# Patient Record
Sex: Female | Born: 1950 | Race: White | Hispanic: No | Marital: Married | Smoking: Former smoker
Health system: Southern US, Community
[De-identification: ages and names within clinical notes are randomized; demographics above are authoritative.]

## PROBLEM LIST (undated history)

## (undated) DIAGNOSIS — M519 Unspecified thoracic, thoracolumbar and lumbosacral intervertebral disc disorder: Secondary | ICD-10-CM

## (undated) DIAGNOSIS — E785 Hyperlipidemia, unspecified: Secondary | ICD-10-CM

## (undated) DIAGNOSIS — Z641 Problems related to multiparity: Secondary | ICD-10-CM

## (undated) DIAGNOSIS — Z87828 Personal history of other (healed) physical injury and trauma: Secondary | ICD-10-CM

## (undated) DIAGNOSIS — N289 Disorder of kidney and ureter, unspecified: Secondary | ICD-10-CM

## (undated) DIAGNOSIS — M629 Disorder of muscle, unspecified: Secondary | ICD-10-CM

## (undated) DIAGNOSIS — F3289 Other specified depressive episodes: Secondary | ICD-10-CM

## (undated) DIAGNOSIS — H9190 Unspecified hearing loss, unspecified ear: Secondary | ICD-10-CM

## (undated) DIAGNOSIS — C801 Malignant (primary) neoplasm, unspecified: Secondary | ICD-10-CM

## (undated) DIAGNOSIS — I10 Essential (primary) hypertension: Secondary | ICD-10-CM

## (undated) DIAGNOSIS — N939 Abnormal uterine and vaginal bleeding, unspecified: Secondary | ICD-10-CM

## (undated) DIAGNOSIS — T148XXA Other injury of unspecified body region, initial encounter: Secondary | ICD-10-CM

## (undated) DIAGNOSIS — K319 Disease of stomach and duodenum, unspecified: Secondary | ICD-10-CM

## (undated) DIAGNOSIS — M8430XA Stress fracture, unspecified site, initial encounter for fracture: Secondary | ICD-10-CM

## (undated) DIAGNOSIS — F411 Generalized anxiety disorder: Secondary | ICD-10-CM

## (undated) DIAGNOSIS — Z5189 Encounter for other specified aftercare: Secondary | ICD-10-CM

## (undated) DIAGNOSIS — M159 Polyosteoarthritis, unspecified: Secondary | ICD-10-CM

## (undated) DIAGNOSIS — M810 Age-related osteoporosis without current pathological fracture: Secondary | ICD-10-CM

## (undated) DIAGNOSIS — M2550 Pain in unspecified joint: Secondary | ICD-10-CM

## (undated) DIAGNOSIS — K759 Inflammatory liver disease, unspecified: Secondary | ICD-10-CM

## (undated) DIAGNOSIS — F419 Anxiety disorder, unspecified: Secondary | ICD-10-CM

## (undated) DIAGNOSIS — I519 Heart disease, unspecified: Secondary | ICD-10-CM

## (undated) DIAGNOSIS — M47816 Spondylosis without myelopathy or radiculopathy, lumbar region: Secondary | ICD-10-CM

## (undated) DIAGNOSIS — D0361 Melanoma in situ of right upper limb, including shoulder: Secondary | ICD-10-CM

## (undated) DIAGNOSIS — F329 Major depressive disorder, single episode, unspecified: Secondary | ICD-10-CM

## (undated) DIAGNOSIS — H269 Unspecified cataract: Secondary | ICD-10-CM

## (undated) DIAGNOSIS — C439 Malignant melanoma of skin, unspecified: Secondary | ICD-10-CM

## (undated) DIAGNOSIS — T7840XA Allergy, unspecified, initial encounter: Secondary | ICD-10-CM

## (undated) DIAGNOSIS — N6001 Solitary cyst of right breast: Secondary | ICD-10-CM

## (undated) DIAGNOSIS — K635 Polyp of colon: Secondary | ICD-10-CM

## (undated) DIAGNOSIS — F32A Depression, unspecified: Secondary | ICD-10-CM

## (undated) DIAGNOSIS — J432 Centrilobular emphysema: Secondary | ICD-10-CM

## (undated) DIAGNOSIS — G8929 Other chronic pain: Secondary | ICD-10-CM

## (undated) DIAGNOSIS — K760 Fatty (change of) liver, not elsewhere classified: Secondary | ICD-10-CM

## (undated) DIAGNOSIS — K834 Spasm of sphincter of Oddi: Secondary | ICD-10-CM

## (undated) DIAGNOSIS — C649 Malignant neoplasm of unspecified kidney, except renal pelvis: Secondary | ICD-10-CM

## (undated) DIAGNOSIS — M545 Low back pain, unspecified: Secondary | ICD-10-CM

## (undated) DIAGNOSIS — E119 Type 2 diabetes mellitus without complications: Secondary | ICD-10-CM

## (undated) DIAGNOSIS — C4490 Unspecified malignant neoplasm of skin, unspecified: Secondary | ICD-10-CM

## (undated) DIAGNOSIS — R928 Other abnormal and inconclusive findings on diagnostic imaging of breast: Secondary | ICD-10-CM

## (undated) DIAGNOSIS — J439 Emphysema, unspecified: Secondary | ICD-10-CM

## (undated) DIAGNOSIS — N183 Chronic kidney disease, stage 3 (moderate): Secondary | ICD-10-CM

## (undated) HISTORY — DX: Anxiety disorder, unspecified: F41.9

## (undated) HISTORY — PX: ANKLE SURGERY: SHX546

## (undated) HISTORY — DX: Chronic kidney disease, stage 3 (moderate): N18.3

## (undated) HISTORY — DX: Polyp of colon: K63.5

## (undated) HISTORY — DX: Malignant melanoma of skin, unspecified: C43.9

## (undated) HISTORY — DX: Low back pain, unspecified: M54.50

## (undated) HISTORY — DX: Age-related osteoporosis without current pathological fracture: M81.0

## (undated) HISTORY — DX: Spasm of sphincter of Oddi: K83.4

## (undated) HISTORY — DX: Type 2 diabetes mellitus without complications: E11.9

## (undated) HISTORY — DX: Malignant neoplasm of unspecified kidney, except renal pelvis: C64.9

## (undated) HISTORY — DX: Encounter for other specified aftercare: Z51.89

## (undated) HISTORY — DX: Other chronic pain: G89.29

## (undated) HISTORY — DX: Major depressive disorder, single episode, unspecified: F32.9

## (undated) HISTORY — DX: Spondylosis without myelopathy or radiculopathy, lumbar region: M47.816

## (undated) HISTORY — PX: UPPER GASTROINTESTINAL ENDOSCOPY: SHX188

## (undated) HISTORY — DX: Allergy, unspecified, initial encounter: T78.40XA

## (undated) HISTORY — PX: PARTIAL NEPHRECTOMY: SHX414

## (undated) HISTORY — DX: Unspecified malignant neoplasm of skin, unspecified: C44.90

## (undated) HISTORY — PX: ABDOMINAL HYSTERECTOMY: SHX81

## (undated) HISTORY — PX: COLONOSCOPY: SHX174

## (undated) HISTORY — DX: Other abnormal and inconclusive findings on diagnostic imaging of breast: R92.8

## (undated) HISTORY — DX: Fatty (change of) liver, not elsewhere classified: K76.0

## (undated) HISTORY — DX: Essential (primary) hypertension: I10

## (undated) HISTORY — PX: POLYPECTOMY: SHX149

## (undated) HISTORY — DX: Depression, unspecified: F32.A

## (undated) HISTORY — DX: Emphysema, unspecified: J43.9

## (undated) HISTORY — DX: Unspecified cataract: H26.9

## (undated) HISTORY — DX: Melanoma in situ of right upper limb, including shoulder: D03.61

## (undated) HISTORY — PX: FRACTURE SURGERY: SHX138

## (undated) HISTORY — PX: CARDIAC CATHETERIZATION: SHX172

## (undated) HISTORY — DX: Low back pain: M54.5

## (undated) HISTORY — DX: Centrilobular emphysema: J43.2

## (undated) HISTORY — PX: BILE DUCT STENT PLACEMENT: SHX1227

## (undated) HISTORY — PX: SPINE SURGERY: SHX786

## (undated) HISTORY — PX: CHOLECYSTECTOMY: SHX55

## (undated) HISTORY — PX: JOINT REPLACEMENT: SHX530

## (undated) HISTORY — PX: MOHS SURGERY: SUR867

## (undated) HISTORY — PX: LUMBAR FUSION: SHX111

## (undated) HISTORY — DX: Generalized anxiety disorder: F41.1

## (undated) HISTORY — PX: PR CHOLECYSTECTOMY: 47600

## (undated) HISTORY — DX: Disorder of muscle, unspecified: M62.9

## (undated) HISTORY — DX: Abnormal uterine and vaginal bleeding, unspecified: N93.9

## (undated) HISTORY — PX: PR UNLISTED PROCEDURE SPINE: 22899

## (undated) HISTORY — DX: Unspecified hearing loss, unspecified ear: H91.90

## (undated) HISTORY — DX: Hyperlipidemia, unspecified: E78.5

## (undated) HISTORY — PX: PR UNLISTED PROCEDURE LEG/ANKLE: 27899

## (undated) HISTORY — DX: Problems related to multiparity: Z64.1

## (undated) HISTORY — DX: Pain in unspecified joint: M25.50

## (undated) HISTORY — DX: Other specified depressive episodes: F32.89

## (undated) HISTORY — DX: Unspecified thoracic, thoracolumbar and lumbosacral intervertebral disc disorder: M51.9

## (undated) HISTORY — DX: Other injury of unspecified body region, initial encounter: T14.8XXA

## (undated) HISTORY — DX: Stress fracture, unspecified site, initial encounter for fracture: M84.30XA

## (undated) HISTORY — DX: Disorder of kidney and ureter, unspecified: N28.9

## (undated) HISTORY — DX: Inflammatory liver disease, unspecified: K75.9

## (undated) HISTORY — DX: Disease of stomach and duodenum, unspecified: K31.9

## (undated) HISTORY — DX: Personal history of other (healed) physical injury and trauma: Z87.828

## (undated) HISTORY — PX: MOHS ANY STAGE > 5 SPEC EACH: 17310

## (undated) HISTORY — DX: Polyosteoarthritis, unspecified: M15.9

## (undated) HISTORY — DX: Malignant (primary) neoplasm, unspecified: C80.1

## (undated) MED ORDER — TRAZODONE HCL 50 MG OR TABS
ORAL_TABLET | ORAL | Status: AC
Start: 2017-11-16 — End: ?

## (undated) MED ORDER — ALLOPURINOL 100 MG OR TABS
ORAL_TABLET | ORAL | Status: AC
Start: 2014-07-02 — End: ?

## (undated) MED ORDER — ENALAPRIL MALEATE 10 MG OR TABS
ORAL_TABLET | ORAL | Status: AC
Start: 2015-01-23 — End: ?

## (undated) MED ORDER — VENLAFAXINE HCL ER 75 MG OR CP24
EXTENDED_RELEASE_CAPSULE | ORAL | Status: AC
Start: 2015-01-23 — End: ?

## (undated) MED ORDER — BUPROPION HCL ER (SR) 150 MG OR TB12
EXTENDED_RELEASE_TABLET | ORAL | Status: AC
Start: 2014-07-02 — End: ?

## (undated) MED ORDER — BUPROPION HCL ER (SR) 150 MG OR TB12
EXTENDED_RELEASE_TABLET | ORAL | Status: AC
Start: 2015-01-23 — End: ?

## (undated) MED ORDER — SIMVASTATIN 40 MG OR TABS
ORAL_TABLET | ORAL | Status: AC
Start: 2015-01-23 — End: ?

## (undated) MED ORDER — ALLOPURINOL 100 MG OR TABS
ORAL_TABLET | ORAL | Status: AC
Start: 2015-01-23 — End: ?

## (undated) MED ORDER — TRAMADOL HCL 50 MG OR TABS
50.0000 mg | ORAL_TABLET | Freq: Four times a day (QID) | ORAL | Status: AC | PRN
Start: 2014-12-03 — End: ?

---
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
## 1994-12-21 DIAGNOSIS — D259 Leiomyoma of uterus, unspecified: Secondary | ICD-10-CM

## 1994-12-21 HISTORY — DX: Leiomyoma of uterus, unspecified: D25.9

## 1996-12-21 HISTORY — PX: PR TOTAL ABDOMINAL HYSTERECT W/WO RMVL TUBE OVARY: 58150

## 2002-12-21 HISTORY — PX: BACK SURGERY: SHX5067

## 2006-12-21 DIAGNOSIS — G4730 Sleep apnea, unspecified: Secondary | ICD-10-CM

## 2006-12-21 HISTORY — DX: Sleep apnea, unspecified: G47.30

## 2010-08-10 ENCOUNTER — Other Ambulatory Visit: Payer: Self-pay

## 2010-08-17 ENCOUNTER — Other Ambulatory Visit: Payer: Self-pay

## 2011-05-19 ENCOUNTER — Other Ambulatory Visit (INDEPENDENT_AMBULATORY_CARE_PROVIDER_SITE_OTHER): Payer: Self-pay | Admitting: Family Medicine

## 2011-05-20 LAB — PATHOLOGY, SURGICAL

## 2012-03-29 ENCOUNTER — Ambulatory Visit (INDEPENDENT_AMBULATORY_CARE_PROVIDER_SITE_OTHER): Payer: PRIVATE HEALTH INSURANCE | Admitting: Cardiovascular Disease

## 2012-03-29 ENCOUNTER — Encounter (INDEPENDENT_AMBULATORY_CARE_PROVIDER_SITE_OTHER): Payer: Self-pay | Admitting: Cardiovascular Disease

## 2012-03-29 ENCOUNTER — Other Ambulatory Visit: Payer: Self-pay

## 2012-03-29 VITALS — BP 126/74 | HR 76 | Ht 70.0 in | Wt 262.0 lb

## 2012-03-29 DIAGNOSIS — R002 Palpitations: Secondary | ICD-10-CM | POA: Insufficient documentation

## 2012-03-29 DIAGNOSIS — Z87891 Personal history of nicotine dependence: Secondary | ICD-10-CM | POA: Insufficient documentation

## 2012-03-29 DIAGNOSIS — I10 Essential (primary) hypertension: Secondary | ICD-10-CM | POA: Insufficient documentation

## 2012-03-29 DIAGNOSIS — E785 Hyperlipidemia, unspecified: Secondary | ICD-10-CM | POA: Insufficient documentation

## 2012-03-29 DIAGNOSIS — M109 Gout, unspecified: Secondary | ICD-10-CM | POA: Insufficient documentation

## 2012-03-29 DIAGNOSIS — G4733 Obstructive sleep apnea (adult) (pediatric): Secondary | ICD-10-CM | POA: Insufficient documentation

## 2012-03-29 NOTE — Progress Notes (Signed)
Cardiovascular Clinical Evaluation Note    Primary Care Provider: Rothmier, Lisbeth Ply    DOB:  August 15, 1951    CHIEF COMPLAINT: palpitations    HISTORY OF PRESENT ILLNESS:    This is a 61 year old female with multiple complaints today.    Her most recent concern has been daily palpitations. She has not noted sustained spells but they have become more frequent in the last few weeks. There is no syncope or near syncope.  She has no documentation of prior arrhythmia by any particular means.  She has not had any strokes or TIA's.  She reports her stress level is quite high with 2 grown sons in addiction treatment in [place] and raising her 69 year old daughter.  She does not drink alcohol.    Her other complaints are exertional dyspnea. She has finally stopped smoking after 40 years, but has also gained 60 pounds. She is quite winded going up stairs, although denies any chest pain.  She has no documented COPD, but has never had pulmonary testing.  She has severe obstructive sleep apnea and is on chronic CPAP for this.    Her outside labs are reviewed and demonstrate some glucose intolerance, fairly well controlled lipids and normal TSH.    The patient had been seen pre-operatively by Dr. Anner Crete for palpitations in 2009, had a normal echo and was not seen back for review of symptoms.    Cardiac Risk Factors:  treated hypertension; prediabetes; former smoker; treated hyperlipidemia; overweight; no premature family history of coronary artery disease;      Patient Active Problem List    Diagnosis Date Noted   . Palpitations 03/29/2012   . Hypertension 03/29/2012   . Hyperlipidemia 03/29/2012       MEDICATIONS:    No current outpatient prescriptions on file.       ALLERGIES:  Review of patient's allergies indicates no known allergies.      PSFH: originally from [place], relocating to [place] for many years, moving back here. Now retired. Non drinker. Remarried her high school sweetheart. Her father was the late [employer]  form [place] football.  She lost both parents int he last 2 years. Her father died of CHF, and dementia, mother died of pneumonia and renal failure.  She is quite stressed out at having to deal with their estates, etc. As well.    REVIEW OF SYSTEMS   CONSTITUTIONAL: No weight loss, fever.  CARDIOVASCULAR:  See HPI.  GI:  No melena, hematochezia, hematemesis.  GU:  No dysuria, hematuria.  NEURO:  No TIA or CVA symptoms.      PHYSICAL EXAM   CONSTITUTIONAL: This is a well-nourished female  in no distress.    BP 126/74  Pulse 76  Ht 5\' 10"  (1.778 m)  Wt 262 lb (118.842 kg)  BMI 37.59 kg/m2     NEURO/PSYCH:  Oriented x 3 with normal affect.Thyroid is not enlarged.  RESPIRATORY:  Respiratory effort is normal.  Lungs are clear to percussion/auscultation. CARDIOVASCULAR: PMI at 5th ICS/MCL.  Regular rate and rhythm.  S1, S2 normal.  No S3, S4, no murmur. Jugular venous pressure is normal.  Carotid upstrokes are brisk without bruits. Radial and brachial pulses are symmetric and normal.  Pedal pulses normal bilaterally.  Extremities no edema.  GASTROINTESTINAL: Abdomen without masses or tenderness.  No hepatosplenomegaly.  EXTREMITIES: No clubbing or cyanosis.   EKG:  NSR no ectopy  IMPRESSION:     1. Dyspnea with exertion. This  could be due to many things, but given her risk factors for CAD, this should be further assessed. She will need to start an exercise program to drop this extra weight, and must be safe to do so.  Other possibilities include excessive weight or COPD.  She will probably require PFT's at some point.  Given her sleep apnea, pulmonary hypertension is also a possibility.  2. Palpitations.  PVC's or PAC's versus atrial fibrillation for which she is also at risk.  3. Overweight syndrome.  4. Former heavy smoker, now abstinent but with significant weight gain.  5. Controlled lipids (for now)  6. Pre-diabetes.    PLAN:   1. Holter  2. Echo to assess for valve disease and pulmonary hypertension.  3. Lexiscan as  I do not think she will be able to exercise for a diagnostic result  4. See me afterwards.    [name] P. Leia Alf, MD  CC:  Rothmier, Lisbeth Ply

## 2012-03-30 NOTE — Addendum Note (Signed)
Addended by: Maia Breslow on: 03/30/2012 09:48 AM     Modules accepted: Orders

## 2012-03-30 NOTE — Progress Notes (Signed)
[facility]

## 2012-04-11 ENCOUNTER — Ambulatory Visit (HOSPITAL_BASED_OUTPATIENT_CLINIC_OR_DEPARTMENT_OTHER): Payer: PRIVATE HEALTH INSURANCE

## 2012-04-11 ENCOUNTER — Ambulatory Visit (HOSPITAL_BASED_OUTPATIENT_CLINIC_OR_DEPARTMENT_OTHER): Payer: PRIVATE HEALTH INSURANCE | Admitting: Cardiology

## 2012-04-11 VITALS — BP 132/72 | HR 69

## 2012-04-11 NOTE — Progress Notes (Signed)
Please see attached letter for myocardial perfusion imaging report

## 2012-04-11 NOTE — Progress Notes (Signed)
Echo completed.  Brenda Krauth, RDCS

## 2012-04-19 NOTE — Progress Notes (Signed)
Cardiovascular Clinical Evaluation Note    Primary Care Provider: Rothmeier, Lisbeth Ply    DOB:  May 15, 1951    CHIEF COMPLAINT: routine    HISTORY OF PRESENT ILLNESS:    The patient's echo showed normal LF function, and mild LVH. Her perfusion scan was negative for ischemia.  The results were shared with her.  Her symptoms have actually gotten somewhat better.   She and I discussed a program of exercise and incorporating Weight Watchers.  We also discussed that if dyspnea continues, she should have PFT's performed.    PROBLEM LIST:  Patient Active Problem List    Diagnosis Date Noted   . Palpitations 03/29/2012   . Hypertension 03/29/2012   . Hyperlipidemia 03/29/2012   . Gout 03/29/2012   . Former smoker 03/29/2012     A. 40 pack years     . Sleep apnea 03/29/2012     A. On CPAP chronically ( 20 mmHg)         MEDICATIONS:    Current Outpatient Prescriptions   Medication Sig   . Allopurinol 100 MG Oral Tab Take 1 tablet Daily   . ALPRAZolam 0.5 MG Oral Tab Take 1-2 tablets daily   . Aspirin 81 MG Oral Tab 1 TABLET DAILY   . Cholecalciferol (VITAMIN D3) 2000 UNITS Oral Tab Take 1 tablet Daily   . Citalopram Hydrobromide 20 MG Oral Tab Take 1 tablet Daily   . Diclofenac Sodium CR 100 MG Oral TABLET SR 24 HR Take 1 tablet Daily PRN   . Enalapril Maleate 10 MG Oral Tab Take 1/2 tablet Daily   . ESTROGEL 0.75 MG/1.25 GM (0.06%) Transdermal Gel Apply Daily   . Fiber Oral Cap Take 1 cap Daily   . Fluticasone Propionate 50 MCG/ACT Nasal Suspension Spray in each nostril daily   . Multiple Vitamin Oral Tab Take 1 tablet Daily   . Nutritional Supplements (ESTROVEN OR) daily.   . Omega-3 Fatty Acids (FISH OIL) 1000 MG Oral Cap Take 1 cap Daily   . Simvastatin 40 MG Oral Tab Take 1/2 tablet Daily   . TraZODone HCl 50 MG Oral Tab Take 1 tablet Daily PRN   . Triamterene-HCTZ 37.5-25 MG Oral Tab Take 1 tablet Daily   . Vitamin E 400 UNITS Oral Tab Take 1 tablet Daily       ALLERGIES:  Review of patient's allergies indicates no  known allergies.      PSFH: no changes    REVIEW OF SYSTEMS   CONSTITUTIONAL: No weight loss, fever.  CARDIOVASCULAR:  See HPI.  GI:  No melena, hematochezia, hematemesis.  GU:  No dysuria, hematuria.  NEURO:  No TIA or CVA symptoms.      PHYSICAL EXAM   CONSTITUTIONAL: This is a well-nourished female  in no distress.    BP 120/70  Pulse 72  Ht 5\' 10"  (1.778 m)  Wt 266 lb (120.657 kg)  BMI 38.17 kg/m2     Physical examination was deferred due to time spent counseling.     IMPRESSION:   1. Palpitations which have largely resolved. Given her structurally normal heart, these are likely benign.  2. Former smoking history and dyspnea. If ongoing symptoms, PFT's.  3. Overweight syndrome. See above.    PLAN:   RTc prn    Kandra Nicolas. Leia Alf, MD  CC:   Rothmeier, Lisbeth Ply

## 2012-04-20 ENCOUNTER — Encounter (INDEPENDENT_AMBULATORY_CARE_PROVIDER_SITE_OTHER): Payer: Self-pay | Admitting: Cardiovascular Disease

## 2012-04-20 ENCOUNTER — Ambulatory Visit (INDEPENDENT_AMBULATORY_CARE_PROVIDER_SITE_OTHER): Payer: PRIVATE HEALTH INSURANCE | Admitting: Cardiovascular Disease

## 2012-04-20 VITALS — BP 120/70 | HR 72 | Ht 70.0 in | Wt 266.0 lb

## 2012-04-20 DIAGNOSIS — R0609 Other forms of dyspnea: Secondary | ICD-10-CM | POA: Insufficient documentation

## 2013-07-03 ENCOUNTER — Other Ambulatory Visit (INDEPENDENT_AMBULATORY_CARE_PROVIDER_SITE_OTHER): Payer: Self-pay | Admitting: Family Medicine

## 2013-07-03 MED ORDER — SIMVASTATIN 40 MG OR TABS
ORAL_TABLET | ORAL | Status: DC
Start: 2013-07-03 — End: 2014-04-16

## 2013-07-03 NOTE — Telephone Encounter (Signed)
Refill per pharmacy request.  Simvastatin 40mg  #45 (HLD)  Last seen 12/12/12 for depression  Last labs for cholesterol 10/2012 (with CPE).  Last fill 06/2012?

## 2013-08-28 ENCOUNTER — Other Ambulatory Visit (INDEPENDENT_AMBULATORY_CARE_PROVIDER_SITE_OTHER): Payer: Self-pay | Admitting: Family Medicine

## 2013-08-28 NOTE — Telephone Encounter (Signed)
Refill per pharmacy request.  Allopurinol 100 mg  Alprazolam 0.5 mg    Last seen 12/12/12 for depression; 10/24/12 for CPE  Last uric acid labs 12/12/12 (recheck 6-12 months)

## 2013-08-29 MED ORDER — ALLOPURINOL 100 MG OR TABS
100.0000 mg | ORAL_TABLET | Freq: Every day | ORAL | Status: DC
Start: 2013-08-28 — End: 2013-11-05

## 2013-08-29 MED ORDER — ALPRAZOLAM 0.5 MG OR TABS
0.2500 mg | ORAL_TABLET | Freq: Two times a day (BID) | ORAL | Status: DC | PRN
Start: 2013-08-28 — End: 2014-03-24

## 2013-08-29 NOTE — Telephone Encounter (Signed)
Please call in alprazolam.  Patient needs follow up appointment to discuss anxiety and gout.

## 2013-08-29 NOTE — Telephone Encounter (Signed)
Rx alprazolam 0.5 mg #10+1 called in to [facility], as ordered.      Called patient and discussed that she is due to follow up for her anxiety and gout, or she is due for her CPE beg of November and she can schedule this as well.  Patient agreed and will call back to schedule an appt soon.

## 2013-10-04 ENCOUNTER — Other Ambulatory Visit (INDEPENDENT_AMBULATORY_CARE_PROVIDER_SITE_OTHER): Payer: Self-pay | Admitting: Family Medicine

## 2013-10-04 ENCOUNTER — Other Ambulatory Visit: Payer: Self-pay | Admitting: Family Medicine

## 2013-10-04 ENCOUNTER — Ambulatory Visit (INDEPENDENT_AMBULATORY_CARE_PROVIDER_SITE_OTHER): Payer: PRIVATE HEALTH INSURANCE | Admitting: Family Medicine

## 2013-10-04 ENCOUNTER — Encounter (INDEPENDENT_AMBULATORY_CARE_PROVIDER_SITE_OTHER): Payer: Self-pay | Admitting: Family Medicine

## 2013-10-04 VITALS — BP 120/84 | HR 100 | Temp 98.2°F | Ht 70.0 in | Wt 270.0 lb

## 2013-10-04 DIAGNOSIS — F329 Major depressive disorder, single episode, unspecified: Secondary | ICD-10-CM | POA: Insufficient documentation

## 2013-10-04 DIAGNOSIS — M5136 Other intervertebral disc degeneration, lumbar region: Secondary | ICD-10-CM | POA: Insufficient documentation

## 2013-10-04 DIAGNOSIS — C4491 Basal cell carcinoma of skin, unspecified: Secondary | ICD-10-CM | POA: Insufficient documentation

## 2013-10-04 DIAGNOSIS — IMO0002 Reserved for concepts with insufficient information to code with codable children: Secondary | ICD-10-CM | POA: Insufficient documentation

## 2013-10-04 LAB — THYROID STIMULATING HORMONE: Thyroid Stimulating Hormone: 2.378 (ref 0.400–5.000)

## 2013-10-04 LAB — LIPID PANEL
Cholesterol (LDL): 109 (ref <130)
Cholesterol/HDL Ratio: 3.5
HDL Cholesterol: 58 (ref >40)
Non-HDL Cholesterol: 147 (ref 0–159)
Total Cholesterol: 205 — ABNORMAL HIGH (ref <200)
Triglyceride: 190 — ABNORMAL HIGH (ref <150)

## 2013-10-04 LAB — URINALYSIS COMPLETE, URN
Bacteria, URN: NONE SEEN
Bilirubin (Qual), URN: NEGATIVE
Epith Cells_Renal/Trans,URN: NEGATIVE
Epith Cells_Squamous, URN: NEGATIVE
Glucose Qual, URN: NEGATIVE
Ketones, URN: NEGATIVE
Leukocyte Esterase, URN: NEGATIVE
Nitrite, URN: NEGATIVE
Occult Blood, URN: NEGATIVE
Protein (Alb Semiquant), URN: NEGATIVE
RBC, URN: NEGATIVE
Specific Gravity, URN: 1.019 (ref 1.002–1.027)
WBC, URN: NEGATIVE
pH, URN: 6 (ref 5.0–8.0)

## 2013-10-04 LAB — COMPREHENSIVE METABOLIC PANEL
ALT (GPT): 21 (ref 6–40)
AST (GOT): 18 (ref 15–40)
Albumin: 3.8 (ref 3.5–5.2)
Alkaline Phosphatase (Total): 76 (ref 31–132)
Anion Gap: 9 (ref 3–11)
Bilirubin (Total): 0.6 (ref 0.2–1.3)
Calcium: 9.7 (ref 8.9–10.2)
Carbon Dioxide, Total: 27 (ref 22–32)
Chloride: 103 (ref 98–108)
Creatinine: 1.24 — ABNORMAL HIGH (ref 0.38–1.02)
GFR, Calc, African American: 53 — ABNORMAL LOW (ref >59)
GFR, Calc, European American: 44 — ABNORMAL LOW (ref >59)
Glucose: 100 (ref 62–125)
Potassium: 4.4 (ref 3.7–5.2)
Protein (Total): 6.7 (ref 6.0–8.2)
Sodium: 139 (ref 136–145)
Urea Nitrogen: 22 — ABNORMAL HIGH (ref 8–21)

## 2013-10-04 LAB — CBC, DIFF
% Basophils: 0
% Eosinophils: 5
% Immature Granulocytes: 1
% Lymphocytes: 23
% Monocytes: 7
% Neutrophils: 64
Absolute Eosinophil Count: 0.31 (ref 0.00–0.50)
Absolute Lymphocyte Count: 1.44 (ref 1.00–4.80)
Basophils: 0.02 (ref 0.00–0.20)
Hematocrit: 39 (ref 36–45)
Hemoglobin: 12.6 (ref 11.5–15.5)
Immature Granulocytes: 0.03 (ref 0.00–0.05)
MCH: 30.4 (ref 27.3–33.6)
MCHC: 32.5 (ref 32.2–36.5)
MCV: 94 (ref 81–98)
Monocytes: 0.42 (ref 0.00–0.80)
Neutrophils: 4.02 (ref 1.80–7.00)
Platelet Count: 320 (ref 150–400)
RBC: 4.14 (ref 3.80–5.00)
RDW-CV: 13.5 (ref 11.6–14.4)
WBC: 6.24 (ref 4.3–10.0)

## 2013-10-04 LAB — T4, FREE: Thyroxine (Free): 0.6 (ref 0.6–1.2)

## 2013-10-04 LAB — URIC ACID, SERUM: Uric Acid: 6.7 — ABNORMAL HIGH (ref 2.9–6.3)

## 2013-10-04 LAB — BILIRUBIN (DIRECT): Bilirubin (Direct): <0.1 (ref 0.0–0.3)

## 2013-10-04 MED ORDER — TRAMADOL HCL 50 MG OR TABS
50.0000 mg | ORAL_TABLET | Freq: Four times a day (QID) | ORAL | Status: DC | PRN
Start: 2013-10-04 — End: 2014-07-26

## 2013-10-04 NOTE — Progress Notes (Addendum)
Lindsey [name] is a 62 year old female here today for a preventive health visit. Other problems or concerns today:     1.  Depression.  She was seeing a Veterinary surgeon at [facility] a few times.  She was then referred to an Danise Mina ARNP.  She is now on Buproprion and Effexor and feels that she is doing well.  She denies SI, HI.  She occasionally uses Alprazolam for anxiety.    PATIENT HEALTH QUESTIONNAIRE (PHQ-9)  Over the last 2 weeks, how often have you been bothered by any of the following problems?    1. Little interest or pleasure in doing things: not at all (0)  2. Feeling down, depressed, or hopeless: not at all (0)  3. Trouble falling or staying asleep,or sleeping too much: not at all (0)  4. Feeling tired or having little energy: several days  (1)  5. Poor appetite or overeating: several days  (1)  6. Feeling bad about yourself-or that you are a failure or have let yourself or your family down: not at all (0)  7. Trouble concentrating on things, such as reading the newspaper or watching television: not at all (0)  8. Moving or speaking so slowly that other people could have noticed. Or the opposite-being so fidgety or restless that you have been moving around a lot  more than usual: not at all (0)  9. Thoughts that you would be better off dead, or of hurting yourself in some way: not at all (0)    Total Score Depression Severity  1-4 Minimal depression  5-9 Mild depression  10-14 Moderate depression  15-19 Moderately severe depression  20-27 Severe depression    10. If you checked off any problems, how difficult have these problems made it for  you to do your work, take care of things at home, or get along with other people? Not difficult at all   ---------------------------------------------------------------------------------------------------------  PHQ-9 is adapted from [employer], developed by Drs Molly Maduro L. Spitzer, Nance Pear, Tora Perches, and colleagues, with  an educational grant from [employer]. For research information, contact Dr Everlene Other at [email or web address]. Use of the PHQ-9 may only be made in accordance with the Terms of Use available at This[employer]com.ee. Copyright [employer]. All rights reserved. [employer] is a Child psychotherapist [employer].      2.  Back pain, knee pain and weight gain.  She is unable to exercise due to pain in her back and knees.  She likes to walk, but it has become an issue as of late.  Walking hurts her back, and cycling bothers her knee.  She has been taking diclofenac and on occasion she has been using tylenol and ibuprofen.  It does not always help.  She states the pain does keep her from doing activities.    No current GYN:    CANCER SCREENING  Family history of colon cancer: NO  Family history of uterine or ovarian cancer: NO  Family history of breast cancer: NO  Prior mammogram: YES 2013  History of abnormal mammogram: NO    LIFESTYLE  Current dietary habits: meals per day on average: 3 plus snacks  Calcium: calcium supplements:  YES  Current exercise habits: gardening and housecleaning  Regular seat belt use: YES  Substance use:  reports that she quit smoking about 2 years ago. Her smoking use included Cigarettes. She has a 40 pack-year smoking history. She has never used smokeless  tobacco. She reports that  drinks alcohol. She reports that she does not use illicit drugs.   Exposure to hazardous materials: No  Guns in the house: Yes: Stored in cases    History sections of chart reviewed and updated today: YES    Review Of Systems:  Constitutional: Denies, fatigue, unexpected weight loss, unexpected weight gain, fever, chills, night sweats  Regular dental exams: yes  Eye: Denies, blurry vision, diplopia, eye pain, .  Does have, blurry vision in right eye  ENT: Denies, hearing difficulties, nasal congestion and any ear nose or throat problems  Respiratory: Denies, dyspnea on exertion, dyspnea at rest, cough,  wheezing  Cardiovascular: Denies, chest pain or pressure at rest or during exercise, palpitations, .  Does have THROAT CLEARING  GI: Denies, change in appetite, dysphagia, nausea, vomiting, dyspepsia, abdominal pain, diarrhea, constipation, hematochezia, melena  GU: Denies, dysuria, hematuria, urinary incontinence, urinary urgency, vaginal discharge, abnormal vaginal bleeding, pelvic pain  Derm: Denies, skin rash, easy bruising, hair loss, changes in moles or new moles  Rheumatologic/Musculoskeletal: Denies, any joint or arthritic problems, arthralgias (back and knee), joint stiffness (knee and back)  Endocrine: Denies, polyuria, polydipsia, heat intolerance, cold intolerance, prior blood sugar problems, prior thyroid problems  Neurologic: Denies, headaches and tremors  Psych: Denies, depressed mood, and anxiety does have sleep disturbance       EXAM:  General: healthy, alert, no distress, cooperative  Skin: Skin color, texture, turgor normal. No rashes or concerning lesions  Head: Normocephalic. No masses, lesions, tenderness or abnormalities  Eyes: Lids/periorbital skin normal, Conjunctivae/corneas clear, PERRL, EOM's intact  Neck: supple. No adenopathy. Thyroid symmetric, normal size, without nodules  Lungs: clear to auscultation  Heart: normal rate, regular rhythm and no murmurs, clicks, or gallops  Breasts: not done: discussed recommendations for age, patient does BSE, patient declined  Abd: soft, non-tender. BS normal. No masses or organomegaly  GU: deferred  Rectal: deferred  Extremities: Normal, without deformities, edema, or skin discoloration, radial and DP pulses 2+ bilaterally  Neuro:  Grossly normal to observation, gait normal      ASSESSMENT:    62 yo female with:  Patient Active Problem List   Diagnosis   . Palpitations   . Hypertension   . Hyperlipidemia   . Gout   . Former smoker   . Sleep apnea   . Shortness of breath   . Basal cell carcinoma   . Squamous cell carcinoma   . Depression, major          PLAN:    Check labs  Zoster is recommended (at [facility] or [facility])  Get release from psychiatric ARNP so I can take over medication.  Increase exercise (consider jazzercise)  Colonoscopy in 1 year.  Add tramadol PRN low back pain  Establish with GYN, Dr. Sherlon Handing??  F/U 1 year.    Health Maintenance   Topic Date Due   . Pertussis Vaccine >11 Yrs  06/06/1962   . Tetanus Booster  06/07/1963   . Pap Smear 21-64 Years  06/06/1972   . Cholesterol-female  06/06/1996   . Colon Cancer Screening,fobt  06/06/2001   . Mammogram 50+  06/06/2001   . Zoster Vaccine  06/07/2011   . Influenza >9 Yrs  09/20/2013     Immunizations due: Zoster   Screening tests discussed: Diabetes (FBS if BP > 135/80) and Lipids  History sections of chart reviewed and updated today: YES    No diagnosis found.    The following were reviewed  with patient:  Skin cancer prevention and self-exam and Vitamin D  Patient information given for the following: none    Follow up: 1 year  Lab tests and results of any diagnostic studies will be shared with the patient by  Fernanda Drum was seen today for a total of 15 minutes in addition to her preventative health exam.  Greater than 50% of this time was spent in face to face time, discussing her diagnosis, counseling and coordination of care.

## 2013-10-04 NOTE — Telephone Encounter (Signed)
I am aware.  Please limit dose to 50 mg Q 6 hours.  Please contact pharmacy.

## 2013-10-04 NOTE — Telephone Encounter (Signed)
Rec'd fax from [facility] [phone]/ fax: [phone] that patient is on venlafaxine and there is risk of serotonin syndrome with tramadol.  They want verification that Dr. Julian Reil is aware and if it is still ok to fill.

## 2013-10-05 ENCOUNTER — Other Ambulatory Visit (INDEPENDENT_AMBULATORY_CARE_PROVIDER_SITE_OTHER): Payer: Self-pay | Admitting: Family Medicine

## 2013-10-05 LAB — HEMOGLOBIN A1C, HPLC: Hemoglobin A1C: 5.8 (ref 4.0–6.0)

## 2013-10-05 MED ORDER — TRIAMTERENE-HCTZ 37.5-25 MG OR TABS
1.0000 | ORAL_TABLET | Freq: Every day | ORAL | Status: DC
Start: 2013-10-05 — End: 2014-04-16

## 2013-10-05 NOTE — Telephone Encounter (Signed)
Fax reply sent to [facility] with Dr. [place] Service instructions.  Also notified patient to limit tramadol to 50 mg every 6 hours.  She reported understanding.

## 2013-10-05 NOTE — Telephone Encounter (Signed)
Refill per pharmacy request.  Triamterene/HCTZ 37.5/25 mg--90 day supply    Last seen 10/04/13 for CPE

## 2013-10-06 ENCOUNTER — Encounter (INDEPENDENT_AMBULATORY_CARE_PROVIDER_SITE_OTHER): Payer: Self-pay | Admitting: Gynecology

## 2013-10-09 ENCOUNTER — Encounter (INDEPENDENT_AMBULATORY_CARE_PROVIDER_SITE_OTHER): Payer: Self-pay | Admitting: Family Medicine

## 2013-10-10 MED ORDER — VENLAFAXINE HCL ER 75 MG OR CP24
75.0000 mg | EXTENDED_RELEASE_CAPSULE | Freq: Every day | ORAL | Status: DC
Start: 2013-10-10 — End: 2014-02-06

## 2013-10-10 MED ORDER — BUPROPION HCL ER (SR) 150 MG OR TB12
150.0000 mg | EXTENDED_RELEASE_TABLET | Freq: Two times a day (BID) | ORAL | Status: DC
Start: 2013-10-10 — End: 2014-02-06

## 2013-10-31 ENCOUNTER — Encounter (INDEPENDENT_AMBULATORY_CARE_PROVIDER_SITE_OTHER): Payer: Self-pay | Admitting: Gynecology

## 2013-10-31 ENCOUNTER — Ambulatory Visit (INDEPENDENT_AMBULATORY_CARE_PROVIDER_SITE_OTHER): Payer: PRIVATE HEALTH INSURANCE | Admitting: Gynecology

## 2013-10-31 VITALS — BP 120/60 | HR 76 | Ht 70.0 in | Wt 270.0 lb

## 2013-10-31 MED ORDER — ESTRADIOL 0.0375 MG/24HR TD PTTW
1.0000 | MEDICATED_PATCH | TRANSDERMAL | Status: DC
Start: 2013-11-02 — End: 2014-11-23

## 2013-10-31 NOTE — Progress Notes (Signed)
Lindsey [name] is a 62 year old female here today for a preventive health visit. Other problems or concerns today: she wonders about type of ERT use/costs.  Lindsey [name] had a TAHBSO for fibroids with acute bleeding requiring transfusion ( in [place]). She began on ERT a few months later for vasomotor symptoms. When she saw [name] for  Gyn exam in 2011 she switched from oral synthetic cenestin to transdermal estradiol ( estrogel). She gets good symptom relief from this and actually does not use a full pump or use it daily ( forgets a few times per week). It is more costly than oral therapy now for her and she asks about perhaps resuming the other med.  She acknowledges significant weight gain since quitting smoking in 2012.    GYN HISTORY  Obstetric History    G2   P2   T0   P0   A0   TAB0   SAB0   E0   M0   L0      No LMP recorded. Patient has had a hysterectomy.   Menses: no longer menstruating  Menopausal symptoms: none  Incontinence: No  Last pap: hyst  Pap history: no history of abnormal paps  Contraception: NOT INDICATED  History of HRT: YES  Other gyn history: as above    SEXUAL HISTORY  Sexual activity: yes, single partner, contraception not required  HIV risk: Denies, multiple sexual partners, history of a sexually transmitted disease, transfusion between 1978 and 1985 and use of illicit drugs by injection  Sexual concerns: No  History of sexual or physical abuse: No  Have you been hit, kicked, punched, or otherwise hurt by someone within the past year: No    CANCER SCREENING  Family history of colon cancer: NO  Family history of uterine or ovarian cancer: NO  Family history of breast cancer: NO  Prior mammogram: YES 11/13  History of abnormal mammogram: NO    LIFESTYLE  Current dietary habits: "not great"  Calcium: dietary sources only  Current exercise habits: rare golf, gardening, housework. Gets back pain with walking.  Regular seat belt use: YES  Substance use:  reports that she quit smoking  about 2 years ago. Her smoking use included Cigarettes. She has a 40 pack-year smoking history. She has never used smokeless tobacco. She reports that  drinks alcohol. She reports that she does not use illicit drugs.   Review Of Systems:  Constitutional: Denies, fatigue, unexpected weight loss, unexpected weight gain  Regular dental exams: yes  Eye: Denies, blurry vision, diplopia, eye pain  ENT: Denies, hearing difficulties, nasal congestion and any ear nose or throat problems  Respiratory: Denies, dyspnea on exertion, dyspnea at rest, cough, wheezing  Cardiovascular: Denies, chest pain or pressure at rest or during exercise  GI: Denies, change in appetite, dysphagia, nausea, vomiting, dyspepsia, abdominal pain, diarrhea, constipation, hematochezia, melena  GU: Denies, menstrual problems including irregular, heavy or painful menses, prior abnormal pap smear, dysuria, hematuria, urinary incontinence, urinary urgency, vaginal discharge, abnormal vaginal bleeding, pelvic pain  Derm: Denies, skin rash, easy bruising, hair loss, changes in moles or new moles  Rheumatologic/Musculoskeletal: .  Does have, back and joint pain that limits activity  Endocrine: Denies, polyuria, polydipsia, heat intolerance, cold intolerance, prior blood sugar problems, prior thyroid problems  Neurologic: Denies, headaches and tremors  Psych: Denies, depressed mood, sleep disturbance and anxiety      EXAM:  General: healthy, alert, no distress  Skin: Skin color, texture, turgor normal. No  rashes or concerning lesions  Head: Normocephalic. No masses, lesions, tenderness or abnormalities  Eyes: Lids/periorbital skin normal, Conjunctivae/corneas clear,EOM's intact  Neck: supple. No adenopathy. Thyroid symmetric, normal size, without nodules  Lungs: clear to auscultation  Heart: normal rate, regular rhythm and no murmurs, clicks, or gallops  Breasts: No obvious deformity or mass to inspection, nipples everted bilaterally, no skin lesion or nipple  discharge, no mass palpated, no axillary lymphadenopathy  Abd: soft, non-tender. BS normal. No masses or organomegaly, multiple surgical scars  GU: normal bartholin/skene/urethral meatus/anus- left labia minora much larger than right ( she is aware of this but it does not bother her)., Vagina is rugated and without lesions; the vault is well supported.  Rectal: deferred  Extremities: Normal, without deformities, edema, or skin discoloration  Neuro:  Grossly normal to observation, gait normal      ASSESSMENT AND PLAN:    Health Maintenance   Topic Date Due   . Zoster Vaccine  06/07/2011   . Influenza >9 Yrs  08/21/2014   . Colonoscopy Q 3 Years  11/01/2014   . Mammogram 50+  11/04/2014   . Cholesterol-female  10/04/2018   . Tetanus Booster  04/25/2021   . Pertussis Vaccine >11 Yrs  Completed     Immunizations due: per PCP- up to date  Screening tests discussed: Colon cancer: COLONOSCOPY ( states she is due in 2015) and Breast cancer  History sections of chart reviewed and updated today: YES    (627.2) Menopause  (primary encounter diagnosis)  Plan: Estradiol 0.0375 MG/24HR Transdermal PATCH         BIWEEKLY        We discussed the use or oral vs transdermal estrogen. I strongly favor transdermal estradiol based approach. Patches are almost always less costly- thus ordered at lower dose than full pump of estrogel daily. We did discuss weaning this to off over the next few years.    (V72.31) Visit for routine gyn exam  Plan: normal clinical exam.  She has insight into her weight issues. Low impact cardio needed- she is aware and has also discussed this with her PCP.    Follow up: 2 years  Lab tests and results of any diagnostic studies will be shared with the patient by  [facility]

## 2013-10-31 NOTE — Patient Instructions (Signed)
Recommendations for Pap Smear screening age 62-65    . If you have had three normal Pap test results in a row and no new partners, pap smear every 3 years OR pap smear and HPV screening every 5 years  o Exceptions:   - if you have ever been treated for moderate or severe dysplasia of the cervix and have completed post-treatment surveillance, you can return to routine screening for your age group, and continue Pap screening for at least twenty years.  - If you are HIV positive you should have a pap smear every six months for a year after diagnosis and then every year.  - If you have a suppressed immune system you should have a pap yearly  . Discontinue screening at age 65 if there have been three consecutive normal Pap smears and no abnormal in the past ten years with the most recent testing in the last 5 years  . If you are sexually active, you should have a yearly screening for Chlamydia, HIV, and other sexually transmitted infections as appropriate  . If you are using contraception, you should see your provider yearly for symptom review, blood pressure check (if using a hormonal method), and exam if needed.

## 2013-11-05 ENCOUNTER — Other Ambulatory Visit (INDEPENDENT_AMBULATORY_CARE_PROVIDER_SITE_OTHER): Payer: Self-pay | Admitting: Family Medicine

## 2013-11-06 MED ORDER — ALLOPURINOL 100 MG OR TABS
100.0000 mg | ORAL_TABLET | Freq: Every day | ORAL | Status: DC
Start: 2013-11-06 — End: 2014-07-01

## 2013-11-06 NOTE — Telephone Encounter (Signed)
Refill per pharmacy request.  Allopurinol 100 mg    Last seen 10/04/13 for CPE and labs (uric acid 6.7).  No med changes made.

## 2013-11-08 ENCOUNTER — Encounter (INDEPENDENT_AMBULATORY_CARE_PROVIDER_SITE_OTHER): Payer: Self-pay | Admitting: Family Medicine

## 2013-11-09 LAB — HM MAMMOGRAPHY

## 2013-12-27 ENCOUNTER — Other Ambulatory Visit (INDEPENDENT_AMBULATORY_CARE_PROVIDER_SITE_OTHER): Payer: Self-pay | Admitting: Family Medicine

## 2013-12-27 DIAGNOSIS — F329 Major depressive disorder, single episode, unspecified: Secondary | ICD-10-CM

## 2013-12-27 MED ORDER — TRAZODONE HCL 50 MG OR TABS
50.0000 mg | ORAL_TABLET | Freq: Every evening | ORAL | Status: DC | PRN
Start: 2013-12-27 — End: 2015-02-28

## 2013-12-27 NOTE — Telephone Encounter (Signed)
Refill per pharmacy request-90 day supply  Trazodone 50 mg    Last seen: 10/04/13 for CPE  Last fill: 03/04/12  Dx: obstructive sleep apnea (327.23)

## 2014-01-12 ENCOUNTER — Encounter (INDEPENDENT_AMBULATORY_CARE_PROVIDER_SITE_OTHER): Payer: Self-pay | Admitting: Gynecology

## 2014-02-06 ENCOUNTER — Other Ambulatory Visit (INDEPENDENT_AMBULATORY_CARE_PROVIDER_SITE_OTHER): Payer: Self-pay | Admitting: Family Medicine

## 2014-02-06 DIAGNOSIS — F329 Major depressive disorder, single episode, unspecified: Secondary | ICD-10-CM

## 2014-02-06 MED ORDER — VENLAFAXINE HCL ER 75 MG OR CP24
75.0000 mg | EXTENDED_RELEASE_CAPSULE | Freq: Every day | ORAL | Status: DC
Start: 2014-02-06 — End: 2014-06-03

## 2014-02-06 MED ORDER — BUPROPION HCL ER (SR) 150 MG OR TB12
150.0000 mg | EXTENDED_RELEASE_TABLET | Freq: Two times a day (BID) | ORAL | Status: DC
Start: 2014-02-06 — End: 2014-06-03

## 2014-02-06 NOTE — Telephone Encounter (Signed)
Refill per pharmacy request.  Bupropion SR 150 mg  Venlafaxine ER 75 mg    Last seen 10/04/13 for CPE and follow up depression  Not sure if we ever rec'd records from psychiatric ARNP?

## 2014-02-06 NOTE — Telephone Encounter (Signed)
eCare message sent to patient to follow up in 1-2 months.

## 2014-02-06 NOTE — Telephone Encounter (Signed)
Records received.  Patient should be seen in the next 1-2 months.

## 2014-03-02 ENCOUNTER — Encounter (INDEPENDENT_AMBULATORY_CARE_PROVIDER_SITE_OTHER): Payer: Self-pay | Admitting: Family Medicine

## 2014-03-02 DIAGNOSIS — M5136 Other intervertebral disc degeneration, lumbar region: Secondary | ICD-10-CM

## 2014-03-02 MED ORDER — DICLOFENAC SODIUM 75 MG OR TBEC
75.0000 mg | DELAYED_RELEASE_TABLET | Freq: Two times a day (BID) | ORAL | Status: DC | PRN
Start: 2014-03-02 — End: 2016-05-15

## 2014-03-24 ENCOUNTER — Telehealth (INDEPENDENT_AMBULATORY_CARE_PROVIDER_SITE_OTHER): Payer: Self-pay | Admitting: Family Medicine

## 2014-03-24 ENCOUNTER — Ambulatory Visit (INDEPENDENT_AMBULATORY_CARE_PROVIDER_SITE_OTHER): Payer: PRIVATE HEALTH INSURANCE | Admitting: Family Practice

## 2014-03-24 VITALS — BP 130/80 | HR 84 | Temp 97.6°F | Resp 18 | Wt 275.0 lb

## 2014-03-24 DIAGNOSIS — S52501A Unspecified fracture of the lower end of right radius, initial encounter for closed fracture: Secondary | ICD-10-CM

## 2014-03-24 DIAGNOSIS — S6991XA Unspecified injury of right wrist, hand and finger(s), initial encounter: Secondary | ICD-10-CM

## 2014-03-24 DIAGNOSIS — S52599A Other fractures of lower end of unspecified radius, initial encounter for closed fracture: Secondary | ICD-10-CM

## 2014-03-24 DIAGNOSIS — S6990XA Unspecified injury of unspecified wrist, hand and finger(s), initial encounter: Secondary | ICD-10-CM

## 2014-03-24 DIAGNOSIS — S59919A Unspecified injury of unspecified forearm, initial encounter: Secondary | ICD-10-CM

## 2014-03-24 DIAGNOSIS — S59909A Unspecified injury of unspecified elbow, initial encounter: Secondary | ICD-10-CM

## 2014-03-24 MED ORDER — KETOROLAC TROMETHAMINE 30 MG/ML IJ SOLN
60.0000 mg | Freq: Once | INTRAMUSCULAR | Status: AC
Start: 2014-03-24 — End: 2014-03-24
  Administered 2014-03-24: 60 mg via INTRAMUSCULAR

## 2014-03-24 MED ORDER — ACETAMINOPHEN-CODEINE #3 300-30 MG OR TABS
1.0000 | ORAL_TABLET | ORAL | Status: DC | PRN
Start: 2014-03-24 — End: 2014-11-23

## 2014-03-24 NOTE — Telephone Encounter (Signed)
Pt called, she took a fall today and bruised her knee but was more concerned that she may have fx'd her wrist. She was hoping to be seen today with Dr. Marylin Crosby because she did not want to go to the ER and receive a large bill. She is also moving and needed to make sure that her wrist was taken care of with diagnosis and bracing since she can not post pone her moving day. Advised pt that Dr. Marylin Crosby was out of office today and that we were only open until 11am and were finishing our morning for today. Per Nurse Stanton Kidney, directed pt to Urgent Care or ER.

## 2014-03-24 NOTE — Patient Instructions (Signed)
Fracture: Wrist (General)  You have a fracture (break) of a bone in your wrist. This may be a small crack or chip in the bone; or a major break with the broken parts pushed out of position. Wrist fractures are treated with a splint or cast. They take about 4-6 weeks to heal. Severe injuries may require surgery.    Home Care:  1. Keep your arm elevated to reduce pain and swelling. When sitting or lying down elevate your arm above the level of your heart. You can do this by placing your arm on a pillow that rests on your chest or on a pillow at your side. This is most important during the first 48 hours after injury.  2. Apply an ice pack (ice cubes in a plastic bag, wrapped in a towel) over the injured area for 20 minutes every 1-2 hours the first day. You can place the ice pack inside the sling and directly over the splint/cast. Continue with ice packs 3-4 times a day for the next two days, then as needed for the relief of pain and swelling.  3. Keep the cast/splint completely dry at all times. Bathe with your cast/splint out of the water, protected with a large plastic bag, rubber-banded at the top end. If a fiberglass splint/cast gets wet, you can dry it with a hair-dryer.  4. You may use acetaminophen (Tylenol) or ibuprofen (Motrin, Advil) to control pain, unless another pain medicine was prescribed. [NOTE: If you have chronic liver or kidney disease or ever had a stomach ulcer or GI bleeding, talk with your doctor before using these medicines.]  Follow Up  with your doctor in one week, or as advised by our staff, to be sure the bone is healing properly. If a splint was applied, it will be changed to a cast during your follow-up visit.  [NOTE: Any X-rays taken will be reviewed by a radiologist. You will be notified if there are any new findings that may affect your care.]  Get Prompt Medical Attention  if any of the following occur:   The plaster cast or splint becomes wet or soft   The fiberglass cast or  splint remains wet for more than 24 hours   Increased tightness or pain under the cast or splint   Fingers become swollen, cold, blue, numb or tingly   [address], [place] [address]. All rights reserved. This information is not intended as a substitute for professional medical care. Always follow your healthcare professional's instructions.

## 2014-03-24 NOTE — Progress Notes (Signed)
Per orders of Dr. Amalia Hailey an injection of 60 mg/ 52mL ketorolac given in right ventrogluteal by Burnard Leigh. Patient instructed to remain in clinic for 20 minutes afterwards, and to report any adverse reaction to me immediately.

## 2014-03-24 NOTE — Progress Notes (Signed)
[place] [facility] [place]    Urgent Care Family Medicine Note    Chief Complaint    Ms. Lindsey [name] is a 63 year old year old female who presents to [facility] for    Chief Complaint   Patient presents with   . Wrist Injury     right wrist injury d/t fall today      HPI:   CC/Location: R wrist pain s/p fall this AM, tripped on floor pad   Onset: few hrs ago   Duration: unchanged   Modifying factors: has been icing it w minimal relief   Associated S&S: no head trauma or LOC    I personally reviewed and confirmed the ROS, past medical history and past surgical history in the record with the patient today.     Review of Systems  Review of Systems   Constitutional: Negative.    Respiratory: Negative.    Cardiovascular: Negative.    Musculoskeletal: Positive for joint swelling. Negative for gait problem.   Skin: Negative for wound.   Neurological: Negative.      Physical Exam    Filed Vitals:    03/24/14 1132   BP: 130/80   Pulse: 84   Temp: 97.6 F (36.4 C)   TempSrc: Temporal   Resp: 18   Weight: 275 lb (124.739 kg)   SpO2: 97%     Physical Exam   Constitutional: She is oriented to person, place, and time and well-developed, well-nourished, and in no distress.   Neck: Neck supple.   Pulmonary/Chest: Effort normal.   Musculoskeletal:        Right wrist: She exhibits decreased range of motion, tenderness, bony tenderness (mid-radial dorsum) and swelling. She exhibits no laceration.   Neurological: She is alert and oriented to person, place, and time. Gait normal.     Impression and Plan    (959.3) Injury of wrist, right, initial encounter  (primary encounter diagnosis)  Plan: X-RAY FOREARM RIGHT, X-RAY WRIST 3+ VW RIGHT    (813.42) Fracture of radius, distal, right, closed, initial encounter  Plan: neurovascularly intact  - immobilized, [employer]  - REFERRAL TO ORTHOPEDICS for fx management  - ibuprofen or aleve prn pain, Acetaminophen-Codeine #3 300-30 MG Oral Tab  prn breakthrough pain  - ketorolac 30 mg/mL injection administered in clinic    After visit summary given to patient.    Follow up as needed, or if symptoms worsen.    Mikael Spray  Urgent Care  [place]

## 2014-03-26 ENCOUNTER — Ambulatory Visit (INDEPENDENT_AMBULATORY_CARE_PROVIDER_SITE_OTHER): Payer: PRIVATE HEALTH INSURANCE | Admitting: Sports Medicine

## 2014-03-26 VITALS — BP 142/76 | HR 86 | Ht 70.0 in | Wt 275.0 lb

## 2014-03-26 DIAGNOSIS — S52599A Other fractures of lower end of unspecified radius, initial encounter for closed fracture: Secondary | ICD-10-CM

## 2014-03-26 DIAGNOSIS — S52509A Unspecified fracture of the lower end of unspecified radius, initial encounter for closed fracture: Secondary | ICD-10-CM

## 2014-03-26 NOTE — Progress Notes (Signed)
DATE: 03/26/2014     CHIEF COMPLAINT:   Chief Complaint   Patient presents with   . Wrist Injury     right wrist, patient tripped over wooden board and slammed into wall 03/24/14, patient seen at urgent care and had imaging done. Patient was told to see orthopedics to decided whether it would need casting.        HPI:  Lindsey [name] is a 63 year old female who presents for evaluation of R wrist pain. The patient fell on outstretched R hand on 4/4.  Imaging at [facility].       PROBLEM LIST:  Patient Active Problem List   Diagnosis   . Palpitations   . Hypertension   . Hyperlipidemia   . Gout   . Former smoker   . Sleep apnea   . Shortness of breath   . Basal cell carcinoma   . Squamous cell carcinoma   . Depression, major   . Lumbar degenerative disc disease   . Fracture, radius, distal       PAST MEDICAL HISTORY:  Past Medical History   Diagnosis Date   . Anxiety state    . Generalized osteoarthrosis, unspecified site    . Disorder of menstrual bleeding    . Stomach disorder    . Depressive disorder, not elsewhere classified    . Unspecified essential hypertension    . Lipidemia    . Disorder of muscle, ligament, and fascia    . Multiparity      adopted sister's child    . Leiomyoma of body of uterus 1996     acute severe bleeding- hyst   . Osteoporosis        SURGICAL HISTORY:  Past Surgical History   Procedure Laterality Date   . Total abdominal hysterect w/wo rmvl tube ovary  1998     BSO; fibroids   . Cholecystectomy  1990's     schincterotomy   . Back surgery  2004     thoracic fusion   . Mohs any stage > 5 spec each  2012, 2013     SCC R shin, BCC R leg/face       MEDICATIONS:  Outpatient Prescriptions Prior to Visit   Medication Sig Dispense Refill   . Acetaminophen-Codeine #3 300-30 MG Oral Tab Take 1 tablet by mouth every 4 hours as needed for pain.  12 tablet  0   . Allopurinol 100 MG Oral Tab Take 1 tablet (100 mg) by mouth daily.  30 tablet  10   . Aspirin 81 MG Oral Tab 1 TABLET DAILY       .  BuPROPion HCl, SR, 150 MG Oral TABLET SR 12 HR Take 1 tablet (150 mg) by mouth 2 times a day.  60 tablet  3   . Cholecalciferol (VITAMIN D3) 2000 UNITS Oral Tab Take 1 tablet Daily       . Diclofenac Sodium 75 MG Oral Tab EC Take 1 tablet (75 mg) by mouth 2 times a day as needed for pain.  60 tablet  1   . Enalapril Maleate 10 MG Oral Tab Take 1/2 tablet Daily       . Estradiol 0.0375 MG/24HR Transdermal PATCH BIWEEKLY Place 1 patch on the skin 2 times a week. Apply to skin on abdoment or buttocks, remove old patch when replacing with new patch.  8 patch  11   . Fiber Oral Cap Take 1 cap Daily       .  Fluticasone Propionate 50 MCG/ACT Nasal Suspension Spray in each nostril daily       . Multiple Vitamin Oral Tab Take 1 tablet Daily       . Omega-3 Fatty Acids (FISH OIL) 1000 MG Oral Cap Take 1 cap Daily       . Simvastatin 40 MG Oral Tab TAKE ONE-HALF TABLET BY MOUTH EVERY DAY  45 Tab  3   . TraMADol HCl 50 MG Oral Tab Take 1-2 tablets (50-100 mg) by mouth every 6 hours as needed for pain.  30 tablet  1   . TraZODone HCl 50 MG Oral Tab Take 1-2 tablets (50-100 mg) by mouth at bedtime as needed for sleep.  180 tablet  1   . Triamterene-HCTZ 37.5-25 MG Oral Tab Take 1 tablet by mouth daily.  90 tablet  3   . Venlafaxine HCl ER 75 MG Oral CAPSULE SR 24 HR Take 1 capsule (75 mg) by mouth daily.  30 capsule  3   . Vitamin E 400 UNITS Oral Tab Take 1 tablet Daily         No facility-administered medications prior to visit.       ALLERGIES:  Review of patient's allergies indicates:  Allergies   Allergen Reactions   . Pneumovax [Pneumococcal Polysaccharides] Fever, Nausea/Vomiting and Swelling       FAMILY HISTORY:  Family History   Problem Relation Age of Onset   . Hypertension Mother    . Hypertension Father    . Cancer Sister    . Thyroid Disease Sister    . Cancer Brother    . Hypertension Maternal Grandmother    . Hypertension Paternal Grandmother    . Cancer Paternal Grandfather    . Alcohol/Drug Sister       alcoholism/ drug addiction   . Alcohol/Drug Son      alcoholism/ drug addiction   . Depression Mother    . Depression Sister    . Depression Son        SOCIAL HISTORY:  History     Social History   . Marital Status: Married     Spouse Name: N/A     Number of Children: N/A   . Years of Education: N/A     Occupational History   . Not on file.     Social History Main Topics   . Smoking status: Former Smoker -- 1.00 packs/day for 40 years     Types: Cigarettes     Quit date: 05/05/2011   . Smokeless tobacco: Never Used   . Alcohol Use: 0.0 oz/week     0-3 Glasses of wine per week   . Drug Use: No   . Sexual Activity:     Partners: Male     Birth Control/ Protection: Surgical     Other Topics Concern   . Not on file     Social History Narrative    10/04/13: Married, 3 children.  Not working currently.       REVIEW OF SYSTEMS:  A 14-category review of systems reviewed with the patient, is otherwise unchanged and is noncontributory.     PHYSICAL EXAM:  BP 142/76   Pulse 86   Ht 5\' 10"  (1.778 m)   Wt 275 lb (124.739 kg)   BMI 39.46 kg/m2       Physical Examination: Vital signs as noted above.    General Appearance and Mental Status: Well developed, Well nourished, female Oriented x3, behavior is appropriate  HEENT: Head is atraumatic, normocephalic. EOMI.    EXTREMITIES: Full ROM in ankles, knees, hips and shoulders.   MUSCULOSKELETAL: normal gait and station, no clubbing, no cyanosis;      Right Wrist: Tender over distal radius. Soft tissue swelling over distal radius as well. no visible or palpable deformities are appreciated. Full range of motion with palmar flexion, dorsiflexion, radial and ulnar deviation. Normal pronation and supination. Dorsalis pedis and posterior tibial pulses are 2+ and symmetrical. Negative Allen test. Negative Phalen's, negative prayer test. Negative Tinel test. No atrophy is appreciated. Full range of motion at the 1st [facility] joint and 1st MCP joint. Good capillary refill distally. Two-point  discrimination is normal. No atrophy noted. No pain to palpation over the hook of the hamate or the pisiform. No pain over the carpal navicular or the lunate. No pain over the triquetrum or triangular fibrocartilage. No pain to palpation over the ulnar styloid. No ascending lymphangitis. No epitrochlear or axillary adenopathy. Skin is negative for rashes, inflammation, induration, petechiae or purpura.    R Elbow: No joint line pain. No pain to palpation over the medial or lateral epicondyle. No pain to palpation over the olecranon or olecranon fossa. No pain or instability with valgus or varus stress.  No pain to palpation over the antecubital fossa.   Muscle testing is normal with biceps and triceps testing as well as resisted hand grip, extension, supination, pronation and flexion. No epitrochlear or axillary adenopathy. No ascending lymphangitis. No visible or palpable deformities are noted. No atrophy is noted.    SKIN: Negative for rashes, inflammation, induration, petechiae or purpura.  NEURO: Alert and oriented to person, place and time. Distal NVI.     LAB/IMAGING RESULTS:  Imaging and lab results were reviewed and interpreted by me, Elta Guadeloupe D. Earleen Newport, MD.     [facility] which revealed:  Minimally displaced fx of distal radius does not appear to be intra-articular.       ASSESSMENT:    ICD-9-CM   1. Fracture, radius, distal, unspecified laterality, closed, initial encounter 813.42        PLAN:    1. Short arm thumb spike and cast  2. F/u in 3 weeks    Kimbley Sprague is a 63 year old female who presents for evaluation of R wrist pain after a fall to outstretched hand. Seen at [facility] and had imaging completed, advised to see orthopedics for casting. Physical examination as stated above. Review [facility] which revealed minimally displaced fx of distal radius. Will provide short arm thumb spike and cast. The patient will follow up 3 weeks. Patient's questions and concerns were  addressed, she verbalized agreement with treatment plan and further instructions.     Prior EMR records reviewed in EPIC as available and clinically relevant.    Portions of this chart were written by Joyice Faster, Medical Scribe with oversight by Celine Ahr. Earleen Newport, MD on 03/26/2014  1:49 PM

## 2014-03-26 NOTE — Progress Notes (Signed)
IMellody Memos, MD , interviewed and examined the patient while overseeing the documentation performed by my Medical Scribe, Joyice Faster. I have reviewed and revised as necessary the scribe's note and agree with the documented findings and plan of care. Please refer to the scribe's note below for further detailed information regarding the patient encounter and exam.  03/26/2014. 2:43 PM.

## 2014-03-29 NOTE — Addendum Note (Signed)
Addended by: Delsa Grana on: 03/29/2014 04:02 PM     Modules accepted: Orders

## 2014-04-05 ENCOUNTER — Other Ambulatory Visit (INDEPENDENT_AMBULATORY_CARE_PROVIDER_SITE_OTHER): Payer: Self-pay | Admitting: Family Medicine

## 2014-04-05 DIAGNOSIS — I10 Essential (primary) hypertension: Secondary | ICD-10-CM

## 2014-04-05 MED ORDER — ENALAPRIL MALEATE 10 MG OR TABS
5.0000 mg | ORAL_TABLET | Freq: Every day | ORAL | Status: DC
Start: 2014-04-05 — End: 2014-09-06

## 2014-04-05 NOTE — Telephone Encounter (Signed)
ORDERS PENDED: Enalapril 5 MG  Dx: Hypertension [401.9]   Last Seen: 03/26/2014  Last Filled: 03/22/2014            (QTY) #180 TAB  Requesting: Pharmacy

## 2014-04-16 ENCOUNTER — Ambulatory Visit (INDEPENDENT_AMBULATORY_CARE_PROVIDER_SITE_OTHER): Payer: PRIVATE HEALTH INSURANCE | Admitting: Sports Medicine

## 2014-04-16 DIAGNOSIS — S52509A Unspecified fracture of the lower end of unspecified radius, initial encounter for closed fracture: Secondary | ICD-10-CM

## 2014-04-16 DIAGNOSIS — S52599A Other fractures of lower end of unspecified radius, initial encounter for closed fracture: Secondary | ICD-10-CM

## 2014-04-16 NOTE — Progress Notes (Signed)
IMellody Memos, MD , interviewed and examined the patient while overseeing the documentation performed by my Medical Scribe, Joyice Faster. I have reviewed and revised as necessary the scribe's note and agree with the documented findings and plan of care. Please refer to the scribe's note below for further detailed information regarding the patient encounter and exam.  04/16/2014. 2:16 PM.

## 2014-04-16 NOTE — Progress Notes (Signed)
DATE: 04/16/2014     CHIEF COMPLAINT:   Chief Complaint   Patient presents with   . Follow Up Visit     Recheck Right Wrist- Cast off- on for past 3 weeks.         HPI:  Lindsey [name] is a 63 year old female who presents for f/u of R wrist. Last seen 4/06. The pt still complains of mild soreness over the fracture site.     PROBLEM LIST:  Patient Active Problem List   Diagnosis   . Palpitations   . Hypertension   . Hyperlipidemia   . Gout   . Former smoker   . Sleep apnea   . Shortness of breath   . Basal cell carcinoma   . Squamous cell carcinoma   . Depression, major   . Lumbar degenerative disc disease   . Fracture, radius, distal       PAST MEDICAL HISTORY:  Past Medical History   Diagnosis Date   . Anxiety state    . Generalized osteoarthrosis, unspecified site    . Disorder of menstrual bleeding    . Stomach disorder    . Depressive disorder, not elsewhere classified    . Unspecified essential hypertension    . Lipidemia    . Disorder of muscle, ligament, and fascia    . Multiparity      adopted sister's child    . Leiomyoma of body of uterus 1996     acute severe bleeding- hyst   . Osteoporosis        SURGICAL HISTORY:  Past Surgical History   Procedure Laterality Date   . Total abdominal hysterect w/wo rmvl tube ovary  1998     BSO; fibroids   . Cholecystectomy  1990's     schincterotomy   . Back surgery  2004     thoracic fusion   . Mohs any stage > 5 spec each  2012, 2013     SCC R shin, BCC R leg/face       MEDICATIONS:  Outpatient Prescriptions Prior to Visit   Medication Sig Dispense Refill   . Acetaminophen-Codeine #3 300-30 MG Oral Tab Take 1 tablet by mouth every 4 hours as needed for pain.  12 tablet  0   . Allopurinol 100 MG Oral Tab Take 1 tablet (100 mg) by mouth daily.  30 tablet  10   . Aspirin 81 MG Oral Tab 1 TABLET DAILY       . BuPROPion HCl, SR, 150 MG Oral TABLET SR 12 HR Take 1 tablet (150 mg) by mouth 2 times a day.  60 tablet  3   . Cholecalciferol (VITAMIN D3) 2000 UNITS Oral Tab  Take 1 tablet Daily       . Diclofenac Sodium 75 MG Oral Tab EC Take 1 tablet (75 mg) by mouth 2 times a day as needed for pain.  60 tablet  1   . Enalapril Maleate 10 MG Oral Tab Take 0.5 tablets (5 mg) by mouth daily.  180 tablet  0   . Estradiol 0.0375 MG/24HR Transdermal PATCH BIWEEKLY Place 1 patch on the skin 2 times a week. Apply to skin on abdoment or buttocks, remove old patch when replacing with new patch.  8 patch  11   . Fiber Oral Cap Take 1 cap Daily       . Fluticasone Propionate 50 MCG/ACT Nasal Suspension Spray in each nostril daily       .  Multiple Vitamin Oral Tab Take 1 tablet Daily       . Omega-3 Fatty Acids (FISH OIL) 1000 MG Oral Cap Take 1 cap Daily       . Simvastatin 40 MG Oral Tab TAKE ONE-HALF TABLET BY MOUTH EVERY DAY  45 Tab  3   . TraMADol HCl 50 MG Oral Tab Take 1-2 tablets (50-100 mg) by mouth every 6 hours as needed for pain.  30 tablet  1   . TraZODone HCl 50 MG Oral Tab Take 1-2 tablets (50-100 mg) by mouth at bedtime as needed for sleep.  180 tablet  1   . Triamterene-HCTZ 37.5-25 MG Oral Tab Take 1 tablet by mouth daily.  90 tablet  3   . Venlafaxine HCl ER 75 MG Oral CAPSULE SR 24 HR Take 1 capsule (75 mg) by mouth daily.  30 capsule  3   . Vitamin E 400 UNITS Oral Tab Take 1 tablet Daily         No facility-administered medications prior to visit.       ALLERGIES:  Review of patient's allergies indicates:  Allergies   Allergen Reactions   . Pneumovax [Pneumococcal Polysaccharides] Fever, Nausea/Vomiting and Swelling       FAMILY HISTORY:  Family History   Problem Relation Age of Onset   . Hypertension Mother    . Hypertension Father    . Cancer Sister    . Thyroid Disease Sister    . Cancer Brother    . Hypertension Maternal Grandmother    . Hypertension Paternal Grandmother    . Cancer Paternal Grandfather    . Alcohol/Drug Sister      alcoholism/ drug addiction   . Alcohol/Drug Son      alcoholism/ drug addiction   . Depression Mother    . Depression Sister    . Depression  Son        SOCIAL HISTORY:  History     Social History   . Marital Status: Married     Spouse Name: N/A     Number of Children: N/A   . Years of Education: N/A     Occupational History   . Not on file.     Social History Main Topics   . Smoking status: Former Smoker -- 1.00 packs/day for 40 years     Types: Cigarettes     Quit date: 05/05/2011   . Smokeless tobacco: Never Used   . Alcohol Use: 0.0 oz/week     0-3 Glasses of wine per week   . Drug Use: No   . Sexual Activity:     Partners: Male     Birth Control/ Protection: Surgical     Other Topics Concern   . Not on file     Social History Narrative    10/04/13: Married, 3 children.  Not working currently.       REVIEW OF SYSTEMS:  A 14-category review of systems reviewed with the patient, is otherwise unchanged and is noncontributory.     PHYSICAL EXAM:  There were no vitals taken for this visit.    Physical Examination: Vital signs as noted above.    General Appearance and Mental Status: Well developed, Well nourished, female Oriented x3, behavior is appropriate  HEENT: Head is atraumatic, normocephalic. EOMI.   EXTREMITIES: Full ROM in ankles, knees, hips, and shoulders.   MUSCULOSKELETAL:  normal gait and station, no clubbing, no cyanosis  Right Wrist: still has pain to palpation over  fracture site. Full range of motion with palmar flexion, dorsiflexion, radial and ulnar deviation. Normal pronation and supination. Dorsalis pedis and posterior tibial pulses are 2+ and symmetrical. Negative Allen test. Negative Phalen's, negative prayer test. Negative Tinel test. No atrophy is appreciated. Full range of motion at the 1st [facility] joint and 1st MCP joint. Good capillary refill distally. Two-point discrimination is normal. No atrophy noted. No pain to palpation over the hook of the hamate or the pisiform. No pain over the carpal navicular or the lunate. No pain over the triquetrum or triangular fibrocartilage. No pain to palpation over the ulnar styloid. No ascending  lymphangitis. No epitrochlear or axillary adenopathy. Skin is negative for rashes, inflammation, induration, petechiae or purpura.  SKIN: Negative for rashes, inflammation, induration, petechiae or purpura.  NEURO: Alert and oriented to person, place and time. Distal NVI.     LAB/IMAGING RESULTS:  Imaging and lab results were reviewed and interpreted by me, Elta Guadeloupe D. Earleen Newport, MD.     XR Right Wrist 3+ view  Indication: Pain    X-Ray Findings:   Healing comminuted fx of distal radius. appears to be an intra-articular component of fx that has widened and displaced laterally.     Assessments of X-Ray:   Abnormal wrist X-Ray      ASSESSMENT:    ICD-9-CM    1. Fracture, radius, distal, unspecified laterality, closed, initial encounter 813.42 X-RAY WRIST 3+ VW RIGHT     REFERRAL TO ORTHOPEDICS        PLAN:    1. Will refer the pt to hand surgeon for consultation. Dr. Philippa Chester or Dr. Arlester Marker Kailyn Vanderslice is a 63 year old female who presents for recheck of right fx of distal radius. Physical examination as stated above, still has pain over the fx site. Cast was removed in clinic and new films were obtained. There appears to be an intra-articular component of fx that has widened and displaced laterally. We discussed consulting with a hand surgeon. The patient will follow up prn. Patient's questions and concerns were addressed, she verbalized agreement with treatment plan and further instructions.     Prior EMR records reviewed in EPIC as available and clinically relevant.    Portions of this chart were written by Joyice Faster, Medical Scribe with oversight by Celine Ahr. Earleen Newport, MD on 04/16/2014  1:03 PM

## 2014-05-01 ENCOUNTER — Other Ambulatory Visit: Payer: Self-pay

## 2014-05-08 ENCOUNTER — Other Ambulatory Visit: Payer: Self-pay | Admitting: Family Medicine

## 2014-05-08 ENCOUNTER — Ambulatory Visit (INDEPENDENT_AMBULATORY_CARE_PROVIDER_SITE_OTHER): Payer: PRIVATE HEALTH INSURANCE | Admitting: Family Medicine

## 2014-05-08 ENCOUNTER — Encounter (INDEPENDENT_AMBULATORY_CARE_PROVIDER_SITE_OTHER): Payer: Self-pay | Admitting: Family Medicine

## 2014-05-08 VITALS — BP 120/82 | HR 87 | Ht 70.0 in | Wt 275.0 lb

## 2014-05-08 DIAGNOSIS — E785 Hyperlipidemia, unspecified: Secondary | ICD-10-CM

## 2014-05-08 DIAGNOSIS — M109 Gout, unspecified: Secondary | ICD-10-CM

## 2014-05-08 DIAGNOSIS — F329 Major depressive disorder, single episode, unspecified: Secondary | ICD-10-CM

## 2014-05-08 DIAGNOSIS — S52599A Other fractures of lower end of unspecified radius, initial encounter for closed fracture: Secondary | ICD-10-CM

## 2014-05-08 DIAGNOSIS — S52509A Unspecified fracture of the lower end of unspecified radius, initial encounter for closed fracture: Secondary | ICD-10-CM

## 2014-05-08 LAB — COMPREHENSIVE METABOLIC PANEL
ALT (GPT): 27 U/L (ref 7–33)
AST (GOT): 20 U/L (ref 9–38)
Albumin: 4.1 g/dL (ref 3.5–5.2)
Alkaline Phosphatase (Total): 95 U/L (ref 31–132)
Anion Gap: 8 (ref 4–12)
Bilirubin (Total): 0.5 mg/dL (ref 0.2–1.3)
Calcium: 9.4 mg/dL (ref 8.9–10.2)
Carbon Dioxide, Total: 27 mEq/L (ref 22–32)
Chloride: 102 mEq/L (ref 98–108)
Creatinine: 1.06 mg/dL — ABNORMAL HIGH (ref 0.38–1.02)
GFR, Calc, African American: >60 mL/min (ref >59)
GFR, Calc, European American: 53 mL/min — ABNORMAL LOW (ref >59)
Glucose: 98 mg/dL (ref 62–125)
Potassium: 4.3 mEq/L (ref 3.6–5.2)
Protein (Total): 7.2 g/dL (ref 6.0–8.2)
Sodium: 137 mEq/L (ref 135–145)
Urea Nitrogen: 23 mg/dL — ABNORMAL HIGH (ref 8–21)

## 2014-05-08 LAB — LIPID PANEL
Cholesterol (LDL): 106 mg/dL (ref <130)
Cholesterol/HDL Ratio: 3.4
HDL Cholesterol: 61 mg/dL (ref >40)
Non-HDL Cholesterol: 145 mg/dL (ref 0–159)
Total Cholesterol: 206 mg/dL — ABNORMAL HIGH (ref <200)
Triglyceride: 197 mg/dL — ABNORMAL HIGH (ref <150)

## 2014-05-08 LAB — URIC ACID, SERUM: Uric Acid: 6.4 mg/dL (ref 2.6–6.8)

## 2014-05-08 NOTE — Progress Notes (Signed)
Chief Complaint   Patient presents with   . Depression     Follow up; continues to do well on bupropion and venlafaxine   . Wrist Injury     Right wrist fracture 03/24/14.  Seen originally at [facility] urgent care and then by Dr. Earleen Newport.  Recently saw Dr. Elwin Sleight.  discuss plan of care   . Medication Management     doing well on HTN and gout meds.  Last labs 10/04/13.  Discuss diclofenac for joint/back pain       SUBJECTIVE:  Lindsey [name] is a 63 year old female who presents today for multiple concerns.    Depression:  Her first concern is to follow-up her depression.  She continues to feel better on the combination of bupropion and venlafaxine.  Unfortunately, she is concerned about her weight gain.  She also been nail biting as of recent.  She did recently move.  She states she's been a little bit more stress than normal, but she feels that she is doing well.  She denies any homicidal or suicidal deviation.  She states her sleep is excellent.  She has some mild fatigue.  She is still having some decreased interest or pleasure in doing things    Right Wrist Fracture: She sustained a right wrist distal radius fracture 03/24/14.  [facility] and then she was seen in follow-up by Dr. Mellody Memos.  It wasn't until the second follow-up that she was noted to have a complete distal radius fracture.  She was then sent to Dr. Alejandro Mulling at [facility] hand.  He reevaluated her and concluded she did not need surgery.  She's been in a wrist brace ever since.  She's having minimal discomfort.  She is scheduled to see Dr.Miyano at the end of the month.  She is interested in following up with me as opposed to going all way down to [facility].    Medications:  She is currently on her hypertension and gout meds.  Her last labs were in October.  She denies any concerns.  She is somewhat concerned about her ongoing joint pain.  She's been taking diclofenac.  Luiz Blare enough there something all she can  take.      ROS:  Other than those listed in HPI, all other review of systems are negative    Review of patient's allergies indicates:  Allergies   Allergen Reactions   . Pneumovax [Pneumococcal Polysaccharides] Fever, Nausea/Vomiting and Swelling       Current Outpatient Prescriptions   Medication Sig Dispense Refill   . Acetaminophen-Codeine #3 300-30 MG Oral Tab Take 1 tablet by mouth every 4 hours as needed for pain. 12 tablet 0   . Allopurinol 100 MG Oral Tab Take 1 tablet (100 mg) by mouth daily. 30 tablet 10   . Aspirin 81 MG Oral Tab 1 TABLET DAILY     . BuPROPion HCl, SR, 150 MG Oral TABLET SR 12 HR Take 1 tablet (150 mg) by mouth 2 times a day. 60 tablet 3   . Celecoxib (CELEBREX) 200 MG Oral Cap Take 200 mg by mouth as needed     . Cholecalciferol (VITAMIN D3) 2000 UNITS Oral Tab Take 1 tablet Daily     . Diclofenac Sodium 75 MG Oral Tab EC Take 1 tablet (75 mg) by mouth 2 times a day as needed for pain. 60 tablet 1   . Enalapril Maleate 10 MG Oral Tab Take 0.5 tablets (  5 mg) by mouth daily. 180 tablet 0   . Enalapril Maleate 20 MG Oral Tab Take 20 mg by mouth every day.     . Enalapril Maleate 5 MG Oral Tab   0   . Estradiol 0.0375 MG/24HR Transdermal PATCH BIWEEKLY Place 1 patch on the skin 2 times a week. Apply to skin on abdoment or buttocks, remove old patch when replacing with new patch. 8 patch 11   . Estrogens Conj Synthetic A (CENESTIN) 0.3 MG Oral Tab Take .3 mg by mouth every day.     . Fiber Oral Cap Take 1 cap Daily     . FLUoxetine HCl 20 MG Oral Cap Take 20 mg by mouth every day.     . Fluticasone Propionate 50 MCG/ACT Nasal Suspension Spray in each nostril daily     . Multiple Vitamin Oral Tab Take 1 tablet Daily     . Omega-3 Fatty Acids (FISH OIL) 1000 MG Oral Cap Take 1 cap Daily     . Simvastatin 20 MG Oral Tab Take 20 mg by mouth every day.     . TraMADol HCl 50 MG Oral Tab Take 1-2 tablets (50-100 mg) by mouth every 6 hours as needed for pain. 30 tablet 1   . TraZODone HCl 50 MG Oral  Tab Take 1-2 tablets (50-100 mg) by mouth at bedtime as needed for sleep. 180 tablet 1   . Triamterene-HCTZ 50-25 MG Oral Cap Take 1 Cap by mouth every day.     . Venlafaxine HCl ER 75 MG Oral CAPSULE SR 24 HR Take 1 capsule (75 mg) by mouth daily. 30 capsule 3   . Vitamin E 400 UNITS Oral Tab Take 1 tablet Daily     . ZOSTAVAX 41660 UNT/0.65ML Subcutaneous Recon Soln   0     No current facility-administered medications for this visit.       Family History   Problem Relation Age of Onset   . Hypertension Mother    . Hypertension Father    . Cancer Sister    . Thyroid Disease Sister    . Cancer Brother    . Hypertension Maternal Grandmother    . Hypertension Paternal Grandmother    . Cancer Paternal Grandfather    . Alcohol/Drug Sister      alcoholism/ drug addiction   . Alcohol/Drug Son      alcoholism/ drug addiction   . Depression Mother    . Depression Sister    . Depression Son        History     Social History   . Marital Status: Married     Spouse Name: N/A     Number of Children: N/A   . Years of Education: N/A     Occupational History   . Not on file.     Social History Main Topics   . Smoking status: Former Smoker -- 1.00 packs/day for 40 years     Types: Cigarettes     Quit date: 05/05/2011   . Smokeless tobacco: Never Used   . Alcohol Use: 0.0 oz/week     0-3 Glasses of wine per week   . Drug Use: No   . Sexual Activity:     Partners: Male     Birth Control/ Protection: Surgical     Other Topics Concern   . Not on file     Social History Narrative    10/04/13: Married, 3 children.  Not working currently.  OBJECTIVE: Ht 5\' 10"  (1.778 m) Wt 275 lb (124.739 kg) Body mass index is 39.46 kg/(m^2). BP 120/82  Pulse 87  Ht 5\' 10"  (1.778 m)  Wt 275 lb (124.739 kg)  BMI 39.46 kg/m2  SpO2 96%   GEN:  Well developed, well nourished female in no acute distress, sitting comfortably in the exam room.  HEENT:  AT/NC.  Pupils equal, round and reactive to light.  Extra occular muscles intact.  NECK:  Supple.   No LAD  LUNGS: CTA bilaterally.  No R/W/R.  CARDIOVASCULAR: RRR, no murmurs, gallops or rubs.  MUSCULOSKELETAL: Right wrist: She has no gross deformity, erythema, ecchymosis.  She does have tenderness over the distal radius and DRUJ.  She has mild tenderness over the distal aspect of the ulna.  Her wrist range of motion is full.  I did not attempt an ulnar grind maneuver today.  Sensory exam is grossly intact.   NEURO:  Alert and Oriented times three.  DTR's equal and symmetric.  Affect is normal.    RADIOLOGY:  X-ray Wrist 3+ Vw Right 03/24/14: These images show a transverse, nondisplaced distal radius fracture.      ASSESSMENT:  63 year old healthy female with:  (296.20) Depression, major  (primary encounter diagnosis)  (272.4) Hyperlipidemia  (274.9) Gout  (813.42) Fracture, radius, distal, unspecified laterality, closed, initial encounter    PLAN:  Depression: We discussed her medications.  She is doing well on this medication, however she is having side effects of weight gain and nail biting.  We discussed weaning her off of the venlafaxine.  She is interested in doing this.  She just refilled her medications so we will decrease by taking the tablet every other day for the next 10 days, then she'll decrease to every 2 days for 10 days before stopping.  If she has a worsening in her symptoms, we could resume the venlafaxine.  Otherwise we could also try Prozac in common issue with the bupropion.  I will see her back as needed.    Right wrist distal radius fracture: I reviewed the x-rays today.  Given that she's her reestablish with Dr. Elwin Sleight, I think she should keep her appointment with him at the end of the month.  In the meantime she will continue with the wrist splint.    Hyperlipidemia/gout: Burnis Medin go ahead and repeat blood work today.  We will adjust her medications as need be.    Arthralgias: I like her to wean off the diclofenac.  Instead like to start taking 2 Aleve twice daily.  If this does not work, we  can try a different NSAID.    F/U:  PRN      This document was generated in part using voice recognition technology.  Although every effort was made to insure accuracy, transcription errors may occur.

## 2014-05-08 NOTE — Patient Instructions (Signed)
Start to wean off the venlafaxine.  Take every other day for 10 days, then every two days for ten days then stop.      See Dr. Elwin Sleight

## 2014-06-03 ENCOUNTER — Other Ambulatory Visit (INDEPENDENT_AMBULATORY_CARE_PROVIDER_SITE_OTHER): Payer: Self-pay | Admitting: Family Medicine

## 2014-06-03 DIAGNOSIS — F329 Major depressive disorder, single episode, unspecified: Secondary | ICD-10-CM

## 2014-06-04 MED ORDER — BUPROPION HCL ER (SR) 150 MG OR TB12
150.0000 mg | EXTENDED_RELEASE_TABLET | Freq: Two times a day (BID) | ORAL | Status: DC
Start: 2014-06-04 — End: 2014-07-01

## 2014-06-04 MED ORDER — VENLAFAXINE HCL ER 75 MG OR CP24
75.0000 mg | EXTENDED_RELEASE_CAPSULE | Freq: Every day | ORAL | Status: DC
Start: 2014-06-04 — End: 2014-09-06

## 2014-06-04 NOTE — Telephone Encounter (Addendum)
Refill per pharmacy request.  Bupropion ER 150 mg  Venlafaxine ER 75 mg    Last seen 05/08/14 for:  (296.20) Depression, major (primary encounter diagnosis)  (272.4) Hyperlipidemia  (274.9) Gout  (813.42) Fracture, radius, distal, unspecified laterality, closed, initial encounter    At visit 05/08/14 had discussed weaning off venlafaxine.  Called and spoke with patient.  She hasn't tried to wean off of it yet as she was traveling.  She will consider trying to wean off of it soon now and let us know how it goes.  Reviewed instructions on weaning off with her.

## 2014-07-01 ENCOUNTER — Other Ambulatory Visit (INDEPENDENT_AMBULATORY_CARE_PROVIDER_SITE_OTHER): Payer: Self-pay | Admitting: Family Medicine

## 2014-07-01 DIAGNOSIS — E785 Hyperlipidemia, unspecified: Secondary | ICD-10-CM

## 2014-07-01 DIAGNOSIS — F331 Major depressive disorder, recurrent, moderate: Secondary | ICD-10-CM

## 2014-07-01 DIAGNOSIS — I10 Essential (primary) hypertension: Secondary | ICD-10-CM

## 2014-07-01 DIAGNOSIS — M1A00X0 Idiopathic chronic gout, unspecified site, without tophus (tophi): Secondary | ICD-10-CM

## 2014-07-02 MED ORDER — ALLOPURINOL 100 MG OR TABS
100.0000 mg | ORAL_TABLET | Freq: Every day | ORAL | Status: DC
Start: 2014-07-02 — End: 2014-09-06

## 2014-07-02 MED ORDER — BUPROPION HCL ER (SR) 150 MG OR TB12
150.0000 mg | EXTENDED_RELEASE_TABLET | Freq: Two times a day (BID) | ORAL | Status: DC
Start: 2014-07-02 — End: 2014-09-06

## 2014-07-02 MED ORDER — SIMVASTATIN 40 MG OR TABS
20.0000 mg | ORAL_TABLET | Freq: Every day | ORAL | Status: DC
Start: 2014-07-02 — End: 2014-09-06

## 2014-07-02 NOTE — Telephone Encounter (Signed)
Refill per pharmacy request.  Allopurinol 100 mg  Bupropion SR 150 mg  Simvastatin 40 mg    Last seen 05/08/14 for follow up:  (296.20) Depression, major (primary encounter diagnosis)  (272.4) Hyperlipidemia  (274.9) Gout  (813.42) Fracture, radius, distal, unspecified laterality, closed, initial encounter    Due for physical and labs 09/2014

## 2014-07-24 ENCOUNTER — Other Ambulatory Visit (INDEPENDENT_AMBULATORY_CARE_PROVIDER_SITE_OTHER): Payer: Self-pay | Admitting: Family Medicine

## 2014-07-24 DIAGNOSIS — F419 Anxiety disorder, unspecified: Secondary | ICD-10-CM

## 2014-07-24 MED ORDER — ALPRAZOLAM 0.5 MG OR TABS
0.2500 mg | ORAL_TABLET | Freq: Two times a day (BID) | ORAL | Status: DC | PRN
Start: 2014-07-24 — End: 2014-12-03

## 2014-07-24 NOTE — Telephone Encounter (Signed)
Rx alprazolam 0.5 mg faxed to [facility].

## 2014-07-24 NOTE — Telephone Encounter (Signed)
Medication refilled.  Please send to pharmacy.

## 2014-07-24 NOTE — Telephone Encounter (Signed)
Refill per pharmacy request.  Alprazolam 0.5 mg    Last seen 05/08/14 for follow up:  (296.20) Depression, major (primary encounter diagnosis)  (272.4) Hyperlipidemia  (274.9) Gout  (813.42) Fracture, radius, distal, unspecified laterality, closed, initial encounter    Due for physical and labs 09/2014    Last fill: 09/07/13

## 2014-07-26 ENCOUNTER — Other Ambulatory Visit (INDEPENDENT_AMBULATORY_CARE_PROVIDER_SITE_OTHER): Payer: Self-pay | Admitting: Family Medicine

## 2014-07-26 DIAGNOSIS — M5136 Other intervertebral disc degeneration, lumbar region: Secondary | ICD-10-CM

## 2014-07-26 MED ORDER — TRAMADOL HCL 50 MG OR TABS
50.0000 mg | ORAL_TABLET | Freq: Four times a day (QID) | ORAL | Status: DC | PRN
Start: 2014-07-26 — End: 2014-12-03

## 2014-07-26 NOTE — Telephone Encounter (Signed)
Refill per pharmacy request.  Tramadol 50 mg    Last seen 05/08/14 for follow up:  (296.20) Depression, major (primary encounter diagnosis)  (272.4) Hyperlipidemia  (274.9) Gout  (813.42) Fracture, radius, distal, unspecified laterality, closed, initial encounter    Due for physical and labs 09/2014    Last seen for low back: 10/04/13 with CPE  Last fill: 10/04/13

## 2014-07-26 NOTE — Telephone Encounter (Signed)
Rx tramadol 50 mg faxed to [facility]

## 2014-07-26 NOTE — Telephone Encounter (Signed)
Medication refilled.  Please send to pharmacy.

## 2014-09-06 ENCOUNTER — Encounter (INDEPENDENT_AMBULATORY_CARE_PROVIDER_SITE_OTHER): Payer: Self-pay | Admitting: Family Medicine

## 2014-09-06 ENCOUNTER — Ambulatory Visit (INDEPENDENT_AMBULATORY_CARE_PROVIDER_SITE_OTHER): Payer: PRIVATE HEALTH INSURANCE | Admitting: Family Medicine

## 2014-09-06 VITALS — BP 142/94 | HR 97 | Ht 70.0 in | Wt 272.0 lb

## 2014-09-06 DIAGNOSIS — M2550 Pain in unspecified joint: Secondary | ICD-10-CM

## 2014-09-06 DIAGNOSIS — M778 Other enthesopathies, not elsewhere classified: Secondary | ICD-10-CM

## 2014-09-06 DIAGNOSIS — S5290XD Unspecified fracture of unspecified forearm, subsequent encounter for closed fracture with routine healing: Secondary | ICD-10-CM

## 2014-09-06 DIAGNOSIS — M65839 Other synovitis and tenosynovitis, unspecified forearm: Secondary | ICD-10-CM

## 2014-09-06 DIAGNOSIS — D1739 Benign lipomatous neoplasm of skin and subcutaneous tissue of other sites: Secondary | ICD-10-CM

## 2014-09-06 DIAGNOSIS — M1A00X0 Idiopathic chronic gout, unspecified site, without tophus (tophi): Secondary | ICD-10-CM

## 2014-09-06 DIAGNOSIS — D1720 Benign lipomatous neoplasm of skin and subcutaneous tissue of unspecified limb: Secondary | ICD-10-CM

## 2014-09-06 DIAGNOSIS — M65849 Other synovitis and tenosynovitis, unspecified hand: Secondary | ICD-10-CM

## 2014-09-06 DIAGNOSIS — I10 Essential (primary) hypertension: Secondary | ICD-10-CM

## 2014-09-06 DIAGNOSIS — S52501D Unspecified fracture of the lower end of right radius, subsequent encounter for closed fracture with routine healing: Secondary | ICD-10-CM

## 2014-09-06 DIAGNOSIS — F331 Major depressive disorder, recurrent, moderate: Secondary | ICD-10-CM

## 2014-09-06 DIAGNOSIS — E785 Hyperlipidemia, unspecified: Secondary | ICD-10-CM

## 2014-09-06 NOTE — Progress Notes (Signed)
Chief Complaint   Patient presents with   . Follow Up Visit     right wrist fracture 03/24/14.  Saw Dr. Elwin Sleight in June. Was told to follow up only if not feeling better (did not have any more xrays).  Still having pain with golf, twisting motions, gripping/pulling.      . Mass     lump above left knee; x1 month.  No pain, hasn't grown larger.     . Arthritis     Follow up arthralgias; was taken off diclofenac at last visit (05/08/14) and taking ibuprofen (3 in am, 3 in pm) and tramadol.  Meds don't help on really bad days of joint pain.   . Medication Management     we will be recieving fax for new mail order pharmacy for meds[facility]       SUBJECTIVE:  Lindsey [name] is a 63 year old female who presents today for multiple concerns.    Right wrist: Her first concern is her ulnar sided right wrist pain.  She sustained a distal radius fracture 03/24/14 and was followed initially by Dr. Mellody Memos addendum by Dr. Alejandro Mulling at [facility].  The fracture healed well.  Unfortunately, ever since she's been having lateral/ulnar wrist pain.  It's worse with certain things like golfing, twisting motions, gripping or pulling.  She does not feel any clicking or popping.  She's done nothing for this.  She is not iced or taken any particular medication.  There is no numbness or tingling or nighttime pain.  She feels that her range of motion is full.      Right knee mass: Approximately a month ago she felt a lump in her anterior left thigh.  She states there was no trauma.  She was just rubbing her thigh when she noticed this.  It is firm yet mobile.  There is no pain.  She denies any fevers or chills.    Arthralgias:  Lindsey [name] continues to have bilateral knee and wrist pain that bothers her daily.  She is also suffering from ongoing low back pain.  Previously she was controlled on diclofenac 75 mg twice daily.  Due to some elevations in her creatinine, we stopped this.  She did try naproxen but there was not enough improvement.  She  recently he's been using ibuprofen but with the weather changes her symptoms are worse.  She does feel stiff in the morning.      Medication management: Lindsey [name] is switching her prescriptions to a mail order pharmacy and needs them all renewed.    ROS:  Other than those listed in HPI, all other review of systems are negative    Review of patient's allergies indicates:  Allergies   Allergen Reactions   . Pneumovax [Pneumococcal Polysaccharide Vaccine] Fever, Nausea/Vomiting and Swelling       Current Outpatient Prescriptions   Medication Sig Dispense Refill   . Acetaminophen-Codeine #3 300-30 MG Oral Tab Take 1 tablet by mouth every 4 hours as needed for pain. 12 tablet 0   . Allopurinol 100 MG Oral Tab Take 1 tablet (100 mg) by mouth daily. 90 tablet 1   . ALPRAZolam 0.5 MG Oral Tab Take 0.5-1 tablets (0.25-0.5 mg) by mouth 2 times a day as needed for anxiety. for anxiety 10 tablet 0   . Aspirin 81 MG Oral Tab 1 TABLET DAILY     . BuPROPion HCl, SR, 150 MG Oral TABLET SR 12 HR Take 1 tablet (150 mg) by mouth  2 times a day. 180 tablet 1   . Celecoxib (CELEBREX) 200 MG Oral Cap Take 200 mg by mouth as needed     . Cholecalciferol (VITAMIN D3) 2000 UNITS Oral Tab Take 1 tablet Daily     . Diclofenac Sodium 75 MG Oral Tab EC Take 1 tablet (75 mg) by mouth 2 times a day as needed for pain. 60 tablet 1   . Enalapril Maleate 10 MG Oral Tab Take 0.5 tablets (5 mg) by mouth daily. 45 tablet 1   . Enalapril Maleate 20 MG Oral Tab Take 20 mg by mouth every day.     . Enalapril Maleate 5 MG Oral Tab   0   . Estradiol 0.0375 MG/24HR Transdermal PATCH BIWEEKLY Place 1 patch on the skin 2 times a week. Apply to skin on abdoment or buttocks, remove old patch when replacing with new patch. 8 patch 11   . Estrogens Conj Synthetic A (CENESTIN) 0.3 MG Oral Tab Take .3 mg by mouth every day.     . Fiber Oral Cap Take 1 cap Daily     . FLUoxetine HCl 20 MG Oral Cap Take 20 mg by mouth every day.     . Fluticasone Propionate 50 MCG/ACT  Nasal Suspension Spray in each nostril daily     . Multiple Vitamin Oral Tab Take 1 tablet Daily     . Omega-3 Fatty Acids (FISH OIL) 1000 MG Oral Cap Take 1 cap Daily     . Simvastatin 20 MG Oral Tab Take 20 mg by mouth every day.     . Simvastatin 40 MG Oral Tab Take 0.5 tablets (20 mg) by mouth daily. 45 tablet 1   . TraMADol HCl 50 MG Oral Tab Take 1-2 tablets (50-100 mg) by mouth every 6 hours as needed for pain. 30 tablet 1   . TraZODone HCl 50 MG Oral Tab Take 1-2 tablets (50-100 mg) by mouth at bedtime as needed for sleep. 180 tablet 1   . Triamterene-HCTZ 50-25 MG Oral Cap Take 1 capsule by mouth daily. Take 1 Cap by mouth every day. 90 capsule 1   . Venlafaxine HCl ER 75 MG Oral CAPSULE SR 24 HR Take 1 capsule (75 mg) by mouth daily. 90 capsule 1   . Vitamin E 400 UNITS Oral Tab Take 1 tablet Daily     . ZOSTAVAX 16109 UNT/0.65ML Subcutaneous Recon Soln   0     No current facility-administered medications for this visit.       Family History   Problem Relation Age of Onset   . Hypertension Mother    . Hypertension Father    . Cancer Sister    . Thyroid Disease Sister    . Cancer Brother    . Hypertension Maternal Grandmother    . Hypertension Paternal Grandmother    . Cancer Paternal Grandfather    . Alcohol/Drug Sister      alcoholism/ drug addiction   . Alcohol/Drug Son      alcoholism/ drug addiction   . Depression Mother    . Depression Sister    . Depression Son        History     Social History   . Marital Status: Married     Spouse Name: N/A     Number of Children: N/A   . Years of Education: N/A     Occupational History   . Not on file.     Social History Main Topics   .  Smoking status: Former Smoker -- 1.00 packs/day for 40 years     Types: Cigarettes     Quit date: 05/05/2011   . Smokeless tobacco: Never Used   . Alcohol Use: 0.0 oz/week     0-3 Glasses of wine per week   . Drug Use: No   . Sexual Activity:     Partners: Male     Birth Control/ Protection: Surgical     Other Topics Concern   .  Not on file     Social History Narrative    10/04/13: Married, 3 children.  Not working currently.         OBJECTIVE: Ht 5\' 10"  (1.778 m) Wt 272 lb (123.378 kg) Body mass index is 39.03 kg/(m^2). BP 142/94 mmHg  [name] 97  Ht 5\' 10"  (1.778 m)  Wt 272 lb (123.378 kg)  BMI 39.03 kg/m2  SpO2 98%   GEN:  Well developed, well nourished female in no acute distress, sitting comfortably in the exam room.  HEENT:  AT/NC.  Pupils equal, round and reactive to light.  Extra occular muscles intact.  NECK:  Supple.  No LAD  MUSCULOSKELETAL:   Right wrist:  -- Inspection: no deformity, no asymmetry, no atrophy, no bruising noted  -- ROM: She has full wrist flexion, wrist extension, ulnar deviation and radial deviation.  -- Strength: 5/5 with flexion, 5/5 with extension, 5/5 grip strength.  She has 5/5 strength in resisted ulnar deviation, however this is painful  -- Special tests: neg Tinel's test (carpal tunnel), neg Phalen's test, neg Tinel's test (over canal of Guyon), neg Finkelstein's test, neg Watson's test, ulnar grind test is negative.  -- Palpation: no antaomic snuffbox tenderness. no metacarpal tenderness. no TFCC tenderness.  There is no tenderness over the distal ulna, or distal radius.  She does have tenderness over the FCU and ECU.  -- Neurovascular exam: neg Allen's test. Capillary refill neg. Sensory exam is intact, with normal 2 point discrimination.     Left Knee/thigh:  There is no erythema, ecchymosis, or effusion.  Approximately 2 inches superior to the patella there is a firm 1 cm rubbery mass that is mobile consistent with a lipoma.  It is not tender.  No other abnormalities appreciated.    NEURO:  Alert and Oriented times three.  DTR's equal and symmetric.  Affect is normal.      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  Her symptoms seem more consistent with a tendinitis.  We discussed the role of physical therapy/occupational therapy.  She like to hold off.  She will go ahead and start icing.  If her symptoms fail to improve, the next step is physical therapy/occupational therapy.  I believe her distal radius fracture has resolved.    Arthralgias: We discussed her medication management for her arthralgias.  Ideally she should use the NSAIDs as little as possible.  I'm encouraging her to use ibuprofen or naproxen for mild pain, but if her symptoms are worse then she may use a limited amount of diclofenac.  We will continue to monitor her  kidney function.    Left thigh lipoma: She was reassured.  At this point we'll monitor this.  If he gets larger then she'll need additional imaging, probably an ultrasound.    Medication management: Her allopurinol, bupropion, enalapril, simvastatin, triamterene/HCTZ, and venlafaxine were all refilled today.    F/U:  PRN      Nelda Marseille. Laurent Cargile, MD  [facility]  [address], [place] [address]  T: [phone]  F: [phone]      This document was generated in part using voice recognition technology.  Although every effort was made to insure accuracy, transcription errors may occur.

## 2014-09-07 MED ORDER — ENALAPRIL MALEATE 10 MG OR TABS
5.0000 mg | ORAL_TABLET | Freq: Every day | ORAL | Status: DC
Start: 2014-09-07 — End: 2015-01-23

## 2014-09-07 MED ORDER — SIMVASTATIN 40 MG OR TABS
20.0000 mg | ORAL_TABLET | Freq: Every day | ORAL | Status: DC
Start: 2014-09-07 — End: 2015-01-23

## 2014-09-07 MED ORDER — ALLOPURINOL 100 MG OR TABS
100.0000 mg | ORAL_TABLET | Freq: Every day | ORAL | Status: DC
Start: 2014-09-07 — End: 2015-01-23

## 2014-09-07 MED ORDER — TRIAMTERENE-HCTZ 50-25 MG OR CAPS
1.0000 | ORAL_CAPSULE | Freq: Every day | ORAL | Status: DC
Start: 2014-09-07 — End: 2014-09-18

## 2014-09-07 MED ORDER — VENLAFAXINE HCL ER 75 MG OR CP24
75.0000 mg | EXTENDED_RELEASE_CAPSULE | Freq: Every day | ORAL | Status: DC
Start: 2014-09-07 — End: 2015-01-23

## 2014-09-07 MED ORDER — BUPROPION HCL ER (SR) 150 MG OR TB12
150.0000 mg | EXTENDED_RELEASE_TABLET | Freq: Two times a day (BID) | ORAL | Status: DC
Start: 2014-09-07 — End: 2015-01-23

## 2014-09-17 ENCOUNTER — Encounter (INDEPENDENT_AMBULATORY_CARE_PROVIDER_SITE_OTHER): Payer: Self-pay | Admitting: Family Medicine

## 2014-09-17 DIAGNOSIS — I10 Essential (primary) hypertension: Secondary | ICD-10-CM

## 2014-09-18 MED ORDER — TRIAMTERENE-HCTZ 37.5-25 MG OR TABS
1.0000 | ORAL_TABLET | Freq: Every day | ORAL | Status: DC
Start: 2014-09-18 — End: 2017-03-30

## 2014-11-23 ENCOUNTER — Encounter (INDEPENDENT_AMBULATORY_CARE_PROVIDER_SITE_OTHER): Payer: Self-pay | Admitting: Family Practice

## 2014-11-23 ENCOUNTER — Ambulatory Visit (INDEPENDENT_AMBULATORY_CARE_PROVIDER_SITE_OTHER): Payer: PRIVATE HEALTH INSURANCE | Admitting: Family Practice

## 2014-11-23 VITALS — BP 112/78 | HR 91 | Temp 99.4°F | Resp 20

## 2014-11-23 DIAGNOSIS — J189 Pneumonia, unspecified organism: Secondary | ICD-10-CM

## 2014-11-23 MED ORDER — AMOXICILLIN-POT CLAVULANATE 875-125 MG OR TABS
1.0000 | ORAL_TABLET | Freq: Two times a day (BID) | ORAL | Status: AC
Start: 2014-11-23 — End: 2014-11-30

## 2014-11-23 MED ORDER — GUAIFENESIN-CODEINE 100-10 MG/5ML OR SYRP
10.0000 mL | ORAL_SOLUTION | Freq: Every evening | ORAL | Status: DC | PRN
Start: 2014-11-23 — End: 2015-02-19

## 2014-11-23 NOTE — Progress Notes (Signed)
[facility]    Urgent Care Family Medicine Note    Chief Complaint    Ms. Jissell Trafton is a 63 year old year old female who presents to [facility] for   Chief Complaint   Patient presents with   . URI     Had a head cold about 30 days ago, had about a week break then it started in her chest 1week ago. Mucous is very thick and difficult to cough up. Coughing is keeping awake at night. Pt wears a CPAP. Weakness and fatigue, achy joints.     HPI:   CC/Location: persistent productive cough   Onset: 2wks ago, previously ill w common cold sxs 1 month ago   Duration: persistent and worsening   Modifying factors: no relief w otc remedies   Associated S&S: low-grade temps, no GI sxs, tolerating regular po diet    I personally reviewed and confirmed the ROS and past medical history in the record with the patient today.     Review of Systems  Review of Systems   Constitutional: Negative.    HENT: Positive for congestion.    Respiratory: Positive for cough.    Cardiovascular: Negative.    Gastrointestinal: Negative.    Genitourinary: Negative.    Neurological: Negative.      Physical Exam    Filed Vitals:    11/23/14 1343   BP: 112/78   Pulse: 91   Temp: 99.4 F (37.4 C)   TempSrc: Temporal   Resp: 20   SpO2: 96%     Physical Exam   Constitutional: She appears well-developed and well-nourished. No distress.   HENT:   Mouth/Throat: Oropharynx is clear and moist.   Eyes: Conjunctivae are normal.   Neck: Neck supple.   Cardiovascular: Normal rate and regular rhythm.    Pulmonary/Chest: Effort normal. She has rhonchi in the right middle field.   Lymphadenopathy:     She has no cervical adenopathy.   Neurological: She is alert.     Impression and Plan    (J18.9) CAP (community acquired pneumonia)  (primary encounter diagnosis)  Plan: afebrile, worsening clinical course w focal pulm exam, will treat for secondary bacterial infection  - Amoxicillin-Pot Clavulanate 875-125 MG Oral  Tab  - Guaifenesin-Codeine 100-10 MG/5ML Oral Syrup  Return to clinic for worsening or persistent sxs    After visit summary given to patient.    Follow up as needed, or if symptoms worsen.    Mikael Spray, M.D.  Urgent Care  [facility]

## 2014-11-23 NOTE — Patient Instructions (Signed)
Bronchitis, Antibiotic Treatment(Adult)    Bronchitis is an infection of the air passages (bronchial tubes) in your lungs. It often occurs when you have a cold.  Symptoms of bronchitis include cough with mucus (phlegm) and low-grade fever. Bronchitis usually lasts 7 to 14 days. Mild cases can be treated with simple home remedies. More severe infection is treated with an antibiotic.  Home care  Follow these guidelines when caring for yourself at home:   If your symptoms are severe, rest at home for the first 2 to 3 days. When you go back to your usual activities, don't let yourself get too tired.   Don't smoke. Avoid being around the smoke of others.   You may use acetaminophen or ibuprofen to control fever or pain, unless another medicine was prescribed for this. If you have chronic liver or kidney disease, talk with your health care provider before using these medicines. Also talk with your provider if you've had a stomach ulcer or GI bleeding.   Your appetite may be poor, so follow a light diet. Drink 6 to 8 glasses of fluid every day. This will help you avoid dehydration. Fluids can include water, soft, drinks, juices, tea, and soup. Extra fluids will help loosen secretions in your lungs.   Over-the-counter cough medicines that containdextromethorphan may help relieve cough and congestion. Decongestants may also help with this. Don't use decongestants if you have high blood pressure.   Finish all antibiotic medicine. Do this even if you are feeling better after only a few days.  Follow-up care  Follow up with your health care provider, or as advised, if you don't start to feel better after 3 days.  You should get a pneumococcal conjugate vaccine if you are 65 or older. This is also called a PCV or PCV13 vaccine. You should also have this vaccine if you have chronic asthma or COPD.  Talk with your health care provider about getting a pneumococcal polysaccharide vaccine (PPV or PPSV23) if you got a PCV13  vaccine today. You should also get a flu shot (influenza vaccine) every autumn.  When to seek medical advice  Call your health care provider right away if any of these occur:   Fever over 100.4F (38.0C) for more than 3 days   Trouble breathing, or you have wheezing or pain with breathing   Coughing up blood or increased amounts of colored sputum   Weakness, drowsiness, headache, facial pain, ear pain, or a stiff neck   2000-2015 [employer] [address], [place] [address]. All rights reserved. This information is not intended as a substitute for professional medical care. Always follow your healthcare professional's instructions.

## 2014-11-29 ENCOUNTER — Encounter: Payer: Self-pay | Admitting: Family Medicine

## 2014-12-03 ENCOUNTER — Other Ambulatory Visit: Payer: Self-pay | Admitting: Family Medicine

## 2014-12-03 ENCOUNTER — Telehealth (INDEPENDENT_AMBULATORY_CARE_PROVIDER_SITE_OTHER): Payer: Self-pay | Admitting: Family Medicine

## 2014-12-03 ENCOUNTER — Encounter (INDEPENDENT_AMBULATORY_CARE_PROVIDER_SITE_OTHER): Payer: Self-pay | Admitting: Family Medicine

## 2014-12-03 DIAGNOSIS — F419 Anxiety disorder, unspecified: Secondary | ICD-10-CM

## 2014-12-03 DIAGNOSIS — M5136 Other intervertebral disc degeneration, lumbar region: Secondary | ICD-10-CM

## 2014-12-03 MED ORDER — TRAMADOL HCL 50 MG OR TABS
50.0000 mg | ORAL_TABLET | Freq: Four times a day (QID) | ORAL | Status: DC | PRN
Start: 2014-12-03 — End: 2015-02-15

## 2014-12-03 MED ORDER — ALPRAZOLAM 0.5 MG OR TABS
0.2500 mg | ORAL_TABLET | Freq: Two times a day (BID) | ORAL | Status: DC | PRN
Start: 2014-12-03 — End: 2015-04-15

## 2014-12-03 NOTE — Telephone Encounter (Signed)
Medication approved.  Please send to pharmacy.

## 2014-12-03 NOTE — Telephone Encounter (Signed)
New encounter opening for tramadol.  Refill encounter originally came from pharmacy, but due to system, unable to complete refill in that encounter so needing to open a new one.      Refill per pharmacy request.  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    Last seen for low back: 10/04/13 with CPE

## 2014-12-03 NOTE — Telephone Encounter (Signed)
Approved.  Please fax to pharmacy.

## 2014-12-03 NOTE — Telephone Encounter (Signed)
Refill per pharmacy request.  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    Last seen for low back: 10/04/13 with CPE

## 2014-12-03 NOTE — Telephone Encounter (Signed)
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

## 2014-12-03 NOTE — Telephone Encounter (Signed)
Rx alprazolam faxed to [facility].

## 2014-12-03 NOTE — Telephone Encounter (Signed)
Rx tramadol 50 mg faxed to [facility]

## 2014-12-15 ENCOUNTER — Encounter (INDEPENDENT_AMBULATORY_CARE_PROVIDER_SITE_OTHER): Payer: Self-pay | Admitting: Family Medicine

## 2014-12-15 ENCOUNTER — Other Ambulatory Visit: Payer: Self-pay

## 2015-01-23 ENCOUNTER — Other Ambulatory Visit: Payer: Self-pay | Admitting: Family Medicine

## 2015-01-23 ENCOUNTER — Other Ambulatory Visit (INDEPENDENT_AMBULATORY_CARE_PROVIDER_SITE_OTHER): Payer: Self-pay | Admitting: Family Medicine

## 2015-01-23 DIAGNOSIS — I10 Essential (primary) hypertension: Secondary | ICD-10-CM

## 2015-01-23 DIAGNOSIS — M1A00X0 Idiopathic chronic gout, unspecified site, without tophus (tophi): Secondary | ICD-10-CM

## 2015-01-23 DIAGNOSIS — E785 Hyperlipidemia, unspecified: Secondary | ICD-10-CM

## 2015-01-23 DIAGNOSIS — E782 Mixed hyperlipidemia: Secondary | ICD-10-CM

## 2015-01-23 DIAGNOSIS — F331 Major depressive disorder, recurrent, moderate: Secondary | ICD-10-CM

## 2015-01-23 NOTE — Telephone Encounter (Signed)
Refill Request allopurinol, enalapril, bupropion, venlafaxine, simvastatin    Last visit with PCP: 09/06/14  Next visit: none  Labs: Lipid, CMP, uric acid : 05/08/14  Bilirubin: 10/04/13    Last refill: 09/07/14    Diagnosis:  Allopurinol: M1A.00X0  Bupropion: F33.1  Enalapril: I10  Venlafaxine: F33.1  Simvastatin: E78.5

## 2015-01-23 NOTE — Telephone Encounter (Signed)
Refill per pharmacy request.  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

## 2015-01-23 NOTE — Telephone Encounter (Signed)
Unable to fill medications electronically in this encounter due to system error.  Rx's pended in new refill encounter.  Closing this encounter.

## 2015-01-24 MED ORDER — SIMVASTATIN 40 MG OR TABS
20.0000 mg | ORAL_TABLET | Freq: Every day | ORAL | Status: DC
Start: 2015-01-24 — End: 2015-08-16

## 2015-01-24 MED ORDER — ALLOPURINOL 100 MG OR TABS
100.0000 mg | ORAL_TABLET | Freq: Every day | ORAL | Status: DC
Start: 2015-01-24 — End: 2015-08-16

## 2015-01-24 MED ORDER — ENALAPRIL MALEATE 10 MG OR TABS
5.0000 mg | ORAL_TABLET | Freq: Every day | ORAL | Status: DC
Start: 2015-01-24 — End: 2015-08-16

## 2015-01-24 MED ORDER — VENLAFAXINE HCL ER 75 MG OR CP24
75.0000 mg | EXTENDED_RELEASE_CAPSULE | Freq: Every day | ORAL | Status: DC
Start: 2015-01-24 — End: 2015-08-16

## 2015-01-24 MED ORDER — BUPROPION HCL ER (SR) 150 MG OR TB12
150.0000 mg | EXTENDED_RELEASE_TABLET | Freq: Two times a day (BID) | ORAL | Status: DC
Start: 2015-01-24 — End: 2015-07-09

## 2015-02-05 ENCOUNTER — Encounter (INDEPENDENT_AMBULATORY_CARE_PROVIDER_SITE_OTHER): Payer: Self-pay | Admitting: Family Medicine

## 2015-02-05 DIAGNOSIS — L309 Dermatitis, unspecified: Secondary | ICD-10-CM

## 2015-02-05 MED ORDER — MUPIROCIN 2 % EX OINT
1.0000 | TOPICAL_OINTMENT | Freq: Three times a day (TID) | CUTANEOUS | Status: DC
Start: 2015-02-05 — End: 2015-08-16

## 2015-02-05 NOTE — Telephone Encounter (Signed)
See eCare message: patient requesting Rx for mupirocin for her chin.  States she has used it in the past, but may have received it from a diff doctor.  No med in our record/list.    Patient has CPE scheduled for 02/19/15.

## 2015-02-10 ENCOUNTER — Other Ambulatory Visit (INDEPENDENT_AMBULATORY_CARE_PROVIDER_SITE_OTHER): Payer: Self-pay | Admitting: Gynecology

## 2015-02-10 DIAGNOSIS — Z780 Asymptomatic menopausal state: Secondary | ICD-10-CM

## 2015-02-11 MED ORDER — VIVELLE-DOT 0.0375 MG/24HR TD PTTW
MEDICATED_PATCH | TRANSDERMAL | Status: DC
Start: 2015-02-11 — End: 2015-03-22

## 2015-02-11 NOTE — Telephone Encounter (Signed)
Last seen 10/31/13  Last refill 08/05/14

## 2015-02-13 ENCOUNTER — Ambulatory Visit (INDEPENDENT_AMBULATORY_CARE_PROVIDER_SITE_OTHER): Payer: BLUE CROSS/BLUE SHIELD | Admitting: Podiatrist

## 2015-02-13 ENCOUNTER — Encounter (INDEPENDENT_AMBULATORY_CARE_PROVIDER_SITE_OTHER): Payer: Self-pay | Admitting: Podiatrist

## 2015-02-13 DIAGNOSIS — M79671 Pain in right foot: Secondary | ICD-10-CM

## 2015-02-13 DIAGNOSIS — M84374A Stress fracture, right foot, initial encounter for fracture: Secondary | ICD-10-CM

## 2015-02-13 NOTE — Progress Notes (Signed)
Lindsey [name]  PCP:  Rothmier, Liane Comber, MD    CC:  64 year old female with   Chief Complaint   Patient presents with   . Foot Pain     NP - R Foot dorsal pain across metatarsals, swelling ongoing for the last week or so, did a small walk, painful afterwards.          HPI:  About a week and a half ago patient started having severe pain in her right foot when she woke up in the morning.  The night before she didn't a short walk nothing excessive.  In the months leading up to that she had switched shoes to a pair of new balance shoes that were fairly flexible through the midfoot.  She also relates gaining a lot of weight due to life stressors in recent months.  She localizes the pain on the dorsal mid foot right side near the central metatarsals.  She seen some swelling.  It's hard for her to push off her foot.  She's quite frustrated and has a number of other life stressors going on with some sick family in the [place] area I believe.    Review of patient's allergies indicates:  Allergies   Allergen Reactions   . Pneumovax [Pneumococcal Polysaccharide Vaccine] Fever, Nausea/Vomiting and Swelling       has a current medication list which includes the following prescription(s): allopurinol, alprazolam, aspirin, bupropion hcl (sr), vitamin d3, diclofenac sodium, enalapril maleate, fluticasone propionate, guaifenesin-codeine, multiple vitamin, mupirocin, fish oil, simvastatin, tramadol hcl, trazodone hcl, triamterene-hctz, venlafaxine hcl er, vitamin e, vivelle-dot, and zostavax.     has a past medical history of Anxiety state; Generalized osteoarthrosis, unspecified site; Disorder of menstrual bleeding; Stomach disorder; Depressive disorder, not elsewhere classified; Unspecified essential hypertension; Lipidemia; Disorder of muscle, ligament, and fascia; Multiparity; Leiomyoma of body of uterus (1996); and Osteoporosis.     has past surgical history that includes TOTAL ABDOMINAL HYSTERECT W/WO RMVL TUBE  OVARY (1998); CHOLECYSTECTOMY (1990's); Back surgery (2004); and MOHS ANY STAGE > 5 SPEC EACH (2012, 2013).    family history includes Alcohol/Drug in her sister and son; Cancer in her brother, paternal grandfather, and sister; Depression in her mother, sister, and son; Hypertension in her father, maternal grandmother, mother, and paternal grandmother; Thyroid Disease in her sister.    History     Social History   . Marital Status: Married     Spouse Name: N/A   . Number of Children: N/A   . Years of Education: N/A     Occupational History   . Not on file.     Social History Main Topics   . Smoking status: Former Smoker -- 1.00 packs/day for 40 years     Types: Cigarettes     Quit date: 05/05/2011   . Smokeless tobacco: Never Used   . Alcohol Use: 0.0 oz/week     0-3 Glasses of wine per week   . Drug Use: No   . Sexual Activity:     Partners: Male     Birth Control/ Protection: Surgical     Other Topics Concern   . Not on file     Social History Narrative    10/04/13: Married, 3 children.  Not working currently.         ROS:  ROS is positive for musculoskeletal as mentioned in the HPI.  All other symptoms reviewed are negative and noncontributory.  Documented in EHR on 02/13/2015  PHYSICAL EXAM    There were no vitals taken for this visit.    GENERAL: Alert and oriented to person, place, and time.  No acute distress.    VASCULAR:  Palpable dorsalis pedis and posterior tibial pulses, capillary refill immediate to toes.  No signs of ischemia.    NEUROLOGICAL:  Epicritic sensation intact, no neuritic symptoms provoked with exam.    DERMATOLOGICAL:  No skin lesions, no erythema, no increased warmth.    MUSCULOSKELETAL:   She is intensely painful to palpation over the third metatarsal and to a lesser extent the fourth metatarsal on the right foot.  The second metatarsals nontender nor is the first or the fifth.  There is some swelling over the dorsal midfoot.  She has a very antalgic gait can really put weight through  her forefoot and push off.    IMAGING:  X-ray Foot 3+ Vw Right    02/13/2015   3 views of the right foot show some periosteal callus formation on the medial aspect of the third metatarsal and a very subtle hint of a transverse [place] just proximal to the main portion of the callus.  The fourth metatarsal appears normal.    ASSESSMENT:  (S93.734K) Stress fracture of metatarsal bone, right, initial encounter  (primary encounter diagnosis)  (M79.671) Pain in right foot    PLAN:  Patient education on the nature of the problem and options with respect to treatment.    Recommended a walking boot to prevent the propulsive phase of gait.  This will help this to have stress relief and start to heal.  We'll plan on seeing her back in about 3 weeks.    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  She'll try to get that improved prior to seeing her back.  She may need some orthotics in the future as well.  When I see her back in 3 weeks we'll make some longer-term plans to help this have better support in the future.  A lot of this will probably come down to shoes.  It will probably help her to lose some weight as well as her body mass index is around 40.    [name], DPM  [facility]    Portions of this document were created with voice-recognition software and may contain occasional errant or misspelled words.

## 2015-02-15 ENCOUNTER — Encounter (INDEPENDENT_AMBULATORY_CARE_PROVIDER_SITE_OTHER): Payer: Self-pay | Admitting: Family Medicine

## 2015-02-15 ENCOUNTER — Ambulatory Visit (INDEPENDENT_AMBULATORY_CARE_PROVIDER_SITE_OTHER): Payer: BLUE CROSS/BLUE SHIELD | Admitting: Family Medicine

## 2015-02-15 VITALS — BP 136/80 | HR 81 | Ht 70.08 in | Wt 272.0 lb

## 2015-02-15 DIAGNOSIS — M5136 Other intervertebral disc degeneration, lumbar region: Secondary | ICD-10-CM

## 2015-02-15 DIAGNOSIS — M5432 Sciatica, left side: Secondary | ICD-10-CM

## 2015-02-15 MED ORDER — PREDNISONE 20 MG OR TABS
ORAL_TABLET | ORAL | Status: DC
Start: 2015-02-15 — End: 2015-08-16

## 2015-02-15 MED ORDER — HYDROCODONE-ACETAMINOPHEN 5-325 MG OR TABS
1.0000 | ORAL_TABLET | Freq: Four times a day (QID) | ORAL | Status: DC | PRN
Start: 2015-02-15 — End: 2015-05-27

## 2015-02-15 NOTE — Patient Instructions (Signed)
Do not take with Advil, Aleve, ibuprofen or other nonsteroidal anti-inflammatory medication.  Take early in day and take with food.  If side effects are intolerable please stop taking medication.    Some side effects you may encounter:  Aggression  agitation  anxiety  swelling of the fingers, hands, feet, or lower legs  Abdominal or stomach cramping or burning   increased hunger      Consider PT/scooter/walker

## 2015-02-15 NOTE — Progress Notes (Signed)
Chief Complaint   Patient presents with   . Sciatica     Left side, O?G x 1.5 week, pt reports lower back issues that could be aggrivating it. Was told by [facility] to not wait and to come it. icing and heating with temp relief. when waking up in the am pain is 10/10 when standing up, feet will tingling and then go numb.        HPI: Lindsey [name] is a 64 year old female presenting with a one half week h/o low back pain as noted above. Onset was gradual with no injury.  History of thoracic vertebral fractures from a fall.  Has multiple levels rodded.  Has had intermittent low back pain since injury.  Has never had sciatica.  Describes sciatic pain as a burning in her left buttock which shoots down into her foot causing tingling.  Much worse in the morning with a 10 out of 10 pain.  Improves during the day.  Ibuprofen helps some.  Has seen Dr. Everette Rank recently for foot fracture.  Is currently in a walking boot.  He referred her to be evaluated for her sciatica.  Did start having sciatica prior to using the boot.  Denies bowel or bladder incontinence.  Did have lumbar x-rays 2013.  Never had an MRI.Marland Kitchen  Patient has complete physical with Dr. Marylin Crosby next week.    ROS: As per HPI, no other joint pain, stiffness, fevers, chills, malaise, rash, N/V/D/C, CP, SOB or other complaint on 14 point CPT based review of systems.     Past Medical History   Diagnosis Date   . Anxiety state    . Generalized osteoarthrosis, unspecified site    . Disorder of menstrual bleeding    . Stomach disorder    . Depressive disorder, not elsewhere classified    . Unspecified essential hypertension    . Lipidemia    . Disorder of muscle, ligament, and fascia    . Multiparity      adopted sister's child    . Leiomyoma of body of uterus 1996     acute severe bleeding- hyst   . Osteoporosis        Current Outpatient Prescriptions   Medication Sig Dispense Refill   . Allopurinol 100 MG Oral Tab Take 1 tablet (100 mg) by mouth daily. 90 tablet 0    . ALPRAZolam 0.5 MG Oral Tab Take 0.5-1 tablets (0.25-0.5 mg) by mouth 2 times a day as needed for anxiety. for anxiety 10 tablet 0   . Aspirin 81 MG Oral Tab 1 TABLET DAILY     . BuPROPion HCl, SR, 150 MG Oral TABLET SR 12 HR Take 1 tablet (150 mg) by mouth 2 times a day. 180 tablet 0   . Cholecalciferol (VITAMIN D3) 2000 UNITS Oral Tab Take 1 tablet Daily     . Diclofenac Sodium 75 MG Oral Tab EC Take 1 tablet (75 mg) by mouth 2 times a day as needed for pain. 60 tablet 1   . Enalapril Maleate 10 MG Oral Tab Take 0.5 tablets (5 mg) by mouth daily. 45 tablet 0   . Fluticasone Propionate 50 MCG/ACT Nasal Suspension Spray in each nostril daily     . Guaifenesin-Codeine 100-10 MG/5ML Oral Syrup Take 10 mL by mouth at bedtime as needed. 118 mL 0   . Hydrocodone-Acetaminophen 5-325 MG Oral Tab Take 1 tablet by mouth every 6 hours as needed for pain. For Pain. 20 tablet 0   .  Multiple Vitamin Oral Tab Take 1 tablet Daily     . Mupirocin 2 % External Ointment Apply 1 application topically 3 times a day. Apply to chin 22 g 0   . Omega-3 Fatty Acids (FISH OIL) 1000 MG Oral Cap Take 1 cap Daily     . PredniSONE 20 MG Oral Tab 2 tabs PO qd for 4 days, 1 tab PO qd for 6 days. 14 tablet 0   . Simvastatin 40 MG Oral Tab Take 0.5 tablets (20 mg) by mouth daily. 45 tablet 0   . TraZODone HCl 50 MG Oral Tab Take 1-2 tablets (50-100 mg) by mouth at bedtime as needed for sleep. 180 tablet 1   . Triamterene-HCTZ 37.5-25 MG Oral Tab Take 1 tablet by mouth daily. 90 tablet 1   . Venlafaxine HCl ER 75 MG Oral CAPSULE SR 24 HR Take 1 capsule (75 mg) by mouth daily. 90 capsule 0   . Vitamin E 400 UNITS Oral Tab Take 1 tablet Daily     . VIVELLE-DOT 0.0375 MG/24HR Transdermal PATCH BIWEEKLY PLACE 1 PATCH ON THE SKIN ON ABDOMEN OR BUTTOCKS 2 TIMES A WEEK. REMOVE OLD PATCH WHEN REPLACING WITH NEW PATCH 8 patch 0   . ZOSTAVAX 51761 UNT/0.65ML Subcutaneous Recon Soln   0     No current facility-administered medications for this visit.        Review of patient's allergies indicates:  Allergies   Allergen Reactions   . Pneumovax [Pneumococcal Polysaccharide Vaccine] Fever, Nausea/Vomiting and Swelling       Family History   Problem Relation Age of Onset   . Hypertension Mother    . Hypertension Father    . Cancer Sister    . Thyroid Disease Sister    . Cancer Brother    . Hypertension Maternal Grandmother    . Hypertension Paternal Grandmother    . Cancer Paternal Grandfather    . Alcohol/Drug Sister      alcoholism/ drug addiction   . Alcohol/Drug Son      alcoholism/ drug addiction   . Depression Mother    . Depression Sister    . Depression Son        History     Social History Narrative    10/04/13: Married, 3 children.  Not working currently.       PE: BP 136/80 mmHg  Pulse 81  Ht 5' 10.08" (1.78 m)  Wt 272 lb (123.378 kg)  BMI 38.94 kg/m2  GEN: NAD, AO x 4, cooperative with exam, comfortable  Back exam:  -- Inspection: No deformity,  - Palpation: No tenderness along the spinous processes of the L/S spine.  Minimal paraspinous muscle spasm noted.  Tender left SI joint  -- ROM: Dorsolumbar flexion to 110 degrees. Extension to 10 degrees. Lateral tilt to 15 degrees.  -- Strength: WNL  -- Special tests: Ipsilateral supine straight leg raise positive.  -- Neurovascular exam: Sensation intact to light touch. Distal pulses intact  DTRs 2/4+ patellar and achilles    L/S spine IMPRESSION:   1. Moderate compression L1 vertebral body of uncertain age.   2. Post fusion T11-L3.   3. Severe degenerative disc narrowing L5-S1.    Dictated and Verified by: Samule Ohm MD, [facility] 12/12/2012     A/P 1. Sciatica, left  Symptoms for one and half weeks.  May be aggravated by walking boot and altered gait but sciatica symptoms started prior to using the boot.  Due to acuity  of pain trial of prednisone taper and hydrocodone.  She will follow-up with her PCP next week.  Consider scooter or walker if continues.  Consider PT.  May need updated x-rays  as well.  - PredniSONE 20 MG Oral Tab; 2 tabs PO qd for 4 days, 1 tab PO qd for 6 days.  Dispense: 14 tablet; Refill: 0  - Hydrocodone-Acetaminophen 5-325 MG Oral Tab; Take 1 tablet by mouth every 6 hours as needed for pain. For Pain.  Dispense: 20 tablet; Refill: 0    2. DDD (degenerative disc disease), lumbar    - PredniSONE 20 MG Oral Tab; 2 tabs PO qd for 4 days, 1 tab PO qd for 6 days.  Dispense: 14 tablet; Refill: 0  - Hydrocodone-Acetaminophen 5-325 MG Oral Tab; Take 1 tablet by mouth every 6 hours as needed for pain. For Pain.  Dispense: 20 tablet; Refill: 0        Patient Instructions   Do not take with Advil, Aleve, ibuprofen or other nonsteroidal anti-inflammatory medication.  Take early in day and take with food.  If side effects are intolerable please stop taking medication.    Some side effects you may encounter:  Aggression  agitation  anxiety  swelling of the fingers, hands, feet, or lower legs  Abdominal or stomach cramping or burning   increased hunger      Consider PT/scooter/walker          Dictated using speech recognition technology and electronically signed by Zachary George, MD   This document may contain errors inherent to this technology. If you find any errors that might affect patient care, please contact our office as soon as possible.   Ph. [phone] Fx. [phone]

## 2015-02-19 ENCOUNTER — Encounter (INDEPENDENT_AMBULATORY_CARE_PROVIDER_SITE_OTHER): Payer: Self-pay | Admitting: Family Medicine

## 2015-02-19 ENCOUNTER — Other Ambulatory Visit: Payer: Self-pay | Admitting: Family Medicine

## 2015-02-19 ENCOUNTER — Ambulatory Visit (INDEPENDENT_AMBULATORY_CARE_PROVIDER_SITE_OTHER): Payer: BLUE CROSS/BLUE SHIELD | Admitting: Family Medicine

## 2015-02-19 VITALS — BP 135/82 | HR 91 | Temp 97.8°F | Ht 70.0 in | Wt 284.0 lb

## 2015-02-19 DIAGNOSIS — M1A00X0 Idiopathic chronic gout, unspecified site, without tophus (tophi): Secondary | ICD-10-CM

## 2015-02-19 DIAGNOSIS — E782 Mixed hyperlipidemia: Secondary | ICD-10-CM

## 2015-02-19 DIAGNOSIS — Z130 Encounter for screening for diseases of the blood and blood-forming organs and certain disorders involving the immune mechanism: Secondary | ICD-10-CM

## 2015-02-19 DIAGNOSIS — Z1329 Encounter for screening for other suspected endocrine disorder: Secondary | ICD-10-CM

## 2015-02-19 DIAGNOSIS — F331 Major depressive disorder, recurrent, moderate: Secondary | ICD-10-CM

## 2015-02-19 DIAGNOSIS — I10 Essential (primary) hypertension: Secondary | ICD-10-CM

## 2015-02-19 DIAGNOSIS — Z0000 Encounter for general adult medical examination without abnormal findings: Secondary | ICD-10-CM

## 2015-02-19 DIAGNOSIS — L03211 Cellulitis of face: Secondary | ICD-10-CM

## 2015-02-19 DIAGNOSIS — Z87891 Personal history of nicotine dependence: Secondary | ICD-10-CM

## 2015-02-19 DIAGNOSIS — Z131 Encounter for screening for diabetes mellitus: Secondary | ICD-10-CM

## 2015-02-19 LAB — THYROID STIMULATING HORMONE: Thyroid Stimulating Hormone: 3.196 u[IU]/mL (ref 0.400–5.000)

## 2015-02-19 LAB — COMPREHENSIVE METABOLIC PANEL
ALT (GPT): 27 U/L (ref 7–33)
AST (GOT): 18 U/L (ref 9–38)
Albumin: 4.2 g/dL (ref 3.5–5.2)
Alkaline Phosphatase (Total): 80 U/L (ref 31–132)
Anion Gap: 8 (ref 4–12)
Bilirubin (Total): 0.3 mg/dL (ref 0.2–1.3)
Calcium: 9.5 mg/dL (ref 8.9–10.2)
Carbon Dioxide, Total: 28 mEq/L (ref 22–32)
Chloride: 103 mEq/L (ref 98–108)
Creatinine: 1.11 mg/dL — ABNORMAL HIGH (ref 0.38–1.02)
GFR, Calc, African American: >60 mL/min (ref >59)
GFR, Calc, European American: 50 mL/min — ABNORMAL LOW (ref >59)
Glucose: 88 mg/dL (ref 62–125)
Potassium: 4.2 mEq/L (ref 3.6–5.2)
Protein (Total): 6.9 g/dL (ref 6.0–8.2)
Sodium: 139 mEq/L (ref 135–145)
Urea Nitrogen: 19 mg/dL (ref 8–21)

## 2015-02-19 LAB — LIPID PANEL
Cholesterol (LDL): 121 mg/dL (ref <130)
Cholesterol/HDL Ratio: 3.3
HDL Cholesterol: 69 mg/dL (ref >39)
Non-HDL Cholesterol: 158 mg/dL (ref 0–159)
Total Cholesterol: 227 mg/dL — ABNORMAL HIGH (ref <200)
Triglyceride: 184 mg/dL — ABNORMAL HIGH (ref <150)

## 2015-02-19 LAB — CBC, DIFF
% Basophils: 1 %
% Eosinophils: 2 %
% Immature Granulocytes: 0 %
% Lymphocytes: 36 %
% Monocytes: 8 %
% Neutrophils: 53 %
Absolute Eosinophil Count: 0.18 10*3/uL (ref 0.00–0.50)
Absolute Lymphocyte Count: 3.59 10*3/uL (ref 1.00–4.80)
Basophils: 0.07 10*3/uL (ref 0.00–0.20)
Hematocrit: 40 % (ref 36–45)
Hemoglobin: 12.7 g/dL (ref 11.5–15.5)
Immature Granulocytes: 0.03 10*3/uL (ref 0.00–0.05)
MCH: 29.7 pg (ref 27.3–33.6)
MCHC: 31.7 g/dL — ABNORMAL LOW (ref 32.2–36.5)
MCV: 94 fL (ref 81–98)
Monocytes: 0.74 10*3/uL (ref 0.00–0.80)
Neutrophils: 5.32 10*3/uL (ref 1.80–7.00)
Platelet Count: 353 10*3/uL (ref 150–400)
RBC: 4.28 mil/uL (ref 3.80–5.00)
RDW-CV: 14.5 % — ABNORMAL HIGH (ref 11.6–14.4)
WBC: 9.93 10*3/uL (ref 4.3–10.0)

## 2015-02-19 LAB — T4, FREE: Thyroxine (Free): 0.7 ng/dL (ref 0.6–1.2)

## 2015-02-19 LAB — BILIRUBIN (DIRECT): Bilirubin (Direct): 0.1 mg/dL (ref 0.0–0.3)

## 2015-02-19 LAB — URIC ACID, SERUM: Uric Acid: 6.3 mg/dL (ref 2.6–6.8)

## 2015-02-19 NOTE — Progress Notes (Signed)
Lindsey [name] is a 64 year old female here today for a preventive health visit. Other problems or concerns today:     1.  Chin infection:  Started about a week ago.  She has these outbreaks rarely.  She gone 1 or 2 in her 18's, then again in her mid 35's, then recently.  She has been tried on antivirals, but mupirocin helps.  She has been on the mupirocin with help.     2.  Depression.  She feels she is doing well on her medications.  She is still taking bupropion and venlafaxine.   Occasionally she is needing alprazolam.    Over the last 2 weeks how often have you been bothered by any of the following problems?    1. Little interest or pleasure in doing things: Not at all  2. Feeling down, depressed or hopeless: Not at all  3. Trouble falling or staying asleep, or sleeping too much: Not at all  4. Feeling tired or having little energy : Not at all  5. Poor appetite or overeating: Several days  6. Feeling bad about yourself - or that you are a failure or have let yourself or your family down: Not at all  7. Trouble concentrating on things, such as reading the newspaper or watching television: Not at all  8. Moving or speaking much more slowly than usual.  Or the opposite - fidgety or restless: Not at all  9. Thoughts that you would be better off dead or of hurting yourself in some way: Not at all    Total  PHQ9 Total Score: 1    10. If you checked off any problems, how difficult have these problems made it for you to do your work, take care of things at home, or get along with other people?: Not difficult at all    3.  Obesity:  She is frustrated that she continues to stay obese.  She was walking more but unfortunately recently developed a right foot stress fracture for which she seen Dr. Nedra Hai.  She is never worked with the nutritionist.  She definitely feels that she's had a notable increase in her weight since quitting smoking.  She then she's gained about 60 pounds.    GYN: Dr. Debbrah Alar    CANCER  SCREENING  Family history of colon cancer: NO  Family history of uterine or ovarian cancer: NO  Family history of breast cancer: NO  Prior mammogram: YES 2013  History of abnormal mammogram: NO    LIFESTYLE  Current dietary habits: meals per day on average: 3 plus snacks  Calcium: calcium supplements:  YES  Current exercise habits: gardening and housecleaning  Regular seat belt use: YES  Substance use:  reports that she quit smoking about 3 years ago. Her smoking use included Cigarettes. She has a 40 pack-year smoking history. She has never used smokeless tobacco. She reports that she drinks alcohol. She reports that she does not use illicit drugs.   Exposure to hazardous materials: No  Guns in the house: Yes: Stored in cases    History sections of chart reviewed and updated today: YES    Review Of Systems:  Constitutional: Denies, fatigue, unexpected weight gain, fever, chills, night sweats  Regular dental exams: yes  Eye: Denies, blurry vision, diplopia, eye pain  ENT: Denies, hearing difficulties, nasal congestion and any ear nose or throat problems  Respiratory: Denies, dyspnea on exertion, dyspnea at rest, cough, wheezing  Cardiovascular: Denies, chest pain  or pressure at rest or during exercise, palpitations, .  Does have THROAT CLEARING  GI: Denies, change in appetite, dysphagia, nausea, vomiting, dyspepsia, abdominal pain, diarrhea, constipation, hematochezia, melena  GU: Denies, dysuria, hematuria, urinary incontinence, urinary urgency, vaginal discharge, abnormal vaginal bleeding, pelvic pain  Derm: Denies, skin rash, easy bruising, hair loss, changes in moles or new moles  Rheumatologic/Musculoskeletal: Denies, any joint or arthritic problems, arthralgias (back and knee), joint stiffness (knee and back)  Endocrine: Denies, polyuria, polydipsia, heat intolerance, cold intolerance, prior blood sugar problems, prior thyroid problems  Neurologic: Denies, headaches and tremors  Psych: Denies, depressed mood,  and anxiety       EXAM:  General: healthy, alert, no distress, cooperative  Skin: Skin color, texture, turgor normal. No rashes or concerning lesions  Head: Normocephalic. No masses, lesions, tenderness or abnormalities  Eyes: Lids/periorbital skin normal, Conjunctivae/corneas clear, PERRL, EOM's intact  Neck: supple. No adenopathy. Thyroid symmetric, normal size, without nodules  Lungs: clear to auscultation  Heart: normal rate, regular rhythm and no murmurs, clicks, or gallops  Breasts: not done: discussed recommendations for age, patient does BSE, patient declined  Abd: soft, non-tender. BS normal. No masses or organomegaly  GU: deferred  Rectal: deferred  Extremities: Normal, without deformities, edema, or skin discoloration, radial and DP pulses 2+ bilaterally  Neuro:  Grossly normal to observation, gait normal      ASSESSMENT:    64 yo female with:  Patient Active Problem List   Diagnosis   . Palpitations   . Hypertension   . Hyperlipidemia   . Gout   . Former smoker   . Sleep apnea   . Shortness of breath   . Basal cell carcinoma   . Squamous cell carcinoma   . Depression, major   . Lumbar degenerative disc disease   . Fracture, radius, distal   . Anxiety   . Cellulitis of face         PLAN:    HCM:  Check labs today including CBC, CMP, TSH, free T4, uric acid, lipids, hemoglobin A1c.  She is up-to-date on her colonoscopy, mammogram, and immunizations.  She is yet to undergo screening for lung cancer given her smoking history.  She was referred to [facility] for a CT of the lungs for lung cancer screening.    Hypertension: Stable on triamterene/HCTZ and enalapril.  Continue on these medications.    Hyperlipidemia: Recheck lipids today    Gout: Check uric acid.  Continue allopurinol.    Depression: She will continue on the venlafaxine, bupropion and occasional alprazolam.    Obesity: She will start using my fitness pal.  If she does not see any improvement the next step would be a nutrition  consult.    Dermatitis, chin: She seems to be responding to the mupirocin.  After she has complete resolution her chin, I've asked her to 2 weeks of nasal application twice a day to rid herself of any carrier state of staph.    F/U 1 year.    Health Maintenance   Topic Date Due   . Lung Cancer Screen  06/06/2006   . Mammogram  11/29/2016   . Cholesterol Test  05/09/2019   . Colonoscopy  12/05/2019   . Tetanus Vaccine  05/05/2021   . Zoster Vaccine  Completed   . Influenza Vaccine  Completed   . Hepatitis C Screen  Addressed   . HIV Screen  Addressed     Immunizations due: None  Screening tests  discussed: Diabetes (FBS if BP > 135/80) and Lipids  History sections of chart reviewed and updated today: YES    No diagnosis found.    The following were reviewed with patient:  Skin cancer prevention and self-exam and Vitamin D  Patient information given for the following: none    Follow up: 1 year  Lab tests and results of any diagnostic studies will be shared with the patient by  Gilford Silvius was seen today for a total of 15 minutes in addition to her preventative health exam.  Greater than 50% of this time was spent in face to face time, discussing her diagnosis, counseling and coordination of care.

## 2015-02-19 NOTE — Patient Instructions (Signed)
[employer]

## 2015-02-20 LAB — HEMOGLOBIN A1C, HPLC: Hemoglobin A1C: 5.9 % (ref 4.0–6.0)

## 2015-02-25 NOTE — Addendum Note (Signed)
Addended by: Tamala Fothergill on: 02/25/2015 04:56 PM     Modules accepted: Level of Service

## 2015-02-28 ENCOUNTER — Telehealth (INDEPENDENT_AMBULATORY_CARE_PROVIDER_SITE_OTHER): Payer: Self-pay | Admitting: Podiatrist

## 2015-02-28 ENCOUNTER — Other Ambulatory Visit (INDEPENDENT_AMBULATORY_CARE_PROVIDER_SITE_OTHER): Payer: Self-pay | Admitting: Family Medicine

## 2015-02-28 ENCOUNTER — Encounter (INDEPENDENT_AMBULATORY_CARE_PROVIDER_SITE_OTHER): Payer: Self-pay | Admitting: Family Medicine

## 2015-02-28 DIAGNOSIS — F329 Major depressive disorder, single episode, unspecified: Secondary | ICD-10-CM

## 2015-02-28 MED ORDER — TRAZODONE HCL 50 MG OR TABS
50.0000 mg | ORAL_TABLET | Freq: Every evening | ORAL | Status: DC | PRN
Start: 2015-02-28 — End: 2016-08-18

## 2015-02-28 NOTE — Telephone Encounter (Addendum)
Refill request per pharmacy  Trazodone 50 mg    Last seen 02/19/15 for CPE

## 2015-02-28 NOTE — Telephone Encounter (Signed)
Called and spoke with patient; she slipped last night while walking in with her dogs.  Her left knee twisted and is now having pain.  She is having a hard time wearing a boot on her right foot (seeing [facility] for her stress fractures) as it hurts her knee more.    She is seeing Dr. Terance Hart on 03/08/15 for her knee but is wondering if she should be seen sooner.  She is taking some advil.  Per Dr. Marylin Crosby, it would be ideal for patient to be seen sooner.  Spoke with Dr. Isabell Jarvis nurse and they will see her tomorrow.  Patient accepted appt.

## 2015-02-28 NOTE — Telephone Encounter (Signed)
Pt called and has set up an appointment for his next available appointment which was 3/18. She would like to know what she should do in the meantime to treat her knee that is injured. She is also walking on a fx'd foot currently since she can not wear her boot and does not have crutches.

## 2015-02-28 NOTE — Telephone Encounter (Signed)
Patient was seen by Dr. Nedra Hai 02/13/15 for [phone]) Stress fracture of metatarsal bone, right.  She has been in a walking boot and has a follow up appt 03/06/15.    Yesterday patient slipped and fell and injured her left knee.  She is seeing Dr. Rigoberto Noel tomorrow for her knee, but she is having a hard time wearing the right foot boot due to her left knee feeling unstable.    Is there anything else she should be doing?  Please advise.  She wanted to let Dr. Nedra Hai know what was going on.  She is not having any pain in her foot.

## 2015-03-01 ENCOUNTER — Ambulatory Visit (INDEPENDENT_AMBULATORY_CARE_PROVIDER_SITE_OTHER): Payer: BLUE CROSS/BLUE SHIELD | Admitting: Orthopaedic Surgery

## 2015-03-01 VITALS — BP 121/77 | HR 56 | Ht 70.0 in | Wt 284.0 lb

## 2015-03-01 DIAGNOSIS — M170 Bilateral primary osteoarthritis of knee: Secondary | ICD-10-CM | POA: Insufficient documentation

## 2015-03-01 NOTE — Progress Notes (Signed)
Chief Complaint  Left knee injury, medial pain, popping.    History  The patient is 64 year old female here for a new evaluation of left knee issues.    She has had previous episode of left knee pain after stepping into a hole in 20,008.  At the time she had an MRI showing medial meniscus tear.  She elected for conservative treatment and ultimately got her pain under control and relieved sufficiently that she was without any significant restrictions overall, but with some occasional pain medially.    On 02/27/15, she slipped and stopped her left lower extremity on hardwood floor, experiencing immediate sharp pain on the medial aspect of her left knee.  She has had difficulty walking normally since that time, with sharp pain medially.  She also feels some popping in clicking within the knee, in the medial and anteromedial aspect, associated with pain.  She did a lot of icing and it took Aleve, and since yesterday the pain and general symptoms are decreased and her mobility is increased area however she still is having to be very careful with walking.  She finds that she has to walk with the left lower shoulder with a more externally rotated to avoid sharp medial pain.    Past Medical History  Past Medical History   Diagnosis Date   . Anxiety state    . Generalized osteoarthrosis, unspecified site    . Disorder of menstrual bleeding    . Stomach disorder    . Depressive disorder, not elsewhere classified    . Unspecified essential hypertension    . Lipidemia    . Disorder of muscle, ligament, and fascia    . Multiparity      adopted sister's child    . Leiomyoma of body of uterus 1996     acute severe bleeding- hyst   . Osteoporosis        Past Surgical History  Past Surgical History   Procedure Laterality Date   . Total abdominal hysterect w/wo rmvl tube ovary  1998     BSO; fibroids   . Cholecystectomy  1990's     schincterotomy   . Back surgery  2004     thoracic fusion   . Mohs any stage > 5 spec each  2012, 2013     SCC R shin, BCC R leg/face       Medications  Outpatient Prescriptions Prior to Visit   Medication Sig Dispense Refill   . Allopurinol 100 MG Oral Tab Take 1 tablet (100 mg) by mouth daily. 90 tablet 0   . ALPRAZolam 0.5 MG Oral Tab Take 0.5-1 tablets (0.25-0.5 mg) by mouth 2 times a day as needed for anxiety. for anxiety 10 tablet 0   . Aspirin 81 MG Oral Tab 1 TABLET DAILY     . BuPROPion HCl, SR, 150 MG Oral TABLET SR 12 HR Take 1 tablet (150 mg) by mouth 2 times a day. 180 tablet 0   . Cholecalciferol (VITAMIN D3) 2000 UNITS Oral Tab Take 1 tablet Daily     . Diclofenac Sodium 75 MG Oral Tab EC Take 1 tablet (75 mg) by mouth 2 times a day as needed for pain. 60 tablet 1   . Enalapril Maleate 10 MG Oral Tab Take 0.5 tablets (5 mg) by mouth daily. 45 tablet 0   . Fluticasone Propionate 50 MCG/ACT Nasal Suspension Spray in each nostril daily     . Hydrocodone-Acetaminophen 5-325 MG Oral Tab Take  1 tablet by mouth every 6 hours as needed for pain. For Pain. 20 tablet 0   . Multiple Vitamin Oral Tab Take 1 tablet Daily     . Mupirocin 2 % External Ointment Apply 1 application topically 3 times a day. Apply to chin 22 g 0   . Omega-3 Fatty Acids (FISH OIL) 1000 MG Oral Cap Take 1 cap Daily     . PredniSONE 20 MG Oral Tab 2 tabs PO qd for 4 days, 1 tab PO qd for 6 days. 14 tablet 0   . Simvastatin 40 MG Oral Tab Take 0.5 tablets (20 mg) by mouth daily. 45 tablet 0   . TraZODone HCl 50 MG Oral Tab Take 1-2 tablets (50-100 mg) by mouth at bedtime as needed for sleep. For sleep 180 tablet 2   . Triamterene-HCTZ 37.5-25 MG Oral Tab Take 1 tablet by mouth daily. 90 tablet 1   . Venlafaxine HCl ER 75 MG Oral CAPSULE SR 24 HR Take 1 capsule (75 mg) by mouth daily. 90 capsule 0   . Vitamin E 400 UNITS Oral Tab Take 1 tablet Daily     . VIVELLE-DOT 0.0375 MG/24HR Transdermal PATCH BIWEEKLY PLACE 1 PATCH ON THE SKIN ON ABDOMEN OR BUTTOCKS 2 TIMES A WEEK. REMOVE OLD PATCH WHEN REPLACING WITH NEW PATCH 8 patch 0   . ZOSTAVAX  10272 UNT/0.65ML Subcutaneous Recon Soln   0     No facility-administered medications prior to visit.       Allergies  Review of patient's allergies indicates:  Allergies   Allergen Reactions   . Pneumovax [Pneumococcal Polysaccharide Vaccine] Fever, Nausea/Vomiting and Swelling         Social History  History     Social History   . Marital Status: Married     Spouse Name: N/A   . Number of Children: N/A   . Years of Education: N/A     Occupational History   . Not on file.     Social History Main Topics   . Smoking status: Former Smoker -- 1.00 packs/day for 40 years     Types: Cigarettes     Quit date: 05/05/2011   . Smokeless tobacco: Never Used   . Alcohol Use: 0.0 oz/week     0-3 Glasses of wine per week   . Drug Use: No   . Sexual Activity:     Partners: Male     Birth Control/ Protection: Surgical     Other Topics Concern   . Not on file     Social History Narrative    10/04/13: Married, 3 children.  Not working currently.       Family History  Family History   Problem Relation Age of Onset   . Hypertension Mother    . Depression Mother    . Hypertension Father    . Cancer Sister    . Thyroid Disease Sister    . Cancer Brother    . Hypertension Maternal Grandmother    . Hypertension Paternal Grandmother    . Cancer Paternal Grandfather    . Alcohol/Drug Sister      alcoholism/ drug addiction   . Alcohol/Drug Son      alcoholism/ drug addiction   . Cancer Son 74     colon-early stages   . Depression Sister    . Depression Son        Review of systems  Constitutional: Negative   Eyes: Negative   Ears, Nose,  Mouth, Throat: Negative   Cardiovascular: Negative   Respiratory: Negative   Gastrointestinal: Negative   Genitourinary: Negative   Musculoskeletal: As noted in HPI above   Skin: Negative   Neurological: Negative   Psychiatric: Negative   Endocrine: Negative   Hematologic/Lymphatic: Negative   Allergic/Immunologic: Negative     Physical Examination  The patient is 64 year old female in no acute distress and  is alert and oriented.     BP 121/77 mmHg  Pulse 56  Ht 5\' 10"  (1.778 m)  Wt 284 lb (128.822 kg)  BMI 40.75 kg/m2    The patient ambulates with antalgic gait favoring the left lower extremity.     The left knee exam shows range of motion from 10 degrees to 110 degrees of flexion. There is moderate effusion. Lachman exam is normal with a firm endpoint. Varus and valgus stressing induces no pain and demonstrates no laxity in both knees. There is small spot of medial joint line tenderness in the knee. Patellofemoral crepitance is moderate. Patellar stability within normal limits medially and laterally.    The right knee exam is done with all the same elements as above for the injured knee, and is generally normal throughout on inspection, palpation, range of motion, stability, and all provocative tests, except for presence also of mild to moderate patellofemoral crepitance and extension to about 5.     Skin exam shows no major abnormalities throughout both lower extremities. Distal neurovascular examination in both lower extremities is intact.     Imaging Studies  X-ray Knee 4+ View Left    03/01/2015   X-rays were obtained at [facility] today for evaluation of knee complaint, including AP, notch, and patellofemoral views of both knees and lateral view of the left knee, demonstrating severe degenerative joint disease, with near complete loss of joint space, in the lateral compartment and the patellofemoral compartment of the left knee, as well as in the medial compartment of the right knee.  Otherwise, no evidence of fracture, dislocation, or any other significant or acute bony or soft tissue abnormalities.       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  Recent contusion with a stumbling injury, resulting in pain, popping.  Date of injury 02/27/15, with report of improvement since yesterday with icing and Aleve.     Plan  We  discussed the potential for reinjury to her left knee, with her previous reported diagnosis of meniscus tear.  Her x-rays show findings consistent with arthritis, but more on the lateral compartment and not so much on the medial compartment where her pain is resided.    Overall symptoms are improving therefore I feel it is reasonable for her to continue to pursue conservative treatment at this point, as she is doing, with icing and Aleve and gradual return to activities.    If her symptoms are not sufficiently improved to reassure her in a week, or if her symptoms worsen any time before then, she is instructed to call us back for repeat evaluation.  At that visit we may consider further imaging study such as MRI to reevaluate her knee soft tissue condition, or to consider cortisone injection.    These with the above plan.  All her questions are answered.

## 2015-03-01 NOTE — Patient Instructions (Signed)
I will see the patient back in 1 week-2 weeks if no significant improvement, or sooner if problems arise/worsen.

## 2015-03-04 NOTE — Telephone Encounter (Signed)
Spoke to patient, her knee is doing much better and she states her foot is as well.  She hasn't been wearing the boot since her knee injury; she did buy more supportive shoes and states that has helped.  She requested we cancel her appointment this week and she will call to reschedule - she may have to travel out of town in the next few days for work.

## 2015-03-06 ENCOUNTER — Encounter (INDEPENDENT_AMBULATORY_CARE_PROVIDER_SITE_OTHER): Payer: BLUE CROSS/BLUE SHIELD | Admitting: Podiatrist

## 2015-03-08 ENCOUNTER — Encounter (INDEPENDENT_AMBULATORY_CARE_PROVIDER_SITE_OTHER): Payer: BLUE CROSS/BLUE SHIELD | Admitting: Orthopaedic Surgery

## 2015-03-22 ENCOUNTER — Other Ambulatory Visit (INDEPENDENT_AMBULATORY_CARE_PROVIDER_SITE_OTHER): Payer: Self-pay | Admitting: Gynecology

## 2015-03-22 DIAGNOSIS — Z780 Asymptomatic menopausal state: Secondary | ICD-10-CM

## 2015-03-22 NOTE — Telephone Encounter (Signed)
Last refill:02/11/15  Last appt:10/31/13    No future appts made.

## 2015-03-25 MED ORDER — ESTRADIOL 0.0375 MG/24HR TD PTTW
MEDICATED_PATCH | TRANSDERMAL | Status: DC
Start: 2015-03-25 — End: 2015-06-18

## 2015-03-25 NOTE — Telephone Encounter (Signed)
Left pt VM to call back office.

## 2015-04-14 ENCOUNTER — Other Ambulatory Visit (INDEPENDENT_AMBULATORY_CARE_PROVIDER_SITE_OTHER): Payer: Self-pay | Admitting: Family Medicine

## 2015-04-14 DIAGNOSIS — I10 Essential (primary) hypertension: Secondary | ICD-10-CM

## 2015-04-15 ENCOUNTER — Other Ambulatory Visit (INDEPENDENT_AMBULATORY_CARE_PROVIDER_SITE_OTHER): Payer: Self-pay | Admitting: Family Medicine

## 2015-04-15 DIAGNOSIS — F419 Anxiety disorder, unspecified: Secondary | ICD-10-CM

## 2015-04-15 MED ORDER — ALPRAZOLAM 0.5 MG OR TABS
0.2500 mg | ORAL_TABLET | Freq: Two times a day (BID) | ORAL | Status: DC | PRN
Start: 2015-04-15 — End: 2015-11-12

## 2015-04-15 MED ORDER — TRIAMTERENE-HCTZ 37.5-25 MG OR TABS
1.0000 | ORAL_TABLET | Freq: Every day | ORAL | Status: DC
Start: 2015-04-15 — End: 2015-11-12

## 2015-04-15 NOTE — Telephone Encounter (Signed)
Refill per pharmacy request.  Triamterene/HCTZ 37.5-25 mg    Last seen: 02/19/15 for CPE and labs (f/u 1 year)

## 2015-04-15 NOTE — Telephone Encounter (Signed)
Refill per pharmacy request.  Alprazolam 0.5 mg    Last seen: 02/19/15 for CPE and labs (f/u 1 year)

## 2015-04-15 NOTE — Telephone Encounter (Signed)
Approved.  Please send to the pharmacy.

## 2015-04-16 NOTE — Telephone Encounter (Signed)
Rx alprazolam 0.5 mg faxed to [facility] [place].

## 2015-05-02 ENCOUNTER — Other Ambulatory Visit (INDEPENDENT_AMBULATORY_CARE_PROVIDER_SITE_OTHER): Payer: Self-pay | Admitting: Family Medicine

## 2015-05-02 DIAGNOSIS — I10 Essential (primary) hypertension: Secondary | ICD-10-CM

## 2015-05-02 MED ORDER — ENALAPRIL MALEATE 5 MG OR TABS
ORAL_TABLET | ORAL | Status: DC
Start: 2015-05-02 — End: 2016-01-22

## 2015-05-02 NOTE — Telephone Encounter (Signed)
ORDERS PENDED: Enalapril 5 mg   Dx: Essential Hypertension    ICD-10: I10  Last Seen: 02/19/15 regarding CPE  Last Filled: 01/24/15            Requesting: Pharmacy

## 2015-05-19 ENCOUNTER — Other Ambulatory Visit (INDEPENDENT_AMBULATORY_CARE_PROVIDER_SITE_OTHER): Payer: Self-pay | Admitting: Family Medicine

## 2015-05-19 DIAGNOSIS — M5136 Other intervertebral disc degeneration, lumbar region: Secondary | ICD-10-CM

## 2015-05-21 ENCOUNTER — Encounter (INDEPENDENT_AMBULATORY_CARE_PROVIDER_SITE_OTHER): Payer: Self-pay | Admitting: Family Medicine

## 2015-05-21 MED ORDER — TRAMADOL HCL 50 MG OR TABS
50.0000 mg | ORAL_TABLET | Freq: Four times a day (QID) | ORAL | Status: DC | PRN
Start: 2015-05-21 — End: 2015-05-27

## 2015-05-21 NOTE — Telephone Encounter (Signed)
Rx tramadol 50 mg faxed to [facility]

## 2015-05-21 NOTE — Telephone Encounter (Signed)
ORDERS PENDED Tramadol    Last refill 12/03/14  Last seen 10/04/13    Dx   Lumbar degenerative disc disease - Primary     M51.36

## 2015-05-22 NOTE — Telephone Encounter (Signed)
Called and spoke with patient regarding her eCare message, since we will be out of office starting this afternoon until 05/25/15.  Patient states she is having worsening pain all over.  She has been taking her tramadol fairly regularly (100 mg about 3x/day).  She feels achy.  She does have an appt with Dr. Marylin Crosby on 05/29/15.    Per Dr. Marylin Crosby, she should be seen before we prescribe any stronger pain meds and patient understood.  Per dr. Marylin Crosby, she can take 100 mg of tramadol every 6 hours.  Also, offered appt for 05/27/15 at 3:30pm instead of 05/29/15 and she took this appt.  She was ok with waiting to talk to him and will try the tramadol q6 hours.

## 2015-05-27 ENCOUNTER — Ambulatory Visit (INDEPENDENT_AMBULATORY_CARE_PROVIDER_SITE_OTHER): Payer: BLUE CROSS/BLUE SHIELD | Admitting: Family Medicine

## 2015-05-27 ENCOUNTER — Encounter (INDEPENDENT_AMBULATORY_CARE_PROVIDER_SITE_OTHER): Payer: Self-pay | Admitting: Family Medicine

## 2015-05-27 VITALS — BP 148/82 | HR 87 | Ht 70.0 in | Wt 292.0 lb

## 2015-05-27 DIAGNOSIS — M5136 Other intervertebral disc degeneration, lumbar region: Secondary | ICD-10-CM

## 2015-05-27 DIAGNOSIS — M170 Bilateral primary osteoarthritis of knee: Secondary | ICD-10-CM

## 2015-05-27 MED ORDER — HYDROCODONE-ACETAMINOPHEN 5-325 MG OR TABS
1.0000 | ORAL_TABLET | Freq: Four times a day (QID) | ORAL | Status: DC | PRN
Start: 2015-05-27 — End: 2015-08-16

## 2015-05-27 MED ORDER — TRAMADOL HCL 50 MG OR TABS
50.0000 mg | ORAL_TABLET | Freq: Four times a day (QID) | ORAL | Status: DC | PRN
Start: 2015-05-27 — End: 2015-07-08

## 2015-05-27 NOTE — Patient Instructions (Addendum)
Tier 1:  Tylenol 1000 mg three times per day    Tier 2:  Tramadol 100 mg every 6 hours.    Tier 3:  Norco 5/325 1-2 as needed every 8 hours.    Anti-inflammatory diet

## 2015-05-27 NOTE — Progress Notes (Signed)
Chief Complaint   Patient presents with   . Joint Pain     Over the last few months, has had worsening pain in wrists, feet, back and knees (worse).  Causing more difficulty with day to day activities.  Taking tramadol and tylenol.  Concerned about nsaids due to kidney function.          SUBJECTIVE:  Lindsey [name] is a 64 year old female who presents today for follow-up of her bilateral knee degenerative joint disease, lumbar spondylosis, and chronic joint pain.  Ever since her kidney function started to elevate, she has been avoiding NSAIDs.  She has noted that overall she has worse pain in her back, knees, wrists and even her feet.  For the most part she's been able to control this with Tylenol 1000 mg twice a day, and occasionally she uses tramadol.  Unfortunately, last week and her pain intensified.  She was having pain in her shoulders, wrists, hands, back, knees, feet.  After a few days her symptoms improved.  She was taking Tylenol and a few hours later tramadol.  I did recommend that she start taking the tramadol scheduled, but by that time her symptoms had already started to improve.  She states that there is been no trauma.    Unfortunately, she continues to have difficulty exercising.  If she rides a stationary bike her knees hurt.  If she walks for long distances her back hurts.  As a result she's been unable to lose any weight.    ROS:  Other than those listed in HPI, all other review of systems are negative    Review of patient's allergies indicates:  Allergies   Allergen Reactions   . Pneumovax [Pneumococcal Polysaccharide Vaccine] Fever, Nausea/Vomiting and Swelling       Current Outpatient Prescriptions   Medication Sig Dispense Refill   . Allopurinol 100 MG Oral Tab Take 1 tablet (100 mg) by mouth daily. 90 tablet 0   . ALPRAZolam 0.5 MG Oral Tab Take 0.5-1 tablets (0.25-0.5 mg) by mouth 2 times a day as needed for anxiety. for anxiety 10 tablet 1   . Aspirin 81 MG Oral Tab 1 TABLET DAILY     .  BuPROPion HCl, SR, 150 MG Oral TABLET SR 12 HR Take 1 tablet (150 mg) by mouth 2 times a day. 180 tablet 0   . Cholecalciferol (VITAMIN D3) 2000 UNITS Oral Tab Take 1 tablet Daily     . Diclofenac Sodium 75 MG Oral Tab EC Take 1 tablet (75 mg) by mouth 2 times a day as needed for pain. 60 tablet 1   . Enalapril Maleate 10 MG Oral Tab Take 0.5 tablets (5 mg) by mouth daily. 45 tablet 0   . Enalapril Maleate 5 MG Oral Tab TAKE ONE TABLET BY MOUTH DAILY 90 tablet 3   . Estradiol 0.0375 MG/24HR Transdermal PATCH BIWEEKLY APPLY 1 PATCH TO SKIN ON ABDOMEN OR BUTTOCKS- REMOVE& REPLACE TWICE A WEEK. 8 patch 1   . Fluticasone Propionate 50 MCG/ACT Nasal Suspension Spray in each nostril daily     . Hydrocodone-Acetaminophen 5-325 MG Oral Tab Take 1 tablet by mouth every 6 hours as needed for pain. For SEVERE joint Pain. 30 tablet 0   . Multiple Vitamin Oral Tab Take 1 tablet Daily     . Mupirocin 2 % External Ointment Apply 1 application topically 3 times a day. Apply to chin 22 g 0   . Omega-3 Fatty Acids (FISH OIL) 1000 MG  Oral Cap Take 1 cap Daily     . PredniSONE 20 MG Oral Tab 2 tabs PO qd for 4 days, 1 tab PO qd for 6 days. 14 tablet 0   . Simvastatin 40 MG Oral Tab Take 0.5 tablets (20 mg) by mouth daily. 45 tablet 0   . TraMADol HCl 50 MG Oral Tab Take 1-2 tablets (50-100 mg) by mouth every 6 hours as needed for pain. For MODERATE-SEVERE pain. 40 tablet 5   . TraZODone HCl 50 MG Oral Tab Take 1-2 tablets (50-100 mg) by mouth at bedtime as needed for sleep. For sleep 180 tablet 2   . Triamterene-HCTZ 37.5-25 MG Oral Tab Take 1 tablet by mouth daily. 90 tablet 2   . Triamterene-HCTZ 37.5-25 MG Oral Tab Take 1 tablet by mouth daily. 90 tablet 1   . Venlafaxine HCl ER 75 MG Oral CAPSULE SR 24 HR Take 1 capsule (75 mg) by mouth daily. 90 capsule 0   . Vitamin E 400 UNITS Oral Tab Take 1 tablet Daily     . ZOSTAVAX 03474 UNT/0.65ML Subcutaneous Recon Soln   0     No current facility-administered medications for this visit.        Family History   Problem Relation Age of Onset   . Hypertension Mother    . Depression Mother    . Hypertension Father    . Cancer Sister    . Thyroid Disease Sister    . Cancer Brother    . Hypertension Maternal Grandmother    . Hypertension Paternal Grandmother    . Cancer Paternal Grandfather    . Alcohol/Drug Sister      alcoholism/ drug addiction   . Alcohol/Drug Son      alcoholism/ drug addiction   . Cancer Son 60     colon-early stages   . Depression Sister    . Depression Son        History     Social History   . Marital Status: Married     Spouse Name: N/A   . Number of Children: N/A   . Years of Education: N/A     Occupational History   . Not on file.     Social History Main Topics   . Smoking status: Former Smoker -- 1.00 packs/day for 40 years     Types: Cigarettes     Quit date: 05/05/2011   . Smokeless tobacco: Never Used   . Alcohol Use: 0.0 oz/week     0-3 Glasses of wine per week   . Drug Use: No   . Sexual Activity:     Partners: Male     Birth Control/ Protection: Surgical     Other Topics Concern   . Not on file     Social History Narrative    10/04/13: Married, 3 children.  Not working currently.         OBJECTIVE: Ht 5\' 10"  (1.778 m) Wt 292 lb (132.45 kg) Body mass index is 41.9 kg/(m^2). BP 148/82 mmHg  [name] 87  Ht 5\' 10"  (1.778 m)  Wt 292 lb (132.45 kg)  BMI 41.90 kg/m2  SpO2 97%   GEN:  Well developed, well nourished female in no acute distress, sitting comfortably in the exam room.  NEURO:  Alert and Oriented times three. Affect is normal.      ASSESSMENT:  64 year old healthy female with:  (M17.0) Osteoarthritis of both knees, unspecified osteoarthritis type  (primary encounter diagnosis)  (  M51.36) Degeneration of lumbar intervertebral disc  (M51.36) DDD (degenerative disc disease), lumbar    PLAN:  I had a long discussion today with Lindsey [name] regarding her chronic pain management.  I think it is reasonable for her to maintain a steady use of Tylenol.  She may take up to 3000 mg  daily.  Unfortunately, I would like her to avoid NSAIDs at all costs.  If the Tylenol is not sufficient for her pain, she may add tramadol 50-100 mg every 6 hours.  She may use this at the same time as her Tylenol.  If this is insufficient, she may want to try [place].  She is hesitant in using this as there is a family history of addiction.    We also discussed her bilateral knee degenerative joint disease.  She has fairly severe degenerative joint disease.  She would like to try nonoperative strategies as she would like to lose weight if at all possible.  Given that her knee is less acutely inflamed now, she may benefit from Visco supplementation.  We will attempt to get authorization for this.  Otherwise, a corticosteroid injection may help her in the short-term.      We also discussed several strategies for exercise.  She will attempt to lose weight.  I also discussed with her the potential benefit of an anti-inflammatory diet.  She will look into this.    She was given the opportunity to ask questions.  All questions were answered.    F/U:  PRN    Shawntina was seen today for a total of 25 minutes.  Greater than 50% of this time was spent in face to face time, discussing her diagnosis, counseling and coordination of care.        Nelda Marseille. Kaycee Haycraft, MD  [facility]  [address], [place] [address]  T: [phone]  F: [phone]      This document was generated in part using voice recognition technology.  Although every effort was made to insure accuracy, transcription errors may occur.

## 2015-05-28 ENCOUNTER — Telehealth (INDEPENDENT_AMBULATORY_CARE_PROVIDER_SITE_OTHER): Payer: Self-pay | Admitting: Family Medicine

## 2015-05-28 DIAGNOSIS — M170 Bilateral primary osteoarthritis of knee: Secondary | ICD-10-CM

## 2015-05-28 NOTE — Telephone Encounter (Signed)
Visco Order Request:    Provider would like to order visco for their patient.    Please research covered viscosupplementation for patient--Orthovisc #3 preferred    Bilateral knees     [employer]:  Private

## 2015-05-29 ENCOUNTER — Ambulatory Visit (INDEPENDENT_AMBULATORY_CARE_PROVIDER_SITE_OTHER): Payer: 59 | Admitting: Family Medicine

## 2015-05-29 NOTE — Telephone Encounter (Signed)
Per Dr. Marylin Crosby, Euflexxa is okay to use

## 2015-05-29 NOTE — Telephone Encounter (Signed)
Per [employer] [place] no prior authorization is required for bilateral Euflexxa please pend the order patient is scheduled to begin her series on 06/11/2015.

## 2015-05-29 NOTE — Telephone Encounter (Signed)
[employer] [place] preferred products for viscosupplementation are Euflexxa, Synvisc, or Synvisc One.  Orthovisc would only be covered if the patient has previously used one of the preferred products and the drug has been detrimental to the patients health or has been ineffective in treating the patients condition.

## 2015-05-29 NOTE — Telephone Encounter (Signed)
Order pended

## 2015-05-30 NOTE — Telephone Encounter (Signed)
Returned patients call regarding walking to [place] appointment  after visco injection per Dr. Marylin Crosby she will have no restrictions the next day after her 1st Euflexxa but if she was worried about it that we should reschedule, per patient she will leave the schedule as is.

## 2015-06-11 ENCOUNTER — Encounter (INDEPENDENT_AMBULATORY_CARE_PROVIDER_SITE_OTHER): Payer: Self-pay | Admitting: Family Medicine

## 2015-06-11 ENCOUNTER — Ambulatory Visit (INDEPENDENT_AMBULATORY_CARE_PROVIDER_SITE_OTHER): Payer: BLUE CROSS/BLUE SHIELD | Admitting: Family Medicine

## 2015-06-11 VITALS — BP 132/86 | Ht 70.0 in | Wt 292.0 lb

## 2015-06-11 DIAGNOSIS — M170 Bilateral primary osteoarthritis of knee: Secondary | ICD-10-CM

## 2015-06-11 DIAGNOSIS — H538 Other visual disturbances: Secondary | ICD-10-CM

## 2015-06-11 NOTE — Progress Notes (Signed)
Chief Complaint   Patient presents with   . Joint Injection     Euflexxa #1, bilateral knees.  Lot # [id number], exp 05/09/16   . Medication Management     Experienced blurry vision with taking tramadol more regularly.  Has mostly resolved since cutting back again.  Recent eye exam done in April        S:  Lindsey [name] is a 64 year old female with bilateral knee DJD, here for 1st Euflexxa injection her bilateral knees.     She is also here today regarding recent blurry vision.  She thought it was possibly related to taking tramadol.  Last week she started taking tramadol more regularly for her pain, then Sunday she started noticing some blurred vision.  It was almost bad enough to affect her driving.  She has since stopped taking tramadol and there is been some improvement, but not complete resolution.  She also states that she's been having some floaters in her eyes as recently as last few weeks.  Her last eye exam was in April.  She currently denies any eye pain.    O: BP 132/86 mmHg  Ht 5\' 10"  (1.778 m)  Wt 292 lb (132.45 kg)  BMI 41.90 kg/m2  GEN:  Well developed female in NAD.  Examination of both knees shows positive patellofemoral crepitus.    A/P: 64 year old female with:  1: Bilateral knee DJD:  She is here for her 1st Euflexxa knee injections.  See procedure note below.  She will follow up next week.    2.  Blurred vision with recent floaters: Given her floaters, I want to rule out an actual eye process.  I recommended that she contact her eye care provider and be seen.    PROCEDURE NOTE:  After discussion regarding risks and benefits, the patient elected to proceed today with viscosupplementation for the right knee.  Specific risks discussed were bleeding, infection, trauma to surrounding structures, and failure to successfully improve the patients symptoms.  The patient was then placed supine.  The knee was slightly bent with a towel under the knee.  The superior/lateral joint line was identified and marked  with a pen.  The area was then cleaned with betadine times three, and alcohol times three.  After ethyl chloride sprays was used to anesthetize the superficial skin, a sterile 22 G 1.5" needle was inserted into the knee joint while injecting the superficial soft tissue and deeper knee structures while injecting a total of 3 mL of 1% lidocaine without epinephrine.  After reaching the joint space, a small amount of joint fluid was aspirated to confirm location.  Then, using a sterile hemostat, the needle was kept in place and a new syring containing 2 cc of Euflexxa was attached to the needle.  The joint fluid was again aspirated, then the entire 2 cc of Euflexxa was injected without difficulty.  The needle was then removed and the area was cleaned and bandaged in normal fashion.  There were no complications.    After discussion regarding risks and benefits, the patient elected to proceed today with viscosupplementation for the left knee.  Specific risks discussed were bleeding, infection, trauma to surrounding structures, and failure to successfully improve the patients symptoms.  The patient was then placed supine.  The knee was slightly bent with a towel under the knee.  The superior/lateral joint line was identified and marked with a pen.  The area was then cleaned with betadine times three, and  alcohol times three.  After ethyl chloride sprays was used to anesthetize the superficial skin, a sterile 22 G 1.5" needle was inserted into the knee joint while injecting the superficial soft tissue and deeper knee structures while injecting a total of 3 mL of 1% lidocaine without epinephrine.  After reaching the joint space, a small amount of joint fluid was aspirated to confirm location.  Then, using a sterile hemostat, the needle was kept in place and a new syring containing 2 cc of Euflexxa was attached to the needle.  The joint fluid was again aspirated, then the entire 2 cc of Euflexxa was injected without  difficulty.  The needle was then removed and the area was cleaned and bandaged in normal fashion.  There were no complications.

## 2015-06-15 ENCOUNTER — Other Ambulatory Visit (INDEPENDENT_AMBULATORY_CARE_PROVIDER_SITE_OTHER): Payer: Self-pay | Admitting: Family Medicine

## 2015-06-15 DIAGNOSIS — F329 Major depressive disorder, single episode, unspecified: Secondary | ICD-10-CM

## 2015-06-17 MED ORDER — VENLAFAXINE HCL ER 75 MG OR CP24
75.0000 mg | EXTENDED_RELEASE_CAPSULE | Freq: Every day | ORAL | Status: DC
Start: 2015-06-17 — End: 2016-01-22

## 2015-06-17 NOTE — Telephone Encounter (Signed)
Refill per pharmacy request.  Venlafaxine ER 75 mg    Last seen 06/11/15 for bilateral knee euflexxa;  05/27/15 for joint pain  CPE 02/19/15

## 2015-06-18 ENCOUNTER — Other Ambulatory Visit (INDEPENDENT_AMBULATORY_CARE_PROVIDER_SITE_OTHER): Payer: Self-pay | Admitting: Gynecology

## 2015-06-18 ENCOUNTER — Encounter (INDEPENDENT_AMBULATORY_CARE_PROVIDER_SITE_OTHER): Payer: Self-pay | Admitting: Family Medicine

## 2015-06-18 ENCOUNTER — Ambulatory Visit (INDEPENDENT_AMBULATORY_CARE_PROVIDER_SITE_OTHER): Payer: BLUE CROSS/BLUE SHIELD | Admitting: Family Medicine

## 2015-06-18 VITALS — BP 120/86 | Ht 70.0 in | Wt 292.0 lb

## 2015-06-18 DIAGNOSIS — M170 Bilateral primary osteoarthritis of knee: Secondary | ICD-10-CM

## 2015-06-18 DIAGNOSIS — Z780 Asymptomatic menopausal state: Secondary | ICD-10-CM

## 2015-06-18 MED ORDER — ESTRADIOL 0.0375 MG/24HR TD PTTW
1.0000 | MEDICATED_PATCH | TRANSDERMAL | Status: DC
Start: 2015-06-20 — End: 2015-09-07

## 2015-06-18 NOTE — Telephone Encounter (Signed)
Last visit 2014

## 2015-06-18 NOTE — Telephone Encounter (Signed)
Needs to be seen for further refills

## 2015-06-18 NOTE — Progress Notes (Signed)
Chief Complaint   Patient presents with   . Joint Injection     Bilateral knee euflexxa #2,        S:  Lindsey [name] is a 64 year old female with bilateral knee DJD, here for 2nd Euflexxa injection her bilateral knees.    O: There were no vitals taken for this visit.  GEN:  Well developed female in NAD.  Examination of both knees shows positive patellofemoral crepitus.    A/P: 64 year old female with:  1: Bilateral knee DJD:  She is here for her 2nd Euflexxa knee injections.  See procedure note below.  She will follow up next week.    PROCEDURE NOTE:  After discussion regarding risks and benefits, the patient elected to proceed today with viscosupplementation for the right knee.  Specific risks discussed were bleeding, infection, trauma to surrounding structures, and failure to successfully improve the patients symptoms.  The patient was then placed supine.  The knee was slightly bent with a towel under the knee.  The superior/lateral joint line was identified and marked with a pen.  The area was then cleaned with betadine times three, and alcohol times three.  After ethyl chloride sprays was used to anesthetize the superficial skin, a sterile 22 G 1.5" needle was inserted into the knee joint while injecting the superficial soft tissue and deeper knee structures while injecting a total of 3 mL of 1% lidocaine without epinephrine.  After reaching the joint space, a small amount of joint fluid was aspirated to confirm location.  Then, using a sterile hemostat, the needle was kept in place and a new syring containing 2 cc of Euflexxa was attached to the needle.  The joint fluid was again aspirated, then the entire 2 cc of Euflexxa was injected without difficulty.  The needle was then removed and the area was cleaned and bandaged in normal fashion.  There were no complications.    After discussion regarding risks and benefits, the patient elected to proceed today with viscosupplementation for the left knee.  Specific risks  discussed were bleeding, infection, trauma to surrounding structures, and failure to successfully improve the patients symptoms.  The patient was then placed supine.  The knee was slightly bent with a towel under the knee.  The superior/lateral joint line was identified and marked with a pen.  The area was then cleaned with betadine times three, and alcohol times three.  After ethyl chloride sprays was used to anesthetize the superficial skin, a sterile 22 G 1.5" needle was inserted into the knee joint while injecting the superficial soft tissue and deeper knee structures while injecting a total of 3 mL of 1% lidocaine without epinephrine.  After reaching the joint space, a small amount of joint fluid was aspirated to confirm location.  Then, using a sterile hemostat, the needle was kept in place and a new syring containing 2 cc of Euflexxa was attached to the needle.  The joint fluid was again aspirated, then the entire 2 cc of Euflexxa was injected without difficulty.  The needle was then removed and the area was cleaned and bandaged in normal fashion.  There were no complications.

## 2015-06-25 ENCOUNTER — Encounter (INDEPENDENT_AMBULATORY_CARE_PROVIDER_SITE_OTHER): Payer: Self-pay | Admitting: Family Medicine

## 2015-06-25 ENCOUNTER — Ambulatory Visit (INDEPENDENT_AMBULATORY_CARE_PROVIDER_SITE_OTHER): Payer: BLUE CROSS/BLUE SHIELD | Admitting: Family Medicine

## 2015-06-25 VITALS — BP 128/88 | Ht 70.0 in | Wt 292.0 lb

## 2015-06-25 DIAGNOSIS — M170 Bilateral primary osteoarthritis of knee: Secondary | ICD-10-CM

## 2015-06-25 NOTE — Progress Notes (Signed)
Chief Complaint   Patient presents with   . Joint Injection     bilateral knee euflexxa #3;  Lot # [id number], exp 05/09/16       S:  Lindsey [name] is a 64 year old female with bilateral knee DJD, here for 3rd Euflexxa injection her bilateral knees.  She has noticed improvement with walking.  At the end of the day her knees feel better.    O: BP 128/88 mmHg  Ht 5\' 10"  (1.778 m)  Wt 292 lb (132.45 kg)  BMI 41.90 kg/m2  GEN:  Well developed female in NAD.  Examination of both knees shows positive patellofemoral crepitus.    A/P: 64 year old female with:  1: Bilateral knee DJD:  She is here for her 3rd Euflexxa knee injections.  See procedure note below.  She will follow up next week.    PROCEDURE NOTE:  After discussion regarding risks and benefits, the patient elected to proceed today with viscosupplementation for the right knee.  Specific risks discussed were bleeding, infection, trauma to surrounding structures, and failure to successfully improve the patients symptoms.  The patient was then placed supine.  The knee was slightly bent with a towel under the knee.  The superior/lateral joint line was identified and marked with a pen.  The area was then cleaned with betadine times three, and alcohol times three.  After ethyl chloride sprays was used to anesthetize the superficial skin, a sterile 22 G 1.5" needle was inserted into the knee joint while injecting the superficial soft tissue and deeper knee structures while injecting a total of 3 mL of 1% lidocaine without epinephrine.  After reaching the joint space, a small amount of joint fluid was aspirated to confirm location.  Then, using a sterile hemostat, the needle was kept in place and a new syring containing 2 cc of Euflexxa was attached to the needle.  The joint fluid was again aspirated, then the entire 2 cc of Euflexxa was injected without difficulty.  The needle was then removed and the area was cleaned and bandaged in normal fashion.  There were no  complications.    After discussion regarding risks and benefits, the patient elected to proceed today with viscosupplementation for the left knee.  Specific risks discussed were bleeding, infection, trauma to surrounding structures, and failure to successfully improve the patients symptoms.  The patient was then placed supine.  The knee was slightly bent with a towel under the knee.  The superior/lateral joint line was identified and marked with a pen.  The area was then cleaned with betadine times three, and alcohol times three.  After ethyl chloride sprays was used to anesthetize the superficial skin, a sterile 22 G 1.5" needle was inserted into the knee joint while injecting the superficial soft tissue and deeper knee structures while injecting a total of 3 mL of 1% lidocaine without epinephrine.  After reaching the joint space, a small amount of joint fluid was aspirated to confirm location.  Then, using a sterile hemostat, the needle was kept in place and a new syring containing 2 cc of Euflexxa was attached to the needle.  The joint fluid was again aspirated, then the entire 2 cc of Euflexxa was injected without difficulty.  The needle was then removed and the area was cleaned and bandaged in normal fashion.  There were no complications.

## 2015-07-08 ENCOUNTER — Other Ambulatory Visit (INDEPENDENT_AMBULATORY_CARE_PROVIDER_SITE_OTHER): Payer: Self-pay | Admitting: Family Medicine

## 2015-07-08 DIAGNOSIS — M1A00X0 Idiopathic chronic gout, unspecified site, without tophus (tophi): Secondary | ICD-10-CM

## 2015-07-08 DIAGNOSIS — E785 Hyperlipidemia, unspecified: Secondary | ICD-10-CM

## 2015-07-08 DIAGNOSIS — M5136 Other intervertebral disc degeneration, lumbar region: Secondary | ICD-10-CM

## 2015-07-09 ENCOUNTER — Other Ambulatory Visit (INDEPENDENT_AMBULATORY_CARE_PROVIDER_SITE_OTHER): Payer: Self-pay | Admitting: Family Medicine

## 2015-07-09 DIAGNOSIS — F331 Major depressive disorder, recurrent, moderate: Secondary | ICD-10-CM

## 2015-07-09 MED ORDER — ALLOPURINOL 100 MG OR TABS
100.0000 mg | ORAL_TABLET | Freq: Every day | ORAL | Status: DC
Start: 2015-07-09 — End: 2016-01-22

## 2015-07-09 MED ORDER — SIMVASTATIN 40 MG OR TABS
20.0000 mg | ORAL_TABLET | Freq: Every day | ORAL | Status: DC
Start: 2015-07-09 — End: 2016-02-07

## 2015-07-09 MED ORDER — TRAMADOL HCL 50 MG OR TABS
50.0000 mg | ORAL_TABLET | Freq: Four times a day (QID) | ORAL | Status: DC | PRN
Start: 2015-07-09 — End: 2016-05-15

## 2015-07-09 MED ORDER — BUPROPION HCL ER (SR) 150 MG OR TB12
150.0000 mg | EXTENDED_RELEASE_TABLET | Freq: Two times a day (BID) | ORAL | Status: DC
Start: 2015-07-09 — End: 2016-01-22

## 2015-07-09 NOTE — Telephone Encounter (Signed)
Rx tramadol 50 mg faxed to [facility]

## 2015-07-09 NOTE — Telephone Encounter (Signed)
Refill per pharmacy request.  Tramadol 50 mg  Simvastatin 40 mg  Allopurinol 100 mg    Last seen 06/25/15 for bilateral euflexxa injections  CPE 02/19/15 (f/u 1 year)

## 2015-07-09 NOTE — Telephone Encounter (Signed)
Refill per pharmacy request.  Bupropion SR 150 mg    Last seen 06/25/15 for bilateral euflexxa injections  CPE 02/19/15 (f/u 1 year)

## 2015-08-16 ENCOUNTER — Encounter (INDEPENDENT_AMBULATORY_CARE_PROVIDER_SITE_OTHER): Payer: Self-pay | Admitting: Family Medicine

## 2015-08-16 ENCOUNTER — Ambulatory Visit (INDEPENDENT_AMBULATORY_CARE_PROVIDER_SITE_OTHER): Payer: BLUE CROSS/BLUE SHIELD | Admitting: Family Medicine

## 2015-08-16 VITALS — BP 142/87 | HR 88 | Ht 70.0 in | Wt 292.0 lb

## 2015-08-16 DIAGNOSIS — M545 Low back pain, unspecified: Secondary | ICD-10-CM

## 2015-08-16 DIAGNOSIS — M47816 Spondylosis without myelopathy or radiculopathy, lumbar region: Secondary | ICD-10-CM

## 2015-08-16 MED ORDER — HYDROCODONE-ACETAMINOPHEN 5-325 MG OR TABS
1.0000 | ORAL_TABLET | Freq: Four times a day (QID) | ORAL | Status: DC | PRN
Start: 2015-08-16 — End: 2015-09-10

## 2015-08-16 NOTE — Progress Notes (Signed)
Chief Complaint   Patient presents with   . Low Back Pain     Increased pain low back over time.  Not improving.  Worse with walking.  Okay at nighttime usually.  No numbness/tingling/radiating pain.  Taking tramadol and hydrocodone for pain.        SUBJECTIVE:  Lindsey [name] is a 64 year old female who presents today for severe, debilitating low back pain.  He has a history of low back pain.  She sustained a spinal fracture in 2004 that required a fusion between T11 and L3.  Since then until now she's had recurrent low back pain, mostly muscular.  Over the past few months however she's been having an intense low back pain that minimally radiates to her glutes.  It is worse with standing or with walking.  She notes that she has to lean forward in order to eliminate some of the pain.  The pain is so intense now she can only walk about 5200 yards before she has to stop, rest, or sit down.  She has not noticed any bowel or bladder changes, saddle anesthesia, numbness, or tingling.  She's noted no weakness in her legs.  This is severely limiting her lifestyle, and it's bothering her at night.  She wakes up from a dead sleep with back pain.  She denies any recent falls.  She's taking Tylenol, tramadol, and occasionally hydrocodone for the pain.  She recently was on vacation and had difficulty walking through the airport.  She has found that walking in a pool has helped her pain.    ROS:  Other than those listed in HPI, all other review of systems are negative    Review of patient's allergies indicates:  Allergies   Allergen Reactions   . Pneumovax [Pneumococcal Polysaccharide Vaccine] Fever, Nausea/Vomiting and Swelling       Current Outpatient Prescriptions   Medication Sig Dispense Refill   . Allopurinol 100 MG Oral Tab Take 1 tablet (100 mg) by mouth daily. 90 tablet 3   . ALPRAZolam 0.5 MG Oral Tab Take 0.5-1 tablets (0.25-0.5 mg) by mouth 2 times a day as needed for anxiety. for anxiety 10 tablet 1   . Aspirin 81 MG  Oral Tab 1 TABLET DAILY     . BuPROPion HCl, SR, 150 MG Oral TABLET SR 12 HR Take 1 tablet (150 mg) by mouth 2 times a day. 180 tablet 6   . Cholecalciferol (VITAMIN D3) 2000 UNITS Oral Tab Take 1 tablet Daily     . Diclofenac Sodium 75 MG Oral Tab EC Take 1 tablet (75 mg) by mouth 2 times a day as needed for pain. 60 tablet 1   . Enalapril Maleate 5 MG Oral Tab TAKE ONE TABLET BY MOUTH DAILY 90 tablet 3   . Estradiol 0.0375 MG/24HR Transdermal PATCH BIWEEKLY Place 1 patch on the skin 2 times a week. 8 patch 1   . Fluticasone Propionate 50 MCG/ACT Nasal Suspension Spray in each nostril daily     . Hydrocodone-Acetaminophen 5-325 MG Oral Tab Take 1 tablet by mouth every 6 hours as needed for pain. For SEVERE joint Pain. 30 tablet 0   . Multiple Vitamin Oral Tab Take 1 tablet Daily     . Omega-3 Fatty Acids (FISH OIL) 1000 MG Oral Cap Take 1 cap Daily     . Simvastatin 40 MG Oral Tab Take 0.5 tablets (20 mg) by mouth daily. 45 tablet 3   . TraMADol HCl 50 MG Oral  Tab Take 1-2 tablets (50-100 mg) by mouth every 6 hours as needed for pain (moderate-severe pain). 40 tablet 2   . TraZODone HCl 50 MG Oral Tab Take 1-2 tablets (50-100 mg) by mouth at bedtime as needed for sleep. For sleep 180 tablet 2   . Triamterene-HCTZ 37.5-25 MG Oral Tab Take 1 tablet by mouth daily. 90 tablet 1   . Venlafaxine HCl ER 75 MG Oral CAPSULE SR 24 HR Take 1 capsule (75 mg) by mouth daily. 30 capsule 11   . Vitamin E 400 UNITS Oral Tab Take 1 tablet Daily       No current facility-administered medications for this visit.       Family History   Problem Relation Age of Onset   . Hypertension Mother    . Depression Mother    . Hypertension Father    . Cancer Sister    . Thyroid Disease Sister    . Cancer Brother    . Hypertension Maternal Grandmother    . Hypertension Paternal Grandmother    . Cancer Paternal Grandfather    . Alcohol/Drug Sister      alcoholism/ drug addiction   . Alcohol/Drug Son      alcoholism/ drug addiction   . Cancer Son  10     colon-early stages   . Depression Sister    . Depression Son        Social History     Social History   . Marital Status: Married     Spouse Name: N/A   . Number of Children: N/A   . Years of Education: N/A     Occupational History   . Not on file.     Social History Main Topics   . Smoking status: Former Smoker -- 1.00 packs/day for 40 years     Types: Cigarettes     Quit date: 05/05/2011   . Smokeless tobacco: Never Used   . Alcohol Use: 0.0 oz/week     0-3 Glasses of wine per week   . Drug Use: No   . Sexual Activity:     Partners: Male     Birth Control/ Protection: Surgical     Other Topics Concern   . Not on file     Social History Narrative    10/04/13: Married, 3 children.  Not working currently.         OBJECTIVE: Ht 5\' 10"  (1.778 m) Wt 292 lb (132.45 kg) Body mass index is 41.9 kg/(m^2). BP 142/87 mmHg  Pulse 88  Ht 5\' 10"  (1.778 m)  Wt 292 lb (132.45 kg)  BMI 41.90 kg/m2   GEN:  Well developed, well nourished female in no acute distress, sitting comfortably in the exam room.  MUSCULOSKELETAL:   Standing inspection reveals symmetry of the anterior and posterior pelvis and shoulders.  Lumbar Spine Exam:  -- Inspection: no deformity, no asymmetry, no bruising, no atrophy, mild bilateral paraspinous muscle spasm along the lumbar spine.  A midline surgical scar seen.  -- ROM: Dorsolumbar flexion to 80 degrees. Extension to 0-51 degrees. Lateral tilt to 0 degrees.  -- Strength: Hip flexion  5/5, Seated Knee extension 5/5, Seated Knee flexion  5/5, Foot dorsiflexion 5/5, Heel walking and toe walking intact.  -- Special tests: Ipsilateral supine straight leg raise neg. Contralateral supine straight leg raise neg. Neural slump test neg.  -- Palpation: mild tenderness along the spinous processes of the lower L/S spine. Mild bilateral paraspinous muscle spasm noted.  --  Neurovascular exam: Sensation intact to light touch and pinprick. Distal pulses intact  --Gait: She leans forward, and slightly  limps.    NEURO:  Alert and Oriented times three.  DTR's equal and symmetric.  Affect is normal.    Date of Service: 12/12/2012  EXAMINATION:   Five view lumbar spine     CLINICAL INFORMATION:   Fall with injury.     COMPARISON STUDY:   None     FINDINGS:   5 lumbar type vertebrae are present. There is spinal hardware   extending from T11-L3 there is moderate compression of the L1   vertebral body of uncertain age. There is fusion bone present from   T11-L3 bilaterally.     There is severe degenerative disc narrowing at L5-S1.     IMPRESSION:   1. Moderate compression L1 vertebral body of uncertain age.   2. Post fusion T11-L3.   3. Severe degenerative disc narrowing L5-S1.    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  Without trauma, I suspect there might be some slight progression of her lower lumbar DJD, and she already had severe degenerative disc disease at L5-S1.  Clinically, her symptoms are consistent with spinal stenosis.  Given the intense pain, inability walk more than 5200 yards, and nighttime pain I would like to proceed with an MRI of her lumbar spine.  For pain control and the interim, I did prescribe her Norco 5/325 one to 2 as needed every 4-6 hours.  She also sometimes takes tramadol which she continues for less intense pain.  I will see her back after the MRI and we will proceed from there.  I did provide her disabled parking permit today.    F/U:  After MRI      Nelda Marseille. Shebra Muldrow, MD  [facility]  [address], [place] [address]  T: [phone]  F: [phone]      This document was generated in part using voice recognition technology.  Although every effort was made to insure  accuracy, transcription errors may occur.

## 2015-08-23 ENCOUNTER — Ambulatory Visit (INDEPENDENT_AMBULATORY_CARE_PROVIDER_SITE_OTHER): Payer: 59

## 2015-08-29 ENCOUNTER — Ambulatory Visit (INDEPENDENT_AMBULATORY_CARE_PROVIDER_SITE_OTHER): Payer: BLUE CROSS/BLUE SHIELD | Admitting: Family Medicine

## 2015-08-29 ENCOUNTER — Encounter (INDEPENDENT_AMBULATORY_CARE_PROVIDER_SITE_OTHER): Payer: Self-pay | Admitting: Family Medicine

## 2015-08-29 VITALS — BP 142/94 | Ht 70.0 in | Wt 292.0 lb

## 2015-08-29 DIAGNOSIS — M47816 Spondylosis without myelopathy or radiculopathy, lumbar region: Secondary | ICD-10-CM

## 2015-08-29 DIAGNOSIS — M4696 Unspecified inflammatory spondylopathy, lumbar region: Secondary | ICD-10-CM

## 2015-08-29 NOTE — Progress Notes (Signed)
Chief Complaint   Patient presents with   . Follow Up Visit     Low back, s/p MRI done 08/23/15       SUBJECTIVE:  Lindsey [name] is a 64 year old female who follows up today for follow-up of her low back pain.  Since I saw her last she has continued to have pain with standing or walking.  She has difficulty walking more than 100 feet because of her pain.  She did undergo an MRI which she is here to review.  She denies any new changes such as bowel or bladder changes, weakness in her legs.  The pain is predominately her low back and does not radiate.    ROS: Other than what is listed in the HPI, all other systems are negative.    Review of patient's allergies indicates:  Allergies   Allergen Reactions   . Pneumovax [Pneumococcal Polysaccharide Vaccine] Fever, Nausea/Vomiting and Swelling   . Tramadol Other     Heart fluttering       Current Outpatient Prescriptions   Medication Sig Dispense Refill   . Allopurinol 100 MG Oral Tab Take 1 tablet (100 mg) by mouth daily. 90 tablet 3   . ALPRAZolam 0.5 MG Oral Tab Take 0.5-1 tablets (0.25-0.5 mg) by mouth 2 times a day as needed for anxiety. for anxiety 10 tablet 1   . Aspirin 81 MG Oral Tab 1 TABLET DAILY     . BuPROPion HCl, SR, 150 MG Oral TABLET SR 12 HR Take 1 tablet (150 mg) by mouth 2 times a day. 180 tablet 6   . Cholecalciferol (VITAMIN D3) 2000 UNITS Oral Tab Take 1 tablet Daily     . Diclofenac Sodium 75 MG Oral Tab EC Take 1 tablet (75 mg) by mouth 2 times a day as needed for pain. 60 tablet 1   . Enalapril Maleate 5 MG Oral Tab TAKE ONE TABLET BY MOUTH DAILY 90 tablet 3   . Estradiol 0.0375 MG/24HR Transdermal PATCH BIWEEKLY Place 1 patch on the skin 2 times a week. 8 patch 1   . Fluticasone Propionate 50 MCG/ACT Nasal Suspension Spray in each nostril daily     . Hydrocodone-Acetaminophen 5-325 MG Oral Tab Take 1 tablet by mouth every 6 hours as needed for pain. For SEVERE joint Pain. 30 tablet 0   . Multiple Vitamin Oral Tab Take 1 tablet Daily     . Omega-3  Fatty Acids (FISH OIL) 1000 MG Oral Cap Take 1 cap Daily     . Simvastatin 40 MG Oral Tab Take 0.5 tablets (20 mg) by mouth daily. 45 tablet 3   . TraMADol HCl 50 MG Oral Tab Take 1-2 tablets (50-100 mg) by mouth every 6 hours as needed for pain (moderate-severe pain). 40 tablet 2   . TraZODone HCl 50 MG Oral Tab Take 1-2 tablets (50-100 mg) by mouth at bedtime as needed for sleep. For sleep 180 tablet 2   . Triamterene-HCTZ 37.5-25 MG Oral Tab Take 1 tablet by mouth daily. 90 tablet 1   . Venlafaxine HCl ER 75 MG Oral CAPSULE SR 24 HR Take 1 capsule (75 mg) by mouth daily. 30 capsule 11   . Vitamin E 400 UNITS Oral Tab Take 1 tablet Daily       No current facility-administered medications for this visit.       Social History     Social History   . Marital Status: Married     Spouse Name: N/A   .  Number of Children: N/A   . Years of Education: N/A     Occupational History   . Not on file.     Social History Main Topics   . Smoking status: Former Smoker -- 1.00 packs/day for 40 years     Types: Cigarettes     Quit date: 05/05/2011   . Smokeless tobacco: Never Used   . Alcohol Use: 0.0 oz/week     0-3 Glasses of wine per week   . Drug Use: No   . Sexual Activity:     Partners: Male     Birth Control/ Protection: Surgical     Other Topics Concern   . Not on file     Social History Narrative    10/04/13: Married, 3 children.  Not working currently.       OBJECTIVE: BP 142/94 mmHg  Ht 5\' 10"  (1.778 m)  Wt 292 lb (132.45 kg)  BMI 41.90 kg/m2  General:  Well developed, well nourished in NAD.        RADIOLOGY:  Date of Service: 08/23/2015  EXAMINATION:   MRI of the lumbar spine without contrast     CLINICAL INFORMATION:   Worsening low back pain, with night pain. History of fracture with   hardware from T11 through L3. Now unable to stand upright. Pain   improved with flexion. Evaluate for worsening spinal stenosis. Lumbar   fusion performed in 2005.     COMPARISON STUDY:   Plain films of the lumbar spine from 12/12/2012      TECHNIQUE:   MRI of the lumbar spine was performed using sagittal T1, T2, STIR,   axial T1, T2, coronal T1.     FINDINGS:   Please note that portions of the exam are degraded by metallic   artifact, whereas much of the exam is nonetheless diagnostic.     Chronic L1 compression fracture is again seen, with mild retropulsion   not significantly changed. The alignment is within normal limits. No   fracture is otherwise seen. The bone marrow signal is within normal   limits where not degraded by metallic artifact. The conus terminates   at the L1-L2 level.     T12-L1: No significant canal or neuroforaminal narrowing. The facets   are grossly unremarkable given metallic artifact.     L1-L2: No significant canal or neuroforaminal narrowing. The facets   are obscured by metallic artifact.     L2-L3: No significant canal or neuroforaminal narrowing. The facets   are obscured by metallic artifact.     L3-L4: The canal at the L3 vertebral body level is obscured. However,   at the L3-L4 level there is no significant canal or neural foraminal   narrowing. The facets are obscured by metallic artifact.     L4-L5: No significant canal or neuroforaminal narrowing. Mild   bilateral facet hypertrophy is seen.     L5-S1: Broad-based disc bulge is seen with superimposed central disc   protrusion without significant canal or neural foraminal narrowing.   Mild bilateral facet hypertrophy is seen.     IMPRESSION:   1. Portions of the exam are degraded by metallic artifact. However,   given this caveat, no acute abnormality of the lumbar spine is   identified.   2. L5-S1 degenerative disc disease without evidence of nerve   impingement.   3. Mild lower lumbar facet arthropathy, with obscuration of the upper   to mid lumbar facets by metallic artifact.    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  Unfortunately there is significant metallic artifact that degrades this exam.  She does appear to have normal-appearing discs on the sagittal sequences with exception L5-S1.  It does appear that she has significant facet arthropathy which explains perhaps her pain when she stands upright.  I discussed her case with Dr. Harlin Heys today.  At this point we will proceed with diagnostic injections into her L4-L5 and L5-S1 joints bilaterally to see if we can isolate her pain.  She was referred to see Dr. Guido Sander for this and will follow-up with me accordingly.  Currently she feels that she is okay on her pain medications but she'll let me know if she needs more.  I will see her back after the injections, or she may follow up with Dr. Guido Sander after the injections based on the outcome.    F/U:  PRN    Ama was seen today for a total of 15 minutes.  Greater than 50% of this time was spent in face to face time, discussing her diagnosis, counseling and coordination of care.      Nelda Marseille. [place]  [place], [place] [address]  T: [phone]  F: [phone]

## 2015-08-30 ENCOUNTER — Other Ambulatory Visit (INDEPENDENT_AMBULATORY_CARE_PROVIDER_SITE_OTHER): Payer: Self-pay | Admitting: Orthopaedic Surgery

## 2015-08-30 DIAGNOSIS — M545 Low back pain, unspecified: Secondary | ICD-10-CM

## 2015-08-30 DIAGNOSIS — G8929 Other chronic pain: Secondary | ICD-10-CM | POA: Insufficient documentation

## 2015-08-30 DIAGNOSIS — M47816 Spondylosis without myelopathy or radiculopathy, lumbar region: Secondary | ICD-10-CM | POA: Insufficient documentation

## 2015-09-07 ENCOUNTER — Other Ambulatory Visit (INDEPENDENT_AMBULATORY_CARE_PROVIDER_SITE_OTHER): Payer: Self-pay | Admitting: Gynecology

## 2015-09-07 DIAGNOSIS — Z780 Asymptomatic menopausal state: Secondary | ICD-10-CM

## 2015-09-09 MED ORDER — ESTRADIOL 0.0375 MG/24HR TD PTTW
1.0000 | MEDICATED_PATCH | TRANSDERMAL | Status: DC
Start: 2015-09-09 — End: 2015-10-22

## 2015-09-09 NOTE — Telephone Encounter (Signed)
Per PR note pt to be seen every 2 years. Patient wellness/medication renewal made for Tuesday 10/22/15 @ 10:30 am.

## 2015-09-10 ENCOUNTER — Encounter (INDEPENDENT_AMBULATORY_CARE_PROVIDER_SITE_OTHER): Payer: Self-pay | Admitting: Family Medicine

## 2015-09-10 DIAGNOSIS — M47816 Spondylosis without myelopathy or radiculopathy, lumbar region: Secondary | ICD-10-CM

## 2015-09-10 DIAGNOSIS — M545 Low back pain, unspecified: Secondary | ICD-10-CM

## 2015-09-10 MED ORDER — HYDROCODONE-ACETAMINOPHEN 5-325 MG OR TABS
1.0000 | ORAL_TABLET | Freq: Four times a day (QID) | ORAL | Status: DC | PRN
Start: 2015-09-10 — End: 2015-10-22

## 2015-09-10 NOTE — Telephone Encounter (Signed)
Spoke with patient and she is going to pick up her rx at will call.

## 2015-09-10 NOTE — Telephone Encounter (Signed)
Hydrocodone-Acetaminophen 5-325 MG Oral Tab    Per patient request    Last RX: 08/16/15  Disp: #30  Refills: 0     ICD 10: Acute bilateral low back pain without sciatica - Primary M54.5  Spondylosis of lumbar region without myelopathy or radiculopathy M47.816     Future appointment with Dr.Oberg on 09/17/15 - Injection

## 2015-09-10 NOTE — Telephone Encounter (Signed)
Medication approved.  Please let Lindsey [name] know it may take a long time for the mail to get it to her.  She should pick up.

## 2015-09-17 ENCOUNTER — Ambulatory Visit
Admission: RE | Admit: 2015-09-17 | Discharge: 2015-09-17 | Disposition: A | Discharge status: Home / Self Care | Payer: BLUE CROSS/BLUE SHIELD | Attending: Orthopaedic Surgery | Admitting: Orthopaedic Surgery

## 2015-09-17 DIAGNOSIS — Z981 Arthrodesis status: Secondary | ICD-10-CM | POA: Insufficient documentation

## 2015-09-17 DIAGNOSIS — M47816 Spondylosis without myelopathy or radiculopathy, lumbar region: Secondary | ICD-10-CM | POA: Insufficient documentation

## 2015-09-17 DIAGNOSIS — M545 Low back pain: Secondary | ICD-10-CM

## 2015-09-17 DIAGNOSIS — M47817 Spondylosis without myelopathy or radiculopathy, lumbosacral region: Secondary | ICD-10-CM

## 2015-09-17 HISTORY — PX: PR NJX DX/THER AGT PVRT FACET JT LMBR/SAC 2ND LEVEL: 64494

## 2015-09-17 HISTORY — PX: PR NJX DX/THER AGT PVRT FACET JT LMBR/SAC 1 LEVEL: 64493

## 2015-09-20 ENCOUNTER — Encounter (INDEPENDENT_AMBULATORY_CARE_PROVIDER_SITE_OTHER): Payer: Self-pay

## 2015-09-23 ENCOUNTER — Ambulatory Visit (INDEPENDENT_AMBULATORY_CARE_PROVIDER_SITE_OTHER): Payer: BLUE CROSS/BLUE SHIELD | Admitting: Orthopaedic Surgery

## 2015-09-23 ENCOUNTER — Encounter (INDEPENDENT_AMBULATORY_CARE_PROVIDER_SITE_OTHER): Payer: Self-pay | Admitting: Orthopaedic Surgery

## 2015-09-23 VITALS — BP 145/80 | HR 88 | Ht 70.08 in | Wt 292.0 lb

## 2015-09-23 DIAGNOSIS — G8929 Other chronic pain: Secondary | ICD-10-CM

## 2015-09-23 DIAGNOSIS — Z9889 Other specified postprocedural states: Secondary | ICD-10-CM | POA: Insufficient documentation

## 2015-09-23 DIAGNOSIS — M47816 Spondylosis without myelopathy or radiculopathy, lumbar region: Secondary | ICD-10-CM

## 2015-09-23 DIAGNOSIS — M545 Low back pain, unspecified: Secondary | ICD-10-CM

## 2015-09-23 DIAGNOSIS — M5459 Other low back pain: Secondary | ICD-10-CM | POA: Insufficient documentation

## 2015-09-23 DIAGNOSIS — M5136 Other intervertebral disc degeneration, lumbar region: Secondary | ICD-10-CM

## 2015-09-23 NOTE — Progress Notes (Signed)
09/23/2015    Lindsey [name]  [id number]    IDENTIFYING DATA:  Lindsey [name] is a pleasant 64 year old female who was last seen on 09/17/2015 for bilateral L4-5 and L5-S1 facet joint medial branch blocks.  She returns today for reevaluation.    INTERIM HISTORY:  Lindsey [name] states that she had significant benefit immediately after the injections and her benefit has continued.  She is feels great and really has no pain at all rating current discomfort 0/10.  She will feel perhaps a hint of discomfort at times but nothing like it was and she is quite surprised and amazed.  She feels that she's been walking flex forward for so long that she finds it difficult to stand up tall because muscles are weak and tight.  Recently in the grocery store she was standing in line in which she got to the checkout she was shocked because she was not leaning on the cart as she would typically have to.  Wasn't bothering her more is her knees.  She denies any referral distally no new issues.    PMH/PSH/MEDICATIONS/FAMILY MEDICAL HISTORY/SOCIAL HISTORY:  See chart. Pertient changes described above.     MEDICATIONS/ALLERGIES:  See chart. No changes.     REVIEW OF SYSTEMS:  14 point review of systems negative other than mentioned above.    PHYSICAL EXAMINATION:  General: Awake, alert, and oriented, no apparent distress, pleasant, and cooperative  Vitals: Blood pressure 145/80, pulse 88, height 5' 10.08" (1.78 m), weight 292 lb (132.45 kg).Body mass index is 41.8 kg/(m^2).  Neurologic/Musculoskeletal: Gait non-antalgic.  On standing she still does have a slight forward flexed lumbar posture but she is able to compensate and stand upright including walking on hallway with only a slight flexed at all.  She has no pain with walking down the hallway in clinic today.  Minimal if any discomfort today with extension rotation and facet loading.  Neurological exam and other provocative testing was not repeated.    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 We discussed her response to the diagnostic medial branch blocks.  We are both very pleased that she is doing better.  2. At this time we agreed to have her start working with physical therapy for strengthening and stabilization.  If discomfort returns she will call us and we will repeat the diagnostic medial branch blocks.  If this again helps she would be a candidate for facet rhizotomy/ablation.  3. Regarding her knee pain due to arthritis she will follow up with Dr. Marylin [name].   4. Follow-up me in about 5-6 weeks.  Call with any questions or concerns      Greater than 15 minutes were spent with the patient in clinic today, with more than 50% of the time face-to-face in consultation, education, and coordination of care.        Lindsey [name] B.HGuido [name], D.O.   Board certified - Physical Medicine & Rehabilitation      Portions of this document were created with voice-recognition software and may contain occasional errant or misspelled words.

## 2015-09-23 NOTE — Patient Instructions (Signed)
Start working with physical therapy    If your pain returns call and we will schedule you for repeat injections.  Please be sure to mail [place] a copy of the pain log    Call with any questions

## 2015-10-22 ENCOUNTER — Ambulatory Visit (INDEPENDENT_AMBULATORY_CARE_PROVIDER_SITE_OTHER): Payer: BLUE CROSS/BLUE SHIELD | Admitting: Gynecology

## 2015-10-22 ENCOUNTER — Encounter (INDEPENDENT_AMBULATORY_CARE_PROVIDER_SITE_OTHER): Payer: Self-pay | Admitting: Family Medicine

## 2015-10-22 VITALS — BP 140/80 | HR 92 | Ht 70.08 in

## 2015-10-22 DIAGNOSIS — Z789 Other specified health status: Secondary | ICD-10-CM | POA: Insufficient documentation

## 2015-10-22 DIAGNOSIS — Z01419 Encounter for gynecological examination (general) (routine) without abnormal findings: Secondary | ICD-10-CM

## 2015-10-22 DIAGNOSIS — M545 Low back pain, unspecified: Secondary | ICD-10-CM

## 2015-10-22 DIAGNOSIS — N951 Menopausal and female climacteric states: Secondary | ICD-10-CM

## 2015-10-22 DIAGNOSIS — I2510 Atherosclerotic heart disease of native coronary artery without angina pectoris: Secondary | ICD-10-CM | POA: Insufficient documentation

## 2015-10-22 DIAGNOSIS — M47816 Spondylosis without myelopathy or radiculopathy, lumbar region: Secondary | ICD-10-CM

## 2015-10-22 MED ORDER — HYDROCODONE-ACETAMINOPHEN 5-325 MG OR TABS
1.0000 | ORAL_TABLET | Freq: Four times a day (QID) | ORAL | Status: DC | PRN
Start: 2015-10-22 — End: 2015-11-21

## 2015-10-22 NOTE — Telephone Encounter (Signed)
Called patient to let her know her Rx is ready for pick up.

## 2015-10-22 NOTE — Telephone Encounter (Signed)
Refill request per patient.  Norco 5/325    Last seen 08/29/15 for low back (also now seeing Dr. Guido Sander for low back)

## 2015-10-22 NOTE — Progress Notes (Signed)
Lindsey [name] is a 64 year old female here today for a preventive health visit.   Other problems or concerns today include the following: none    GYN HISTORY  Obstetric History    G2   P2   T0   P0   A0   TAB0   SAB0   E0   M0   L0      Menopause: non-surgical menopause at age 37  Any postmenopausal bleeding: No  Menopausal symptoms:     hot flushes: No     vaginal dryness: No     incontinence: No  Hormone replacement therapy: none currently, has taken in past- she stopped her patches about two months ago with no symptoms  Other gyn history: hyst  Screening for osteoporosis: YES    CANCER SCREENING  Pap history: no history of abnormal paps  Last pap: not indicated  Family history of colon cancer: NO  Family history of uterine or ovarian cancer: NO  Family history of breast cancer: NO  Last mammogram:  10/15  History of abnormal mammogram: NO    SEXUAL HISTORY  Sexual activity: not at present  Sexual concerns: No  History of STD: none known  History of sexual or physical abuse:  No  Have you been hit, kicked, punched, or otherwise hurt by someone within the past year:No    LIFESTYLE  Current dietary habits: not answered  Calcium: vitamin D supplements:  YES  Vitamins: multi-vitamin  Current exercise habits: PT- multiple joint issues affecting mobility  Regular seat belt use: YES  Substance use:  reports that she quit smoking about 4 years ago. Her smoking use included Cigarettes. She has a 40 pack-year smoking history. She has never used smokeless tobacco. She reports that she drinks alcohol. She reports that she does not use illicit drugs.    Activities of daily living (ADL): independent    History sections of chart reviewed and updated today: YES      Review Of Systems:  Constitutional: Denies, fatigue, unexpected weight loss, unexpected weight gain  Regular dental exams: yes  Eye: Denies, change in vision, .  Does have, scintillomas   ENT: Denies, nasal congestion and any ear nose or throat  problems  Respiratory: Denies, dyspnea on exertion, dyspnea at rest, cough, wheezing  Cardiovascular: Denies, chest pain or pressure at rest or during exercise, palpitations  GI: Denies, change in appetite, dysphagia, nausea, vomiting, dyspepsia, abdominal pain, diarrhea, constipation, hematochezia, melena  GU: Denies, prior abnormal pap smear, dysuria, hematuria, urinary incontinence, urinary urgency, vaginal discharge, abnormal vaginal bleeding, pelvic pain  Derm: Denies, skin rash, easy bruising  Rheumatologic/Musculoskeletal: .  Does have, arthralgias   Endocrine: Denies, polyuria, polydipsia, heat intolerance, cold intolerance, prior blood sugar problems, prior thyroid problems  Neurologic: Denies, headaches and tremors  Psych: .  Does report, dysthymia seasonal and sleep disturbance at times    PHYSICAL EXAM:  General: healthy, alert, no distress  Evident hearing difficulty?: NO  Skin: Skin color, texture, turgor normal. No rashes or concerning lesions  Head: Normocephalic. No masses, lesions, tenderness or abnormalities  Eyes: Lids/periorbital skin normal, Conjunctivae/corneas clear, EOM's intact  Neck: supple. No adenopathy. Thyroid symmetric, normal size, without nodules  Lungs: clear to auscultation  Heart: normal rate, regular rhythm and no murmurs, clicks, or gallops  Breasts: No obvious deformity or mass to inspection, nipples everted bilaterally, no skin lesion or nipple discharge, no mass palpated, no axillary lymphadenopathy  Abd: soft, non-tender. BS normal. No  masses or organomegaly  GU: normal bartholin/skene/urethral meatus/anus., Vagina is mildly atrophic and well supported, no lesions seen or palpated, no bladder tenderness  Rectal: deferred  Extremities: Normal, without deformities, edema, or skin discoloration  Neuro:  Grossly normal to observation, gait normal    ASSESSMENT AND PLAN:    Health Maintenance   Topic Date Due   . Lung Cancer Screen  06/11/2016   . Mammogram  11/29/2016   .  Diabetes Screen  02/18/2018   . Colonoscopy  12/05/2019   . Cholesterol Test  02/19/2020   . Tetanus Vaccine  05/05/2021   . Zoster Vaccine  Completed   . Influenza Vaccine  Completed   . Hepatitis C Screen  Addressed   . HIV Screen  Addressed     Immunizations due: per PCP  Screening tests discussed : Breast cancer and ASCCP cervical cancer screening guidelines reviewed.    (Z01.419) Encounter for gynecological examination without abnormal finding  (primary encounter diagnosis)  Plan: normal clinical exam    (N95.1) Menopausal symptoms  Plan: asymptomatic off HRT- it is appropriate to be off at this time/age.    Followup: 2 years  Lab tests and results of any diagnostic studies will be shared with the patient by  eCare

## 2015-11-07 ENCOUNTER — Encounter (INDEPENDENT_AMBULATORY_CARE_PROVIDER_SITE_OTHER): Payer: Self-pay | Admitting: Orthopaedic Surgery

## 2015-11-07 ENCOUNTER — Encounter (INDEPENDENT_AMBULATORY_CARE_PROVIDER_SITE_OTHER): Payer: Self-pay | Admitting: Family Medicine

## 2015-11-07 ENCOUNTER — Ambulatory Visit (INDEPENDENT_AMBULATORY_CARE_PROVIDER_SITE_OTHER): Payer: BLUE CROSS/BLUE SHIELD | Admitting: Orthopaedic Surgery

## 2015-11-07 VITALS — BP 152/88 | HR 92 | Ht 70.08 in | Wt 292.0 lb

## 2015-11-07 DIAGNOSIS — M545 Low back pain, unspecified: Secondary | ICD-10-CM

## 2015-11-07 DIAGNOSIS — Z9889 Other specified postprocedural states: Secondary | ICD-10-CM

## 2015-11-07 DIAGNOSIS — I2510 Atherosclerotic heart disease of native coronary artery without angina pectoris: Secondary | ICD-10-CM

## 2015-11-07 DIAGNOSIS — M47816 Spondylosis without myelopathy or radiculopathy, lumbar region: Secondary | ICD-10-CM

## 2015-11-07 DIAGNOSIS — M5136 Other intervertebral disc degeneration, lumbar region: Secondary | ICD-10-CM

## 2015-11-07 DIAGNOSIS — G8929 Other chronic pain: Secondary | ICD-10-CM

## 2015-11-07 DIAGNOSIS — M5459 Other low back pain: Secondary | ICD-10-CM

## 2015-11-07 NOTE — Patient Instructions (Signed)
We will send you to [facility]ology to have a repeat diagnostic injections done, and then you will schedule separately for consultation and possible rhizotomy (burning the sensory nerve to the joints)    Call with any questions or concerns

## 2015-11-07 NOTE — Progress Notes (Signed)
11/07/2015    Lindsey [name]  [id number]    IDENTIFYING DATA:  Lindsey [name] is a pleasant 64 year old female who was last seen on 09/23/2015.  She returns today for reevaluation.    INTERIM HISTORY:  Lindsey [name] states that she continue do relatively well after the diagnostic injections which was better than expected.  In the past 10 days to 2 weeks symptoms have returned and she rates her discomfort now at up to 3-7/10.  She does feel that therapy and getting stronger is helping her, but she continues to have axial discomfort especially with prolonged standing or walking.  There is no referral distally no new injuries no new neurological deficits.  She is here to discuss the next step in treatment.    PMH/PSH/MEDICATIONS/FAMILY MEDICAL HISTORY/SOCIAL HISTORY:  See chart. Pertient changes described above.     MEDICATIONS/ALLERGIES:  See chart. No changes.     REVIEW OF SYSTEMS:  14 point review of systems negative other than mentioned above.    PHYSICAL EXAMINATION:  General: Awake, alert, and oriented, no apparent distress, pleasant, and cooperative  Vitals: Blood pressure 152/88, pulse 92, height 5' 10.08" (1.78 m), weight 292 lb (132.45 kg).Body mass index is 41.8 kg/(m^2).  Neurologic/Musculoskeletal: Gait non-antalgic.  On standing she still does have a slight forward flexed lumbar posture but this is improved compared to last time.  She does have discomfort with extension and facet loading, some relief with slight forward flexion.  No neural tension signs.  Neurological exam is overall unchanged and unremarkable compared to last clinic visit.    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 We discussed her response to the diagnostic medial  branch blocks done 09/17/2015 and also the next step in treatment.  As her facet-based pain seems to be returning this would be appropriate to proceed with repeat diagnostic injections after which time she would be a conjugate for possible rhizotomy/radiofrequency ablation.  2. Due to timing and scheduling, and the fact that she has already reached deductible, she would like to get this done as soon as possible within the year.  We will therefore refer her to [facility] for repeat diagnostic medial branch blocks which can be done by them next week, and she will subsequently be scheduled for consultation and rhizotomy which will hopefully be done by the end of the year.  She was very happy with this plan.  Referrals were made today.  She understands the procedure and also the reason for what is done.  We expect that she will hopefully have relief of her normal axial symptoms on average from 8-18 months or longer.  3. Follow-up with me as needed.  Call with any questions      Greater than 15 minutes were spent with the patient in clinic today, with more than 50% of the time face-to-face in consultation, education, and coordination of care.        Cheri Ayotte B.HGuido Sander, D.O.   Board certified - Physical Medicine & Rehabilitation      Portions of this document were created with voice-recognition software and may contain occasional errant or misspelled words.

## 2015-11-08 DIAGNOSIS — I2510 Atherosclerotic heart disease of native coronary artery without angina pectoris: Secondary | ICD-10-CM | POA: Insufficient documentation

## 2015-11-12 ENCOUNTER — Other Ambulatory Visit (INDEPENDENT_AMBULATORY_CARE_PROVIDER_SITE_OTHER): Payer: Self-pay | Admitting: Family Medicine

## 2015-11-12 DIAGNOSIS — I10 Essential (primary) hypertension: Secondary | ICD-10-CM

## 2015-11-12 DIAGNOSIS — F419 Anxiety disorder, unspecified: Secondary | ICD-10-CM

## 2015-11-12 MED ORDER — ALPRAZOLAM 0.5 MG OR TABS
0.2500 mg | ORAL_TABLET | Freq: Two times a day (BID) | ORAL | Status: DC | PRN
Start: 2015-11-12 — End: 2016-01-22

## 2015-11-12 MED ORDER — TRIAMTERENE-HCTZ 37.5-25 MG OR TABS
1.0000 | ORAL_TABLET | Freq: Every day | ORAL | Status: DC
Start: 2015-11-12 — End: 2016-01-22

## 2015-11-12 NOTE — Telephone Encounter (Signed)
Refill per pharmacy request.  Alprazolam 0.5 mg  Triamterene-HCTZ 37.5-25 mg    Last seen 08/29/15 for low back  Last seen for anxiety/depression/HTN: 02/19/15 (CPE)

## 2015-11-12 NOTE — Telephone Encounter (Signed)
Rx alprazolam 0.5 mg faxed to [facility].

## 2015-11-21 ENCOUNTER — Telehealth (INDEPENDENT_AMBULATORY_CARE_PROVIDER_SITE_OTHER): Payer: Self-pay | Admitting: Family Medicine

## 2015-11-21 ENCOUNTER — Encounter (INDEPENDENT_AMBULATORY_CARE_PROVIDER_SITE_OTHER): Payer: Self-pay | Admitting: Family Medicine

## 2015-11-21 DIAGNOSIS — M47816 Spondylosis without myelopathy or radiculopathy, lumbar region: Secondary | ICD-10-CM

## 2015-11-21 DIAGNOSIS — M545 Low back pain, unspecified: Secondary | ICD-10-CM

## 2015-11-21 MED ORDER — HYDROCODONE-ACETAMINOPHEN 5-325 MG OR TABS
1.0000 | ORAL_TABLET | Freq: Four times a day (QID) | ORAL | Status: DC | PRN
Start: 2015-11-21 — End: 2015-12-27

## 2015-11-21 NOTE — Telephone Encounter (Signed)
Called patient, and spoke to her, to let know Prescription is ready for pick up and can pick it up during normal business hours.

## 2015-11-25 ENCOUNTER — Ambulatory Visit: Payer: BLUE CROSS/BLUE SHIELD | Attending: Gynecology

## 2015-11-25 DIAGNOSIS — Z1231 Encounter for screening mammogram for malignant neoplasm of breast: Secondary | ICD-10-CM | POA: Insufficient documentation

## 2015-11-26 ENCOUNTER — Ambulatory Visit (INDEPENDENT_AMBULATORY_CARE_PROVIDER_SITE_OTHER): Payer: BLUE CROSS/BLUE SHIELD

## 2015-11-26 ENCOUNTER — Encounter (INDEPENDENT_AMBULATORY_CARE_PROVIDER_SITE_OTHER): Payer: Self-pay | Admitting: Orthopaedic Surgery

## 2015-11-26 ENCOUNTER — Encounter: Payer: Self-pay | Admitting: Gynecology

## 2015-11-28 ENCOUNTER — Telehealth (INDEPENDENT_AMBULATORY_CARE_PROVIDER_SITE_OTHER): Payer: Self-pay | Admitting: Orthopaedic Surgery

## 2015-11-28 NOTE — Telephone Encounter (Signed)
Patient called to discuss the rhizotomy she needs to schedule and some issues related to that.     Please call today.

## 2015-11-28 NOTE — Telephone Encounter (Signed)
Spoke with the patient who states there is some confusion on the fact that she has had 2 sets of MBB and [facility] is having a hard time getting the RFA approved. I told her I would discuss this Dr Guido Sander and get back to her.

## 2015-12-02 ENCOUNTER — Telehealth (INDEPENDENT_AMBULATORY_CARE_PROVIDER_SITE_OTHER): Payer: Self-pay | Admitting: Orthopaedic Surgery

## 2015-12-02 ENCOUNTER — Encounter (INDEPENDENT_AMBULATORY_CARE_PROVIDER_SITE_OTHER): Payer: Self-pay | Admitting: Orthopaedic Surgery

## 2015-12-02 NOTE — Telephone Encounter (Signed)
Celsea at [facility] called regarding the Rhizotomy that is scheduled for tomorrow. The insurance still is pending authorization and a peer to peer is required. Please give Lindsey [name] or Loree Fee a call.

## 2015-12-02 NOTE — Telephone Encounter (Signed)
Pt called to ask [place] about calling her insurance for auth for her Rhizotomy. Is there a status update on this?

## 2015-12-03 NOTE — Telephone Encounter (Signed)
Spoke with Lindsey [name] and he talked to [place] yesterday to reschedule the rhizotomy.

## 2015-12-03 NOTE — Telephone Encounter (Signed)
Ed spoke with the insurance company and someone started a complementary review that lasts seven days, no peer to peer review can occur during those seven days. 12/06/15 Dr.Oberg can do a peer to peer review. Closing encounter.

## 2015-12-27 ENCOUNTER — Encounter (INDEPENDENT_AMBULATORY_CARE_PROVIDER_SITE_OTHER): Payer: Self-pay | Admitting: Family Medicine

## 2015-12-27 DIAGNOSIS — M545 Low back pain, unspecified: Secondary | ICD-10-CM

## 2015-12-27 DIAGNOSIS — M47816 Spondylosis without myelopathy or radiculopathy, lumbar region: Secondary | ICD-10-CM

## 2015-12-27 MED ORDER — HYDROCODONE-ACETAMINOPHEN 5-325 MG OR TABS
1.0000 | ORAL_TABLET | Freq: Four times a day (QID) | ORAL | Status: DC | PRN
Start: 2015-12-27 — End: 2016-01-25

## 2016-01-22 ENCOUNTER — Other Ambulatory Visit (INDEPENDENT_AMBULATORY_CARE_PROVIDER_SITE_OTHER): Payer: Self-pay | Admitting: Family Medicine

## 2016-01-22 DIAGNOSIS — F331 Major depressive disorder, recurrent, moderate: Secondary | ICD-10-CM

## 2016-01-22 DIAGNOSIS — F330 Major depressive disorder, recurrent, mild: Secondary | ICD-10-CM

## 2016-01-22 DIAGNOSIS — F419 Anxiety disorder, unspecified: Secondary | ICD-10-CM

## 2016-01-22 DIAGNOSIS — M1A00X0 Idiopathic chronic gout, unspecified site, without tophus (tophi): Secondary | ICD-10-CM

## 2016-01-22 DIAGNOSIS — I10 Essential (primary) hypertension: Secondary | ICD-10-CM

## 2016-01-22 MED ORDER — ALLOPURINOL 100 MG OR TABS
100.0000 mg | ORAL_TABLET | Freq: Every day | ORAL | Status: DC
Start: 2016-01-22 — End: 2017-01-04

## 2016-01-22 MED ORDER — BUPROPION HCL ER (SR) 150 MG OR TB12
150.0000 mg | EXTENDED_RELEASE_TABLET | Freq: Two times a day (BID) | ORAL | Status: DC
Start: 2016-01-22 — End: 2017-03-30

## 2016-01-22 MED ORDER — VENLAFAXINE HCL ER 75 MG OR CP24
75.0000 mg | EXTENDED_RELEASE_CAPSULE | Freq: Every day | ORAL | Status: DC
Start: 2016-01-22 — End: 2017-01-04

## 2016-01-22 MED ORDER — ALPRAZOLAM 0.5 MG OR TABS
0.2500 mg | ORAL_TABLET | Freq: Two times a day (BID) | ORAL | Status: DC | PRN
Start: 2016-01-22 — End: 2016-05-15

## 2016-01-22 MED ORDER — TRIAMTERENE-HCTZ 37.5-25 MG OR TABS
1.0000 | ORAL_TABLET | Freq: Every day | ORAL | Status: DC
Start: 2016-01-22 — End: 2016-05-15

## 2016-01-22 MED ORDER — ENALAPRIL MALEATE 5 MG OR TABS
5.0000 mg | ORAL_TABLET | Freq: Every day | ORAL | Status: DC
Start: 2016-01-22 — End: 2017-03-30

## 2016-01-22 NOTE — Telephone Encounter (Signed)
[facility] with the [facility] she needs refilled.     ORDERS PENDED: Allopurinol 100 MG Oral Tab  ALPRAZolam 0.5 MG Oral  BuPROPion HCl, SR, 150 MG Oral TABLET SR 12 HR  Enalapril Maleate 5 MG Oral Tab  Venlafaxine HCl ER 75 MG Oral CAPSULE SR 24 HR  Triamterene-HCTZ 37.5-25 MG Oral Tab    Last seen: 02/19/15 - Gout/Anxiety/Depression/HBP

## 2016-01-25 ENCOUNTER — Encounter (INDEPENDENT_AMBULATORY_CARE_PROVIDER_SITE_OTHER): Payer: Self-pay | Admitting: Family Medicine

## 2016-01-25 DIAGNOSIS — M170 Bilateral primary osteoarthritis of knee: Secondary | ICD-10-CM

## 2016-01-25 DIAGNOSIS — M47816 Spondylosis without myelopathy or radiculopathy, lumbar region: Secondary | ICD-10-CM

## 2016-01-25 DIAGNOSIS — M545 Low back pain, unspecified: Secondary | ICD-10-CM

## 2016-01-25 MED ORDER — HYDROCODONE-ACETAMINOPHEN 5-325 MG OR TABS
1.0000 | ORAL_TABLET | Freq: Four times a day (QID) | ORAL | Status: DC | PRN
Start: 2016-01-25 — End: 2016-02-27

## 2016-01-25 NOTE — Telephone Encounter (Signed)
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 Guido Sander for low back/injections and possible rhizotomy.      Last Norco Rx: 12/27/15  Last disabled parking: 08/16/15 (6 months)

## 2016-02-04 ENCOUNTER — Telehealth (INDEPENDENT_AMBULATORY_CARE_PROVIDER_SITE_OTHER): Payer: Self-pay | Admitting: Family Medicine

## 2016-02-04 NOTE — Telephone Encounter (Signed)
Patient called in regards to a chest cold she has had for 1 day. Pt states that she has a lot of mucus and congestion. She would like a call back to discuss what to do with Dr. Marylin Crosby being gone this week. Pt was offered to see doctors in the [place] office but she would like to speak with a nurse first. Please advise and call patient to discuss.

## 2016-02-04 NOTE — Telephone Encounter (Signed)
Called and left message for patient; encouraged her to try some conservative care/OTC medications such as mucinex, ibu/tylenol, or sudafed.  Also recommended hot showers.  If she feels she doesn't improve she should be seen by a covering physician while Dr. Marylin Crosby is gone.

## 2016-02-07 ENCOUNTER — Other Ambulatory Visit (INDEPENDENT_AMBULATORY_CARE_PROVIDER_SITE_OTHER): Payer: Self-pay | Admitting: Family Medicine

## 2016-02-07 DIAGNOSIS — Z760 Encounter for issue of repeat prescription: Secondary | ICD-10-CM

## 2016-02-07 MED ORDER — SIMVASTATIN 40 MG OR TABS
20.0000 mg | ORAL_TABLET | Freq: Every day | ORAL | Status: DC
Start: 2016-02-07 — End: 2016-10-02

## 2016-02-07 NOTE — Telephone Encounter (Signed)
Refill per pharmacy request.  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  eCare reminder sent to patient that she is due for CPE in March.

## 2016-02-26 ENCOUNTER — Encounter (INDEPENDENT_AMBULATORY_CARE_PROVIDER_SITE_OTHER): Payer: Self-pay | Admitting: Family Medicine

## 2016-02-26 DIAGNOSIS — M545 Low back pain, unspecified: Secondary | ICD-10-CM

## 2016-02-26 DIAGNOSIS — M47816 Spondylosis without myelopathy or radiculopathy, lumbar region: Secondary | ICD-10-CM

## 2016-02-27 MED ORDER — HYDROCODONE-ACETAMINOPHEN 5-325 MG OR TABS
1.0000 | ORAL_TABLET | Freq: Four times a day (QID) | ORAL | Status: DC | PRN
Start: 2016-02-27 — End: 2016-03-26

## 2016-02-27 NOTE — Telephone Encounter (Signed)
Orders Pended: Hydrocodone  Last seen: 08/16/15 w/ Rothmier  Last filled: 02/04/2016  Dx: Acute bilateral low back pain without sciatica

## 2016-03-03 NOTE — Telephone Encounter (Signed)
Called patient to verify that she was able to get her prescription of Norco as Dr. Marylin Crosby was out of the office last week.  She states that a covering nurse did call and she was able to pick it up.

## 2016-03-26 ENCOUNTER — Encounter (INDEPENDENT_AMBULATORY_CARE_PROVIDER_SITE_OTHER): Payer: Self-pay | Admitting: Family Medicine

## 2016-03-26 DIAGNOSIS — M47816 Spondylosis without myelopathy or radiculopathy, lumbar region: Secondary | ICD-10-CM

## 2016-03-26 DIAGNOSIS — M545 Low back pain, unspecified: Secondary | ICD-10-CM

## 2016-03-26 NOTE — Telephone Encounter (Signed)
ORDERS PENDED: Hydrocodone-Acetaminophen 5-325 MG Oral Tab    Per patient  Last RX: 02/27/16 Disp: #30 Refill:0  Last seen: 11/07/15 - Oberg   08/29/15 - back pain w/ Rothmier    DX: Acute bilateral low back pain without sciatica - Primary     Future appt on 05/15/16 CPE

## 2016-03-27 MED ORDER — HYDROCODONE-ACETAMINOPHEN 5-325 MG OR TABS
1.0000 | ORAL_TABLET | Freq: Four times a day (QID) | ORAL | Status: DC | PRN
Start: 2016-03-27 — End: 2016-04-24

## 2016-03-27 NOTE — Telephone Encounter (Signed)
Rx Norco 5/325 placed in will call and patient notified via [place].

## 2016-04-24 ENCOUNTER — Encounter (INDEPENDENT_AMBULATORY_CARE_PROVIDER_SITE_OTHER): Payer: Self-pay | Admitting: Family Medicine

## 2016-04-24 DIAGNOSIS — M545 Low back pain, unspecified: Secondary | ICD-10-CM

## 2016-04-24 DIAGNOSIS — M47816 Spondylosis without myelopathy or radiculopathy, lumbar region: Secondary | ICD-10-CM

## 2016-04-24 MED ORDER — HYDROCODONE-ACETAMINOPHEN 5-325 MG OR TABS
1.0000 | ORAL_TABLET | Freq: Four times a day (QID) | ORAL | Status: DC | PRN
Start: 2016-04-24 — End: 2016-05-15

## 2016-04-24 NOTE — Telephone Encounter (Signed)
Refill per eCare request:  Norco 5/325    Last seen 08/29/15 for low back.    Last seen by Dr. Guido Sander: 11/07/15    Last Rx: 03/27/16

## 2016-04-24 NOTE — Telephone Encounter (Signed)
Rx placed in will call and patient notified via ecare.

## 2016-04-24 NOTE — Telephone Encounter (Signed)
Patient called in and stated that her husband will be picking up her prescription, his name is Darrell. Please advise.

## 2016-05-15 ENCOUNTER — Ambulatory Visit: Payer: 59 | Attending: Family Medicine

## 2016-05-15 ENCOUNTER — Ambulatory Visit (INDEPENDENT_AMBULATORY_CARE_PROVIDER_SITE_OTHER): Payer: 59 | Admitting: Family Medicine

## 2016-05-15 ENCOUNTER — Encounter (INDEPENDENT_AMBULATORY_CARE_PROVIDER_SITE_OTHER): Payer: Self-pay | Admitting: Family Medicine

## 2016-05-15 VITALS — BP 140/88 | HR 85 | Temp 98.2°F | Ht 70.0 in | Wt 290.0 lb

## 2016-05-15 DIAGNOSIS — Z131 Encounter for screening for diabetes mellitus: Secondary | ICD-10-CM | POA: Insufficient documentation

## 2016-05-15 DIAGNOSIS — E782 Mixed hyperlipidemia: Secondary | ICD-10-CM

## 2016-05-15 DIAGNOSIS — M545 Low back pain, unspecified: Secondary | ICD-10-CM

## 2016-05-15 DIAGNOSIS — M1A00X0 Idiopathic chronic gout, unspecified site, without tophus (tophi): Secondary | ICD-10-CM | POA: Insufficient documentation

## 2016-05-15 DIAGNOSIS — I10 Essential (primary) hypertension: Secondary | ICD-10-CM | POA: Insufficient documentation

## 2016-05-15 DIAGNOSIS — F419 Anxiety disorder, unspecified: Secondary | ICD-10-CM

## 2016-05-15 DIAGNOSIS — F330 Major depressive disorder, recurrent, mild: Secondary | ICD-10-CM

## 2016-05-15 DIAGNOSIS — M47816 Spondylosis without myelopathy or radiculopathy, lumbar region: Secondary | ICD-10-CM

## 2016-05-15 DIAGNOSIS — Z1329 Encounter for screening for other suspected endocrine disorder: Secondary | ICD-10-CM

## 2016-05-15 DIAGNOSIS — Z130 Encounter for screening for diseases of the blood and blood-forming organs and certain disorders involving the immune mechanism: Secondary | ICD-10-CM | POA: Insufficient documentation

## 2016-05-15 DIAGNOSIS — Z0000 Encounter for general adult medical examination without abnormal findings: Secondary | ICD-10-CM

## 2016-05-15 DIAGNOSIS — G4733 Obstructive sleep apnea (adult) (pediatric): Secondary | ICD-10-CM

## 2016-05-15 LAB — CBC, DIFF
% Basophils: 1 %
% Eosinophils: 2 %
% Immature Granulocytes: 0 %
% Lymphocytes: 30 %
% Monocytes: 9 %
% Neutrophils: 58 %
% Nucleated RBC: 0 %
Absolute Eosinophil Count: 0.09 10*3/uL (ref 0.00–0.50)
Absolute Lymphocyte Count: 1.41 10*3/uL (ref 1.00–4.80)
Basophils: 0.05 10*3/uL (ref 0.00–0.20)
Hematocrit: 40 % (ref 36–45)
Hemoglobin: 12.8 g/dL (ref 11.5–15.5)
Immature Granulocytes: 0.01 10*3/uL (ref 0.00–0.05)
MCH: 30 pg (ref 27.3–33.6)
MCHC: 32.1 g/dL — ABNORMAL LOW (ref 32.2–36.5)
MCV: 93 fL (ref 81–98)
Monocytes: 0.41 10*3/uL (ref 0.00–0.80)
Neutrophils: 2.77 10*3/uL (ref 1.80–7.00)
Nucleated RBC: 0 10*3/uL
Platelet Count: 340 10*3/uL (ref 150–400)
RBC: 4.27 10*6/uL (ref 3.80–5.00)
RDW-CV: 13.7 % (ref 11.6–14.4)
WBC: 4.74 10*3/uL (ref 4.3–10.0)

## 2016-05-15 LAB — COMPREHENSIVE METABOLIC PANEL
ALT (GPT): 20 U/L (ref 7–33)
AST (GOT): 18 U/L (ref 9–38)
Albumin: 4.1 g/dL (ref 3.5–5.2)
Alkaline Phosphatase (Total): 87 U/L (ref 31–132)
Anion Gap: 11 (ref 4–12)
Bilirubin (Total): 0.5 mg/dL (ref 0.2–1.3)
Calcium: 9.7 mg/dL (ref 8.9–10.2)
Carbon Dioxide, Total: 28 meq/L (ref 22–32)
Chloride: 103 meq/L (ref 98–108)
Creatinine: 1.04 mg/dL — ABNORMAL HIGH (ref 0.38–1.02)
GFR, Calc, African American: >60 mL/min/{1.73_m2}
GFR, Calc, European American: 53 mL/min/{1.73_m2}
Glucose: 103 mg/dL (ref 62–125)
Potassium: 4.5 meq/L (ref 3.6–5.2)
Protein (Total): 7.1 g/dL (ref 6.0–8.2)
Sodium: 142 meq/L (ref 135–145)
Urea Nitrogen: 17 mg/dL (ref 8–21)

## 2016-05-15 LAB — LIPID PANEL
Cholesterol (LDL): 113 mg/dL (ref <130)
Cholesterol/HDL Ratio: 3.7
HDL Cholesterol: 55 mg/dL (ref >39)
Non-HDL Cholesterol: 146 mg/dL (ref 0–159)
Total Cholesterol: 201 mg/dL — ABNORMAL HIGH (ref <200)
Triglyceride: 167 mg/dL — ABNORMAL HIGH (ref <150)

## 2016-05-15 LAB — THYROID STIMULATING HORMONE: Thyroid Stimulating Hormone: 1.367 u[IU]/mL (ref 0.400–5.000)

## 2016-05-15 LAB — BILIRUBIN (DIRECT): Bilirubin (Direct): 0.1 mg/dL (ref 0.0–0.3)

## 2016-05-15 LAB — T4, FREE: Thyroxine (Free): 0.7 ng/dL (ref 0.6–1.2)

## 2016-05-15 LAB — URIC ACID, SERUM: Uric Acid: 6.8 mg/dL (ref 2.6–6.8)

## 2016-05-15 MED ORDER — ALPRAZOLAM 0.5 MG OR TABS
0.2500 mg | ORAL_TABLET | Freq: Two times a day (BID) | ORAL | 3 refills | Status: DC | PRN
Start: 2016-05-15 — End: 2017-01-27

## 2016-05-15 MED ORDER — HYDROCODONE-ACETAMINOPHEN 5-325 MG OR TABS
1.0000 | ORAL_TABLET | Freq: Four times a day (QID) | ORAL | 0 refills | Status: DC | PRN
Start: 2016-05-15 — End: 2016-06-27

## 2016-05-15 NOTE — Progress Notes (Signed)
Lindsey [name] is a 65 year old female here today for a preventive health visit. Other problems or concerns today:     1.  Morbid obesity:  She is struggling with her weight.  She cannot exercise due to her back pain, knee pain.       2.  Depression.  She feels she is doing well on her medications.  She is still taking bupropion and venlafaxine.   Occasionally she is needing alprazolam.      Over the last 2 weeks how often have you been bothered by any of the following problems?  1. Little interest or pleasure in doing things: Not at all  2. Feeling down, depressed or hopeless: Several days  3. Trouble falling or staying asleep, or sleeping too much: (!) Nearly every day  4. Feeling tired or having little energy : (!) Nearly every day  5. Poor appetite or overeating: (!) Nearly every day  6. Feeling bad about yourself - or that you are a failure or have let yourself or your family down: Several days  7. Trouble concentrating on things, such as reading the newspaper or watching television: Not at all  8. Moving or speaking much more slowly than usual.  Or the opposite - fidgety or restless: Not at all  9. Thoughts that you would be better off dead or of hurting yourself in some way: Not at all    Total  PHQ9 Total Score: 11    10. If you checked off any problems, how difficult have these problems made it for you to do your work, take care of things at home, or get along with other people?: Very difficult      3.  Obesity:  She is frustrated that she continues to stay obese. She cannot exercise as a result of her back and knees pain.    GYN: Dr. Debbrah Alar    CANCER SCREENING  Family history of colon cancer: NO  Family history of uterine or ovarian cancer: NO  Family history of breast cancer: NO  Prior mammogram: YES 2016  History of abnormal mammogram: NO    LIFESTYLE  Current dietary habits: meals per day on average: 3 plus snacks  Calcium: calcium supplements:  YES  Current exercise habits: gardening and  housecleaning  Regular seat belt use: YES  Substance use:  reports that she quit smoking about 5 years ago. Her smoking use included Cigarettes. She has a 40.00 pack-year smoking history. She has never used smokeless tobacco. She reports that she drinks alcohol. She reports that she does not use drugs.   Exposure to hazardous materials: No  Guns in the house: Yes: Stored in cases    History sections of chart reviewed and updated today: YES    Review Of Systems:  Constitutional: Denies, fatigue, fever, chills, night sweats  Regular dental exams: yes  Eye: Denies, blurry vision, diplopia, eye pain  ENT: Denies, hearing difficulties, nasal congestion and any ear nose or throat problems  Respiratory: Denies, dyspnea on exertion, dyspnea at rest, cough, wheezing  Cardiovascular: Denies, chest pain or pressure at rest or during exercise, palpitations,   GI: Denies, change in appetite, dysphagia, nausea, vomiting, dyspepsia, abdominal pain, diarrhea, constipation, hematochezia, melena  GU: Denies, dysuria, hematuria, urinary incontinence, urinary urgency, vaginal discharge, abnormal vaginal bleeding, pelvic pain  Derm: Denies, skin rash, easy bruising, hair loss, changes in moles or new moles  Rheumatologic/Musculoskeletal: Does have joint and arthritic problems, arthralgias (back and knee), joint stiffness (  knee and back)  Endocrine: Denies, polyuria, polydipsia, heat intolerance, cold intolerance, prior blood sugar problems, prior thyroid problems  Neurologic: Denies, headaches and tremors  Psych: Denies, depressed mood, and anxiety       EXAM:  General: healthy, alert, no distress, cooperative  Skin: Skin color, texture, turgor normal. No rashes or concerning lesions  Head: Normocephalic. No masses, lesions, tenderness or abnormalities  Eyes: Lids/periorbital skin normal, Conjunctivae/corneas clear, PERRL, EOM's intact  Neck: supple. No adenopathy. Thyroid symmetric, normal size, without nodules  Lungs: clear to  auscultation  Heart: normal rate, regular rhythm and no murmurs, clicks, or gallops  Breasts: not done: discussed recommendations for age, patient does BSE, patient declined  Abd: soft, non-tender. BS normal. No masses or organomegaly  GU: deferred  Rectal: deferred  Extremities: Normal, without deformities, edema, or skin discoloration, radial and DP pulses 2+ bilaterally  Neuro:  Grossly normal to observation, gait normal      ASSESSMENT:    65 yo female with:  Patient Active Problem List   Diagnosis   . Palpitations   . Hypertension   . Hyperlipidemia   . Gout   . Former smoker   . Sleep apnea   . Shortness of breath   . Basal cell carcinoma   . Squamous cell carcinoma ([place])   . Depression, major ([place])   . Lumbar degenerative disc disease   . Fracture, radius, distal   . Anxiety   . Cellulitis of face   . Morbid obesity ([place])   . Osteoarthritis of both knees   . Spondylosis of lumbar region without myelopathy or radiculopathy   . Chronic bilateral low back pain without sciatica   . Lumbar facet joint pain   . History of back surgery   . Other specified health status   . Coronary artery calcification seen on CT scan         PLAN:    HCM:  Check labs today including CBC, CMP, TSH, free T4, uric acid, lipids, hemoglobin A1c.  She is up-to-date on her colonoscopy, mammogram, and immunizations.      Hypertension: Stable on triamterene/HCTZ and enalapril.  Continue on these medications.  She needs to increase he activity if possible.    Hyperlipidemia: Recheck lipids today.  She will continue her Simvastatin.    Gout: Check uric acid.  Continue allopurinol.    Major Depression/anxiety: She will continue on the venlafaxine, bupropion and occasional alprazolam.    Sleep apnea:  She is on CPAP.  She will continue this now.    Morbid Obesity: She will start weight watchers.      Elevated serum creatinine: This is possibly related to NSAID use.  She will minimize this.    Spondylosis lumbar spine/osteoarthritis bilateral  knees:  Unfortunately, she has not responded to the injections as one would hope for her back.  She still quite limited.  She is continuing to take hydrocodone/acetaminophen.  As she cannot take the NSAIDs due to some concern of renal insults, she is requiring more of the opioids.  This was re-filled today #60.  We'll see how this last.  We may need to up the dose further.      F/U 1 year.    Health Maintenance   Topic Date Due   . Lung Cancer Screen  06/11/2016   . Mammogram  11/24/2017   . Diabetes Screen  02/18/2018   . Colonoscopy  12/05/2019   . Cholesterol Test  02/19/2020   .  Tetanus Vaccine  05/05/2021   . Zoster Vaccine  Completed   . Influenza Vaccine  Completed   . Hepatitis C Screen  Addressed   . HIV Screen  Addressed     Immunizations due: None  Screening tests discussed: Diabetes (FBS if BP > 135/80) and Lipids  History sections of chart reviewed and updated today: YES    No diagnosis found.    The following were reviewed with patient:  Skin cancer prevention and self-exam and Vitamin D  Patient information given for the following: none    Follow up: 1 year  Lab tests and results of any diagnostic studies will be shared with the patient by  Gilford Silvius was seen today for a total of 15 minutes in addition to her preventative health exam.  Greater than 50% of this time was spent in face to face time, discussing her diagnosis, counseling and coordination of care.

## 2016-05-15 NOTE — Patient Instructions (Signed)
Start weight watchers

## 2016-05-15 NOTE — Progress Notes (Signed)
Labs drawn per order: Fasting lipids, CMP, dir bili, CBC+diff, A1c, TSH, FT4, uric acid; right antecub, one attempt, no complications.

## 2016-05-19 ENCOUNTER — Encounter (INDEPENDENT_AMBULATORY_CARE_PROVIDER_SITE_OTHER): Payer: Self-pay | Admitting: Family Medicine

## 2016-05-19 LAB — HEMOGLOBIN A1C, HPLC: Hemoglobin A1C: 6.1 % — ABNORMAL HIGH (ref 4.0–6.0)

## 2016-06-26 ENCOUNTER — Encounter (INDEPENDENT_AMBULATORY_CARE_PROVIDER_SITE_OTHER): Payer: Self-pay | Admitting: Family Medicine

## 2016-06-27 ENCOUNTER — Other Ambulatory Visit (INDEPENDENT_AMBULATORY_CARE_PROVIDER_SITE_OTHER): Payer: Self-pay | Admitting: Family Medicine

## 2016-06-27 DIAGNOSIS — M545 Low back pain, unspecified: Secondary | ICD-10-CM

## 2016-06-27 DIAGNOSIS — M47816 Spondylosis without myelopathy or radiculopathy, lumbar region: Secondary | ICD-10-CM

## 2016-06-29 ENCOUNTER — Telehealth (INDEPENDENT_AMBULATORY_CARE_PROVIDER_SITE_OTHER): Payer: Self-pay | Admitting: Family Medicine

## 2016-06-29 MED ORDER — HYDROCODONE-ACETAMINOPHEN 5-325 MG OR TABS
1.0000 | ORAL_TABLET | Freq: Four times a day (QID) | ORAL | 0 refills | Status: DC | PRN
Start: 2016-06-29 — End: 2016-08-18

## 2016-06-29 NOTE — Telephone Encounter (Signed)
From: Lindsey [name]  Sent: 06/27/2016 12:35 PM PDT  Subject: Medication Renewal Request    Lindsey [name] would like a refill of the following medications:  Hydrocodone-Acetaminophen 5-325 MG Oral Tab Lindsey Marseille Rothmier, MD]    Preferred pharmacy: [facility] [address] [address] [facility] [phone]     Comment:  I am leaving town on Tuesday early AM so would need to pick up on Monday. Thank you!

## 2016-06-29 NOTE — Telephone Encounter (Signed)
Medication Requested: Norco 5/325, by patient eCare.  Last seen:  05/15/16 for CPE and spondylosis lumbar spine/OA bilateral knees.  Last seen for diagnosis pertinent to refill: 05/15/16  Last labs (as applicable): n/a  Last fill (as applicable): 0000000.  PMP checked and verified.  Follow up due: 1 year for CPE   Does patient need to be contacted?  NO

## 2016-06-29 NOTE — Telephone Encounter (Signed)
Rx placed in [place] will call.  Called and notified patient.

## 2016-06-29 NOTE — Telephone Encounter (Signed)
Pt called about Hydrocodone refill that she requested via ecare. She would like to pick up the Rx today and she is going out of town. Can we expedite her refill for this reason?

## 2016-06-29 NOTE — Telephone Encounter (Signed)
Medication refill addressed in separate eCare refill request encounter.  Closing this encounter.

## 2016-06-29 NOTE — Telephone Encounter (Signed)
Rx filled in separate eCare refill encounter.  Called patient to notify her that it is ready at [facility] office will call.

## 2016-07-14 ENCOUNTER — Encounter (INDEPENDENT_AMBULATORY_CARE_PROVIDER_SITE_OTHER): Payer: Self-pay | Admitting: Family Medicine

## 2016-07-14 DIAGNOSIS — M2550 Pain in unspecified joint: Secondary | ICD-10-CM

## 2016-07-14 DIAGNOSIS — R748 Abnormal levels of other serum enzymes: Secondary | ICD-10-CM

## 2016-07-15 MED ORDER — OXYCODONE HCL 5 MG OR TABS
5.0000 mg | ORAL_TABLET | Freq: Four times a day (QID) | ORAL | 0 refills | Status: DC | PRN
Start: 2016-07-15 — End: 2017-03-30

## 2016-07-15 NOTE — Telephone Encounter (Signed)
Called and spoke with patient re: her eCare message.  Yesterday she went to the [facility] in clinic as she had a few days of feeling ill with severe joint aches and fevers.  Tmax 102.5.  She states it came on suddenly.  She felt very weak and decided to go somewhere closer to home.  They drew labs and noted high LFT's.  Hepatitis labs negative.  She was instructed to not take any ibu or apap.  Records received from walk in clinic.      Patient states she is feeling a little better today, temp down to 99.2,  Still feels weak.  Joint pain is better too but she does get some headaches when laying down.    She does not tolerate tramadol (gives her palpitations) and she isn't taking Norco due to the apap in it.      Per Dr. Marylin Crosby, patient can try some oxycodone for pain.  She should get repeat labs done Friday morning at [facility] lab and then see [place] on Friday at 11am.  Patient agreed to this and will have her husband come to pick up the oxycodone Rx.  She will call if anything changes prior to Friday's appt.

## 2016-07-15 NOTE — Telephone Encounter (Signed)
Pt picked up Oxycodone Rx from will call.

## 2016-07-16 ENCOUNTER — Encounter (INDEPENDENT_AMBULATORY_CARE_PROVIDER_SITE_OTHER): Payer: Self-pay | Admitting: Family Medicine

## 2016-07-17 ENCOUNTER — Telehealth (INDEPENDENT_AMBULATORY_CARE_PROVIDER_SITE_OTHER): Payer: Self-pay | Admitting: Family Medicine

## 2016-07-17 ENCOUNTER — Ambulatory Visit: Payer: 59 | Attending: Family Medicine

## 2016-07-17 ENCOUNTER — Encounter (INDEPENDENT_AMBULATORY_CARE_PROVIDER_SITE_OTHER): Payer: Self-pay

## 2016-07-17 ENCOUNTER — Encounter (INDEPENDENT_AMBULATORY_CARE_PROVIDER_SITE_OTHER): Payer: Self-pay | Admitting: Family Medicine

## 2016-07-17 ENCOUNTER — Ambulatory Visit (INDEPENDENT_AMBULATORY_CARE_PROVIDER_SITE_OTHER): Payer: 59 | Admitting: Family Medicine

## 2016-07-17 VITALS — BP 122/88 | HR 90 | Temp 98.2°F | Ht 70.0 in | Wt 280.0 lb

## 2016-07-17 DIAGNOSIS — R1084 Generalized abdominal pain: Secondary | ICD-10-CM

## 2016-07-17 DIAGNOSIS — R748 Abnormal levels of other serum enzymes: Secondary | ICD-10-CM | POA: Insufficient documentation

## 2016-07-17 DIAGNOSIS — Z9889 Other specified postprocedural states: Secondary | ICD-10-CM | POA: Insufficient documentation

## 2016-07-17 DIAGNOSIS — Z8719 Personal history of other diseases of the digestive system: Secondary | ICD-10-CM

## 2016-07-17 DIAGNOSIS — Z6841 Body Mass Index (BMI) 40.0 and over, adult: Secondary | ICD-10-CM

## 2016-07-17 LAB — COMPREHENSIVE METABOLIC PANEL
ALT (GPT): 127 U/L — ABNORMAL HIGH (ref 7–33)
AST (GOT): 29 U/L (ref 9–38)
Albumin: 3.7 g/dL (ref 3.5–5.2)
Alkaline Phosphatase (Total): 181 U/L — ABNORMAL HIGH (ref 38–172)
Anion Gap: 11 (ref 4–12)
Bilirubin (Total): 0.4 mg/dL (ref 0.2–1.3)
Calcium: 9.2 mg/dL (ref 8.9–10.2)
Carbon Dioxide, Total: 26 meq/L (ref 22–32)
Chloride: 103 meq/L (ref 98–108)
Creatinine: 1.13 mg/dL — ABNORMAL HIGH (ref 0.38–1.02)
GFR, Calc, African American: 59 mL/min/{1.73_m2} — ABNORMAL LOW (ref >59)
GFR, Calc, European American: 48 mL/min/{1.73_m2} — ABNORMAL LOW (ref >59)
Glucose: 134 mg/dL — ABNORMAL HIGH (ref 62–125)
Potassium: 4.2 meq/L (ref 3.6–5.2)
Protein (Total): 6.7 g/dL (ref 6.0–8.2)
Sodium: 140 meq/L (ref 135–145)
Urea Nitrogen: 20 mg/dL (ref 8–21)

## 2016-07-17 LAB — CBC, DIFF
% Basophils: 1 %
% Eosinophils: 3 %
% Immature Granulocytes: 0 %
% Lymphocytes: 33 %
% Monocytes: 9 %
% Neutrophils: 54 %
% Nucleated RBC: 0 %
Absolute Eosinophil Count: 0.14 10*3/uL (ref 0.00–0.50)
Absolute Lymphocyte Count: 1.54 10*3/uL (ref 1.00–4.80)
Basophils: 0.03 10*3/uL (ref 0.00–0.20)
Hematocrit: 40 % (ref 36–45)
Hemoglobin: 12.6 g/dL (ref 11.5–15.5)
Immature Granulocytes: 0.01 10*3/uL (ref 0.00–0.05)
MCH: 29.4 pg (ref 27.3–33.6)
MCHC: 31.9 g/dL — ABNORMAL LOW (ref 32.2–36.5)
MCV: 92 fL (ref 81–98)
Monocytes: 0.41 10*3/uL (ref 0.00–0.80)
Neutrophils: 2.48 10*3/uL (ref 1.80–7.00)
Nucleated RBC: 0 10*3/uL
Platelet Count: 266 10*3/uL (ref 150–400)
RBC: 4.28 10*6/uL (ref 3.80–5.00)
RDW-CV: 13.5 % (ref 11.6–14.4)
WBC: 4.61 10*3/uL (ref 4.3–10.0)

## 2016-07-17 LAB — BILIRUBIN (DIRECT): Bilirubin (Direct): 0.1 mg/dL (ref 0.0–0.3)

## 2016-07-17 NOTE — Progress Notes (Signed)
Chief Complaint   Patient presents with   . General Illness     07/13/16 in the afternoon, developed body aches and felt feverish (102.5 at night).  Next day 07/14/16 went to [facility] and had labs done.  Noted to have high liver function tests and WBC.  Headaches with laying down.  Was advised to not take ibu or tylenol.  Took oxycodone which helped with headaches.  still feeling weak and clammy       SUBJECTIVE:  Lindsey [name] is a 65 year old female who presents today to follow-up on her recent visit to urgent care for fevers, body aches, nausea.  She states that she acutely became ill and developed severe body aches.  She was seen at the [facility] clinic.  Blood work at that visit showed acute elevations in her liver enzymes, alkaline phosphatase, and an elevated white count.  Hepatitis blood tests were negative and her pancreatic enzymes were normal.  She was diagnosed with a probable viral illness.  Since Tuesday her fevers have subsided.  She feels much better.  Her body aches have improved.  She still feels early satiety and she continues to have nausea.  Yesterday she was only able to eat two bites of her sandwich before becoming full.  She feels a little clammy but not feverish.  She does complain of some left flank pain that she has had intermittently over the past few years.  Her stool has been regular.  She denies any steatorrhea or gray-colored stools.  She denies any vomiting.    She has lost approximately 10-15 pounds since the onset 07/13/16.  She does have a history of cholecystitis.  She had her gallbladder removed in the 69s.  Prior to that she had pancreatitis secondary to a cholangiogram that helped diagnose her cholecystitis.  She had a biliary stent placed at that time.  It has not been evaluated since.  To her knowledge there has been no additional pancreatitis events.    ROS:  Other than those listed in HPI, all other review of systems are negative    Review of patient's  allergies indicates:  Allergies   Allergen Reactions   . Pneumovax [Pneumococcal Polysaccharide Vaccine] Fever, Nausea/Vomiting and Swelling   . Tramadol Other     Heart fluttering       Current Outpatient Prescriptions   Medication Sig Dispense Refill   . Allopurinol 100 MG Oral Tab Take 1 tablet (100 mg) by mouth daily. 90 tablet 3   . ALPRAZolam 0.5 MG Oral Tab Take 0.5-1 tablets (0.25-0.5 mg) by mouth 2 times a day as needed for anxiety. 10 tablet 3   . Ascorbic Acid (VITAMIN C OR) Take by mouth daily.     . Aspirin 81 MG Oral Tab 1 TABLET DAILY     . BuPROPion HCl, SR, 150 MG Oral TABLET SR 12 HR Take 1 tablet (150 mg) by mouth 2 times a day. 180 tablet 6   . Cholecalciferol (VITAMIN D3) 2000 UNITS Oral Tab Take 1 tablet Daily     . Enalapril Maleate 5 MG Oral Tab Take 1 tablet (5 mg) by mouth daily. 90 tablet 3   . Fluticasone Propionate 50 MCG/ACT Nasal Suspension Spray in each nostril daily     . GLUCOSAMINE-CHONDROITIN OR Take by mouth daily.     . Hydrocodone-Acetaminophen 5-325 MG Oral Tab Take 1 tablet by mouth every 6 hours as needed for pain. For SEVERE joint Pain.  60 tablet 0   . Multiple Vitamin Oral Tab Take 1 tablet Daily     . Omega-3 Fatty Acids (FISH OIL) 1000 MG Oral Cap Take 1 cap Daily     . OxyCODONE HCl 5 MG Oral Tab Take 1 tablet (5 mg) by mouth every 6 hours as needed for pain. For Pain. 30 tablet 0   . Probiotic Product (PROBIOTIC DAILY OR) Take by mouth daily.     . Simvastatin 40 MG Oral Tab Take 0.5 tablets (20 mg) by mouth daily. 135 tablet 0   . TraZODone HCl 50 MG Oral Tab Take 1-2 tablets (50-100 mg) by mouth at bedtime as needed for sleep. For sleep 180 tablet 2   . Triamterene-HCTZ 37.5-25 MG Oral Tab Take 1 tablet by mouth daily. 90 tablet 1   . Venlafaxine HCl ER 75 MG Oral CAPSULE SR 24 HR Take 1 capsule (75 mg) by mouth daily. 30 capsule 11   . Vitamin E 400 UNITS Oral Tab Take 1 tablet Daily       No current facility-administered medications for this visit.        Family  History   Problem Relation Age of Onset   . Hypertension Mother    . Depression Mother    . Asthma Mother    . Broken Bones Mother    . Kidney Disorder Mother    . Lipids/Cholesterol Mother    . Osteoporosis Mother    . Hypertension Father    . Heart Attack Father    . Lipids/Cholesterol Father    . Cancer Sister    . Thyroid Disease Sister    . Cancer Brother    . Arthritis Brother    . Hypertension Maternal Grandmother    . Arthritis Maternal Grandmother    . Depression Maternal Grandmother    . Stroke Maternal Grandmother    . Osteoporosis Maternal Grandmother    . Hypertension Paternal Grandmother    . Heart Attack Paternal Grandmother    . Osteoporosis Paternal Grandmother    . Cancer Paternal Grandfather    . Alcohol/Drug Sister      alcoholism/ drug addiction   . Alcohol/Drug Son      alcoholism/ drug addiction   . Cancer Son 71     colon-early stages   . Depression Sister    . Depression Son    . Arthritis Maternal Grandfather        Social History     Social History   . Marital status: Married     Spouse name: N/A   . Number of children: N/A   . Years of education: N/A     Occupational History   . Not on file.     Social History Main Topics   . Smoking status: Former Smoker     Packs/day: 1.00     Years: 40.00     Types: Cigarettes     Quit date: 05/05/2011   . Smokeless tobacco: Never Used   . Alcohol use 0.0 - 3.0 oz/week   . Drug use: No   . Sexual activity: Yes     Partners: Male     Birth control/ protection: Surgical     Other Topics Concern   . Not on file     Social History Narrative    10/04/13: Married, 3 children.  Not working currently.    05/15/16: Married, 3 children.  Not currently working, husband travels a lot for work.  OBJECTIVE: Ht 5\' 10"  (1.778 m) Wt 280 lb (127.007 kg) Body mass index is 40.18 kg/m. BP 122/88   [name] 90   Temp 98.2 F (36.8 C) (Oral)   Ht 5\' 10"  (1.778 m)   Wt (!) 280 lb (127 kg)   SpO2 98%   BMI 40.18 kg/m    GEN:  Well developed, well nourished female  in no acute distress, sitting comfortably in the exam room.  HEENT:  AT/NC.  Pupils equal, round and reactive to light.  Extra occular muscles intact.  There is no jaundice.  NECK:  Supple.  No LAD  LUNGS: CTA bilaterally.  No R/W/R.  CARDIOVASCULAR: RRR, no murmurs, gallops or rubs.  ABDOMEN: She has normal bowel sounds.  There is minimal discomfort in the epigastrium and left upper quadrant.  There is no rebound or guarding    NEURO:  Alert and Oriented times three.  DTR's equal and symmetric.  Affect is normal.    LABS:  Results for orders placed or performed in visit on 07/14/16   COMPREHENSIVE METABOLIC PANEL   Result Value Ref Range    Sodium 140 135 - 145 meq/L    Potassium 4.2 3.6 - 5.2 meq/L    Chloride 103 98 - 108 meq/L    Carbon Dioxide, Total 26 22 - 32 meq/L    Anion Gap 11 4 - 12    Glucose 134 (H) 62 - 125 mg/dL    Urea Nitrogen 20 8 - 21 mg/dL    Creatinine 1.13 (H) 0.38 - 1.02 mg/dL    Protein (Total) 6.7 6.0 - 8.2 g/dL    Albumin 3.7 3.5 - 5.2 g/dL    Bilirubin (Total) 0.4 0.2 - 1.3 mg/dL    Calcium 9.2 8.9 - 10.2 mg/dL    AST (GOT) 29 9 - 38 U/L    Alkaline Phosphatase (Total) 181 (H) 38 - 172 U/L    ALT (GPT) 127 (H) 7 - 33 U/L    GFR, Calc, European American 48 (L) >59 mL/min/[1.73_m2]    GFR, Calc, African American 59 (L) >59 mL/min/[1.73_m2]    GFR, Information       Calculated GFR in mL/min/1.73 m2 by MDRD equation.  Inaccurate with changing renal function.  See http://depts.[email or web address].html   BILIRUBIN (DIRECT)   Result Value Ref Range    Bilirubin (Direct) 0.1 0.0 - 0.3 mg/dL   CBC, DIFF   Result Value Ref Range    WBC 4.61 4.3 - 10.0 10*3/uL    RBC 4.28 3.80 - 5.00 10*6/uL    Hemoglobin 12.6 11.5 - 15.5 g/dL    Hematocrit 40 36 - 45 %    MCV 92 81 - 98 fL    MCH 29.4 27.3 - 33.6 pg    MCHC 31.9 (L) 32.2 - 36.5 g/dL    Platelet Count 266 150 - 400 10*3/uL    RDW-CV 13.5 11.6 - 14.4 %    % Neutrophils 54 %    % Lymphocytes 33 %    % Monocytes 9 %    % Eosinophils 3  %    % Basophils 1 %    % Immature Granulocytes 0 %    Neutrophils 2.48 1.80 - 7.00 10*3/uL    Absolute Lymphocyte Count 1.54 1.00 - 4.80 10*3/uL    Monocytes 0.41 0.00 - 0.80 10*3/uL    Absolute Eosinophil Count 0.14 0.00 - 0.50 10*3/uL    Basophils 0.03 0.00 - 0.20 10*3/uL    Immature Granulocytes  0.01 0.00 - 0.05 10*3/uL    Nucleated RBC 0.00 0.00 10*3/uL    % Nucleated RBC 0 %       ASSESSMENT:  65 year old healthy female with:  (R74.8) Elevated liver enzymes  (primary encounter diagnosis)  (R10.84) Generalized abdominal pain  (Z87.19) H/O acute pancreatitis  [id number]) History of biliary stent insertion    PLAN:  I reviewed her repeat blood work today, and her blood work from Tuesday.  I'm encouraged by the fact that her WBC count has improved, her AST and ALT have both improved.  I am concerned that she may have some issues with her chronic iliac artery stent.  Her initial WBC count showed a left shift.  I'm also concerned that she may have acute or chronic pancreatitis with possible pseudocysts.  There is even a potential concern for malignancy.  At this point we will do nothing to treat her immediate illness.  She still instructed to avoid Tylenol.  We will repeat her LFTs and bilirubin next week.  Additionally I like to get a CT scan to evaluate her liver, bile ducts, pancreas and whether not there is a pseudocyst.  I will contact her with the results.    F/U:  Next week by phone or PRN      Nelda Marseille. Gian Ybarra, MD  [facility]  [address], [place] [address]  T: [phone]  F: [phone]      This document was generated in part using voice recognition technology.  Although every effort was made to insure accuracy, transcription errors may occur.

## 2016-07-17 NOTE — Telephone Encounter (Signed)
Per authorization dept, patient's CD abd/pelvis scan that was ordered today needs to be changed to [place] location per her insurance.  Called and left message for patient with this information.  New order pended and prior order for [facility] canceled.

## 2016-07-20 ENCOUNTER — Encounter (INDEPENDENT_AMBULATORY_CARE_PROVIDER_SITE_OTHER): Payer: Self-pay | Admitting: Family Medicine

## 2016-07-23 ENCOUNTER — Encounter (INDEPENDENT_AMBULATORY_CARE_PROVIDER_SITE_OTHER): Payer: Self-pay | Admitting: Family Medicine

## 2016-07-27 ENCOUNTER — Encounter (INDEPENDENT_AMBULATORY_CARE_PROVIDER_SITE_OTHER): Payer: Self-pay | Admitting: Family Medicine

## 2016-07-27 ENCOUNTER — Other Ambulatory Visit (INDEPENDENT_AMBULATORY_CARE_PROVIDER_SITE_OTHER): Payer: Self-pay | Admitting: Family Medicine

## 2016-07-27 ENCOUNTER — Ambulatory Visit: Payer: 59 | Attending: Family Medicine

## 2016-07-27 DIAGNOSIS — R748 Abnormal levels of other serum enzymes: Secondary | ICD-10-CM | POA: Insufficient documentation

## 2016-07-27 DIAGNOSIS — N2889 Other specified disorders of kidney and ureter: Secondary | ICD-10-CM

## 2016-07-27 DIAGNOSIS — N289 Disorder of kidney and ureter, unspecified: Secondary | ICD-10-CM

## 2016-07-27 LAB — COMPREHENSIVE METABOLIC PANEL
ALT (GPT): 24 U/L (ref 7–33)
AST (GOT): 14 U/L (ref 9–38)
Albumin: 3.7 g/dL (ref 3.5–5.2)
Alkaline Phosphatase (Total): 116 U/L (ref 38–172)
Anion Gap: 9 (ref 4–12)
Bilirubin (Total): 0.3 mg/dL (ref 0.2–1.3)
Calcium: 9 mg/dL (ref 8.9–10.2)
Carbon Dioxide, Total: 26 meq/L (ref 22–32)
Chloride: 100 meq/L (ref 98–108)
Creatinine: 1.17 mg/dL — ABNORMAL HIGH (ref 0.38–1.02)
GFR, Calc, African American: 56 mL/min/{1.73_m2} — ABNORMAL LOW (ref >59)
GFR, Calc, European American: 46 mL/min/{1.73_m2} — ABNORMAL LOW (ref >59)
Glucose: 99 mg/dL (ref 62–125)
Potassium: 4 meq/L (ref 3.6–5.2)
Protein (Total): 6.3 g/dL (ref 6.0–8.2)
Sodium: 135 meq/L (ref 135–145)
Urea Nitrogen: 27 mg/dL — ABNORMAL HIGH (ref 8–21)

## 2016-07-27 LAB — BILIRUBIN (DIRECT): Bilirubin (Direct): 0.1 mg/dL (ref 0.0–0.3)

## 2016-07-28 ENCOUNTER — Ambulatory Visit: Payer: 59 | Attending: Family Medicine

## 2016-07-28 DIAGNOSIS — N2889 Other specified disorders of kidney and ureter: Secondary | ICD-10-CM | POA: Insufficient documentation

## 2016-07-28 DIAGNOSIS — N289 Disorder of kidney and ureter, unspecified: Secondary | ICD-10-CM | POA: Insufficient documentation

## 2016-07-29 ENCOUNTER — Other Ambulatory Visit (INDEPENDENT_AMBULATORY_CARE_PROVIDER_SITE_OTHER): Payer: Self-pay | Admitting: Family Medicine

## 2016-07-29 DIAGNOSIS — N2889 Other specified disorders of kidney and ureter: Secondary | ICD-10-CM

## 2016-07-29 NOTE — Telephone Encounter (Signed)
Results sent through a result encounter.  Closing this eCare encounter.

## 2016-07-30 ENCOUNTER — Encounter (INDEPENDENT_AMBULATORY_CARE_PROVIDER_SITE_OTHER): Payer: Self-pay | Admitting: Family Medicine

## 2016-07-30 ENCOUNTER — Encounter (INDEPENDENT_AMBULATORY_CARE_PROVIDER_SITE_OTHER): Payer: Self-pay

## 2016-07-30 DIAGNOSIS — N2889 Other specified disorders of kidney and ureter: Secondary | ICD-10-CM

## 2016-07-31 ENCOUNTER — Encounter (INDEPENDENT_AMBULATORY_CARE_PROVIDER_SITE_OTHER): Payer: Self-pay

## 2016-07-31 ENCOUNTER — Telehealth (INDEPENDENT_AMBULATORY_CARE_PROVIDER_SITE_OTHER): Payer: Self-pay | Admitting: Family Medicine

## 2016-07-31 NOTE — Telephone Encounter (Signed)
Lindsey [name] called from [facility] in regards to the referral for an MRI that they received today. The insurance company only listed authorization for today, and they need that extended. Please fax to: [phone]. Call [facility] with any questions.

## 2016-07-31 NOTE — Telephone Encounter (Signed)
Patient needs new order for MRI Abd.  She can get in sooner at [facility] than with [facility] and she states [facility] has verified that she does not have a biliary stent and can proceed with the MRI.  New order for [facility] pended.

## 2016-08-01 NOTE — Telephone Encounter (Signed)
Attempted to call [facility] but they did not have anyone in the office in scheduling or insurance who could answer my questions about this message.  Will call Monday.

## 2016-08-03 NOTE — Telephone Encounter (Signed)
Called and left message for [place] @ [facility].  Requested she call back to clarify what she needs from [place] for the patient to get her MRI.  MRI order was faxed to them 07/31/16

## 2016-08-04 NOTE — Telephone Encounter (Signed)
Called and spoke with Anderson Malta at [facility].  She states she got things figured out for the authorization for the MRI and nothing is needed at this time.

## 2016-08-07 ENCOUNTER — Encounter (INDEPENDENT_AMBULATORY_CARE_PROVIDER_SITE_OTHER): Payer: Self-pay | Admitting: Family Medicine

## 2016-08-07 DIAGNOSIS — N2889 Other specified disorders of kidney and ureter: Secondary | ICD-10-CM

## 2016-08-11 NOTE — Telephone Encounter (Signed)
Called and spoke with patient; per Dr. Marylin Crosby, report from [facility] from abdominal MRI shows:  "Enhancing 3.1 left renal mass extends outward from the normal renal contour consistent with solid mass, suspicious for renal cell carcinoma. No extension to the perirenal fascia is identified. No enlarged lymph nodes are seen. The left renal vein is patent. "    Discussed with patient who states she is ready to figure out how to treat this and do what she needs to do.  She states she is dreading telling her family though and feels this will be the most difficult part.    She is aware that Dr. Marylin Crosby is looking into who/where we need to have her go and once we have that information, we will call her.  She agreed.

## 2016-08-12 ENCOUNTER — Telehealth (HOSPITAL_BASED_OUTPATIENT_CLINIC_OR_DEPARTMENT_OTHER): Payer: Self-pay | Admitting: Urology

## 2016-08-12 NOTE — Telephone Encounter (Signed)
Transferred referral to [facility] for renal mass. Cld and informed pt and gave info to [facility].  Closing encounter.

## 2016-08-12 NOTE — Telephone Encounter (Signed)
Per Dr. Marylin Crosby, patient to make an appt with Dr. Wilhemina Bonito at [facility] (urology oncology).  Called patient and gave her this information and she will call once the referral has been sent (will eCare patient once this is done).  If she has any issues with scheduling, she will let me know.    Referral pended.

## 2016-08-12 NOTE — Telephone Encounter (Signed)
(  TEXTING IS AN O PTION FOR [facility] ONLY)  Is this a [facility] clinic? No      RETURN CALL: General message OK      SUBJECT:  Appointment Request     REASON FOR REQUEST/SYMPTOMS: Left renal mass  REFERRING PROVIDER: Caren Hazy DALE   REQUEST APPOINTMENT WITH: Dr. Simeon Craft  REQUESTED DATE: Verify with caller , TIME: Verify with caller   UNABLE TO APPOINT BECAUSE: Patient states she is returning a call for scheduling. [facility] unable to schedule diagnosis.     Please call back to further assist. Thanks!

## 2016-08-18 ENCOUNTER — Other Ambulatory Visit (INDEPENDENT_AMBULATORY_CARE_PROVIDER_SITE_OTHER): Payer: Self-pay | Admitting: Family Medicine

## 2016-08-18 ENCOUNTER — Encounter (INDEPENDENT_AMBULATORY_CARE_PROVIDER_SITE_OTHER): Payer: Self-pay | Admitting: Family Medicine

## 2016-08-18 DIAGNOSIS — F329 Major depressive disorder, single episode, unspecified: Secondary | ICD-10-CM

## 2016-08-18 DIAGNOSIS — M47816 Spondylosis without myelopathy or radiculopathy, lumbar region: Secondary | ICD-10-CM

## 2016-08-18 DIAGNOSIS — M545 Low back pain, unspecified: Secondary | ICD-10-CM

## 2016-08-18 MED ORDER — HYDROCODONE-ACETAMINOPHEN 5-325 MG OR TABS
1.0000 | ORAL_TABLET | Freq: Four times a day (QID) | ORAL | 0 refills | Status: DC | PRN
Start: 2016-08-18 — End: 2016-10-02

## 2016-08-18 MED ORDER — TRAZODONE HCL 50 MG OR TABS
50.0000 mg | ORAL_TABLET | Freq: Every evening | ORAL | 2 refills | Status: DC | PRN
Start: 2016-08-18 — End: 2017-11-17

## 2016-08-18 NOTE — Telephone Encounter (Signed)
Medication Requested: Trazodone 50 mg, by patient eCare-to new mail order pharmacy  Last seen:  07/17/16 for Elevated liver enzymes, abd pain  Last seen for diagnosis pertinent to refill: 05/15/16 (MDD)  Last labs (as applicable): 0000000  Last fill (as applicable): n/a  Follow up due: 1 year from last physical   Does patient need to be contacted?  NO

## 2016-08-18 NOTE — Telephone Encounter (Signed)
Rx placed in will call and patient notified via [facility].

## 2016-08-18 NOTE — Telephone Encounter (Signed)
Medication Requested: Norco 5/325 , by patient eCare  Last seen:  07/17/16 for Elevated liver enzymes, abd pain  Last seen for diagnosis pertinent to refill: 05/15/16  Last labs (as applicable): 0000000  Last fill (as applicable): 0000000 checked and verified.  (was on oxycodone with recent abd pain, but she wants to go back to hydrocodone-PMP verified for oxycodone as well)  Follow up due: 1 year from last physical   Does patient need to be contacted?  NO

## 2016-08-18 NOTE — Telephone Encounter (Signed)
From: Leilani Able Dacruz  Sent: 08/18/2016 4:00 PM PDT  Subject: Medication Renewal Request    Lindsey [name] would like a refill of the following medications:  TraZODone HCl 50 MG Oral Tab [Justin D Rothmier, MD]    Preferred pharmacy: [facility] [phone]     Comment:  This will be the first time I've renewed this with [employer]. Thanks! [name]

## 2016-08-26 NOTE — Progress Notes (Signed)
UROLOGY OUTPATIENT CONSULTATION:     CHIEF COMPLAINT  Left renal mass.    HISTORY OF PRESENT ILLNESS  Lindsey [name] is a 65 year old female referred to me by Dr. Marylin Crosby for evaluation and management of newly identified left renal mass. The history is from the patient as well as from review of outside medical records. The patient had a recent episode of likely viral hepatitis, which prompted at CT abdomen for rising LFT's.  She was found to have a 3.2 x 3.1 x 2.5 cm lower pole mass in the left kidney with a margin in the renal sinus. Subsequently, she had a renal U/S and MRI which showed an enhancing 3.1 cm left renal mass without extension into the perirenal fascia, no lymphadenopathy and a patent left renal vein. She has had some lingering left flank pain for some time now, but denies any hematuria. She's had a 10-15 pound unintentional weight loss over the past few months, but feels like it was likely due to her acute viral illness. Of note, her creatinine baseline is around 1, but acutely rose to 1.17 in August. The patient quite smoking 5 years ago. Prior to that, she had a 40 pack-year history. The patient's mother passed away from "kidney disease", but the patient is unsure of the details. She has a BMI of 41.61.    Past Medical Hx:    Patient Active Problem List   Diagnosis   . Palpitations   . Hypertension   . Hyperlipidemia   . Gout   . Former smoker   . Sleep apnea   . Shortness of breath   . Basal cell carcinoma   . Squamous cell carcinoma ([place])   . Depression, major ([place])   . Lumbar degenerative disc disease   . Fracture, radius, distal   . Anxiety   . Cellulitis of face   . Morbid obesity ([place])   . Osteoarthritis of both knees   . Spondylosis of lumbar region without myelopathy or radiculopathy   . Chronic bilateral low back pain without sciatica   . Lumbar facet joint pain   . History of back surgery   . Other specified health status   . Coronary artery calcification seen on CT scan   . H/O  acute pancreatitis   . History of biliary stent insertion       Past Surgical Hx:    Past Surgical History:   Procedure Laterality Date   . BACK SURGERY  2004    thoracic fusion   . CHOLECYSTECTOMY  1990's    schincterotomy   . MOHS ANY STAGE > 5 SPEC EACH  2012, 2013    SCC R shin, BCC R leg/face   . NJX DX/THER AGT PVRT FACET JT LMBR/SAC 1 LEVEL Bilateral 09/17/15    L4-5 Medial Branch Block   . NJX DX/THER AGT PVRT FACET JT LMBR/SAC 2ND LEVEL Bilateral 09/17/15    L5-S1 Medial Branch Block   . TOTAL ABDOMINAL HYSTERECT W/WO RMVL TUBE OVARY  1998    BSO; fibroids   . UNLISTED PROCEDURE LEG/ANKLE  1980's       Family Hx:    family history includes Alcohol/Drug in her sister and son; Arthritis in her brother, maternal grandfather, and maternal grandmother; Asthma in her mother; Broken Bones in her mother; Cancer in her brother, paternal grandfather, and sister; Cancer (age of onset: 29) in her son; Depression in her maternal grandmother, mother, sister, and son; Heart Attack in her father  and paternal grandmother; Hypertension in her father, maternal grandmother, mother, and paternal grandmother; Kidney Disorder in her mother; Lipids/Cholesterol in her father and mother; Osteoporosis in her maternal grandmother, mother, and paternal grandmother; Stroke in her maternal grandmother; Thyroid Disease in her sister.    Social Hx:   reports that she quit smoking about 5 years ago. Her smoking use included Cigarettes. She has a 40.00 pack-year smoking history. She has never used smokeless tobacco. She reports that she drinks alcohol. She reports that she does not use drugs.    Active Meds:    No outpatient prescriptions have been marked as taking for the 08/27/16 encounter (Appointment) with Lauree Chandler, MD.       Allergies:    Allergies as of 08/27/2016 - Reviewed 07/17/2016   Allergen Reaction Noted   . Pneumovax [pneumococcal polysaccharide vaccine] Fever, Nausea/Vomiting, and Swelling 07/03/2013   . Tramadol Other  08/29/2015       Review of Systems:   A 14-system review of systems was filled out by the patient and reviewed by me and can be linked to in the chart dated today.    OBJECTIVE:  Physical Exam  Vitals:    Vitals 07/17/2016   Height 5\' 10"    Weight 280 lb   BMI (kg/m2) 40.18   Temp [id number]   Systolic [id number]   Diastolic 88   BP Position Sitting   BP Cuff Size Large   BP Site Left Arm   Pulse 90   Resp -   SpO2 98 %   Pain Level -   Location of pain -   Have you fallen in the past year? -   Are you afraid of falling? -   Having Periods -   LMP Reason -   General: Lindsey [name] appears comfortable and in no acute distress.    Labs Reviewed today with the patient include:  Results for orders placed or performed in visit on 07/27/16   COMPREHENSIVE METABOLIC PANEL   Result Value Ref Range    Sodium 135 135 - 145 meq/L    Potassium 4.0 3.6 - 5.2 meq/L    Chloride 100 98 - 108 meq/L    Carbon Dioxide, Total 26 22 - 32 meq/L    Anion Gap 9 4 - 12    Glucose 99 62 - 125 mg/dL    Urea Nitrogen 27 (H) 8 - 21 mg/dL    Creatinine 1.17 (H) 0.38 - 1.02 mg/dL    Protein (Total) 6.3 6.0 - 8.2 g/dL    Albumin 3.7 3.5 - 5.2 g/dL    Bilirubin (Total) 0.3 0.2 - 1.3 mg/dL    Calcium 9.0 8.9 - 10.2 mg/dL    AST (GOT) 14 9 - 38 U/L    Alkaline Phosphatase (Total) 116 38 - 172 U/L    ALT (GPT) 24 7 - 33 U/L    GFR, Calc, European American 46 (L) >59 mL/min/[1.73_m2]    GFR, Calc, African American 56 (L) >59 mL/min/[1.73_m2]    GFR, Information       Calculated GFR in mL/min/1.73 m2 by MDRD equation.  Inaccurate with changing renal function.  See http://depts.[email or web address].html   BILIRUBIN (DIRECT)   Result Value Ref Range    Bilirubin (Direct) 0.1 0.0 - 0.3 mg/dL       IMAGING:  I personally reviewed the CT dated 07/27/2016 and MRI dated 08/07/16 demonstrating a left lower pole renal mass that measures 3.2 cm in maximal dimension. The mass  is located at the lower pole and is 50% exophytic. The mass is solid with overt  enhancement concerning for RCC. Other notable features of the mass include its homogenous density.    ASSESSMENT:   Lindsey [name] is a 65 year old female with a new diagnosis of a left renal mass suspicious for kidney cancer.     PLAN:  1. I had a long discussion with Lindsey [name] regarding the nature of renal tumors including the likelihood that this represents a renal cell carcinoma. We discussed the staging of kidney cancer and our impression that-if this is a cancer-this renal mass would be a clinical stage T1a renal cell carcinoma. We discussed options for management of this renal mass including active surveillance and intervention with either ablation or extirpation. We also discussed the option of renal mass biopsy and the rationale for proceeding with renal mass biopsy.  2. We discussed that, for surgery for a renal mass the first consideration is whether or not the renal mass is amenable to nephron-sparing surgery. We discussed that the renal mass we are reviewing is amenable to nephron-sparing surgery such as with partial nephrectomy for the following reasons: size, location. We discussed that this surgery could be accomplished through minimally-invasive techniques such as with robot-assisted or laparoscopic techniques. We reviewed the surgical and technical risks as well as the medical and anesthetic risks. We completed the preoperative packet and informed consent document for a left robotic partial nephrectomy.   3. We discussed the risks, benefits, and alternatives to proceeding with renal mass biopsy. These include the risks of bleeding, infection, and non-diagnosis. We have submitted an order for renal mass biopsy today and-once scheduled-we will follow-up on the biopsy results. If benign, we would cancel the scheduled surgery and see Lindsey [name] back in follow-up.    Thank you very much for referring Lindsey [name] to our clinic at the [facility].  Total time of visit was approximately 45  minutes of which > 50% was spent in counseling and or coordination of care.

## 2016-08-27 ENCOUNTER — Ambulatory Visit: Payer: 59 | Attending: Urology | Admitting: Urology

## 2016-08-27 ENCOUNTER — Encounter (HOSPITAL_BASED_OUTPATIENT_CLINIC_OR_DEPARTMENT_OTHER): Payer: Self-pay | Admitting: Urology

## 2016-08-27 VITALS — BP 166/99 | HR 85 | Temp 97.9°F | Ht 70.0 in | Wt 290.0 lb

## 2016-08-27 DIAGNOSIS — N2889 Other specified disorders of kidney and ureter: Secondary | ICD-10-CM | POA: Insufficient documentation

## 2016-08-27 DIAGNOSIS — Z6841 Body Mass Index (BMI) 40.0 and over, adult: Secondary | ICD-10-CM

## 2016-08-27 NOTE — Progress Notes (Signed)
Topic(s) Taught:  Patient to be scheduled for Partial Nephrectomy by Dr. Simeon Craft and colleagues on TBD    Preoperative educational material reviewed with patient are as follows:  [X]  Preparing for Surgery   About Your Surgery Experience    Medicines to Avoid Before Surgery   Recovering from Surgery   Constipation after Your Operation   Deep Vein Thrombosis (DVT)   Partners in Care   MRSA Fact Sheet  [X]  Information about [place]  [X]  Procedure specific education included:   Nephrectomy Discharge Instructions     Medication Instructions:  [Y] Medication review done.  Patient to take BP meds the morning of surgery with a sip of water.   [Y] Patient instructed to hold herbal supplements, Omega 3/Fish oil and all NSAIDS (Ibuprofen, Naproxen, meloxicam, diclofenac, Aspirin, etc...)  7 days prior to surgery.  [N] Patient instructed to hold [N/A] 24 hours prior to procedure start      Preop Care Instructions:  Fasting guidelines reviewed.  Patient was instructed to be NPO after midnight unless different as instructed by PAC.  Patient instructed that NPO includes holding water, gum, candy, liquids, etc.  Patient instructed they may take their morning medications with no more than two (2) tablespoons of water or just enough water to swallow medications.  Patient informed that should OR provide different instructions the patient is to follow the OR instructions.    Preop shower and shaving guidelines reviewed.  Patient was given antibacterial soap to shower or bathe from the neck down night before and morning of surgery.  Soap is not to be used on sensitive areas including the face and scalp or incisions post op. Patient was instructed not to use make-up, deodorant, lotions, hair products, or fragrances on the day of surgery.     Postop Care Instructions:  Encouraged patient to ambulate frequently (as tolerated) immediately after surgery and do coughing and deep breathing exercises, 10 breathes, every one (1) to two (2)  hours for two (2) weeks post-op.    RN instructed that swelling, redness and pain in the lower extremities can be a sign of a blood clot in the blood vessels and to call the clinic if this is experienced.    Reviewed the need to drink plenty of fluids and follow a bowel regimen while on narcotics to avoid constipation.    Advised patient to have a responsible adult stay with them 24 hours after surgery; patient agreed to make these arrangements.  Additionally patient should avoid driving or operating machinery, exercising and legal or business decisions while taking narcotics, especially 24 hours after procedure.    Patient instructed to call the clinic at [phone] during clinic hours or use the paging operator at [phone] after clinic hours with further questions or concerns.  RN also advised patient that they most likely will NOT reach a live RN directly.  However messages will be sent to the nurse in-basket and calls will be returned in order of urgency.    Patient questions answered.   _______________________________________________________________________    [N] Bowel prep required   []  Moviprep with abx   []  Fleets enema night before and morning of surgery    Persons Present:  [Y] Patient  [N] Interpreter required   Language spoken [English]  [_] Other [_]    Teaching Method(s) Used:   [X]  One-on-one teaching  [X]  Network engineer  [_] Demonstration  [_] Visual tools    Response to teaching/outcomes:  [X]  Voiced  understanding  _0  Described/able to restate  [_] Further instruction/reinforcement needed  [_] Return demonstration completed successfully    Time spent with patient:  [_] 0 - 4 minutes   [_] 5 minutes   [_] 6 - 15 minutes   _1  16 - 30 minutes   [_] Over 30 minutes

## 2016-09-04 ENCOUNTER — Ambulatory Visit (HOSPITAL_BASED_OUTPATIENT_CLINIC_OR_DEPARTMENT_OTHER): Payer: 59 | Admitting: Radiology Med

## 2016-09-04 ENCOUNTER — Ambulatory Visit (HOSPITAL_BASED_OUTPATIENT_CLINIC_OR_DEPARTMENT_OTHER): Payer: 59

## 2016-09-04 ENCOUNTER — Ambulatory Visit
Admission: RE | Admit: 2016-09-04 | Discharge: 2016-09-04 | Disposition: A | Discharge status: Home / Self Care | Payer: 59 | Attending: Diagnostic Radiology | Admitting: Diagnostic Radiology

## 2016-09-04 ENCOUNTER — Ambulatory Visit (HOSPITAL_BASED_OUTPATIENT_CLINIC_OR_DEPARTMENT_OTHER): Payer: 59 | Admitting: Diagnostic Radiology

## 2016-09-04 ENCOUNTER — Other Ambulatory Visit (HOSPITAL_BASED_OUTPATIENT_CLINIC_OR_DEPARTMENT_OTHER): Payer: Self-pay | Admitting: Diagnostic Radiology

## 2016-09-04 DIAGNOSIS — Z9889 Other specified postprocedural states: Secondary | ICD-10-CM | POA: Insufficient documentation

## 2016-09-04 DIAGNOSIS — N2889 Other specified disorders of kidney and ureter: Secondary | ICD-10-CM | POA: Insufficient documentation

## 2016-09-04 DIAGNOSIS — C642 Malignant neoplasm of left kidney, except renal pelvis: Secondary | ICD-10-CM

## 2016-09-04 LAB — CBC (HEMOGRAM)
Hematocrit: 37 % (ref 36–45)
Hemoglobin: 11.9 g/dL (ref 11.5–15.5)
MCH: 29.5 pg (ref 27.3–33.6)
MCHC: 32.2 g/dL (ref 32.2–36.5)
MCV: 92 fL (ref 81–98)
Platelet Count: 303 10*3/uL (ref 150–400)
RBC: 4.03 10*6/uL (ref 3.80–5.00)
RDW-CV: 13.5 % (ref 11.6–14.4)
WBC: 7.53 10*3/uL (ref 4.3–10.0)

## 2016-09-04 LAB — PROTHROMBIN TIME
Prothrombin INR: 1 (ref 0.8–1.3)
Prothrombin Time Patient: 12.7 s (ref 10.7–15.6)

## 2016-09-08 LAB — PATHOLOGY, SURGICAL

## 2016-09-09 ENCOUNTER — Telehealth (HOSPITAL_BASED_OUTPATIENT_CLINIC_OR_DEPARTMENT_OTHER): Payer: Self-pay | Admitting: Urology

## 2016-09-09 NOTE — Telephone Encounter (Signed)
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 We will proceed with robotic partial nephrectomy as planned.

## 2016-09-10 ENCOUNTER — Encounter (INDEPENDENT_AMBULATORY_CARE_PROVIDER_SITE_OTHER): Payer: Self-pay | Admitting: Family Medicine

## 2016-10-02 ENCOUNTER — Other Ambulatory Visit (INDEPENDENT_AMBULATORY_CARE_PROVIDER_SITE_OTHER): Payer: Self-pay | Admitting: Family Medicine

## 2016-10-02 ENCOUNTER — Encounter (INDEPENDENT_AMBULATORY_CARE_PROVIDER_SITE_OTHER): Payer: Self-pay | Admitting: Family Medicine

## 2016-10-02 DIAGNOSIS — M47816 Spondylosis without myelopathy or radiculopathy, lumbar region: Secondary | ICD-10-CM

## 2016-10-02 DIAGNOSIS — Z760 Encounter for issue of repeat prescription: Secondary | ICD-10-CM

## 2016-10-02 DIAGNOSIS — M545 Low back pain, unspecified: Secondary | ICD-10-CM

## 2016-10-02 MED ORDER — SIMVASTATIN 40 MG OR TABS
ORAL_TABLET | ORAL | 0 refills | Status: DC
Start: 2016-10-02 — End: 2017-01-04

## 2016-10-02 NOTE — Telephone Encounter (Signed)
Medication Requested: Norco 5/325, by patient [facility]  Last seen:  07/17/16 for Elevated liver enzymes, abd pain  Last seen for diagnosis pertinent to refill: 05/15/16  Last labs (as applicable): 0000000  Last fill (as applicable): 123XX123 checked and verified.  (was on oxycodone with recent abd pain, but she wants to go back to hydrocodone-PMP verified for oxycodone as well)  Follow up due: 1 year from last physical   Does patient need to be contacted?  NO

## 2016-10-02 NOTE — Telephone Encounter (Signed)
Medication Requested: Simvastatin 40 MG, by [facility]  Last fill (as applicable): 123XX123    #135  0 Refill(s)   Dx: Medication Refill        ICD-10:  Z76.0  Last seen:  02/15/2015  for Left side  Last seen for diagnosis pertinent to refill: unknown/  Last labs (as applicable): //  Follow up due: yes   Does patient need to be contacted?  YES

## 2016-10-05 MED ORDER — HYDROCODONE-ACETAMINOPHEN 5-325 MG OR TABS
1.0000 | ORAL_TABLET | Freq: Four times a day (QID) | ORAL | 0 refills | Status: DC | PRN
Start: 2016-10-05 — End: 2016-11-16

## 2016-10-05 NOTE — Telephone Encounter (Signed)
Patient notified via eCare that Rx signed by covering physician (Dr. Priscella Mann) and that it is ready for pick up.

## 2016-10-08 ENCOUNTER — Telehealth (HOSPITAL_BASED_OUTPATIENT_CLINIC_OR_DEPARTMENT_OTHER): Payer: Self-pay | Admitting: Urology

## 2016-10-08 NOTE — Telephone Encounter (Signed)
(  TEXTING IS AN OPTION FOR [facility] ONLY)  Is this a [place] clinic? No      RETURN CALL: No call back needed      SUBJECT:  General Message     REASON FOR REQUEST: Samantha with [facility] is requesting the Procedure code for 12/13 surgery for Robotic surgery to be Fax to: [phone]    MESSAGE: Aldona Bar did not have a number to directly reached her and will follow up with care team.

## 2016-10-12 NOTE — Telephone Encounter (Signed)
fyi

## 2016-10-12 NOTE — Telephone Encounter (Signed)
Added note to financial auth/cert for surgery, traditionally that department begins working cases 30 days prior.

## 2016-11-02 ENCOUNTER — Encounter (INDEPENDENT_AMBULATORY_CARE_PROVIDER_SITE_OTHER): Payer: Self-pay | Admitting: Family Medicine

## 2016-11-16 ENCOUNTER — Encounter (INDEPENDENT_AMBULATORY_CARE_PROVIDER_SITE_OTHER): Payer: Self-pay | Admitting: Family Medicine

## 2016-11-16 DIAGNOSIS — M47816 Spondylosis without myelopathy or radiculopathy, lumbar region: Secondary | ICD-10-CM

## 2016-11-16 DIAGNOSIS — M545 Low back pain, unspecified: Secondary | ICD-10-CM

## 2016-11-16 MED ORDER — HYDROCODONE-ACETAMINOPHEN 5-325 MG OR TABS
1.0000 | ORAL_TABLET | Freq: Four times a day (QID) | ORAL | 0 refills | Status: DC | PRN
Start: 2016-11-16 — End: 2016-12-29

## 2016-11-16 NOTE — Telephone Encounter (Signed)
Medication Requested: Norco 5/325, by patient [facility]  Last seen:  07/17/16 for elevated liver enzymes/abd pain  Last seen for diagnosis pertinent to refill: 05/15/16  Last labs (as applicable): n/a  Last fill (as applicable): 123XX123 (PMP checked and verified)  Follow up due: 04/2017   Does patient need to be contacted?  NO

## 2016-11-17 ENCOUNTER — Telehealth (HOSPITAL_BASED_OUTPATIENT_CLINIC_OR_DEPARTMENT_OTHER): Payer: Self-pay | Admitting: Urology

## 2016-11-17 NOTE — Telephone Encounter (Signed)
(  TEXTING IS AN OPTION FOR [facility] ONLY)  Is this a [place] clinic? No      RETURN CALL: General message OK      SUBJECT:  General Message     REASON FOR REQUEST: medical question    MESSAGE: Patient called asking to speak to a nurse regarding a cataract surgery. Please call at [phone] and advise. Patient called today and patient was on hold for a long time.

## 2016-11-17 NOTE — Telephone Encounter (Signed)
Called and left detailed VM instructing pt to call back to clinic to discuss questions.

## 2016-11-18 NOTE — Telephone Encounter (Signed)
Called and spoke to pt to discuss her concerns regarding cataract surgery.  Pt states that she has already had her questions answered and had no further concerns.

## 2016-11-19 ENCOUNTER — Ambulatory Visit: Payer: 59 | Attending: Urology

## 2016-11-19 DIAGNOSIS — N2889 Other specified disorders of kidney and ureter: Secondary | ICD-10-CM | POA: Insufficient documentation

## 2016-11-19 DIAGNOSIS — I10 Essential (primary) hypertension: Secondary | ICD-10-CM | POA: Insufficient documentation

## 2016-11-19 DIAGNOSIS — Z01810 Encounter for preprocedural cardiovascular examination: Secondary | ICD-10-CM

## 2016-12-02 ENCOUNTER — Other Ambulatory Visit (HOSPITAL_BASED_OUTPATIENT_CLINIC_OR_DEPARTMENT_OTHER): Payer: Self-pay | Admitting: Urology

## 2016-12-02 ENCOUNTER — Inpatient Hospital Stay
Admission: RE | Admit: 2016-12-02 | Discharge: 2016-12-04 | DRG: 657 | Disposition: A | Discharge status: Home / Self Care | Payer: 59 | Attending: Urology | Admitting: Urology

## 2016-12-02 ENCOUNTER — Inpatient Hospital Stay (HOSPITAL_COMMUNITY): Payer: 59 | Admitting: Urology

## 2016-12-02 DIAGNOSIS — F329 Major depressive disorder, single episode, unspecified: Secondary | ICD-10-CM | POA: Diagnosis present

## 2016-12-02 DIAGNOSIS — Z6841 Body Mass Index (BMI) 40.0 and over, adult: Secondary | ICD-10-CM

## 2016-12-02 DIAGNOSIS — Z7982 Long term (current) use of aspirin: Secondary | ICD-10-CM

## 2016-12-02 DIAGNOSIS — C649 Malignant neoplasm of unspecified kidney, except renal pelvis: Secondary | ICD-10-CM

## 2016-12-02 DIAGNOSIS — E785 Hyperlipidemia, unspecified: Secondary | ICD-10-CM | POA: Diagnosis present

## 2016-12-02 DIAGNOSIS — C642 Malignant neoplasm of left kidney, except renal pelvis: Principal | ICD-10-CM | POA: Diagnosis present

## 2016-12-02 DIAGNOSIS — Z85828 Personal history of other malignant neoplasm of skin: Secondary | ICD-10-CM

## 2016-12-02 DIAGNOSIS — Z981 Arthrodesis status: Secondary | ICD-10-CM

## 2016-12-02 DIAGNOSIS — I10 Essential (primary) hypertension: Secondary | ICD-10-CM | POA: Diagnosis present

## 2016-12-02 LAB — GLUCOSE POC, ~~LOC~~
Glucose (POC): 127 mg/dL — ABNORMAL HIGH (ref 62–125)
Glucose (POC): 154 mg/dL — ABNORMAL HIGH (ref 62–125)
Glucose (POC): 150 mg/dL — ABNORMAL HIGH (ref 62–125)
Glucose (POC): 148 mg/dL — ABNORMAL HIGH (ref 62–125)
Glucose (POC): 143 mg/dL — ABNORMAL HIGH (ref 62–125)
Glucose (POC): 155 mg/dL — ABNORMAL HIGH (ref 62–125)

## 2016-12-02 LAB — TYPE AND SCREEN
ABO/Rh: A POS
Antibody Screen: NEGATIVE

## 2016-12-02 LAB — BLOOD TYPE CONFIRMATION: ABO/Rh: A POS

## 2016-12-02 LAB — HEMATOCRIT: Hematocrit: 36 % (ref 36–45)

## 2016-12-03 DIAGNOSIS — Z48816 Encounter for surgical aftercare following surgery on the genitourinary system: Secondary | ICD-10-CM

## 2016-12-03 LAB — GLUCOSE POC, ~~LOC~~
Glucose (POC): 118 mg/dL (ref 62–125)
Glucose (POC): 112 mg/dL (ref 62–125)
Glucose (POC): 97 mg/dL (ref 62–125)
Glucose (POC): 146 mg/dL — ABNORMAL HIGH (ref 62–125)

## 2016-12-03 LAB — HEMATOCRIT: Hematocrit: 32 % — ABNORMAL LOW (ref 36–45)

## 2016-12-03 LAB — HEMOGLOBIN A1C, HPLC: Hemoglobin A1C: 6 % (ref 4.0–6.0)

## 2016-12-04 LAB — HEMATOCRIT: Hematocrit: 32 % — ABNORMAL LOW (ref 36–45)

## 2016-12-05 ENCOUNTER — Other Ambulatory Visit (INDEPENDENT_AMBULATORY_CARE_PROVIDER_SITE_OTHER): Payer: Self-pay | Admitting: Family Medicine

## 2016-12-05 DIAGNOSIS — I10 Essential (primary) hypertension: Secondary | ICD-10-CM

## 2016-12-05 LAB — GLUCOSE POC, ~~LOC~~: Glucose (POC): 125 mg/dL (ref 62–125)

## 2016-12-07 LAB — PATHOLOGY, SURGICAL

## 2016-12-07 MED ORDER — TRIAMTERENE-HCTZ 37.5-25 MG OR TABS
ORAL_TABLET | ORAL | 3 refills | Status: DC
Start: 2016-12-07 — End: 2017-03-30

## 2016-12-07 NOTE — Telephone Encounter (Signed)
Medication Requested: Triamterene-HCTZ 37.5-25 MG Oral Tab, by pharmacy  Last seen:  07/17/16 for illness  Last seen for diagnosis pertinent to refill: 05/15/16  Last labs (as applicable): 0000000  Last fill (as applicable): 0000000  Follow up due: 04/2017  Does patient need to be contacted?  NO

## 2016-12-17 ENCOUNTER — Ambulatory Visit: Payer: 59 | Attending: Family Medicine

## 2016-12-17 DIAGNOSIS — Z1231 Encounter for screening mammogram for malignant neoplasm of breast: Secondary | ICD-10-CM | POA: Insufficient documentation

## 2016-12-17 LAB — HM MAMMOGRAPHY

## 2016-12-18 ENCOUNTER — Encounter: Payer: Self-pay | Admitting: Family Medicine

## 2016-12-21 HISTORY — PX: ARM SURGERY: SHX5179

## 2016-12-24 ENCOUNTER — Ambulatory Visit: Payer: 59 | Attending: Urology | Admitting: Urology

## 2016-12-24 ENCOUNTER — Encounter (HOSPITAL_BASED_OUTPATIENT_CLINIC_OR_DEPARTMENT_OTHER): Payer: Self-pay | Admitting: Urology

## 2016-12-24 VITALS — BP 154/90 | HR 79 | Resp 18 | Ht 70.0 in | Wt 286.0 lb

## 2016-12-24 DIAGNOSIS — C642 Malignant neoplasm of left kidney, except renal pelvis: Secondary | ICD-10-CM | POA: Insufficient documentation

## 2016-12-24 NOTE — Progress Notes (Signed)
I, Wilhemina Bonito, MD, saw and evaluated this patient in a combined visit with Dr. Teryl Lucy, MD. I participated in all key parts of the visit including the medical decision-making. I reviewed the documentation provided by Dr. Teryl Lucy, and I agree with the plan of care as outlined.

## 2016-12-24 NOTE — Progress Notes (Signed)
UROLOGY CLINIC NOTE      ID/CC:    Postoperative care following robotic left partial nephrectomy.    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 [name] has recovered well from surgery and reports good appetite, no constipation, and no pain requiring narcotic pain medications. [name] has no concerns about the incisions including. They report that their energy level is excellent, possibly better than it has been in years.    Active Meds:    Outpatient Prescriptions Marked as Taking for the 12/24/16 encounter (Office Visit) with Lauree Chandler, MD   Medication Sig Dispense Refill   . Allopurinol 100 MG Oral Tab Take 1 tablet (100 mg) by mouth daily. 90 tablet 3   . ALPRAZolam 0.5 MG Oral Tab Take 0.5-1 tablets (0.25-0.5 mg) by mouth 2 times a day as needed for anxiety. 10 tablet 3   . Ascorbic Acid (VITAMIN C OR) Take by mouth daily.     . Aspirin 81 MG Oral Tab 1 TABLET DAILY     . BuPROPion HCl, SR, 150 MG Oral TABLET SR 12 HR Take 1 tablet (150 mg) by mouth 2 times a day. 180 tablet 6   . Cholecalciferol (VITAMIN D3) 2000 UNITS Oral Tab Take 1 tablet Daily     . Enalapril Maleate 5 MG Oral Tab Take 1 tablet (5 mg) by mouth daily. 90 tablet 3   . Fluticasone Propionate 50 MCG/ACT Nasal Suspension Spray in each nostril daily     . GLUCOSAMINE-CHONDROITIN OR Take by mouth daily.     . Hydrocodone-Acetaminophen 5-325 MG Oral Tab Take 1 tablet by mouth every 6 hours as needed for pain. For SEVERE joint Pain. 60 tablet 0   . Multiple Vitamin Oral Tab Take 1 tablet Daily     . Omega-3 Fatty Acids (FISH OIL) 1000 MG Oral Cap Take 1 cap Daily     . OxyCODONE HCl 5 MG Oral Tab Take 1 tablet (5 mg) by mouth every 6 hours as needed for pain. For Pain. 30 tablet 0   . Probiotic Product (PROBIOTIC DAILY OR) Take by  mouth daily.     . Simvastatin 40 MG Oral Tab TAKE ONE-HALF (1/2) TABLET DAILY 45 tablet 0   . TraZODone HCl 50 MG Oral Tab Take 1-2 tablets (50-100 mg) by mouth at bedtime as needed for sleep. For sleep 180 tablet 2   . Triamterene-HCTZ 37.5-25 MG Oral Tab TAKE 1 TABLET DAILY 90 tablet 3   . Triamterene-HCTZ 37.5-25 MG Oral Tab Take 1 tablet by mouth daily. 90 tablet 1   . Venlafaxine HCl ER 75 MG Oral CAPSULE SR 24 HR Take 1 capsule (75 mg) by mouth daily. 30 capsule 11   . Vitamin E 400 UNITS Oral Tab Take 1 tablet Daily         Allergies:    Allergies as of 12/24/2016 - Reviewed 12/24/2016   Allergen Reaction Noted   . Pneumovax [pneumococcal polysaccharide vaccine] Fever, Nausea/Vomiting, and Swelling 07/03/2013   . Tramadol Other 08/29/2015       OBJECTIVE:  Vital Signs:    Vitals 12/24/2016   Height 5\' 10"    Weight 286 lb   BMI (kg/m2) 41.04   Temp -   Systolic 123456   Diastolic 90  BP Position Sitting   BP Cuff Size Large   BP Site Right Arm   Pulse 79   Pulse Regularity  -   Resp 18   SpO2 -   Pain Level -   Location of pain -   Have you fallen in the past year? -   Are you afraid of falling? -   Having Periods -   LMP Reason -     General: Lindsey [name] appears comfortable and in no acute distress.  Abdomen: Soft, non-tender, non-distended, the incision are clean, dry, and intact.    Review of Medical Images, tracings or specimens:    Labs Reviewed today with the patient include  Admission on 12/02/2016, Discharged on 12/04/2016   Component Date Value Ref Range Status   . Units Ordered 12/02/2016 None   Final   . Physician Instructions 12/02/2016 None   Final   . ABO/Rh 12/02/2016 A POSITIVE   Final   . Antibody Screen 12/02/2016 NEGATIVE   Final   . Crossmatch Expiration 12/02/2016 12/05/2016   Final   . ABO/Rh 12/02/2016 A POSITIVE   Final   . Glucose (POC) 12/02/2016 127* 62 - 125 mg/dL Final   . Glucose (POC) 12/02/2016 154* 62 - 125 mg/dL Final   . Glucose (POC) 12/02/2016 150* 62 - 125 mg/dL Final    . Hematocrit 12/02/2016 36  36 - 45 % Final   . Glucose (POC) 12/02/2016 148* 62 - 125 mg/dL Final   . Glucose (POC) 12/02/2016 143* 62 - 125 mg/dL Final   . Glucose (POC) 12/02/2016 155* 62 - 125 mg/dL Final   . Hematocrit 12/03/2016 32* 36 - 45 % Final   . Glucose (POC) 12/03/2016 118  62 - 125 mg/dL Final   . Hemoglobin A1C 12/03/2016 6.0  4.0 - 6.0 % Final   . Glucose (POC) 12/03/2016 112  62 - 125 mg/dL Final   . Glucose (POC) 12/03/2016 97  62 - 125 mg/dL Final   . Glucose (POC) 12/03/2016 146* 62 - 125 mg/dL Final   . Hematocrit 12/04/2016 32* 36 - 45 % Final   . Glucose (POC) 12/05/2016 125  62 - 125 mg/dL Final       Pathology  I reviewed the pathology with Leilani Able Crampton which demonstrated a 3 cm T1a papillary type 1 RCC.    Assessment  Poetry Sis is a 66 year old female doing well status-post left partial nephrectomy.    Plan    I will see Lindsey [name] back in 12 months for surveillance evaluation with CT prior.    I, Oralia Rud, MD, saw and evaluated this patient in a combined visit with Dr Simeon Craft. Please see Dr Theressa Millard note for this encounter for additional details.

## 2016-12-29 ENCOUNTER — Encounter (INDEPENDENT_AMBULATORY_CARE_PROVIDER_SITE_OTHER): Payer: Self-pay | Admitting: Family Medicine

## 2016-12-29 DIAGNOSIS — M545 Low back pain, unspecified: Secondary | ICD-10-CM

## 2016-12-29 DIAGNOSIS — M47816 Spondylosis without myelopathy or radiculopathy, lumbar region: Secondary | ICD-10-CM

## 2016-12-29 NOTE — Telephone Encounter (Signed)
Medication Requested: Hydrocodone-Acetaminophen 5-325mg , by patient  Last seen:  07/17/2016 for elevated liver enzymes/abd pain  Last seen for diagnosis pertinent to refill: 05/15/2016  Last labs (as applicable): n/a  Last fill (as applicable): 0000000  Follow up due: 04/2017   Does patient need to be contacted?  NO

## 2016-12-30 MED ORDER — HYDROCODONE-ACETAMINOPHEN 5-325 MG OR TABS
1.0000 | ORAL_TABLET | Freq: Four times a day (QID) | ORAL | 0 refills | Status: DC | PRN
Start: 2016-12-30 — End: 2017-02-16

## 2016-12-30 NOTE — Telephone Encounter (Signed)
Should be seen for pain management

## 2017-01-04 ENCOUNTER — Other Ambulatory Visit (INDEPENDENT_AMBULATORY_CARE_PROVIDER_SITE_OTHER): Payer: Self-pay | Admitting: Family Medicine

## 2017-01-04 DIAGNOSIS — F331 Major depressive disorder, recurrent, moderate: Secondary | ICD-10-CM

## 2017-01-04 DIAGNOSIS — M1A00X0 Idiopathic chronic gout, unspecified site, without tophus (tophi): Secondary | ICD-10-CM

## 2017-01-04 DIAGNOSIS — Z760 Encounter for issue of repeat prescription: Secondary | ICD-10-CM

## 2017-01-04 MED ORDER — ALLOPURINOL 100 MG OR TABS
100.0000 mg | ORAL_TABLET | Freq: Every day | ORAL | 0 refills | Status: DC
Start: 2017-01-04 — End: 2017-03-30

## 2017-01-04 MED ORDER — SIMVASTATIN 40 MG OR TABS
20.0000 mg | ORAL_TABLET | Freq: Every day | ORAL | 0 refills | Status: DC
Start: 2017-01-04 — End: 2017-03-30

## 2017-01-04 MED ORDER — VENLAFAXINE HCL ER 75 MG OR CP24
75.0000 mg | EXTENDED_RELEASE_CAPSULE | Freq: Every day | ORAL | 0 refills | Status: DC
Start: 2017-01-04 — End: 2017-03-30

## 2017-01-04 NOTE — Telephone Encounter (Signed)
Medication Requested: Allopurinol 100 mg, Venlafxine ER 75 mg, by pharmacy  Last seen:  07/17/16 for elevated liver enzymes, abd pain  Last seen for diagnosis pertinent to refill: 05/15/16 (wellness)  Last labs (as applicable): 0000000  Last fill (as applicable): n/a  Follow up due: 1 year from last wellness   Does patient need to be contacted?  NO

## 2017-01-04 NOTE — Telephone Encounter (Signed)
Medication Requested: Simvastatin 40 mg, by pharmacy  Last seen:  07/17/16 for elevated liver enzymes, abd pain  Last seen for diagnosis pertinent to refill: 05/15/16 (wellness)  Last labs (as applicable): 0000000  Last fill (as applicable): n/a  Follow up due: 1 year from last wellness   Does patient need to be contacted?  NO

## 2017-01-27 ENCOUNTER — Other Ambulatory Visit (INDEPENDENT_AMBULATORY_CARE_PROVIDER_SITE_OTHER): Payer: Self-pay | Admitting: Family Medicine

## 2017-01-27 DIAGNOSIS — F419 Anxiety disorder, unspecified: Secondary | ICD-10-CM

## 2017-01-27 MED ORDER — ALPRAZOLAM 0.5 MG OR TABS
ORAL_TABLET | ORAL | 0 refills | Status: DC
Start: 2017-01-27 — End: 2017-10-12

## 2017-01-27 NOTE — Telephone Encounter (Signed)
Rx faxed to pharmacy  

## 2017-01-27 NOTE — Telephone Encounter (Signed)
Medication Requested: ALPRAZOLAM 0.5MG  TABLETS, by pharmacy  Last seen:  07/17/2016 for elevated liver enzymes  Last seen for diagnosis pertinent to refill: 05/15/2016  Last labs (as applicable): n/a  Last fill (as applicable): [id number]  Follow up due: 04/2017   Does patient need to be contacted?  NO

## 2017-02-15 ENCOUNTER — Encounter (INDEPENDENT_AMBULATORY_CARE_PROVIDER_SITE_OTHER): Payer: Self-pay | Admitting: Family Medicine

## 2017-02-15 DIAGNOSIS — M47816 Spondylosis without myelopathy or radiculopathy, lumbar region: Secondary | ICD-10-CM

## 2017-02-15 DIAGNOSIS — M545 Low back pain, unspecified: Secondary | ICD-10-CM

## 2017-02-16 MED ORDER — HYDROCODONE-ACETAMINOPHEN 5-325 MG OR TABS
1.0000 | ORAL_TABLET | Freq: Four times a day (QID) | ORAL | 0 refills | Status: DC | PRN
Start: 2017-02-16 — End: 2017-03-30

## 2017-02-16 NOTE — Telephone Encounter (Signed)
Medication Requested: Norco 5/325, by patient [facility]  Last seen:  07/17/16 for elevated LFT's, abd pain  Last seen for diagnosis pertinent to refill: 05/15/16 (low back)  Last labs (as applicable): n/a  Last fill (as applicable): [id number] (PMP checked and verified)  Follow up due: 04/2017 for wellness, NOW for pain medication   Does patient need to be contacted?  YES-eCare message sent requesting she schedule appt.      Patient also requesting Disabled parking pass renewal.  Last one dispensed 01/25/16 (temporary for 12 months)

## 2017-02-16 NOTE — Telephone Encounter (Signed)
e[facility] message sent to patient that Rx Norco and disabled parking passes are ready to be picked up, and to schedule appt prior to next refill.

## 2017-03-21 DIAGNOSIS — C439 Malignant melanoma of skin, unspecified: Secondary | ICD-10-CM

## 2017-03-21 HISTORY — DX: Malignant melanoma of skin, unspecified: C43.9

## 2017-03-29 ENCOUNTER — Encounter (INDEPENDENT_AMBULATORY_CARE_PROVIDER_SITE_OTHER): Payer: Self-pay | Admitting: Family Medicine

## 2017-03-30 ENCOUNTER — Encounter (INDEPENDENT_AMBULATORY_CARE_PROVIDER_SITE_OTHER): Payer: Self-pay | Admitting: Family Medicine

## 2017-03-30 ENCOUNTER — Ambulatory Visit (INDEPENDENT_AMBULATORY_CARE_PROVIDER_SITE_OTHER): Payer: 59 | Admitting: Family Medicine

## 2017-03-30 VITALS — BP 160/90 | HR 76 | Ht 70.0 in | Wt 290.0 lb

## 2017-03-30 DIAGNOSIS — R51 Headache: Secondary | ICD-10-CM

## 2017-03-30 DIAGNOSIS — M5136 Other intervertebral disc degeneration, lumbar region: Secondary | ICD-10-CM

## 2017-03-30 DIAGNOSIS — M1A00X0 Idiopathic chronic gout, unspecified site, without tophus (tophi): Secondary | ICD-10-CM

## 2017-03-30 DIAGNOSIS — J309 Allergic rhinitis, unspecified: Secondary | ICD-10-CM

## 2017-03-30 DIAGNOSIS — M170 Bilateral primary osteoarthritis of knee: Secondary | ICD-10-CM

## 2017-03-30 DIAGNOSIS — M545 Low back pain, unspecified: Secondary | ICD-10-CM

## 2017-03-30 DIAGNOSIS — I10 Essential (primary) hypertension: Secondary | ICD-10-CM

## 2017-03-30 DIAGNOSIS — Z6841 Body Mass Index (BMI) 40.0 and over, adult: Secondary | ICD-10-CM

## 2017-03-30 DIAGNOSIS — M47816 Spondylosis without myelopathy or radiculopathy, lumbar region: Secondary | ICD-10-CM

## 2017-03-30 DIAGNOSIS — Z760 Encounter for issue of repeat prescription: Secondary | ICD-10-CM

## 2017-03-30 DIAGNOSIS — R519 Headache, unspecified: Secondary | ICD-10-CM

## 2017-03-30 DIAGNOSIS — Z79899 Other long term (current) drug therapy: Secondary | ICD-10-CM | POA: Insufficient documentation

## 2017-03-30 DIAGNOSIS — F331 Major depressive disorder, recurrent, moderate: Secondary | ICD-10-CM

## 2017-03-30 MED ORDER — VENLAFAXINE HCL ER 75 MG OR CP24
75.0000 mg | EXTENDED_RELEASE_CAPSULE | Freq: Every day | ORAL | 3 refills | Status: AC
Start: 2017-03-30 — End: ?

## 2017-03-30 MED ORDER — SIMVASTATIN 40 MG OR TABS
20.0000 mg | ORAL_TABLET | Freq: Every day | ORAL | 3 refills | Status: AC
Start: 2017-03-30 — End: ?

## 2017-03-30 MED ORDER — ALLOPURINOL 100 MG OR TABS
100.0000 mg | ORAL_TABLET | Freq: Every day | ORAL | 3 refills | Status: AC
Start: 2017-03-30 — End: ?

## 2017-03-30 MED ORDER — BUPROPION HCL ER (SR) 150 MG OR TB12
150.0000 mg | EXTENDED_RELEASE_TABLET | Freq: Every day | ORAL | 3 refills | Status: AC
Start: 2017-03-30 — End: ?

## 2017-03-30 MED ORDER — HYDROCODONE-ACETAMINOPHEN 5-325 MG OR TABS
1.0000 | ORAL_TABLET | Freq: Four times a day (QID) | ORAL | 0 refills | Status: DC | PRN
Start: 2017-03-30 — End: 2017-05-19

## 2017-03-30 MED ORDER — TRIAMTERENE-HCTZ 37.5-25 MG OR TABS
1.0000 | ORAL_TABLET | Freq: Every day | ORAL | 3 refills | Status: DC
Start: 2017-03-30 — End: 2017-11-17

## 2017-03-30 MED ORDER — FLUTICASONE PROPIONATE 50 MCG/ACT NA SUSP
NASAL | 3 refills | Status: DC
Start: 2017-03-30 — End: 2017-10-12

## 2017-03-30 MED ORDER — ENALAPRIL MALEATE 10 MG OR TABS
10.0000 mg | ORAL_TABLET | Freq: Every day | ORAL | 3 refills | Status: AC
Start: 2017-03-30 — End: ?

## 2017-03-30 NOTE — Progress Notes (Signed)
Chief Complaint   Patient presents with    Medication Review       SUBJECTIVE:  Lindsey Lindsey [name] is a 66 year old female who presents today for follow up of all of her medications.  He next physical is no due until the end of May.    Chronic low back pain/bilateral knee pain/hand pain:  She states she is having pain in her hands, knees and back.  She continues to have back pain.  She had CT guided medial branch blocks in Dec 2016 with improvement, but she could not get her ablation approved.  Then due to other health issues last year she could not get this completed.  She continues to have pain with walking short distances.  For the most part she is taking 1 hydrocodone per day, but on occasion she takes two.  She does take some tylenol on occasion.  She denies side effects.  She is using her handicap parking placard at times.      Major Depression/anxiety:  She feels she is doing well.  She is only taking one bupropion daily.  She rarely takes alprazolam for her anxiety.   He children are doing better, so there has been less stress.  Her PHQ-9 is 6 today.  She denies homicidal or suicidal ideation today.    Morbid obesity: This is been a problem due to her chronic pain.  She states she is unable to exercise like she used to.  She's been trying a better and is actually lost some weight.    Hypertension: She does admit to feeling when her blood pressures a little high.  She feels flushed head.  She denies any chest pain or shortness of breath.  She met she's not checking her blood pressure on a regular basis.    Right facial pain: She continues to have right maxillary facial pain after having dental work in December 2016.  She's now seen her dentist and the endodontist multiple times and continues to have right-sided facial pain whenever she presses on her upper molars.  They've requested that she see an ENT.  She is requesting a referral today.    Medication refill: She needs a medication refill of all of her medications  today.      ROS:  Other than those listed in HPI, all other review of systems are negative    Review of patient's allergies indicates:  Allergies   Allergen Reactions    Pneumovax [Pneumococcal Polysaccharide Vaccine] Fever, Nausea/Vomiting and Swelling    Tramadol Other     Heart fluttering       Current Outpatient Prescriptions   Medication Sig Dispense Refill    Allopurinol 100 MG Oral Tab Take 1 tablet (100 mg) by mouth daily. 90 tablet 3    ALPRAZolam 0.5 MG Oral Tab TAKE 1/2 TO 1 TABLET BY MOUTH TWICE DAILY AS NEEDED FOR ANXIETY 10 tablet 0    Aspirin 81 MG Oral Tab 1 TABLET DAILY      BuPROPion HCl, SR, 150 MG Oral TABLET SR 12 HR Take 1 tablet (150 mg) by mouth daily. 90 tablet 3    Cholecalciferol (VITAMIN D3) 2000 UNITS Oral Tab Take 1 tablet Daily      Enalapril Maleate 10 MG Oral Tab Take 1 tablet (10 mg) by mouth daily. 90 tablet 3    Fluticasone Propionate 50 MCG/ACT Nasal Suspension Spray in each nostril daily 3 bottle 3    Hydrocodone-Acetaminophen 5-325 MG Oral Tab Take 1 tablet  by mouth every 6 hours as needed for pain. For SEVERE joint Pain. 60 tablet 0    Multiple Vitamin Oral Tab Take 1 tablet Daily      Omega-3 Fatty Acids (FISH OIL) 1000 MG Oral Cap Take 1 cap Daily      Simvastatin 40 MG Oral Tab Take 0.5 tablets (20 mg) by mouth daily. 45 tablet 3    TraZODone HCl 50 MG Oral Tab Take 1-2 tablets (50-100 mg) by mouth at bedtime as needed for sleep. For sleep 180 tablet 2    Triamterene-HCTZ 37.5-25 MG Oral Tab Take 1 tablet by mouth daily. 90 tablet 3    Venlafaxine HCl ER 75 MG Oral CAPSULE SR 24 HR Take 1 capsule (75 mg) by mouth daily. 90 capsule 3    Vitamin E 400 UNITS Oral Tab Take 1 tablet Daily       No current facility-administered medications for this visit.        Family History     Problem (# of Occurrences) Relation (Name,Age of Onset)    Alcohol/Drug (2) Sister: alcoholism/ drug addiction, Son: alcoholism/ drug addiction    Arthritis (3) Brother, Maternal  Grandmother, Maternal Grandfather    Asthma (1) Mother Lindsey Lindsey [name])    Broken Bones (1) Mother Lindsey Lindsey [name])    Cancer (4) Sister, Brother, Paternal 40, Son (25): colon-early stages    Depression (4) Mother Lindsey Lindsey [name]), Maternal Grandmother, Sister, Son    Heart Attack (2) Father Lindsey Lindsey [name]), Paternal Grandmother    Hypertension (4) Mother Lindsey Lindsey [name]), Father Lindsey Lindsey [name]), Maternal Grandmother, Paternal Grandmother    Kidney Disorder (1) Mother Lindsey Lindsey [name])    Lipids/Cholesterol (2) Mother Lindsey Lindsey [name]), Father Lindsey Lindsey [name])    Osteoporosis (3) Mother Lindsey Lindsey [name]), Maternal Grandmother, Paternal Grandmother    Stroke (1) Maternal Grandmother    Thyroid Disease (1) Sister          Social History     Social History    Marital status: Married     Spouse name: N/A    Number of children: N/A    Years of education: N/A     Occupational History    Not on file.     Social History Main Topics    Smoking status: Former Smoker     Packs/day: 1.00     Years: 40.00     Types: Cigarettes     Quit date: 05/05/2011    Smokeless tobacco: Never Used    Alcohol use 0.0 - 3.0 oz/week    Drug use: No    Sexual activity: Yes     Partners: Male     Birth control/ protection: Surgical     Other Topics Concern    Not on file     Social History Narrative    10/04/13: Married, 3 children.  Not working currently.    05/15/16: Married, 3 children.  Not currently working, husband travels a lot for work.           OBJECTIVE: Ht _0  (1.778 m) Wt 290 lb (131.543 kg) Body mass index is 41.61 kg/m. BP 160/90    Lindsey [name] 76    Ht _1  (1.778 m)    Wt (!) 290 lb (131.5 kg)    BMI 41.61 kg/m    GEN:  Well developed, well nourished female in no acute distress, sitting comfortably in the exam room.  HEENT:  AT/NC.  Pupils equal, round and reactive to light.  Extra occular muscles intact.  NEURO:  Alert and Oriented times three.  DTR's equal  and symmetric.  Affect is normal.      PHQ-9:  Over the last 2 weeks how often have you been bothered by any of the following problems?  1.  Little interest or pleasure in doing things: Not at all  2. Feeling down, depressed or hopeless: Not at all  3. Trouble falling or staying asleep, or sleeping too much: (!) More than [name] the days  4. Feeling tired or having little energy : Not at all  5. Poor appetite or overeating: (!) Nearly every day  6. Feeling bad about yourself - or that you are a failure or have let yourself or your family down: Several days  7. Trouble concentrating on things, such as reading the newspaper or watching television: Not at all  8. Moving or speaking much more slowly than usual.  Or the opposite - fidgety or restless: Not at all  9. Thoughts that you would be better off dead or of hurting yourself in some way: Not at all    Total  PHQ9 Total Score: 6    10. 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  A signed contract is now placed in the chart.  She'll continue on her 1-2 daily hydrocodone.  I've also encouraged her to increase her Tylenol up to 01-2999 mg per day.  We discussed having her go back to see Dr. Guido Sander since the rhizotomy procedure was not authorized.  Perhaps she would be a  candidate of a corticosteroid injection or repeating the medial branch blocks.    Major depressive disorder: She is doing well.  She will now continue with bupropion SR 150 mg once daily plus venlafaxine.  She will use alprazolam as needed.    Right facial pain: She was referred to Dr. Manuella Ghazi for evaluation.  She may need advanced imaging.    Essential hypertension: We repeated her blood pressure 3 times a day.  She is not equal.  I did increase her enalapril 10 mg.  She will send me her blood pressures in 2 weeks.  We may need to adjust her medications from there.    Chronic seasonal allergies: I did refill her Flonase.    Chronic gout: I refilled her allopurinol.    F/U:  4-6 weeks for physical with labs      Angeleigh Chiasson D. Jonel Weldon, MD  [facility]  [address] [place] [address]  T: [phone]  F: [phone]      This document was generated in part using voice recognition technology.  Although every effort was made to insure accuracy, transcription errors may occur.

## 2017-03-30 NOTE — Patient Instructions (Addendum)
Take up to 3000mg  of acetaminophen daily as needed      Send daily blood pressures in about 2 weeks.    [facility] Controlled Substances Treatment Agreement    Controlled substances can be dangerous. If they are not used carefully, you can become addicted to them or overdose on them. An overdose can cause death. Because of these dangers, it is important for you to understand the rules for using these medicines. This document describes our policy for prescribing these medicines and what your role is to keep yourself safe and get the best results if you use controlled substances.    Check Box  [x]  The risks, side effects, and benefits of my controlled substance medicines have been explained to me.  [x]  I understand that the medicines must help me function better. If my activity level or general function get worse, my provider will change or stop the medicines.  [x]  I understand medicines are only part of an effective treatment plan for me. I will also participate in other treatments that my provider recommends, such as behavioral health and physical therapy.  [x]  I will take my controlled substance medicines only the way my provider told me to. I will not change how I take these medicines without first talking to my provider.   [x]  I will keep my controlled substance medicines in a safe place AND away from children.  [x]  I will get my controlled substance medicines only from my provider in [facility] (clinic) and  at____[facility]___________ (pharmacy).  [x]  I will tell other health care provider(s) I see that I am taking controlled substance medicines.  [x]  I will not get controlled substances from other clinics or Emergency Rooms. If I get controlled substances from another provider for other reasons I will tell my provider here.  [x]  I will make follow-up appointments as directed and will not miss appointments. I understand that prescription refills cannot be handled over  the phone.  [x]  I will not ask for extra or early refills if I run out early for any reason, or if my controlled substance medicines are lost or stolen.   [x]  I will not abuse (drink too much) alcohol, use illegal drugs (cocaine, heroin, methamphetamines) or use any controlled substances my provider did not prescribe for me.  [x]  I will not share, sell, or trade my controlled substance medicines with anyone.   [x]  I will allow my urine or blood to be checked to see what drugs I am taking at any time.  [x]  I agree to bring my medicines to clinic if my provider asks me to.  [x]  I understand that if there is reason to believe I have engaged in illegal activity, my provider may notify the proper authorities.  [x]  I agree that my provider may contact other health care providers or pharmacists involved in my care to discuss my progress and share information about this agreement.    I understand that if I do not follow the agreement above, I may no longer receive controlled substance medicines.     PATIENT SIGNATURE     PATIENT NAME /  MRN                                            Lindsey [name]  [id number]   DATE                  03/30/2017   PROVIDER SIGNATURE      PROVIDER NAME               J. Ashelyn Mccravy DATE           03/30/2017 TIME PAGER  NPI

## 2017-04-29 ENCOUNTER — Encounter (INDEPENDENT_AMBULATORY_CARE_PROVIDER_SITE_OTHER): Payer: 59 | Admitting: Orthopaedic Surgery

## 2017-05-10 ENCOUNTER — Ambulatory Visit: Payer: 59 | Attending: Otolaryngology

## 2017-05-10 DIAGNOSIS — A501 Early congenital syphilis, latent: Secondary | ICD-10-CM | POA: Insufficient documentation

## 2017-05-18 ENCOUNTER — Encounter (INDEPENDENT_AMBULATORY_CARE_PROVIDER_SITE_OTHER): Payer: Self-pay | Admitting: Family Medicine

## 2017-05-18 ENCOUNTER — Ambulatory Visit (INDEPENDENT_AMBULATORY_CARE_PROVIDER_SITE_OTHER): Payer: 59 | Admitting: Orthopaedic Surgery

## 2017-05-18 ENCOUNTER — Encounter (INDEPENDENT_AMBULATORY_CARE_PROVIDER_SITE_OTHER): Payer: Self-pay | Admitting: Orthopaedic Surgery

## 2017-05-18 VITALS — BP 138/87 | HR 70

## 2017-05-18 DIAGNOSIS — G8929 Other chronic pain: Secondary | ICD-10-CM

## 2017-05-18 DIAGNOSIS — M545 Low back pain, unspecified: Secondary | ICD-10-CM

## 2017-05-18 DIAGNOSIS — M47816 Spondylosis without myelopathy or radiculopathy, lumbar region: Secondary | ICD-10-CM

## 2017-05-18 NOTE — Progress Notes (Signed)
Here to discuss:bilateral low back pain  Last seen: 11/07/15    Reports she never had the RFA because it was denied.   She had surgery for kidney cancer. That was resolved. 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     She is seeing Dr. Marylin Crosby for her knee pain. She knows she needs to loose weight before surgery.     She gained 60lbs after quitting smoking. She stress eats.     Pain Level 7-8/10 aggravated with walking, alleviated with sitting.     Treatments include:   -Medication: hydrocodone, tylenol, trazodone prn for sleep.   -Injections:   09/17/2015 Bilateral L4-L5, L5-S1 MBB     -Therapies: PT at [facility] in [facility].    -Surgery: 7 level fusion in her thoracic spine 2005   -Imaging: MRI L Spine 08/23/15

## 2017-05-18 NOTE — Progress Notes (Signed)
05/18/2017    Lindsey [name]  [id number]    IDENTIFYING DATA:  Lindsey [name] is a pleasant 66 year old female who was last seen on 11/07/2015. She returns today for reevaluation.    INTERIM HISTORY:  Lindsey [name] returns to discuss her axial low back pain.  After initial diagnostic medial branch blocks back in 2016, she was referred for RFA, but had not completed therapy that was documented and therefore the ablation was not authorized.  She was frustrated by this especially as was towards end of the period however she did work with physical therapy and also had physician directed exercises both by [place] and her primary care provider which she also did.  This is for greater than 6-8 weeks with some benefit but persistent axial back pain with standing twisting as before.  She denies any referral into the legs.  She has been dealing with unrelated medical issues more recently which have limited her abilities to do some exercises and also seek further treatment, but she is now rated 2 address her back again which is limiting factor in her ability to lose weight and exercise.  Symptoms of the same.  Axial pointing towards the lumbosacral junction without any referral into the legs.  No new injuries.  No new weakness and numbness.  Pain varies rated up to 7-8/10.  2 really does feel better with sitting worse walking and standing and twisting as mentioned, but she does need to move which is bothersome.  Has been using pain medications and Tylenol which is something she would rather avoid.    PMH/PSH/MEDICATIONS/FAMILY MEDICAL HISTORY/SOCIAL HISTORY:  See chart. Pertient changes described above.     MEDICATIONS/ALLERGIES:  See chart. No changes.     REVIEW OF SYSTEMS:  14 point review of systems negative other than mentioned above.    PHYSICAL EXAMINATION:  General: Awake, alert, and oriented, no apparent distress, pleasant, and cooperative  Vitals: Blood pressure 138/87, pulse 70.There is no height or weight on file to  calculate BMI.  Neurologic/Musculoskeletal: Gait non-antalgic.  On standing she does have slight forward flexed posture, aggravation of back pain today with extension and side bending rotation with facet loading to both sides and palpation over the posterior elements in the lower lumbar spine all this reproduces symptoms.  There is no referral distally today with provocative testing and neural tension signs are negative.  Some mild pain inhibition proximally but no new weakness or numbness in her legs unless exam.    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 We discussed her ongoing symptoms and response to previous to diagnostic medial branch blocks in the lower lumbar spine which took her pain wait at least 80% for a short period of time as expected.  2. She has also tried physical therapy as well as physician directed exercise program for a minimum of 6 weeks without any significant long-term benefit.  She also currently continues using some medications with some benefit but axial pain limits her ability to move and work and also lose weight.  3. At this point she is interested in, and would be a candidate for radiofrequency ablation/rhizotomy for lower lumbar facet joints at L4-5 and L5-S1 bilaterally.  We will therefore resubmit referral and request for consultation to have the procedure done.  She would rather have this done  with conscious sedation if she can, and referral will be sent to via radiology.  4. In the meantime we did encourage her to continue with some strengthening and stabilizing exercises including low impact cardiovascular exercises which she will try to do.  5. Follow-up with me as needed.  Call with any questions or concerns      Greater than 15 minutes were spent with the patient in clinic today, with more than 50% of the time  face-to-face in consultation, education, and coordination of care.        Loy Little B.HGuido Sander, D.O.   Board certified - Physical Medicine & Rehabilitation      Portions of this document were created with voice-recognition software and may contain occasional errant or misspelled words.

## 2017-05-19 ENCOUNTER — Ambulatory Visit: Payer: 59 | Attending: Family Medicine

## 2017-05-19 ENCOUNTER — Encounter (INDEPENDENT_AMBULATORY_CARE_PROVIDER_SITE_OTHER): Payer: Self-pay | Admitting: Family Medicine

## 2017-05-19 ENCOUNTER — Ambulatory Visit (INDEPENDENT_AMBULATORY_CARE_PROVIDER_SITE_OTHER): Payer: 59 | Admitting: Family Medicine

## 2017-05-19 VITALS — BP 134/81 | HR 79 | Ht 70.0 in | Wt 295.0 lb

## 2017-05-19 DIAGNOSIS — Z1329 Encounter for screening for other suspected endocrine disorder: Secondary | ICD-10-CM

## 2017-05-19 DIAGNOSIS — Z131 Encounter for screening for diabetes mellitus: Secondary | ICD-10-CM | POA: Insufficient documentation

## 2017-05-19 DIAGNOSIS — Z13820 Encounter for screening for osteoporosis: Secondary | ICD-10-CM

## 2017-05-19 DIAGNOSIS — Z130 Encounter for screening for diseases of the blood and blood-forming organs and certain disorders involving the immune mechanism: Secondary | ICD-10-CM

## 2017-05-19 DIAGNOSIS — F330 Major depressive disorder, recurrent, mild: Secondary | ICD-10-CM

## 2017-05-19 DIAGNOSIS — M1812 Unilateral primary osteoarthritis of first carpometacarpal joint, left hand: Secondary | ICD-10-CM

## 2017-05-19 DIAGNOSIS — E782 Mixed hyperlipidemia: Secondary | ICD-10-CM

## 2017-05-19 DIAGNOSIS — M1A00X0 Idiopathic chronic gout, unspecified site, without tophus (tophi): Secondary | ICD-10-CM

## 2017-05-19 DIAGNOSIS — G4733 Obstructive sleep apnea (adult) (pediatric): Secondary | ICD-10-CM

## 2017-05-19 DIAGNOSIS — M170 Bilateral primary osteoarthritis of knee: Secondary | ICD-10-CM

## 2017-05-19 DIAGNOSIS — Z0000 Encounter for general adult medical examination without abnormal findings: Secondary | ICD-10-CM

## 2017-05-19 DIAGNOSIS — I10 Essential (primary) hypertension: Secondary | ICD-10-CM | POA: Insufficient documentation

## 2017-05-19 DIAGNOSIS — M47816 Spondylosis without myelopathy or radiculopathy, lumbar region: Secondary | ICD-10-CM

## 2017-05-19 DIAGNOSIS — Z23 Encounter for immunization: Secondary | ICD-10-CM

## 2017-05-19 DIAGNOSIS — Z6841 Body Mass Index (BMI) 40.0 and over, adult: Secondary | ICD-10-CM

## 2017-05-19 LAB — THYROID STIMULATING HORMONE: Thyroid Stimulating Hormone: 2.829 u[IU]/mL (ref 0.400–5.000)

## 2017-05-19 LAB — COMPREHENSIVE METABOLIC PANEL
ALT (GPT): 20 U/L (ref 7–33)
AST (GOT): 18 U/L (ref 9–38)
Albumin: 4.2 g/dL (ref 3.5–5.2)
Alkaline Phosphatase (Total): 99 U/L (ref 38–172)
Anion Gap: 9 (ref 4–12)
Bilirubin (Total): 0.5 mg/dL (ref 0.2–1.3)
Calcium: 9.8 mg/dL (ref 8.9–10.2)
Carbon Dioxide, Total: 28 meq/L (ref 22–32)
Chloride: 103 meq/L (ref 98–108)
Creatinine: 1.15 mg/dL — ABNORMAL HIGH (ref 0.38–1.02)
GFR, Calc, African American: 57 mL/min/{1.73_m2} — ABNORMAL LOW (ref >59)
GFR, Calc, European American: 47 mL/min/{1.73_m2} — ABNORMAL LOW (ref >59)
Glucose: 125 mg/dL (ref 62–125)
Potassium: 4.9 meq/L (ref 3.6–5.2)
Protein (Total): 7 g/dL (ref 6.0–8.2)
Sodium: 140 meq/L (ref 135–145)
Urea Nitrogen: 19 mg/dL (ref 8–21)

## 2017-05-19 LAB — LIPID PANEL
Cholesterol (LDL): 110 mg/dL (ref <130)
Cholesterol/HDL Ratio: 3.6
HDL Cholesterol: 57 mg/dL (ref >39)
Non-HDL Cholesterol: 150 mg/dL (ref 0–159)
Total Cholesterol: 207 mg/dL — ABNORMAL HIGH (ref <200)
Triglyceride: 202 mg/dL — ABNORMAL HIGH (ref <150)

## 2017-05-19 LAB — CBC, DIFF
% Basophils: 1 %
% Eosinophils: 3 %
% Immature Granulocytes: 0 %
% Lymphocytes: 28 %
% Monocytes: 8 %
% Neutrophils: 60 %
% Nucleated RBC: 0 %
Absolute Eosinophil Count: 0.18 10*3/uL (ref 0.00–0.50)
Absolute Lymphocyte Count: 1.61 10*3/uL (ref 1.00–4.80)
Basophils: 0.06 10*3/uL (ref 0.00–0.20)
Hematocrit: 41 % (ref 36–45)
Hemoglobin: 12.6 g/dL (ref 11.5–15.5)
Immature Granulocytes: 0.01 10*3/uL (ref 0.00–0.05)
MCH: 28.8 pg (ref 27.3–33.6)
MCHC: 31 g/dL — ABNORMAL LOW (ref 32.2–36.5)
MCV: 93 fL (ref 81–98)
Monocytes: 0.47 10*3/uL (ref 0.00–0.80)
Neutrophils: 3.5 10*3/uL (ref 1.80–7.00)
Nucleated RBC: 0 10*3/uL
Platelet Count: 334 10*3/uL (ref 150–400)
RBC: 4.38 10*6/uL (ref 3.80–5.00)
RDW-CV: 14.2 % (ref 11.6–14.4)
WBC: 5.83 10*3/uL (ref 4.3–10.0)

## 2017-05-19 LAB — URIC ACID, SERUM: Uric Acid: 7.1 mg/dL — ABNORMAL HIGH (ref 2.6–6.8)

## 2017-05-19 LAB — T4, FREE: Thyroxine (Free): 0.7 ng/dL (ref 0.6–1.2)

## 2017-05-19 LAB — BILIRUBIN (DIRECT): Bilirubin (Direct): 0.1 mg/dL (ref 0.0–0.3)

## 2017-05-19 MED ORDER — HYDROCODONE-ACETAMINOPHEN 5-325 MG OR TABS
1.0000 | ORAL_TABLET | Freq: Four times a day (QID) | ORAL | 0 refills | Status: DC | PRN
Start: 2017-05-19 — End: 2017-07-12

## 2017-05-19 MED ORDER — DICLOFENAC SODIUM 1 % TD GEL
2.0000 g | Freq: Four times a day (QID) | TRANSDERMAL | 2 refills | Status: DC | PRN
Start: 2017-05-19 — End: 2017-10-12

## 2017-05-19 NOTE — Telephone Encounter (Signed)
I called the pharmacy and spoke with Levada Dy, who states that they did not receive the e-Rx for pt's diclofenac gel. I read the order over the phone and the pharmacy will fill the Rx.    [place] [address] [phone] [place] [id number]

## 2017-05-19 NOTE — Progress Notes (Signed)
PRE-VACCINATION SAFETY QUESTIONNAIRE    Are you sick today?NO    Have you ever had a serious reaction after receiving any vaccination?  NO    Do you have allergies to medications, food, a vaccine component, or latex?  YES - latex    Do you have a long-term health problem with heart disease, lung disease, asthma, kidney disease, metabolic disease(e.g., diabetes), anemia, or other blood disorder?   NO    Do you have cancer, leukemia, HIV/AIDS, or any other immune system problem?  NO    In the past 3 months, have you taken medications that weaken your immune system, such as cortisone, prednisone, other steroids, or anticancer drugs, or have you had radiation treatments?  NO    For Women:  Are you pregnant or are you considering becoming pregnant?  NO    Have you had a seizure, or a brain or other nervous system problem?  NO    During the past year, have you received a transfusion of blood or blood products, or been given immune (gamma)globulin or an antiviral drug?  NO    Have you received any vaccinations in the past 4 weeks?  NO    Vaccine information sheet(s) discussed, patient/parent/guardian verbalized understanding? YES     VIS given 05/19/17, by Linton Flemings, CMA

## 2017-05-19 NOTE — Progress Notes (Addendum)
Lindsey [name] is a 66 year old female here today for a preventive health visit. Other problems or concerns today:     1.  Arthritis:  She feels her arthritis is particularly sensitive to changes in barometric pressures.  She notices it particularly in her left thumb, knees and back.    GYN: Dr. Debbrah Alar    CANCER SCREENING  Family history of colon cancer: NO  Family history of uterine or ovarian cancer: NO  Family history of breast cancer: NO  Prior mammogram: YES 11/2016  History of abnormal mammogram: NO    LIFESTYLE  Current dietary habits: meals per day on average: 3 plus snacks, she tends to eat late at night (snacks)  Calcium: calcium supplements:  YES  Current exercise habits: gardening and housecleaning  Regular seat belt use: YES  Substance use:  reports that she quit smoking about 6 years ago. Her smoking use included Cigarettes. She has a 40.00 pack-year smoking history. She has never used smokeless tobacco. She reports that she drinks alcohol. She reports that she does not use drugs.   Exposure to hazardous materials: No  Guns in the house: Yes: Stored in cases    History sections of chart reviewed and updated today: YES    Review Of Systems:  Constitutional: Denies, fatigue, fever, chills, night sweats.  Regular dental exams: yes  Eye: Denies, blurry vision, diplopia, eye pain  ENT: Denies, hearing difficulties, nasal congestion and any ear nose or throat problems  Respiratory: Denies cough, wheezing.  She has had some SOB.  Cardiovascular: Denies, chest pain or pressure at rest or during exercise, palpitations,   GI: Denies, change in appetite, dysphagia, nausea, vomiting, dyspepsia, abdominal pain, diarrhea, constipation, hematochezia, melena  GU: Denies, dysuria, hematuria, urinary incontinence, urinary urgency, vaginal discharge, abnormal vaginal bleeding, pelvic pain  Derm: Denies, skin rash, easy bruising, hair loss, changes in moles or new moles  Rheumatologic/Musculoskeletal: Does have  joint and arthritic problems, arthralgias (back and knee), joint stiffness (knee and back)  Endocrine: Denies, polyuria, polydipsia, heat intolerance, cold intolerance, prior blood sugar problems, prior thyroid problems  Neurologic: Denies, headaches and tremors  Psych: Denies, depressed mood, and anxiety       EXAM:  Blood pressure 134/81, pulse 79, height 5\' 10"  (1.778 m), weight (!) 295 lb (133.8 kg).    General: healthy, alert, no distress, cooperative  Skin: Skin color, texture, turgor normal. No rashes or concerning lesions  Head: Normocephalic. No masses, lesions, tenderness or abnormalities  Eyes: Lids/periorbital skin normal, Conjunctivae/corneas clear, PERRL, EOM's intact  Neck: supple. No adenopathy. Thyroid symmetric, normal size, without nodules  Lungs: clear to auscultation  Heart: normal rate, regular rhythm and no murmurs, clicks, or gallops  Breasts: not done: discussed recommendations for age, patient does BSE, patient declined  Abd: soft, non-tender. BS normal. No masses or organomegaly  GU: deferred  Rectal: deferred  Extremities: Normal, without deformities, edema, or skin discoloration, radial and DP pulses 2+ bilaterally.  She has tenderness to palpation over the left [employer] joint  Neuro:  Grossly normal to observation, gait normal    PHQ-2:  Over the last 2 weeks how often have you been bothered by any of the following problems?  1. Little interest or pleasure in doing things: Not at all  2. Feeling down, depressed or hopeless: Not at all      ASSESSMENT:    66 yo female with:  Patient Active Problem List   Diagnosis    Palpitations  Hypertension    Hyperlipidemia    Gout    Former smoker    Sleep apnea    Shortness of breath    Basal cell carcinoma    Squamous cell carcinoma    Depression, major    Lumbar degenerative disc disease    Fracture, radius, distal    Anxiety    Cellulitis of face    Morbid obesity (HCC)    Osteoarthritis of both knees    Spondylosis of lumbar  region without myelopathy or radiculopathy    Chronic bilateral low back pain without sciatica    Lumbar facet joint pain    History of back surgery    Other specified health status    Coronary artery calcification seen on CT scan    H/O acute pancreatitis    History of biliary stent insertion    Controlled substance agreement signed         PLAN:    HCM:  Check labs today including CBC, CMP, TSH, free T4, uric acid, lipids, hemoglobin A1c.  She is up-to-date on her colonoscopy, mammogram.  She needs her Prevnar.  She previously had a local site reaction to pneumovax.  She was warned about a similar response.  She elected to proceed.  Lung CA screening has been done through [place].  She will follow up for her annual screen.    Hypertension: Stable on triamterene/HCTZ and enalapril.  Continue on these medications.  She needs to increase he activity if possible.    Hyperlipidemia: Recheck lipids today.  She will continue her Simvastatin.    Gout: Check uric acid.  Continue allopurinol.    Major Depression/anxiety: She will continue on the venlafaxine, bupropion and occasional alprazolam.    Sleep apnea:  She is on CPAP.  She will continue this now.    Morbid Obesity: She is trying to eat less salt.      Left sided RCC s/p partial nephrectomy:  Will check a follow up CMP today.    Spondylosis lumbar spine/osteoarthritis bilateral knees:  She is continuing to take hydrocodone/acetaminophen.  As she cannot take the NSAIDs due to some concern of renal insults, she is requiring more of the opioids.  She is using this intermittently.  This was re-filled today #60.      Left thumb CMC arthritis:  She was prescribed diclofenac gel 1% to use as needed for her CMC arthritis    F/U 1 year.    Health Maintenance   Topic Date Due    Osteoporosis Screening  06/06/2016    Pneumococcal Vaccine (1 of 2 - PCV13) 06/06/2016    Lung Cancer Screening  06/11/2016    Depression Monitoring (PHQ-9)  03/30/2018    Breast Cancer  Screening  12/17/2018    Colorectal Cancer Screening (Colonoscopy)  12/05/2019    Diabetes Screening  12/05/2019    Tetanus Vaccine  05/05/2021    Lipid Disorders Screening  05/15/2021    Zoster Vaccine  Completed    Influenza Vaccine  Completed    Hepatitis C Screening  Addressed    HIV Screening  Addressed    Cervical Cancer Screening (Pap Smear)  Excluded     Immunizations due: PREVNAR  Screening tests discussed: Diabetes (FBS if BP > 135/80) and Lipids  History sections of chart reviewed and updated today: YES    No diagnosis found.    The following were reviewed with patient:  Skin cancer prevention and self-exam and Vitamin D  Patient information given  for the following: none    Follow up: 1 year  Lab tests and results of any diagnostic studies will be shared with the patient by  [facility]

## 2017-05-19 NOTE — Patient Instructions (Signed)
Shingles shot at pharmacy ([facility])

## 2017-05-20 ENCOUNTER — Telehealth (INDEPENDENT_AMBULATORY_CARE_PROVIDER_SITE_OTHER): Payer: Self-pay | Admitting: Family Medicine

## 2017-05-20 LAB — HEMOGLOBIN A1C, HPLC: Hemoglobin A1C: 6.4 % — ABNORMAL HIGH (ref 4.0–6.0)

## 2017-05-20 NOTE — Telephone Encounter (Signed)
I called and spoke with pt to see how she is feeling today following her injection yesterday. She states that she is feeling fine today. She is eating normally without any nausea or vomiting.    I also told her that her insurance company is requiring a Prior Authorization for her diclofenac gel, and that we are working through that process.     She is wondering if Dr Rothmier will be contacting her regarding her lab tests run yesterday.

## 2017-06-18 ENCOUNTER — Ambulatory Visit: Payer: 59 | Attending: Otolaryngology

## 2017-06-18 DIAGNOSIS — G501 Atypical facial pain: Secondary | ICD-10-CM | POA: Insufficient documentation

## 2017-06-19 ENCOUNTER — Other Ambulatory Visit (INDEPENDENT_AMBULATORY_CARE_PROVIDER_SITE_OTHER): Payer: Self-pay | Admitting: Family Medicine

## 2017-06-19 DIAGNOSIS — I10 Essential (primary) hypertension: Secondary | ICD-10-CM

## 2017-06-21 MED ORDER — ENALAPRIL MALEATE 5 MG OR TABS
ORAL_TABLET | ORAL | 3 refills | Status: DC
Start: 2017-06-21 — End: 2017-10-12

## 2017-06-21 NOTE — Telephone Encounter (Signed)
Medication Requested: Enalapril Maleate 5 MG Oral Tab, by pharmacy  Last seen:  05/19/2017 for cpe  Last seen for diagnosis pertinent to refill: 05/19/2017  Last labs (as applicable): n/a  Last fill (as applicable): 34/35/6861  Follow up due: 04/2018   Does patient need to be contacted?  NO

## 2017-06-22 ENCOUNTER — Ambulatory Visit (INDEPENDENT_AMBULATORY_CARE_PROVIDER_SITE_OTHER): Payer: Self-pay

## 2017-06-22 ENCOUNTER — Encounter (INDEPENDENT_AMBULATORY_CARE_PROVIDER_SITE_OTHER): Payer: Self-pay | Admitting: Family Medicine

## 2017-06-24 ENCOUNTER — Ambulatory Visit (INDEPENDENT_AMBULATORY_CARE_PROVIDER_SITE_OTHER): Payer: 59

## 2017-06-29 ENCOUNTER — Ambulatory Visit: Payer: 59 | Attending: Family Medicine

## 2017-06-29 DIAGNOSIS — Z13820 Encounter for screening for osteoporosis: Secondary | ICD-10-CM | POA: Insufficient documentation

## 2017-06-29 LAB — HM DEXA SCAN: HM DEXA SCAN: NORMAL

## 2017-07-12 ENCOUNTER — Encounter (INDEPENDENT_AMBULATORY_CARE_PROVIDER_SITE_OTHER): Payer: Self-pay | Admitting: Family Medicine

## 2017-07-12 ENCOUNTER — Other Ambulatory Visit (INDEPENDENT_AMBULATORY_CARE_PROVIDER_SITE_OTHER): Payer: Self-pay | Admitting: Family Medicine

## 2017-07-12 DIAGNOSIS — M47816 Spondylosis without myelopathy or radiculopathy, lumbar region: Secondary | ICD-10-CM

## 2017-07-12 MED ORDER — HYDROCODONE-ACETAMINOPHEN 5-325 MG OR TABS
1.0000 | ORAL_TABLET | Freq: Four times a day (QID) | ORAL | 0 refills | Status: DC | PRN
Start: 2017-07-12 — End: 2017-10-07

## 2017-07-12 NOTE — Telephone Encounter (Signed)
Medication refilled.  Script given to [employer], patient's husband.

## 2017-07-12 NOTE — Telephone Encounter (Signed)
Medication Requested: Norco 5/325, by patient's husband  Last seen:  05/19/17 for preventive/wellness exam  Last seen for diagnosis pertinent to refill: 05/19/17  Last labs (as applicable): 1/50/56  Last fill (as applicable): 9/79/48  Follow up due: 2-3 months for pain   Does patient need to be contacted?  NO    patient's husband will be taking Rx home with him to give to patient (he is here in office today)

## 2017-07-29 ENCOUNTER — Encounter (INDEPENDENT_AMBULATORY_CARE_PROVIDER_SITE_OTHER): Payer: Self-pay | Admitting: Family Medicine

## 2017-08-02 ENCOUNTER — Ambulatory Visit (INDEPENDENT_AMBULATORY_CARE_PROVIDER_SITE_OTHER): Payer: 59

## 2017-08-10 ENCOUNTER — Telehealth (INDEPENDENT_AMBULATORY_CARE_PROVIDER_SITE_OTHER): Payer: Self-pay | Admitting: Neurology

## 2017-08-10 NOTE — Telephone Encounter (Signed)
(  TEXTING IS AN OPTION FOR [facility] ONLY)  Is this a [facility]? No      RETURN CALL: Detailed message on voicemail only      SUBJECT:  Appointment Request     REASON FOR REQUEST/SYMPTOMS: Nerve problem in face  REFERRING PROVIDER: Dr Smitty Pluck APPOINTMENT WITH: Any  REQUESTED DATE: As soon as possible, TIME: Call to discuss  UNABLE TO APPOINT BECAUSE: No referral in Epic yet. Patient states was sent yesterday, please callback to schedule thanks.

## 2017-08-10 NOTE — Telephone Encounter (Signed)
scheduled

## 2017-08-12 ENCOUNTER — Ambulatory Visit (INDEPENDENT_AMBULATORY_CARE_PROVIDER_SITE_OTHER): Payer: 59 | Admitting: Audiologist

## 2017-08-12 ENCOUNTER — Encounter (INDEPENDENT_AMBULATORY_CARE_PROVIDER_SITE_OTHER): Payer: Self-pay | Admitting: Audiologist

## 2017-08-12 DIAGNOSIS — H93299 Other abnormal auditory perceptions, unspecified ear: Secondary | ICD-10-CM

## 2017-08-12 NOTE — Progress Notes (Signed)
Lindsey [name] is a 47 yrs female referred by Dr. Manuella Ghazi who was seen for a hearing evaluation.       Noise Exposure: Denies        [place]    Otoscopic Inspection:     Right Ear: Clear EAC, visualized TM    Left Ear: Clear EAC, visualized TM    Tympanometry: See Scanned results in EPIC media tab    Right Ear: Normal peak pressure and compliance    Left Ear: Normal peak pressure and compliance    Audiometry: See Scanned results in EPIC media tab    Right Ear: Hearing is within normal limits 250-8000Hz     Left Ear: Hearing is within normal limits 250-8000Hz     Speech Discrimination (Word Recognition): See Scanned results in EPIC media tab    Right Ear:  100% words correct at 50 dBHL.    Left Ear:  100% words correct at  50 dBHL      PLAN      ICD-10-CM ICD-9-CM    1. Abnormal auditory perception, unspecified H93.299 388.40 COMPRE AUDIOMETRY THRESHOLD EVAL SP RECOGNIJ      TYMPANOMETRY AND REFLEX THRESHOLD MEASUREMENTS       Patient to follow up with Dr. Purvis Kilts Modena Slater, AUD

## 2017-08-18 ENCOUNTER — Encounter (INDEPENDENT_AMBULATORY_CARE_PROVIDER_SITE_OTHER): Payer: Self-pay | Admitting: Family Medicine

## 2017-08-31 ENCOUNTER — Ambulatory Visit (INDEPENDENT_AMBULATORY_CARE_PROVIDER_SITE_OTHER): Payer: 59 | Admitting: Neurology

## 2017-08-31 ENCOUNTER — Encounter (INDEPENDENT_AMBULATORY_CARE_PROVIDER_SITE_OTHER): Payer: Self-pay | Admitting: Neurology

## 2017-08-31 VITALS — Ht 70.0 in | Wt 295.0 lb

## 2017-08-31 DIAGNOSIS — G518 Other disorders of facial nerve: Secondary | ICD-10-CM

## 2017-08-31 DIAGNOSIS — Z6841 Body Mass Index (BMI) 40.0 and over, adult: Secondary | ICD-10-CM

## 2017-08-31 NOTE — Progress Notes (Signed)
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    Facial pain started with dental problems. Had a root canal and crown placed in 11/2015 that was very painful when the crown was placed Lindsey [name] the tooth. Has had previous crowns that were not painful in this way. Her pain is in the right-sided maxillary area comes if pressure is applied to the crown, but otherwise does not occur. Feels discomfort when she places her tongue Lindsey [name] the tooth, or when she brushes it. Her pain can then last 1-2 hours. Has had a history of very poor dentition.    No facial numbness. No pain in the lower jaw. No pain with chewing on her left side. No double vision. No trouble swallowing.Had her hearing tested 1 month ago--reportedly normal. No pain or numbness anywhere else in her body. Not taking any medication for her pain. No birth marks. No pain shooting through her face during episodes. Pain is not triggered by wind or other tactile stimuli of the face.    Endodontist reportedly advised her not to pull tooth in case her pain is to related trigeminal neuralgia.     Father had trigeminal neuralgia, history of brain tumor on right-sided of head above ear (recurrent and associated with monaural deafness) and Alzheimer's disease.  Paternal grandmother may also have had monaural deafness due to a tumor.  No family history of neurofibromatosis.  Sisters had alcoholism--1 is deceased. Children with alcoholism and opioid addiction.     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 45 year history of smoking cigarettes--stopped 5 years ago and gained 80 lb. Father was former Chief Strategy Officer.    REVIEW OF SYSTEMS:    General   Headache: No   Wt Change: Yes   Fever: No   Fatigue: No   Appetite Change: No  Neurologic:  See HPI   Eyes   Eye Disease  Yes  ENT   Hearing Problems: No   Sinus Problems: No   Mouth/throat sores: No   CV   Chest Pain: No   Palpitations: Yes  Respiratory   Cough: No   Dyspnea: No  GI   Bowel changes: No  GU   Urinating difficulty: No  M/S  Arthralgias/Stiffness/Swelling: Yes   Back Pain Yes    Neck pain No Skin   Rash/skin changes: No  Psych   Memory problems: No   Concentration problems: No   Depression: Yes   Anxiety   Sleep problems: Yes  Endocrine   Polyuria/polydipsia: No  Hematological   Bleeding/bruising: No  Lymphatic    Adenopathy:No     PHYSICAL EXAM:  Constitutional:  This is a well-developed, pleasant female in no acute distress.  Vitals:    08/31/17 1412   BP Site: Left Arm   BP Position: Sitting   Weight: (!) 295 lb (133.8 kg)   Height: 5\' 10"  (1.778 m)     Mental Status:  Alert and oriented, with intact attention, concentration, memory, language and fund of knowledge.   Cranial Nerves: Fundoscopy is unremarkable.  Visual fields are full.  Oculomotor intact.    No facial sensory loss.Corneal reflexes are absent. Nasal tickle symmetrical.  Normal touch, pain, temperature sensation.  Normal pterygoid and maseter power.  No facial weakness.  No hearing loss.  No weakness of the sternomastoid, trapeziius, palate or tongue.  No dysarthria.  Motor:  Right upper extremity drift.  There are no abnormalities of tone, bulk or power.  Trace ankle reflex on the right, absent on the left.  Otherwise, reflexes are symmetrical.  Plantar responses are flexor.  There is no incoordination in the limbs.  Gait is symmetrical with normal walking on heels and toes.  There is no postural instability.  Sensory:  Intact for touch, pain, temperature and vibration sensation in the face.  HEENT:  No carotid bruit.   Chest:  Clear.  Cardiovascular:  No murmur. Regular rate and rhythm.   Abdomen:  Soft, non-tender.  Extremities:  No edema.    Lipid panel (05/19/2017)--Cholesterol = 207, Triglyceride = 202.  Hemoglobin A1C (05/19/2017) = 6.4.  MRI head  with and without contrast (05/10/2017)--unremarkable. No enhancement of trigeminal nerve. No tortuous vessels impinging on the nerve.     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      It is the only place that her pain appears to come from.  Furthermore, there is no evidence of trigeminal nerve asymmetry or impinging vessels that could result in trigeminal neuralgia.      The exact cause of the pain is not readily determined.  Presumably there has been trauma to the nerve at or near the site of innervation of the painful upper molar.  It may also be significant that she has very long molar roots such that they appear to extend into the maxillary sinus.  Perhaps this led to trigeminal maxillary branch trauma during manipulation of the tooth.    If she has the development of persistent pain following tooth removal, this will need to be treated symptomatically.    Patient Instructions   Most likely your trigeminal nerve is intact EXCEPT for the tooth that is triggering your pain.    In the absence of any other triggers, I suspect that the tooth is the cause of your facial pain.    Therefore it would seem reasonable to remove the tooth.    However, I would first obtain a second opinion from an endodontist.    Return for follow up as needed.            Prior EMR records reviewed as available and clinically relevant. All questions answered. Discharge and follow up instructions were discussed with the patient. The patient fully understands and is in agreement with the plan.    PAST LAB RESULTS:  Office Visit on 05/19/17   1. BILIRUBIN (DIRECT)   Result Value Ref Range    Bilirubin (Direct) 0.1 0.0 - 0.3 mg/dL   2. CBC, DIFF   Result Value Ref Range    WBC 5.83 4.3 - 10.0 10*3/uL    RBC 4.38 3.80 - 5.00 10*6/uL    Hemoglobin 12.6 11.5 - 15.5  g/dL    Hematocrit 41 36 - 45 %    MCV 93 81 - 98 fL    MCH 28.8 27.3 - 33.6 pg    MCHC 31.0 (L) 32.2 - 36.5 g/dL    Platelet Count 334 150 - 400 10*3/uL    RDW-CV 14.2 11.6 - 14.4 %    % Neutrophils 60 %    % Lymphocytes  28 %    % Monocytes 8 %    % Eosinophils 3 %    % Basophils 1 %    % Immature Granulocytes 0 %    Neutrophils 3.50 1.80 - 7.00 10*3/uL    Absolute Lymphocyte Count 1.61 1.00 - 4.80 10*3/uL    Monocytes 0.47 0.00 - 0.80 10*3/uL    Absolute Eosinophil Count 0.18 0.00 - 0.50 10*3/uL    Basophils 0.06 0.00 - 0.20 10*3/uL    Immature Granulocytes 0.01 0.00 - 0.05 10*3/uL    Nucleated RBC 0.00 0.00 10*3/uL    % Nucleated RBC 0 %   3. COMPREHENSIVE METABOLIC PANEL   Result Value Ref Range    Sodium 140 135 - 145 meq/L    Potassium 4.9 3.6 - 5.2 meq/L    Chloride 103 98 - 108 meq/L    Carbon Dioxide, Total 28 22 - 32 meq/L    Anion Gap 9 4 - 12    Glucose 125 62 - 125 mg/dL    Urea Nitrogen 19 8 - 21 mg/dL    Creatinine 1.15 (H) 0.38 - 1.02 mg/dL    Protein (Total) 7.0 6.0 - 8.2 g/dL    Albumin 4.2 3.5 - 5.2 g/dL    Bilirubin (Total) 0.5 0.2 - 1.3 mg/dL    Calcium 9.8 8.9 - 10.2 mg/dL    AST (GOT) 18 9 - 38 U/L    Alkaline Phosphatase (Total) 99 38 - 172 U/L    ALT (GPT) 20 7 - 33 U/L    GFR, Calc, European American 47 (L) >59 mL/min/[1.73_m2]    GFR, Calc, African American 57 (L) >59 mL/min/[1.73_m2]    GFR, Information       Calculated GFR in mL/min/1.73 m2 by MDRD equation.  Inaccurate with changing renal function.  See http://depts.YourCloudFront.fr.html   4. HEMOGLOBIN A1C, HPLC   Result Value Ref Range    Hemoglobin A1C 6.4 (H) 4.0 - 6.0 %   5. LIPID PANEL   Result Value Ref Range    Cholesterol (Total) 207 (H) <200 mg/dL    Triglyceride 202 (H) <150 mg/dL    Cholesterol (HDL) 57 >39 mg/dL    Cholesterol (LDL) 110 <130 mg/dL    Non-HDL Cholesterol 150 0 - 159 mg/dL    Cholesterol/HDL Ratio 3.6     Lipid Panel, Additional Info. (NOTE)    6. T4, FREE   Result Value Ref Range     Thyroxine (Free) 0.7 0.6 - 1.2 ng/dL   7. THYROID STIMULATING HORMONE   Result Value Ref Range    Thyroid Stimulating Hormone 2.829 0.400 - 5.000 u[IU]/mL   8. URIC ACID, SERUM   Result Value Ref Range    Uric Acid 7.1 (H) 2.6 - 6.8 mg/dL         PROBLEM LIST:  Patient Active Problem List   Diagnosis    Palpitations    Essential hypertension    Hyperlipidemia    Gout    Former smoker    Sleep apnea    Shortness of breath    Basal cell carcinoma    Squamous cell carcinoma    Depression, major    Lumbar degenerative disc disease    Fracture, radius, distal    Anxiety    Cellulitis of face    Morbid obesity (HCC)    Osteoarthritis of both knees    Spondylosis of lumbar region without myelopathy or radiculopathy    Chronic bilateral low back pain without sciatica  Lumbar facet joint pain    History of back surgery    Other specified health status    Coronary artery calcification seen on CT scan    H/O acute pancreatitis    History of biliary stent insertion    Controlled substance agreement signed    Arthritis of carpometacarpal (CMC) joint of left thumb    Craniofacial pain syndrome       PAST MEDICAL HISTORY:  Past Medical History:   Diagnosis Date    Anxiety state     Blood transfusion without reported diagnosis     Cancer [facility])     Kidney surgery, 11/2016    Depressive disorder, not elsewhere classified     Disc disorder of lumbosacral region     Disorder of menstrual bleeding     Disorder of muscle, ligament, and fascia     Fracture     Generalized osteoarthrosis, unspecified site     Hearing loss     Concerned about hearing loss AS    Hepatitis     History of back injury     Joint pain     Kidney disease     Leiomyoma of body of uterus 1996    acute severe bleeding- hyst    Lipidemia     Melanoma ([place]) 03/2017    right forearm    Multiparity     adopted sister's child     Osteoporosis     Stomach disorder     Stress fracture     Unspecified essential hypertension         SURGICAL HISTORY:  Past Surgical History:   Procedure Laterality Date    ARM SURGERY  2018    melanoma    BACK SURGERY  2004    thoracic fusion    CHOLECYSTECTOMY  1990's    schincterotomy    MOHS ANY STAGE > 5 SPEC EACH  2012, 2013    SCC R shin, BCC R leg/face    NJX DX/THER AGT PVRT FACET JT LMBR/SAC 1 LEVEL Bilateral 09/17/15    L4-5 Medial Branch Block    NJX DX/THER AGT PVRT FACET JT LMBR/SAC 2ND LEVEL Bilateral 09/17/15    L5-S1 Medial Branch Block    TOTAL ABDOMINAL HYSTERECT W/WO RMVL TUBE OVARY  1998    BSO; fibroids    UNLISTED PROCEDURE LEG/ANKLE  1980's    UNLISTED PROCEDURE SPINE         MEDICATIONS:  Current Outpatient Prescriptions   Medication Sig Dispense Refill    Allopurinol 100 MG Oral Tab Take 1 tablet (100 mg) by mouth daily. 90 tablet 3    ALPRAZolam 0.5 MG Oral Tab TAKE 1/2 TO 1 TABLET BY MOUTH TWICE DAILY AS NEEDED FOR ANXIETY 10 tablet 0    Aspirin 81 MG Oral Tab 1 TABLET DAILY      BuPROPion HCl, SR, 150 MG Oral TABLET SR 12 HR Take 1 tablet (150 mg) by mouth daily. 90 tablet 3    Cholecalciferol (VITAMIN D3) 2000 UNITS Oral Tab Take 1 tablet Daily      Diclofenac Sodium 1 % Transdermal Gel Apply 2 g to affected area on hand(s) 4 times a day as needed. 100 g 2    Enalapril Maleate 10 MG Oral Tab Take 1 tablet (10 mg) by mouth daily. 90 tablet 3    Enalapril Maleate 5 MG Oral Tab TAKE 1 TABLET DAILY 90 tablet 3    Fluticasone Propionate 50 MCG/ACT Nasal Suspension Spray in  each nostril daily 3 bottle 3    Hydrocodone-Acetaminophen 5-325 MG Oral Tab Take 1 tablet by mouth every 6 hours as needed for pain. For SEVERE joint Pain. 60 tablet 0    Multiple Vitamin Oral Tab Take 1 tablet Daily      Omega-3 Fatty Acids (FISH OIL) 1000 MG Oral Cap Take 1 cap Daily      Simvastatin 40 MG Oral Tab Take 0.5 tablets (20 mg) by mouth daily. 45 tablet 3    TraZODone HCl 50 MG Oral Tab Take 1-2 tablets (50-100 mg) by mouth at bedtime as needed for sleep. For sleep 180 tablet 2     Triamterene-HCTZ 37.5-25 MG Oral Tab Take 1 tablet by mouth daily. 90 tablet 3    Venlafaxine HCl ER 75 MG Oral CAPSULE SR 24 HR Take 1 capsule (75 mg) by mouth daily. 90 capsule 3    Vitamin E 400 UNITS Oral Tab Take 1 tablet Daily       No current facility-administered medications for this visit.        ALLERGIES:  Review of patient's allergies indicates:  Allergies   Allergen Reactions    Adhesives Swelling and Other     Burning sensation    Latex Itching    Pneumovax [Pneumococcal Polysaccharide Vaccine] Fever, Nausea/Vomiting and Swelling    Tramadol Other     Heart fluttering       FAMILY HISTORY:  Family History     Problem (# of Occurrences) Relation (Name,Age of Onset)    Alcohol/Drug (2) Sister: alcoholism/ drug addiction, Son: alcoholism/ drug addiction    Alzheimer's disease (1) Father Engineer, structural)    Arthritis (3) Brother, Maternal Grandmother, Maternal Grandfather    Asthma (1) Mother Jana Half)    Broken Bones (1) Mother Jana Half)    Cancer (4) Sister, Brother, Paternal 60, Son (24): colon-early stages    Depression (4) Mother Jana Half), Maternal Grandmother, Sister, Son    Hearing Loss (2) Father Clair Gulling), Paternal Grandfather    Heart Attack (2) Father Clair Gulling), Paternal Grandmother    Hypertension (4) Mother Jana Half), Father Clair Gulling), Maternal Grandmother, Paternal Grandmother    Kidney Disorder (1) Mother Jana Half)    Lipids/Cholesterol (2) Mother Jana Half), Father Clair Gulling)    Osteoporosis (3) Mother Jana Half), Maternal Grandmother, Paternal Grandmother    Stroke (1) Maternal Grandmother    Thyroid Disease (1) Sister          SOCIAL HISTORY:  Social History     Social History    Marital status: Married     Spouse name: Darell Hoffmaster    Number of children: 3    Years of education: N/A     Occupational History    Not on file.     Social History Main Topics    Smoking status: Former Smoker     Packs/day: 1.00     Years: 40.00     Types: Cigarettes     Start date: 05/05/1971     Quit date: 05/05/2011     Smokeless tobacco: Never Used    Alcohol use 1.8 oz/week     3 Shots of liquor per week    Drug use: No    Sexual activity: Yes     Partners: Male     Birth control/ protection: Surgical     Other Topics Concern    Not on file     Social History Narrative    10/04/13: Married, 3 children.  Not working currently.    05/15/16: Married,  3 children.  Not currently working, husband travels a lot for work.       Note: Parts of this document have beencreated with voice recognition software.Although I have proofread the note, transcription errors may still exist.     Portions of this chart were written by Lillette Boxer. Cashton Hosley, Medical Scribe with oversight by Dr. Verdia Kuba, MD.   08/31/2017. 1:36 PM    I, Joen Laura, MD, interviewed and examined the patient while overseeing the documentation performed by my Medical Scribe, Annie Main Jansen Goodpasture. I have reviewed and revised as necessary the scribes note and agree with the documented findings and plan of care. Please refer to the scribe's note above for further detailed information regarding the patient 08/31/2017. 6:50 PM

## 2017-08-31 NOTE — Progress Notes (Signed)
The patient presents today for Facial pain   Last visit: New patient   Vital complete:  [facility] records available/ reconciled:  YES  PHQ2 complete:  YES  Pharmacy complete: YES   Refills pended:  NO  Medications reviewed:  YES    Medical, surgical, family, and social updated:  YES  Health Maintenance updated:  YES  Recent imaging or hospital visit?:  NO (if yes=pull up images/documents)  Physician added to patient's care team:  YES

## 2017-08-31 NOTE — Patient Instructions (Addendum)
Most likely your trigeminal nerve is intact EXCEPT for the tooth that is triggering your pain.    In the absence of any other triggers, I suspect that the tooth is the cause of your facial pain.    Therefore it would seem reasonable to remove the tooth.    However, I would first obtain a second opinion from an endodontist.    Return for follow up as needed.

## 2017-09-02 ENCOUNTER — Encounter (INDEPENDENT_AMBULATORY_CARE_PROVIDER_SITE_OTHER): Payer: Self-pay | Admitting: Family Medicine

## 2017-09-30 ENCOUNTER — Encounter (INDEPENDENT_AMBULATORY_CARE_PROVIDER_SITE_OTHER): Payer: 59 | Admitting: Neurology

## 2017-10-07 ENCOUNTER — Telehealth (INDEPENDENT_AMBULATORY_CARE_PROVIDER_SITE_OTHER): Payer: Self-pay | Admitting: Family Medicine

## 2017-10-07 DIAGNOSIS — I10 Essential (primary) hypertension: Secondary | ICD-10-CM

## 2017-10-07 DIAGNOSIS — E782 Mixed hyperlipidemia: Secondary | ICD-10-CM

## 2017-10-07 DIAGNOSIS — R7989 Other specified abnormal findings of blood chemistry: Secondary | ICD-10-CM

## 2017-10-07 DIAGNOSIS — Z905 Acquired absence of kidney: Secondary | ICD-10-CM

## 2017-10-07 DIAGNOSIS — M47816 Spondylosis without myelopathy or radiculopathy, lumbar region: Secondary | ICD-10-CM

## 2017-10-07 DIAGNOSIS — E790 Hyperuricemia without signs of inflammatory arthritis and tophaceous disease: Secondary | ICD-10-CM

## 2017-10-07 DIAGNOSIS — R7303 Prediabetes: Secondary | ICD-10-CM

## 2017-10-07 NOTE — Telephone Encounter (Addendum)
Spoke with patient over the phone.  She is likely going to be moving to [place] soon, so she would like to come in for a visit and follow up on her medications.  She may also be due for lab work.      Per record review, patient's last CPE and labs were 05/19/17.  She is overdue for follow up labs, including A1c, CMP, cholesterol, and uric acid.    Patient would like to get labs done prior to an appt.  Scheduled appt for 10/29/17 and she will get fasting labs done prior to appt at [facility].      Also requesting refill of Norco:  Medication Requested: Norco 5/325, by patient call  Last seen:  05/19/17 for preventive/wellness  Last seen for diagnosis pertinent to refill: 05/19/17  Last labs (as applicable): 5/63/89  Last fill (as applicable): 3/73/42--AJG checked and verified.  Follow up due: appt scheduled for 10/29/17   Does patient need to be contacted?  YES-once Rx authorized/ready for pick up.

## 2017-10-08 MED ORDER — HYDROCODONE-ACETAMINOPHEN 5-325 MG OR TABS
1.0000 | ORAL_TABLET | Freq: Four times a day (QID) | ORAL | 0 refills | Status: AC | PRN
Start: 2017-10-08 — End: ?

## 2017-10-08 NOTE — Telephone Encounter (Signed)
Called and notified patient that Rx Norco ready for pick up at [name] will call.  Also notified her that labs ordered that she can get done at [name] prior to her upcoming appt.

## 2017-10-11 ENCOUNTER — Telehealth (INDEPENDENT_AMBULATORY_CARE_PROVIDER_SITE_OTHER): Payer: Self-pay | Admitting: Family Medicine

## 2017-10-11 NOTE — Telephone Encounter (Signed)
Appointment Request:    Reason for appointment (injury/illness/etc): Possible illness, low temperature, body aches   Requested date/time: ASAP   Unable to appoint because: no appts available   Additional information: Pt called in requesting to be seen with Dr. Marylin Crosby asap. Pt states that she started having these symptoms over the weekend and she states that it feels like the symptoms she had with her recent liver virus. Pt states that she is concerned. Please advise and call pt to discuss.   Waitlist: No  Return Call: Detailed message on voicemail

## 2017-10-11 NOTE — Telephone Encounter (Signed)
I called and spoke with pt, who scheduled an appointment with Dr Rothmier tomorrow. Pt will call and cancel if she ends up going to urgent care today.

## 2017-10-12 ENCOUNTER — Encounter (INDEPENDENT_AMBULATORY_CARE_PROVIDER_SITE_OTHER): Payer: Self-pay | Admitting: Family Medicine

## 2017-10-12 ENCOUNTER — Ambulatory Visit: Payer: 59 | Attending: Family Medicine

## 2017-10-12 ENCOUNTER — Ambulatory Visit (INDEPENDENT_AMBULATORY_CARE_PROVIDER_SITE_OTHER): Payer: 59 | Admitting: Family Medicine

## 2017-10-12 VITALS — BP 161/90 | HR 78 | Temp 98.2°F | Ht 70.0 in | Wt 294.0 lb

## 2017-10-12 DIAGNOSIS — B349 Viral infection, unspecified: Secondary | ICD-10-CM

## 2017-10-12 DIAGNOSIS — E782 Mixed hyperlipidemia: Secondary | ICD-10-CM

## 2017-10-12 DIAGNOSIS — I10 Essential (primary) hypertension: Secondary | ICD-10-CM | POA: Insufficient documentation

## 2017-10-12 DIAGNOSIS — E790 Hyperuricemia without signs of inflammatory arthritis and tophaceous disease: Secondary | ICD-10-CM

## 2017-10-12 DIAGNOSIS — R7989 Other specified abnormal findings of blood chemistry: Secondary | ICD-10-CM

## 2017-10-12 DIAGNOSIS — R7303 Prediabetes: Secondary | ICD-10-CM | POA: Insufficient documentation

## 2017-10-12 DIAGNOSIS — Z905 Acquired absence of kidney: Secondary | ICD-10-CM

## 2017-10-12 DIAGNOSIS — Z6841 Body Mass Index (BMI) 40.0 and over, adult: Secondary | ICD-10-CM

## 2017-10-12 LAB — CBC, DIFF
% Basophils: 1 %
% Eosinophils: 2 %
% Immature Granulocytes: 0 %
% Lymphocytes: 23 %
% Monocytes: 5 %
% Neutrophils: 69 %
% Nucleated RBC: 0 %
Absolute Eosinophil Count: 0.12 10*3/uL (ref 0.00–0.50)
Absolute Lymphocyte Count: 1.64 10*3/uL (ref 1.00–4.80)
Basophils: 0.04 10*3/uL (ref 0.00–0.20)
Hematocrit: 37 % (ref 36–45)
Hemoglobin: 11.5 g/dL (ref 11.5–15.5)
Immature Granulocytes: 0.02 10*3/uL (ref 0.00–0.05)
MCH: 29.8 pg (ref 27.3–33.6)
MCHC: 31.4 g/dL — ABNORMAL LOW (ref 32.2–36.5)
MCV: 95 fL (ref 81–98)
Monocytes: 0.38 10*3/uL (ref 0.00–0.80)
Neutrophils: 4.93 10*3/uL (ref 1.80–7.00)
Nucleated RBC: 0 10*3/uL
Platelet Count: 318 10*3/uL (ref 150–400)
RBC: 3.86 10*6/uL (ref 3.80–5.00)
RDW-CV: 13.5 % (ref 11.6–14.4)
WBC: 7.13 10*3/uL (ref 4.3–10.0)

## 2017-10-12 LAB — COMPREHENSIVE METABOLIC PANEL
ALT (GPT): 23 U/L (ref 7–33)
AST (GOT): 23 U/L (ref 9–38)
Albumin: 4 g/dL (ref 3.5–5.2)
Alkaline Phosphatase (Total): 95 U/L (ref 38–172)
Anion Gap: 10 (ref 4–12)
Bilirubin (Total): 0.4 mg/dL (ref 0.2–1.3)
Calcium: 9.4 mg/dL (ref 8.9–10.2)
Carbon Dioxide, Total: 26 meq/L (ref 22–32)
Chloride: 106 meq/L (ref 98–108)
Creatinine: 0.93 mg/dL (ref 0.38–1.02)
GFR, Calc, African American: >60 mL/min/{1.73_m2} (ref >59)
GFR, Calc, European American: >60 mL/min/{1.73_m2} (ref >59)
Glucose: 119 mg/dL (ref 62–125)
Potassium: 4.3 meq/L (ref 3.6–5.2)
Protein (Total): 6.7 g/dL (ref 6.0–8.2)
Sodium: 142 meq/L (ref 135–145)
Urea Nitrogen: 13 mg/dL (ref 8–21)

## 2017-10-12 LAB — LIPID PANEL
Cholesterol (LDL): 98 mg/dL (ref <130)
Cholesterol/HDL Ratio: 3.2
HDL Cholesterol: 56 mg/dL (ref >39)
Non-HDL Cholesterol: 124 mg/dL (ref 0–159)
Total Cholesterol: 180 mg/dL (ref <200)
Triglyceride: 132 mg/dL (ref <150)

## 2017-10-12 LAB — URIC ACID, SERUM: Uric Acid: 6.5 mg/dL (ref 2.6–6.8)

## 2017-10-12 NOTE — Progress Notes (Signed)
Labs drawn per order: right antecub, one attempt, no complications.

## 2017-10-12 NOTE — Progress Notes (Signed)
Chief Complaint   Patient presents with    Fatigue     since Saturday, pt feels weak, dizzy, out of breath, low body temps, diarrhea, body aches; reports that she feels somewhat better today, but not much; feels similar to when she had hepatitis; moving to [place] in 3 weeks       SUBJECTIVE:  Lindsey [name] is a 66 year old female who presents today for Illness symptoms 4 days.  She states she initially felt weak and dizzy and she had diarrhea over the weekend.  She then started noticing some joint pain.  She denies any fevers or chills.  She exited feels like her temperature was lower than normal.  She denies a cough, headaches.  She denies any urinary symptoms.  She has really done nothing other than lying around and she actually feels better today.  She is concerned that she will be moving to [place] in 3 weeks.  She is also here today to have her blood work redrawn as part of her follow-up from her physical.    ROS:  Other than those listed in HPI, all other review of systems are negative    Review of patient's allergies indicates:  Allergies   Allergen Reactions    Adhesives Swelling and Other     Burning sensation    Latex Itching    Pneumovax [Pneumococcal Polysaccharide Vaccine] Fever, Nausea/Vomiting and Swelling    Tramadol Other     Heart fluttering       Current Outpatient Prescriptions   Medication Sig Dispense Refill    Allopurinol 100 MG Oral Tab Take 1 tablet (100 mg) by mouth daily. 90 tablet 3    Aspirin 81 MG Oral Tab 1 TABLET DAILY      BuPROPion HCl, SR, 150 MG Oral TABLET SR 12 HR Take 1 tablet (150 mg) by mouth daily. 90 tablet 3    Cholecalciferol (VITAMIN D3) 2000 UNITS Oral Tab Take 1 tablet Daily      Enalapril Maleate 10 MG Oral Tab Take 1 tablet (10 mg) by mouth daily. 90 tablet 3    Hydrocodone-Acetaminophen 5-325 MG Oral Tab Take 1 tablet by mouth every 6 hours as needed for pain. For SEVERE joint Pain. 60 tablet 0    Multiple Vitamin Oral Tab Take 1 tablet Daily       Omega-3 Fatty Acids (FISH OIL) 1000 MG Oral Cap Take 1 cap Daily      Simvastatin 40 MG Oral Tab Take 0.5 tablets (20 mg) by mouth daily. 45 tablet 3    TraZODone HCl 50 MG Oral Tab Take 1-2 tablets (50-100 mg) by mouth at bedtime as needed for sleep. For sleep 180 tablet 2    Triamterene-HCTZ 37.5-25 MG Oral Tab Take 1 tablet by mouth daily. 90 tablet 3    Venlafaxine HCl ER 75 MG Oral CAPSULE SR 24 HR Take 1 capsule (75 mg) by mouth daily. 90 capsule 3     No current facility-administered medications for this visit.        Family History     Problem (# of Occurrences) Relation (Name,Age of Onset)    Alcohol/Drug (2) Sister: alcoholism/ drug addiction, Son: alcoholism/ drug addiction    Alzheimer's disease (1) Father Clair Gulling)    Arthritis (3) Brother, Maternal Grandmother, Maternal Grandfather    Asthma (1) Mother Jana Half)    Broken Bones (1) Mother Jana Half)    Cancer (4) Sister, Brother, Paternal Grandfather, Son (60): colon-early stages  Depression (4) Mother Jana Half), Maternal Grandmother, Sister, Son    Hearing Loss (2) Father Clair Gulling), Paternal Grandfather    Heart Attack (2) Father Clair Gulling), Paternal Grandmother    Hypertension (4) Mother Jana Half), Father Clair Gulling), Maternal Grandmother, Paternal Grandmother    Kidney Disorder (1) Mother Jana Half)    Lipids/Cholesterol (2) Mother Jana Half), Father Clair Gulling)    Osteoporosis (3) Mother Jana Half), Maternal Grandmother, Paternal Grandmother    Stroke (1) Maternal Grandmother    Thyroid Disease (1) Sister          Social History     Social History    Marital status: Married     Spouse name: Darell Garfield    Number of children: 3    Years of education: N/A     Occupational History    Not on file.     Social History Main Topics    Smoking status: Former Smoker     Packs/day: 1.00     Years: 40.00     Types: Cigarettes     Start date: 05/05/1971     Quit date: 05/05/2011    Smokeless tobacco: Never Used    Alcohol use 1.8 oz/week     3 Shots of liquor per week    Drug  use: No    Sexual activity: Yes     Partners: Male     Birth control/ protection: Surgical     Other Topics Concern    Not on file     Social History Narrative    10/04/13: Married, 3 children.  Not working currently.    05/15/16: Married, 3 children.  Not currently working, husband travels a lot for work.         OBJECTIVE: Ht 5\' 10"  (1.778 m) Wt 294 lb (133.358 kg) Body mass index is 42.18 kg/m. BP 161/90    [name] 78    Temp 98.2 F (36.8 C) (Oral)    Ht 5\' 10"  (1.778 m)    Wt (!) 294 lb (133.4 kg)    BMI 42.18 kg/m    GEN:  Well developed, well nourished female in no acute distress, sitting comfortably in the exam room.  HEENT:  AT/NC.  Pupils equal, round and reactive to light.  Sclera is nonicteric. Extra occular muscles intact.  TM's clear bilaterally  .  Nasal turbinates:  Minimal erythema   Oral pharynx: Clear    NECK:  Supple.  No LAD  LUNGS: CTA bilaterally.  No R/W/R.  CARDIOVASCULAR: RRR, no murmurs, gallops or rubs.  ABDOMEN: Soft, nontender.  No rebound or guarding.  No masses are appreciated    MUSCULOSKELETAL: Her extremities are without clubbing, cyanosis, or edema  NEURO:  Alert and Oriented times three.  DTR's equal and symmetric.  Affect is normal.      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  She is jovial and laughing in the room.  Her vital signs are stable.  Her exam is normal.  At this point she can manage this expectantly.    Hypertension: Her blood pressure is elevated today.  She admits that she forgot to take her medications yesterday and took them only before she came today.  We will recheck this again.  She is planning on  stopping by prior to moving.    History of partial nephrectomy and  elevated serum creatinine: She is due for repeat CMP.  This was ordered and drawn today.    Prediabetes: Her hemoglobin A1c had been increasing.  We'll recheck her hemoglobin A1c today.    Mixed hyperlipidemia: She is due for a lipid panel.  This was drawn.    Hyperuricemia: Her uric acid level is elevated.  She has been working on her diet.  We will recheck her uric acid level today    F/U:  PRN      Nelda Marseille. Jocelynne Duquette, MD, FAAFP  [facility]  [place], [place] [address]  T: [phone]  F: [phone]      This document was generated in part using voice recognition technology.  Although every effort was made to insure accuracy, transcription errors may occur.

## 2017-10-13 LAB — HEMOGLOBIN A1C, HPLC: Hemoglobin A1C: 6.4 % — ABNORMAL HIGH (ref 4.0–6.0)

## 2017-10-19 ENCOUNTER — Other Ambulatory Visit (HOSPITAL_BASED_OUTPATIENT_CLINIC_OR_DEPARTMENT_OTHER): Payer: Self-pay | Admitting: Urology

## 2017-10-19 DIAGNOSIS — C642 Malignant neoplasm of left kidney, except renal pelvis: Secondary | ICD-10-CM

## 2017-10-27 ENCOUNTER — Ambulatory Visit: Payer: 59 | Attending: Urology

## 2017-10-27 DIAGNOSIS — C642 Malignant neoplasm of left kidney, except renal pelvis: Secondary | ICD-10-CM

## 2017-10-27 LAB — CREATININE BY I_STAT (POC), ~~LOC~~: Creatinine (POC): 1.1 mg/dL — ABNORMAL HIGH (ref 0.38–1.02)

## 2017-10-29 ENCOUNTER — Encounter (INDEPENDENT_AMBULATORY_CARE_PROVIDER_SITE_OTHER): Payer: 59 | Admitting: Family Medicine

## 2017-11-03 ENCOUNTER — Telehealth (HOSPITAL_BASED_OUTPATIENT_CLINIC_OR_DEPARTMENT_OTHER): Payer: Self-pay | Admitting: Urology

## 2017-11-03 NOTE — Telephone Encounter (Signed)
Pt would like a call back to discuss her CT results from 10/27/2017

## 2017-11-16 ENCOUNTER — Encounter (INDEPENDENT_AMBULATORY_CARE_PROVIDER_SITE_OTHER): Payer: Self-pay | Admitting: Family Medicine

## 2017-11-16 ENCOUNTER — Other Ambulatory Visit (INDEPENDENT_AMBULATORY_CARE_PROVIDER_SITE_OTHER): Payer: Self-pay | Admitting: Family Medicine

## 2017-11-16 DIAGNOSIS — I10 Essential (primary) hypertension: Secondary | ICD-10-CM

## 2017-11-16 DIAGNOSIS — F329 Major depressive disorder, single episode, unspecified: Secondary | ICD-10-CM

## 2017-11-16 DIAGNOSIS — Z760 Encounter for issue of repeat prescription: Secondary | ICD-10-CM

## 2017-11-17 MED ORDER — TRAZODONE HCL 50 MG OR TABS
50.0000 mg | ORAL_TABLET | Freq: Every evening | ORAL | 2 refills | Status: AC | PRN
Start: 2017-11-17 — End: ?

## 2017-11-17 MED ORDER — TRIAMTERENE-HCTZ 37.5-25 MG OR TABS
1.0000 | ORAL_TABLET | Freq: Every day | ORAL | 3 refills | Status: AC
Start: 2017-11-17 — End: ?

## 2017-11-17 NOTE — Telephone Encounter (Signed)
Medication Requested: Triamterene-HCTZ 37.5-25 MG Oral Tab, by patient  Last seen:  10/12/17 for viral syndrome  Last seen for diagnosis pertinent to refill: 05/19/17 for CPE  Last labs (as applicable): n/a  Last fill (as applicable): 7/34/19  Follow up due: [place] out of state and will be establishing with a new PCP   Does patient need to be contacted?  YES      Medication Requested: TraZODone HCl 50 MG Oral Tab, by patient  Last seen for diagnosis pertinent to refill: 05/19/17  Last labs (as applicable): n/a  Last fill (as applicable): 3/79/02      eCare message will be sent once meds are ordered

## 2017-12-24 ENCOUNTER — Other Ambulatory Visit (INDEPENDENT_AMBULATORY_CARE_PROVIDER_SITE_OTHER): Payer: Self-pay | Admitting: Family Medicine

## 2017-12-24 DIAGNOSIS — Z760 Encounter for issue of repeat prescription: Secondary | ICD-10-CM

## 2017-12-24 DIAGNOSIS — F331 Major depressive disorder, recurrent, moderate: Secondary | ICD-10-CM

## 2017-12-24 MED ORDER — SIMVASTATIN 40 MG OR TABS
20.0000 mg | ORAL_TABLET | Freq: Every day | ORAL | 1 refills | Status: AC
Start: 2017-12-24 — End: ?

## 2017-12-24 MED ORDER — VENLAFAXINE HCL ER 75 MG OR CP24
75.0000 mg | EXTENDED_RELEASE_CAPSULE | Freq: Every day | ORAL | 1 refills | Status: AC
Start: 2017-12-24 — End: ?

## 2017-12-24 NOTE — Telephone Encounter (Signed)
Medication Requested: Simvastatin 40 mg, Venlafaxine ER 75 mg, by pharmacy  Last seen:  10/12/17 for Viral syndrome, HTN, h/o partial nephrectomy, elevated serum Cr, prediabetes, HLD, hyperuricemia.    Last seen for diagnosis pertinent to refill: 10/12/17  Last labs (as applicable): 83/81/84  Last fill (as applicable): n/a  Follow up due: patient has moved to [facility].  Will refill now but she is looking for new provider   Does patient need to be contacted?  NO

## 2017-12-27 ENCOUNTER — Encounter (INDEPENDENT_AMBULATORY_CARE_PROVIDER_SITE_OTHER): Payer: Self-pay | Admitting: Family Medicine

## 2017-12-30 ENCOUNTER — Ambulatory Visit: Payer: Managed Care, Other (non HMO) | Admitting: Physician Assistant

## 2017-12-30 ENCOUNTER — Encounter: Payer: Self-pay | Admitting: Physician Assistant

## 2017-12-30 VITALS — BP 142/70 | HR 75 | Ht 70.0 in | Wt 304.0 lb

## 2017-12-30 DIAGNOSIS — E782 Mixed hyperlipidemia: Secondary | ICD-10-CM | POA: Diagnosis not present

## 2017-12-30 DIAGNOSIS — Z7689 Persons encountering health services in other specified circumstances: Secondary | ICD-10-CM

## 2017-12-30 DIAGNOSIS — G8929 Other chronic pain: Secondary | ICD-10-CM

## 2017-12-30 DIAGNOSIS — Z23 Encounter for immunization: Secondary | ICD-10-CM

## 2017-12-30 DIAGNOSIS — Z5181 Encounter for therapeutic drug level monitoring: Secondary | ICD-10-CM | POA: Diagnosis not present

## 2017-12-30 DIAGNOSIS — Z1231 Encounter for screening mammogram for malignant neoplasm of breast: Secondary | ICD-10-CM | POA: Diagnosis not present

## 2017-12-30 DIAGNOSIS — M545 Low back pain: Secondary | ICD-10-CM | POA: Diagnosis not present

## 2017-12-30 DIAGNOSIS — M5459 Other low back pain: Secondary | ICD-10-CM

## 2017-12-30 DIAGNOSIS — Z85820 Personal history of malignant melanoma of skin: Secondary | ICD-10-CM | POA: Insufficient documentation

## 2017-12-30 DIAGNOSIS — Z79891 Long term (current) use of opiate analgesic: Secondary | ICD-10-CM | POA: Diagnosis not present

## 2017-12-30 DIAGNOSIS — I10 Essential (primary) hypertension: Secondary | ICD-10-CM

## 2017-12-30 DIAGNOSIS — F3341 Major depressive disorder, recurrent, in partial remission: Secondary | ICD-10-CM | POA: Diagnosis not present

## 2017-12-30 DIAGNOSIS — Z85528 Personal history of other malignant neoplasm of kidney: Secondary | ICD-10-CM | POA: Insufficient documentation

## 2017-12-30 MED ORDER — ZOSTER VAC RECOMB ADJUVANTED 50 MCG/0.5ML IM SUSR: 0.5000 mL | Freq: Once | INTRAMUSCULAR | 1 refills | Status: AC

## 2017-12-30 MED ORDER — HYDROCODONE-ACETAMINOPHEN 5-325 MG PO TABS: 1.0000 | ORAL_TABLET | Freq: Four times a day (QID) | ORAL | 0 refills | Status: DC | PRN

## 2017-12-30 MED ORDER — SENNOSIDES-DOCUSATE SODIUM 8.6-50 MG PO TABS: 2.0000 | ORAL_TABLET | Freq: Two times a day (BID) | ORAL | 5 refills | Status: DC

## 2017-12-30 NOTE — Patient Instructions (Signed)
For medication refills Request all medication refills through your pharmacy EXCEPT for your hydrocodone Contact our office for refills [phone], select triage option You will need to be seen in office every 3 months  For your blood pressure: - Goal <130/80 - Continue your daily medications - Check blood pressure at home  - Limit salt to <2000 mg/day - Follow DASH eating plan - avoid tobacco products - weight loss: 7% of current body weight - follow-up in 3 months - return if BP consistently >140/90

## 2017-12-30 NOTE — Progress Notes (Signed)
HPI:                                                                Bonnie [name] is a 67 y.o. 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 She does not use pain medication daily and states 60 will usually last her >2 months. She reports pain has been worse the last 2 months since relocating to [facility]. Prior treatments: physical therapy, rhizotomy, lumbar fusion, NSAIDs, Tylenol  HTN: taking Maxzide and Vasotec daily. Compliant with medications. She also has OSA and is on CPAP, set at 20 mmH2O. Does not check BP's at home. Denies vision change, headache, chest pain with exertion, orthopnea, lightheadedness, syncope and edema. Risk factors include: HLD, obesity, age>55  Depression/Anxiety: taking Effexor 75 mg and Wellbutrin 150 mg without difficulty. Well-controlled. Denies depressed mood or anhedonia. Denies symptoms of mania/hypomania. Denies suicidal thinking. Denies auditory/visual hallucinations.   Opioid Risk Tool - 12/30/17 1100      Family History of Substance Abuse   Alcohol  Positive Female    Illegal Drugs  Positive Female    Rx Drugs  Positive Female or Female      Personal History of Substance Abuse   Alcohol  Negative    Illegal Drugs  Negative    Rx Drugs  Negative      Age   Age between 83-45 years   No      History of Preadolescent Sexual Abuse   History of Preadolescent Sexual Abuse  Negative or Female      Psychological Disease   Psychological Disease  Negative    Depression  Positive      Total Score   Opioid Risk Tool Scoring  8    Opioid Risk Interpretation  High Risk      Depression screen PHQ 2/9 12/30/2017  Decreased Interest 0  Down, Depressed, Hopeless 0  PHQ - 2 Score 0  Altered sleeping 1  Tired, decreased energy 1   Change in appetite 1  Feeling bad or failure about yourself  0  Trouble concentrating 0  Moving slowly or fidgety/restless 0  Suicidal thoughts 0  PHQ-9 Score 3  Difficult doing work/chores Not difficult at all   No flowsheet data found.     Past Medical History:  Diagnosis Date  . Chronic low back pain   . Colon polyps   . Depression   . Fatty liver   . Hypertension   . 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 Osteoarthritis of lumbar spine   . Renal cancer ([place])   . Skin cancer    Past Surgical History:  Procedure Laterality Date  . ABDOMINAL HYSTERECTOMY    . BILE DUCT STENT PLACEMENT    . CHOLECYSTECTOMY    . LUMBAR FUSION    . MOHS SURGERY     x 11  . PARTIAL NEPHRECTOMY     Social History   Tobacco Use  . Smoking status: Former Smoker    Types: Cigarettes    Last attempt to quit: 12/21/2010  Years since quitting: 7.0  . Smokeless tobacco: Never Used  Substance Use Topics  . Alcohol use: Yes    Alcohol/week: 0.6 - 1.2 oz    Types: 1 - 2 Standard drinks or equivalent per week   family history includes Alcohol abuse in her sister, sister, son, and son; Depression in her mother, sister, and sister; Heart attack in her father; Hyperlipidemia in her father and mother; Hypertension in her father and mother.  ROS: negative except as noted in the HPI  Medications: Current Outpatient Medications  Medication Sig Dispense Refill  . allopurinol (ZYLOPRIM) 100 MG tablet Take 100 mg by mouth daily.    Marland Kitchen buPROPion (WELLBUTRIN SR) 150 MG 12 hr tablet Take 150 mg by mouth daily.    . enalapril (VASOTEC) 10 MG tablet Take 1 tablet by mouth daily.    Marland Kitchen HYDROcodone-acetaminophen (NORCO/VICODIN) 5-325 MG tablet Take 1 tablet by mouth every 6 (six) hours as needed for severe pain. 60 tablet 0  . simvastatin (ZOCOR) 40 MG tablet Take 0.5 tablets by mouth at bedtime.    . traZODone (DESYREL) 50 MG tablet Take 1 tablet by mouth at bedtime as needed.    .  triamterene-hydrochlorothiazide (MAXZIDE-25) 37.5-25 MG tablet Take 1 tablet by mouth daily.    Marland Kitchen venlafaxine XR (EFFEXOR-XR) 75 MG 24 hr capsule Take 75 mg by mouth daily.    . Cholecalciferol (VITAMIN D3) 2000 units TABS Take 1 tablet by mouth daily.    . Multiple Vitamin tablet Take 1 tablet by mouth.    . Omega-3 Fatty Acids (FISH OIL) 1000 MG CAPS Take by mouth.    . senna-docusate (SENOKOT-S) 8.6-50 MG tablet Take 2 tablets by mouth 2 (two) times daily. When taking hydrocodone 60 tablet 5  . Zoster Vaccine Adjuvanted [facility]) injection Inject 0.5 mLs into the muscle once for 1 dose. Repeat in 2-6 months 0.5 mL 1   No current facility-administered medications for this visit.    Allergies  Allergen Reactions  . Latex Itching  . Pneumococcal Polysaccharide Vaccine Swelling and Nausea And Vomiting  . Tape Other (See Comments) and Swelling    Burning sensation  . Tramadol Other (See Comments)    Heart fluttering       Objective:  BP (!) 142/70   Pulse 75   Ht 5\' 10"  (1.778 m)   Wt (!) 304 lb (137.9 kg)   SpO2 94%   BMI 43.62 kg/m  Gen:  alert, not ill-appearing, no distress, appropriate for age, obese female HEENT: head normocephalic without obvious abnormality, conjunctiva and cornea clear, trachea midline Pulm: Normal work of breathing, normal phonation, clear to auscultation bilaterally, no wheezes, rales or rhonchi CV: Normal rate, regular rhythm, s1 and s2 distinct, no murmurs, clicks or rubs, radial pulses 2+ symmetric Neuro: alert and oriented x 3, no tremor MSK: extremities atraumatic, normal gait and station, trace pitting edema Skin: intact, no rashes on exposed skin, no jaundice, no cyanosis Psych: well-groomed, cooperative, good eye contact, euthymic mood, affect mood-congruent, speech is articulate, and thought processes clear and goal-directed    No results found for this or any previous visit (from the past 72 hour(s)). No results found.    Assessment  and Plan: 67 y.o. female with  1. Encounter for chronic pain management Pain contract reviewed and signed by patient Opioid Risk Tool completed: high risk Indication for chronic opioid: facet arthritis, chronic low back pain, chronic kidney disease contraindicates long-term NSAID use Prior treatments: physical therapy, rhizotomy, lumbar  fusion, NSAIDs, Tylenol Medication and dose: Hydrocodone-Acetaminophen 5-325 mg # pills per month: 60 Last UDS date: today, pending Pain contract signed (Y/N): Y Date narcotic database last reviewed (include red flags): 12/30/17, no data in [place] - Drugs of abuse screen w/o alc, rtn urine-sln - HYDROcodone-acetaminophen (NORCO/VICODIN) 5-325 MG tablet; Take 1 tablet by mouth every 6 (six) hours as needed for severe pain.  Dispense: 60 tablet; Refill: 0  2. Chronic use of opiate for therapeutic purpose - Drugs of abuse screen w/o alc, rtn urine-sln; Future - Drugs of abuse screen w/o alc, rtn urine-sln - senna-docusate (SENOKOT-S) 8.6-50 MG tablet; Take 2 tablets by mouth 2 (two) times daily. When taking hydrocodone  Dispense: 60 tablet; Refill: 5 - HYDROcodone-acetaminophen (NORCO/VICODIN) 5-325 MG tablet; Take 1 tablet by mouth every 6 (six) hours as needed for severe pain.  Dispense: 60 tablet; Refill: 0  3. Hypertension goal BP (blood pressure) < 130/80 BP Readings from Last 3 Encounters:  12/30/17 (!) 142/70  - Goal <130/80 - Continue your daily medications - Check blood pressure at home  - Limit salt to <2000 mg/day - Follow DASH eating plan - avoid tobacco products - weight loss: 7% of current body weight - follow-up in 3 months - return if BP consistently >140/90   4. Encounter for medication monitoring - checking renal function, will be due for routine fasting labs Oct 2019 - COMPLETE METABOLIC PANEL WITH GFR  5. Lumbar facet joint pain - HYDROcodone-acetaminophen (NORCO/VICODIN) 5-325 MG tablet; Take 1 tablet by mouth every 6 (six) hours  as needed for severe pain.  Dispense: 60 tablet; Refill: 0  6. Recurrent major depressive disorder, in partial remission (HCC) - PHQ9 score 3, well controlled, no acute safety issues - continue Wellbutrin and Effexor - okay to refill for 6 months  7. Need for shingles vaccine - Zoster Vaccine Adjuvanted [facility]) injection; Inject 0.5 mLs into the muscle once for 1 dose. Repeat in 2-6 months  Dispense: 0.5 mL; Refill: 1  8. Encounter for screening mammogram for breast cancer - MM SCREENING BREAST TOMO BILATERAL; Future  9. Encounter to establish care - Reviewed PMH, PSH, PFH, medications and allergies - Reviewed health maintenance Mammogram 12/06/17 Colonoscopy 2015  Dexa 06/2017 - PHQ2 negative - Reviewed outside labs from [place] from 10/13/2017 and 10/27/2017  Patient education and anticipatory guidance given Patient agrees with treatment plan Follow-up in 3 months for medication management or sooner as needed if symptoms worsen or fail to improve  Darlyne Russian PA-C

## 2017-12-31 ENCOUNTER — Encounter: Payer: Self-pay | Admitting: Physician Assistant

## 2017-12-31 DIAGNOSIS — N1831 Chronic kidney disease, stage 3a: Secondary | ICD-10-CM

## 2017-12-31 DIAGNOSIS — N183 Chronic kidney disease, stage 3 (moderate): Secondary | ICD-10-CM

## 2017-12-31 HISTORY — DX: Chronic kidney disease, stage 3a: N18.31

## 2017-12-31 LAB — COMPLETE METABOLIC PANEL WITH GFR
AG Ratio: 1.5 (calc) (ref 1.0–2.5)
ALKALINE PHOSPHATASE (APISO): 90 U/L (ref 33–130)
ALT: 25 U/L (ref 6–29)
AST: 21 U/L (ref 10–35)
Albumin: 4 g/dL (ref 3.6–5.1)
BUN/Creatinine Ratio: 15 (calc) (ref 6–22)
BUN: 15 mg/dL (ref 7–25)
CHLORIDE: 106 mmol/L (ref 98–110)
CO2: 27 mmol/L (ref 20–32)
CREATININE: 1.03 mg/dL — AB (ref 0.50–0.99)
Calcium: 9.6 mg/dL (ref 8.6–10.4)
GFR, Est African American: 66 mL/min/{1.73_m2} (ref >=60)
GFR, Est Non African American: 57 mL/min/{1.73_m2} — ABNORMAL LOW (ref >=60)
GLUCOSE: 110 mg/dL (ref 65–139)
Globulin: 2.6 g/dL (calc) (ref 1.9–3.7)
Potassium: 3.9 mmol/L (ref 3.5–5.3)
Sodium: 141 mmol/L (ref 135–146)
Total Bilirubin: 0.5 mg/dL (ref 0.2–1.2)
Total Protein: 6.6 g/dL (ref 6.1–8.1)

## 2017-12-31 NOTE — Progress Notes (Signed)
Very mild reduction in kidney function Monitor every 6 months

## 2018-01-01 LAB — DRUGS OF ABUSE SCREEN W/O ALC, ROUTINE URINE
AMPHETAMINES (1000 ng/mL SCRN): NEGATIVE
BARBITURATES: NEGATIVE
BENZODIAZEPINES: NEGATIVE
COCAINE METABOLITES: NEGATIVE
MARIJUANA MET (50 ng/mL SCRN): NEGATIVE
METHADONE: NEGATIVE
METHAQUALONE: NEGATIVE
OPIATES: NEGATIVE
PHENCYCLIDINE: NEGATIVE
PROPOXYPHENE: NEGATIVE

## 2018-01-10 ENCOUNTER — Other Ambulatory Visit: Payer: Self-pay

## 2018-01-10 ENCOUNTER — Emergency Department
Admission: EM | Admit: 2018-01-10 | Discharge: 2018-01-10 | Disposition: A | Discharge status: Home / Self Care | Payer: Managed Care, Other (non HMO) | Source: Home / Self Care | Attending: Family Medicine | Admitting: Family Medicine

## 2018-01-10 DIAGNOSIS — J029 Acute pharyngitis, unspecified: Secondary | ICD-10-CM

## 2018-01-10 LAB — POCT RAPID STREP A (OFFICE): Rapid Strep A Screen: NEGATIVE

## 2018-01-10 NOTE — ED Provider Notes (Signed)
[facility]    CSN: [id number] Arrival date & time: 01/10/18  1155     History   Chief Complaint Chief Complaint  Patient presents with  . Sore Throat    HPI Bonnie [name] is a 67 y.o. female.   Patient complains of three day history of right side sore throat and mild fatigue, without other symptoms.  She recalls having chills yesterday.   The history is provided by the patient.    Past Medical History:  Diagnosis Date  . Chronic kidney disease, stage 3a ([place]) 12/31/2017  . Chronic low back pain   . Colon polyps   . Depression   . Fatty liver   . Hypertension   . 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 Osteoarthritis of lumbar spine   . Renal cancer ([place])   . Skin cancer     Patient Active Problem List   Diagnosis Date Noted  . Chronic kidney disease, stage 3a ([place]) 12/31/2017  . Encounter for chronic pain management 12/30/2017  . Chronic use of opiate for therapeutic purpose 12/30/2017  . History of malignant melanoma of skin 12/30/2017  . History of renal cell carcinoma 12/30/2017  . Craniofacial pain syndrome 08/31/2017  . Arthritis of carpometacarpal [facility]) joint of left thumb 05/19/2017  . Controlled substance agreement signed 03/30/2017  . H/O acute pancreatitis 07/17/2016  . History of biliary stent insertion 07/17/2016  . Coronary artery calcification seen on CT scan 11/08/2015  . Lumbar facet joint pain 09/23/2015  . History of back surgery 09/23/2015  . Spondylosis of lumbar region without myelopathy or radiculopathy 08/30/2015  . Osteoarthritis of both knees 03/01/2015  . Cellulitis of face 02/19/2015  . Anxiety 07/24/2014  . Fracture, radius, distal 03/26/2014  . Basal cell carcinoma 10/04/2013  . Depression, major 10/04/2013  . Lumbar degenerative disc disease 10/04/2013  . Squamous cell carcinoma 10/04/2013  . Shortness of breath 04/20/2012  . Essential hypertension 03/29/2012  . Former smoker 03/29/2012  . Gout  03/29/2012  . Hyperlipidemia 03/29/2012  . Palpitations 03/29/2012  . OSA on CPAP 03/29/2012    Past Surgical History:  Procedure Laterality Date  . ABDOMINAL HYSTERECTOMY    . BILE DUCT STENT PLACEMENT    . CHOLECYSTECTOMY    . LUMBAR FUSION    . MOHS SURGERY     x 11  . PARTIAL NEPHRECTOMY      OB History    Gravida Para Term Preterm AB Living   2 2       2    SAB TAB Ectopic Multiple Live Births           2       Home Medications    Prior to Admission medications   Medication Sig Start Date End Date Taking? Authorizing Provider  allopurinol (ZYLOPRIM) 100 MG tablet Take 100 mg by mouth daily. 03/30/17   [provider]  buPROPion (WELLBUTRIN SR) 150 MG 12 hr tablet Take 150 mg by mouth daily. 03/30/17   [provider]  Cholecalciferol (VITAMIN D3) 2000 units TABS Take 1 tablet by mouth daily.    [provider]  enalapril (VASOTEC) 10 MG tablet Take 1 tablet by mouth daily. 03/30/17   [provider]  HYDROcodone-acetaminophen (NORCO/VICODIN) 5-325 MG tablet Take 1 tablet by mouth every 6 (six) hours as needed for severe pain. 12/30/17   Trixie Dredge, PA-C  Multiple Vitamin tablet Take 1 tablet by mouth.    [provider]  Omega-3 Fatty Acids (FISH OIL) 1000 MG CAPS Take by mouth.    [provider]  senna-docusate (SENOKOT-S) 8.6-50 MG tablet Take 2 tablets by mouth 2 (two) times daily. When taking hydrocodone 12/30/17   Trixie Dredge, PA-C  simvastatin (ZOCOR) 40 MG tablet Take 0.5 tablets by mouth at bedtime. 03/30/17   [provider]  traZODone (DESYREL) 50 MG tablet Take 1 tablet by mouth at bedtime as needed. 11/17/17   [provider]  triamterene-hydrochlorothiazide (MAXZIDE-25) 37.5-25 MG tablet Take 1 tablet by mouth daily. 11/17/17   [provider]  venlafaxine XR (EFFEXOR-XR) 75 MG 24 hr capsule Take 75 mg by mouth daily. 03/30/17   [provider]     Family History Family History  Problem Relation Age of Onset  . Depression Mother   . Hyperlipidemia Mother   . Hypertension Mother   . Heart attack Father   . Hyperlipidemia Father   . Hypertension Father   . Alcohol abuse Sister   . Depression Sister   . Alcohol abuse Son   . Alcohol abuse Son   . Alcohol abuse Sister   . Depression Sister     Social History Social History   Tobacco Use  . Smoking status: Former Smoker    Packs/day: 1.00    Years: 40.00    Pack years: 40.00    Types: Cigarettes    Last attempt to quit: 12/21/2010    Years since quitting: 7.0  . Smokeless tobacco: Never Used  Substance Use Topics  . Alcohol use: Yes    Alcohol/week: 0.6 - 1.2 oz    Types: 1 - 2 Standard drinks or equivalent per week  . Drug use: No     Allergies   Latex; Pneumococcal polysaccharide vaccine; Tape; and Tramadol   Review of Systems Review of Systems + sore throat No cough No pleuritic pain No wheezing No nasal congestion ? post-nasal drainage No sinus pain/pressure No itchy/red eyes No earache No hemoptysis No SOB No fever, + chills No nausea No vomiting No abdominal pain No diarrhea No urinary symptoms No skin rash + fatigue No myalgias No headache Used OTC meds without relief   Physical Exam Triage Vital Signs ED Triage Vitals  Enc Vitals Group     BP 01/10/18 1239 139/85     Pulse Rate 01/10/18 1239 (!) 102     Resp --      Temp 01/10/18 1239 97.9 F (36.6 C)     Temp Source 01/10/18 1239 Oral     SpO2 01/10/18 1239 96 %     Weight 01/10/18 1240 (!) 302 lb (137 kg)     Height 01/10/18 1240 5\' 10"  (1.778 m)     Head Circumference --      Peak Flow --      Pain Score 01/10/18 1239 7     Pain Loc --      Pain Edu? --      Excl. in [place]? --    No data found.  Updated Vital Signs BP 139/85 (BP Location: Right Arm)   Pulse (!) 102   Temp 97.9 F (36.6 C) (Oral)   Ht 5\' 10"  (1.778 m)   Wt (!) 302 lb (137 kg)   SpO2 96%   BMI  43.33 kg/m   Visual Acuity Right Eye Distance:   Left Eye Distance:   Bilateral Distance:    Right Eye Near:   Left Eye Near:  Bilateral Near:     Physical Exam Nursing notes and Vital Signs reviewed. Appearance:  Patient appears stated age, and in no acute distress Eyes:  Pupils are equal, round, and reactive to light and accomodation.  Extraocular movement is intact.  Conjunctivae are not inflamed  Ears:  Canals normal.  Tympanic membranes normal.  Nose:  Normal turbinates.  No sinus tenderness. Pharynx:  Right tonsillar area mildly erythematous Neck:  Supple.  Tender right tonsillar node.  Lungs:  Clear to auscultation.  Breath sounds are equal.  Moving air well. Heart:  Regular rate and rhythm without murmurs, rubs, or gallops.  Abdomen:  Nontender without masses or hepatosplenomegaly.  Bowel sounds are present.  No CVA or flank tenderness.  Extremities:  No edema.  Skin:  No rash present.    UC Treatments / Results  Labs (all labs ordered are listed, but only abnormal results are displayed) Labs Reviewed  STREP A DNA PROBE  POCT RAPID STREP A (OFFICE) negative    EKG  EKG Interpretation None       Radiology No results found.  Procedures Procedures (including critical care time)  Medications Ordered in UC Medications - No data to display   Initial Impression / Assessment and Plan / UC Course  I have reviewed the triage vital signs and the nursing notes.  Pertinent labs & imaging results that were available during my care of the patient were reviewed by me and considered in my medical decision making (see chart for details).    There is no evidence of bacterial infection today.   Throat culture pending. Try warm salt water gargles for sore throat.  May take Tylenol as needed for pain. Followup with Family Doctor if not improved in one week.     Final Clinical Impressions(s) / UC Diagnoses   Final diagnoses:  Pharyngitis, unspecified etiology     ED Discharge Orders    None           Kandra Nicolas, MD 01/15/18 1416

## 2018-01-10 NOTE — Discharge Instructions (Signed)
Try warm salt water gargles for sore throat.  May take Tylenol as needed for pain.

## 2018-01-10 NOTE — ED Triage Notes (Signed)
Pt started with a sore throat on Friday.  Mainly on the right side.  No other sx.

## 2018-01-11 ENCOUNTER — Telehealth: Payer: Self-pay

## 2018-01-11 LAB — STREP A DNA PROBE: Group A Strep Probe: NOT DETECTED

## 2018-01-11 NOTE — Telephone Encounter (Signed)
Left message with lab results and contact information if any questions or concerns. 

## 2018-01-12 ENCOUNTER — Ambulatory Visit (INDEPENDENT_AMBULATORY_CARE_PROVIDER_SITE_OTHER): Payer: Managed Care, Other (non HMO)

## 2018-01-12 DIAGNOSIS — R928 Other abnormal and inconclusive findings on diagnostic imaging of breast: Secondary | ICD-10-CM

## 2018-01-12 DIAGNOSIS — Z1231 Encounter for screening mammogram for malignant neoplasm of breast: Secondary | ICD-10-CM

## 2018-01-20 ENCOUNTER — Encounter: Payer: Self-pay | Admitting: Physician Assistant

## 2018-01-20 DIAGNOSIS — R928 Other abnormal and inconclusive findings on diagnostic imaging of breast: Secondary | ICD-10-CM | POA: Insufficient documentation

## 2018-01-20 HISTORY — DX: Other abnormal and inconclusive findings on diagnostic imaging of breast: R92.8

## 2018-01-21 ENCOUNTER — Other Ambulatory Visit: Payer: Self-pay | Admitting: Physician Assistant

## 2018-01-21 DIAGNOSIS — R928 Other abnormal and inconclusive findings on diagnostic imaging of breast: Secondary | ICD-10-CM

## 2018-01-26 ENCOUNTER — Encounter: Payer: Self-pay | Admitting: Physician Assistant

## 2018-01-26 ENCOUNTER — Ambulatory Visit
Admission: RE | Admit: 2018-01-26 | Discharge: 2018-01-26 | Disposition: A | Discharge status: Home / Self Care | Payer: Managed Care, Other (non HMO) | Source: Ambulatory Visit | Attending: Physician Assistant | Admitting: Physician Assistant

## 2018-01-26 DIAGNOSIS — N6001 Solitary cyst of right breast: Secondary | ICD-10-CM

## 2018-01-26 DIAGNOSIS — R928 Other abnormal and inconclusive findings on diagnostic imaging of breast: Secondary | ICD-10-CM

## 2018-01-26 HISTORY — DX: Solitary cyst of right breast: N60.01

## 2018-02-08 ENCOUNTER — Telehealth: Payer: Self-pay | Admitting: Physician Assistant

## 2018-02-08 NOTE — Telephone Encounter (Signed)
Called pt to let her know that it was ready for pick up. Thanks

## 2018-02-08 NOTE — Telephone Encounter (Signed)
Patient's handicap parking application is ready at the front desk

## 2018-02-22 ENCOUNTER — Telehealth: Payer: Self-pay | Admitting: Physician Assistant

## 2018-02-22 DIAGNOSIS — M159 Polyosteoarthritis, unspecified: Secondary | ICD-10-CM

## 2018-02-22 DIAGNOSIS — M150 Primary generalized (osteo)arthritis: Secondary | ICD-10-CM

## 2018-02-22 DIAGNOSIS — Z79891 Long term (current) use of opiate analgesic: Secondary | ICD-10-CM

## 2018-02-22 MED ORDER — HYDROCODONE-ACETAMINOPHEN 5-325 MG PO TABS: 1.0000 | ORAL_TABLET | Freq: Three times a day (TID) | ORAL | 0 refills | Status: DC | PRN

## 2018-02-22 NOTE — Telephone Encounter (Signed)
Pt stated she is in need of a refill on her Hydrocodon and would like that sent to the [facility] in [place]. Thanks

## 2018-02-22 NOTE — Telephone Encounter (Signed)
Patient can fill prescription on March 11,2019 (sent electronically) Her pain contract is for 60 pills every 60 days

## 2018-02-23 NOTE — Telephone Encounter (Signed)
Called pt and advised her she can pick her prescription up on March 11th. Thanks

## 2018-02-28 ENCOUNTER — Encounter: Payer: Self-pay | Admitting: Sports Medicine

## 2018-02-28 ENCOUNTER — Ambulatory Visit: Payer: Managed Care, Other (non HMO) | Admitting: Sports Medicine

## 2018-02-28 DIAGNOSIS — M47816 Spondylosis without myelopathy or radiculopathy, lumbar region: Secondary | ICD-10-CM

## 2018-02-28 DIAGNOSIS — M79672 Pain in left foot: Secondary | ICD-10-CM

## 2018-02-28 DIAGNOSIS — M170 Bilateral primary osteoarthritis of knee: Secondary | ICD-10-CM

## 2018-02-28 DIAGNOSIS — M79671 Pain in right foot: Secondary | ICD-10-CM

## 2018-02-28 NOTE — Assessment & Plan Note (Signed)
Did have a facet radiofrequency ablation in [place], fantastic relief for about 3-4 weeks. She is going to get me records so we can see which facets were ablated, and likely do this again, she will probably get a better response the second time.

## 2018-02-28 NOTE — Assessment & Plan Note (Signed)
I think desperate measures are needed here, referral to bariatric surgery.

## 2018-02-28 NOTE — Assessment & Plan Note (Signed)
With CKD and a partial nephrectomy going to avoid use of NSAIDs if possible. Bilateral steroid injection, referral to [place] surgery to discuss sleeve gastrectomy. Rehab exercises given, return to see me in a month.

## 2018-02-28 NOTE — Progress Notes (Signed)
Subjective:    I'm seeing this patient as a consultation for: Bonnie Chimes, PA-C  CC: Polyarthralgia  HPI: This is a very pleasant 67 year old female, for years she is dealt with arthritis in her back, knees.  For now her pain is the worst in both knees, moderate, persistent at the joint lines, positive for gelling, no mechanical symptoms, no trauma.  She also has significant axial low back pain, she underwent a radiofrequency ablation of several facet joints in [place] sometime ago, but her response was only short-lived, a second radio frequency ablation was not done.  She is going to get me records for this.  She also has some bilateral foot pain, pes planus, desires custom molded orthotics.  Lastly, she does have significant morbid obesity, she has watched her mother gain weight, and become confined to wheelchair over years and years, she is agreeable to work aggressively to prevent this.  History of partial nephrectomy, CKD 3, so we are limiting use of NSAIDs.  I reviewed the past medical history, family history, social history, surgical history, and allergies today and no changes were needed.  Please see the problem list section below in epic for further details.  Past Medical History: Past Medical History:  Diagnosis Date  . Abnormal mammogram of right breast 01/20/2018  . Benign cyst of right breast 01/26/2018  . Chronic kidney disease, stage 3a ([place]) 12/31/2017  . Chronic low back pain   . Colon polyps   . Depression   . Fatty liver   . Hypertension   . Melanoma in situ of right upper extremity ([place])   . Osteoarthritis of lumbar spine   . Renal cancer ([place])   . Skin cancer    Past Surgical History: Past Surgical History:  Procedure Laterality Date  . ABDOMINAL HYSTERECTOMY    . BILE DUCT STENT PLACEMENT    . CHOLECYSTECTOMY    . LUMBAR FUSION    . MOHS SURGERY     x 11  . PARTIAL NEPHRECTOMY     Social History: Social History   Socioeconomic History  .  Marital status: Married    Spouse name: None  . Number of children: None  . Years of education: None  . Highest education level: None  Social Needs  . Financial resource strain: None  . Food insecurity - worry: None  . Food insecurity - inability: None  . Transportation needs - medical: None  . Transportation needs - non-medical: None  Occupational History  . None  Tobacco Use  . Smoking status: Former Smoker    Packs/day: 1.00    Years: 40.00    Pack years: 40.00    Types: Cigarettes    Last attempt to quit: 12/21/2010    Years since quitting: 7.1  . Smokeless tobacco: Never Used  Substance and Sexual Activity  . Alcohol use: Yes    Alcohol/week: 0.6 - 1.2 oz    Types: 1 - 2 Standard drinks or equivalent per week  . Drug use: No  . Sexual activity: Yes    Birth control/protection: Surgical, None  Other Topics Concern  . None  Social History Narrative  . None   Family History: Family History  Problem Relation Age of Onset  . Depression Mother   . Hyperlipidemia Mother   . Hypertension Mother   . Heart attack Father   . Hyperlipidemia Father   . Hypertension Father   . Alcohol abuse Sister   . Depression Sister   .  Alcohol abuse Son   . Alcohol abuse Son   . Alcohol abuse Sister   . Depression Sister    Allergies: Allergies  Allergen Reactions  . Latex Itching  . Pneumococcal Polysaccharide Vaccine Swelling and Nausea And Vomiting  . Tape Other (See Comments) and Swelling    Burning sensation  . Tramadol Other (See Comments)    Heart fluttering   Medications: See med rec.  Review of Systems: No headache, visual changes, nausea, vomiting, diarrhea, constipation, dizziness, abdominal pain, skin rash, fevers, chills, night sweats, weight loss, swollen lymph nodes, body aches, joint swelling, muscle aches, chest pain, shortness of breath, mood changes, visual or auditory hallucinations.   Objective:   General: Well Developed, well nourished, and in no acute  distress.  Neuro:  Extra-ocular muscles intact, able to move all 4 extremities, sensation grossly intact.  Deep tendon reflexes tested were normal. Psych: Alert and oriented, mood congruent with affect. ENT:  Ears and nose appear unremarkable.  Hearing grossly normal. Neck: Unremarkable overall appearance, trachea midline.  No visible thyroid enlargement. Eyes: Conjunctivae and lids appear unremarkable.  Pupils equal and round. Skin: Warm and dry, no rashes noted.  Cardiovascular: Pulses palpable, no extremity edema. Bilateral knees: Minimally swollen, tender at the medial joint lines ROM normal in flexion and extension and lower leg rotation. Ligaments with solid consistent endpoints including ACL, PCL, LCL, MCL. Negative Mcmurray's and provocative meniscal tests. Non painful patellar compression. Patellar and quadriceps tendons unremarkable. Hamstring and quadriceps strength is normal.  Procedure: Real-time Ultrasound Guided Injection of left knee Device: GE Logiq E  Verbal informed consent obtained.  Time-out conducted.  Noted no overlying erythema, induration, or other signs of local infection.  Skin prepped in a sterile fashion.  Local anesthesia: Topical Ethyl chloride.  With sterile technique and under real time ultrasound guidance: 1 cc kenalog 40, 2 cc lidocaine, 2 cc bupivacaine injected easily Completed without difficulty  Pain immediately resolved suggesting accurate placement of the medication.  Advised to call if fevers/chills, erythema, induration, drainage, or persistent bleeding.  Images permanently stored and available for review in the ultrasound unit.  Impression: Technically successful ultrasound guided injection.  Procedure: Real-time Ultrasound Guided Injection of right knee Device: GE Logiq E  Verbal informed consent obtained.  Time-out conducted.  Noted no overlying erythema, induration, or other signs of local infection.  Skin prepped in a sterile  fashion.  Local anesthesia: Topical Ethyl chloride.  With sterile technique and under real time ultrasound guidance: 1 cc kenalog 40, 2 cc lidocaine, 2 cc bupivacaine injected easily Completed without difficulty  Pain immediately resolved suggesting accurate placement of the medication.  Advised to call if fevers/chills, erythema, induration, drainage, or persistent bleeding.  Images permanently stored and available for review in the ultrasound unit.  Impression: Technically successful ultrasound guided injection.  Impression and Recommendations:   This case required medical decision making of moderate complexity.  Primary osteoarthritis of both knees With CKD and a partial nephrectomy going to avoid use of NSAIDs if possible. Bilateral steroid injection, referral to [facility] to discuss sleeve gastrectomy. Rehab exercises given, return to see me in a month.  Spondylosis of lumbar region without myelopathy or radiculopathy Did have a facet radiofrequency ablation in [facility], fantastic relief for about 3-4 weeks. She is going to get me records so we can see which facets were ablated, and likely do this again, she will probably get a better response the second time.  Morbid obesity ([place]) I  think desperate measures are needed here, referral to bariatric surgery.  Bilateral foot pain [name] is going to return for custom orthotics. ___________________________________________ Gwen Her. Dianah Field, M.D., ABFM., CAQSM. Primary Care and [place] Instructor of [place] [employer]

## 2018-02-28 NOTE — Assessment & Plan Note (Signed)
Bonnie [name] is going to return for custom orthotics.

## 2018-03-18 ENCOUNTER — Encounter: Payer: Self-pay | Admitting: Physician Assistant

## 2018-03-28 ENCOUNTER — Encounter: Payer: Self-pay | Admitting: Sports Medicine

## 2018-03-28 ENCOUNTER — Ambulatory Visit: Payer: Managed Care, Other (non HMO) | Admitting: Sports Medicine

## 2018-03-28 DIAGNOSIS — M170 Bilateral primary osteoarthritis of knee: Secondary | ICD-10-CM | POA: Diagnosis not present

## 2018-03-28 DIAGNOSIS — B351 Tinea unguium: Secondary | ICD-10-CM | POA: Diagnosis not present

## 2018-03-28 DIAGNOSIS — M79672 Pain in left foot: Secondary | ICD-10-CM

## 2018-03-28 DIAGNOSIS — M79671 Pain in right foot: Secondary | ICD-10-CM | POA: Diagnosis not present

## 2018-03-28 DIAGNOSIS — M47816 Spondylosis without myelopathy or radiculopathy, lumbar region: Secondary | ICD-10-CM

## 2018-03-28 MED ORDER — TERBINAFINE HCL 250 MG PO TABS: 250.0000 mg | ORAL_TABLET | Freq: Every day | ORAL | 2 refills | Status: DC

## 2018-03-28 NOTE — Assessment & Plan Note (Signed)
Radio frequency ablation was bilateral L4-L5 and L5-S1 facet joints, ordering repeat medial branch blocks followed by radio frequency ablation if successful.

## 2018-03-28 NOTE — Assessment & Plan Note (Signed)
Custom orthotics as above. 

## 2018-03-28 NOTE — Assessment & Plan Note (Signed)
She has not yet gotten a call from bariatric surgery, sending a note to referrals for assistance.

## 2018-03-28 NOTE — Assessment & Plan Note (Signed)
Starting terbinafine, there is no dosage adjustment for renal insufficiency, her creatinine is in the normal range. Further follow-up with PCP for this.  Often to As we expect 6-9 months treatment before changes in nails are noted.

## 2018-03-28 NOTE — Assessment & Plan Note (Signed)
[facility] approval, only a few days relief with steroid injection.

## 2018-03-28 NOTE — Progress Notes (Signed)
    Patient was fitted for a : standard, cushioned, semi-rigid orthotic. The orthotic was heated and afterward the patient stood on the orthotic blank positioned on the orthotic stand. The patient was positioned in subtalar neutral position and 10 degrees of ankle dorsiflexion in a weight bearing stance. After completion of molding, a stable base was applied to the orthotic blank. The blank was ground to a stable position for weight bearing. Size: 9 Base: White EVA Additional Posting and Padding: None The patient ambulated these, and they were very comfortable.  I spent 40 minutes with this patient, greater than 50% was face-to-face time counseling regarding the below diagnosis.  ___________________________________________ Thomas J. Thekkekandam, M.D., ABFM., CAQSM. Primary Care and Sports Medicine [facility]  Adjunct Instructor of Family Medicine  [employer]   

## 2018-03-29 ENCOUNTER — Telehealth: Payer: Self-pay | Admitting: Sports Medicine

## 2018-03-29 NOTE — Telephone Encounter (Signed)
-----   Message from Tasia Catchings, [place] sent at 03/29/2018  9:04 AM EDT ----- Information has been submitted to [facility] and awaiting determination.  ----- Message ----- From: Silverio Decamp, MD Sent: 03/28/2018   2:39 PM To: Tasia Catchings, CMA  Or[facility] approval please, both knees. ___________________________________________ Gwen Her. Dianah Field, M.D., ABFM., CAQSM. Primary Care and [place] Instructor of [place] [employer] [facility] [employer]

## 2018-03-30 NOTE — Telephone Encounter (Signed)
[facility] is covered.  All services associated with the office visit, injection and product are covered under one copay of $55. The deductible has been met and the patient's responsibility is 0% for [id number] and the administration. Once the out of pocket is met, the patient will have no financial responsibility. Call reference number is [id number]  Patient is agreeable to start [facility] and will work on PA for medication.

## 2018-03-30 NOTE — Progress Notes (Signed)
[facility] [place] called [facility] they received referral and they will work on it employee who handled bariatric referrals left and they do have that position filled but they have a small list they are working on . - CF

## 2018-03-31 ENCOUNTER — Ambulatory Visit: Payer: Managed Care, Other (non HMO) | Admitting: Physician Assistant

## 2018-03-31 ENCOUNTER — Encounter: Payer: Self-pay | Admitting: Physician Assistant

## 2018-03-31 VITALS — BP 130/78 | HR 82 | Resp 14 | Wt 298.0 lb

## 2018-03-31 DIAGNOSIS — N183 Chronic kidney disease, stage 3 (moderate): Secondary | ICD-10-CM

## 2018-03-31 DIAGNOSIS — E785 Hyperlipidemia, unspecified: Secondary | ICD-10-CM | POA: Diagnosis not present

## 2018-03-31 DIAGNOSIS — I10 Essential (primary) hypertension: Secondary | ICD-10-CM

## 2018-03-31 DIAGNOSIS — Z713 Dietary counseling and surveillance: Secondary | ICD-10-CM | POA: Diagnosis not present

## 2018-03-31 DIAGNOSIS — Z6841 Body Mass Index (BMI) 40.0 and over, adult: Secondary | ICD-10-CM

## 2018-03-31 DIAGNOSIS — Z79899 Other long term (current) drug therapy: Secondary | ICD-10-CM

## 2018-03-31 DIAGNOSIS — M1A0710 Idiopathic chronic gout, right ankle and foot, without tophus (tophi): Secondary | ICD-10-CM | POA: Diagnosis not present

## 2018-03-31 DIAGNOSIS — F3342 Major depressive disorder, recurrent, in full remission: Secondary | ICD-10-CM | POA: Diagnosis not present

## 2018-03-31 DIAGNOSIS — I2510 Atherosclerotic heart disease of native coronary artery without angina pectoris: Secondary | ICD-10-CM | POA: Diagnosis not present

## 2018-03-31 DIAGNOSIS — R7303 Prediabetes: Secondary | ICD-10-CM | POA: Diagnosis not present

## 2018-03-31 DIAGNOSIS — N1831 Chronic kidney disease, stage 3a: Secondary | ICD-10-CM

## 2018-03-31 MED ORDER — LIRAGLUTIDE -WEIGHT MANAGEMENT 18 MG/3ML ~~LOC~~ SOPN
3.0000 mg | PEN_INJECTOR | Freq: Every day | SUBCUTANEOUS | 0 refills | Status: DC
Start: 2018-03-31 — End: 2018-04-04

## 2018-03-31 MED ORDER — VENLAFAXINE HCL ER 75 MG PO CP24: 75.0000 mg | ORAL_CAPSULE | Freq: Every day | ORAL | 1 refills | Status: DC

## 2018-03-31 MED ORDER — SIMVASTATIN 20 MG PO TABS: 20.0000 mg | ORAL_TABLET | Freq: Every day | ORAL | 1 refills | Status: DC

## 2018-03-31 MED ORDER — ALLOPURINOL 100 MG PO TABS: 100.0000 mg | ORAL_TABLET | Freq: Every day | ORAL | 1 refills | Status: DC

## 2018-03-31 MED ORDER — TRAZODONE HCL 50 MG PO TABS: 50.0000 mg | ORAL_TABLET | Freq: Every evening | ORAL | 1 refills | Status: DC | PRN

## 2018-03-31 MED ORDER — ENALAPRIL MALEATE 10 MG PO TABS: 10.0000 mg | ORAL_TABLET | Freq: Every day | ORAL | 1 refills | Status: DC

## 2018-03-31 MED ORDER — BUPROPION HCL ER (SR) 150 MG PO TB12: 150.0000 mg | ORAL_TABLET | Freq: Every day | ORAL | 1 refills | Status: DC

## 2018-03-31 MED ORDER — TRIAMTERENE-HCTZ 37.5-25 MG PO TABS: 1.0000 | ORAL_TABLET | Freq: Every day | ORAL | 1 refills | Status: DC

## 2018-03-31 NOTE — Progress Notes (Signed)
HPI:                                                                Bonnie [name] is a 67 y.o. female who presents to [facility]: [facility] today for medication management  Current concerns: weight loss  Referred for medical weight management by her Sports Doctor Current weight 298 Lb Has been at current weight for approximately 2 years She endorses steady weight gain for the last 10 years. She was 218 in 2009 Prior weight loss attempts: calorie restriction Barriers to weight loss: OA and chronic back pain  Depression/Anxiety: taking Effexor and wellbutrin without difficulty. Has been in remission for over a year on this regimen. Denies anhedonia or depressed mood. Denies symptoms of mania/hypomania. Denies suicidal thinking. Denies auditory/visual hallucinations.  HLD/CAD: taking Simvastatin 20 mg daily and Fish Oil. Compliant with statin therapy. Denies myalgias. Denies exertional chest pain or claudication.  HTN: taking Maxzide and Enalapril daily. Compliant with medications. Denies vision change, headache, chest pain with exertion, orthopnea, lightheadedness, syncope and edema. Risk factors include: age>65, HLD, fam hx   Depression screen [facility] 2/9 12/30/2017  Decreased Interest 0  Down, Depressed, Hopeless 0  PHQ - 2 Score 0  Altered sleeping 1  Tired, decreased energy 1  Change in appetite 1  Feeling bad or failure about yourself  0  Trouble concentrating 0  Moving slowly or fidgety/restless 0  Suicidal thoughts 0  PHQ-9 Score 3  Difficult doing work/chores Not difficult at all    No flowsheet data found.    Past Medical History:  Diagnosis Date  . Abnormal mammogram of right breast 01/20/2018  . Benign cyst of right breast 01/26/2018  . Chronic kidney disease, stage 3a ([place]) 12/31/2017  . Chronic low back pain   . Colon polyps   . Depression   . Fatty liver   . Hypertension   . Melanoma in situ of right upper extremity ([place])    . Osteoarthritis of lumbar spine   . Renal cancer ([place])   . Skin cancer    Past Surgical History:  Procedure Laterality Date  . ABDOMINAL HYSTERECTOMY    . BILE DUCT STENT PLACEMENT    . CHOLECYSTECTOMY    . LUMBAR FUSION    . MOHS SURGERY     x 11  . PARTIAL NEPHRECTOMY     Social History   Tobacco Use  . Smoking status: Former Smoker    Packs/day: 1.00    Years: 40.00    Pack years: 40.00    Types: Cigarettes    Last attempt to quit: 12/21/2010    Years since quitting: 7.2  . Smokeless tobacco: Never Used  Substance Use Topics  . Alcohol use: Yes    Alcohol/week: 0.6 - 1.2 oz    Types: 1 - 2 Standard drinks or equivalent per week   family history includes Alcohol abuse in her sister, sister, son, and son; Depression in her mother, sister, and sister; Heart attack in her father; Hyperlipidemia in her father and mother; Hypertension in her father and mother.    ROS: negative except as noted in the HPI  Medications: Current Outpatient Medications  Medication Sig Dispense Refill  . allopurinol (ZYLOPRIM) 100 MG tablet Take  1 tablet (100 mg total) by mouth daily. 90 tablet 1  . buPROPion (WELLBUTRIN SR) 150 MG 12 hr tablet Take 1 tablet (150 mg total) by mouth daily. 90 tablet 1  . Cholecalciferol (VITAMIN D3) 2000 units TABS Take 1 tablet by mouth daily.    . enalapril (VASOTEC) 10 MG tablet Take 1 tablet (10 mg total) by mouth daily. 90 tablet 1  . HYDROcodone-acetaminophen (NORCO/VICODIN) 5-325 MG tablet Take 1 tablet by mouth every 8 (eight) hours as needed for severe pain. 60 tablet 0  . Multiple Vitamin tablet Take 1 tablet by mouth.    . Omega-3 Fatty Acids (FISH OIL) 1000 MG CAPS Take by mouth.    . senna-docusate (SENOKOT-S) 8.6-50 MG tablet Take 2 tablets by mouth 2 (two) times daily. When taking hydrocodone 60 tablet 5  . simvastatin (ZOCOR) 20 MG tablet Take 1 tablet (20 mg total) by mouth at bedtime. 90 tablet 1  . terbinafine (LAMISIL) 250 MG tablet Take 1  tablet (250 mg total) by mouth daily. 90 tablet 2  . traZODone (DESYREL) 50 MG tablet Take 1 tablet (50 mg total) by mouth at bedtime as needed. 90 tablet 1  . triamterene-hydrochlorothiazide (MAXZIDE-25) 37.5-25 MG tablet Take 1 tablet by mouth daily. 90 tablet 1  . Liraglutide -Weight Management (SAXENDA) 18 MG/3ML SOPN Inject 3 mg into the skin daily. 0.6 mg inj subcut daily for 1 week, then incr by 0.6 mg weekly until reaching 3 mg injected subcut daily 5 pen 0  . venlafaxine XR (EFFEXOR-XR) 75 MG 24 hr capsule Take 1 capsule (75 mg total) by mouth daily. 90 capsule 1   No current facility-administered medications for this visit.    Allergies  Allergen Reactions  . Latex Itching  . Pneumococcal Polysaccharide Vaccine Swelling and Nausea And Vomiting  . Tape Other (See Comments) and Swelling    Burning sensation  . Tramadol Other (See Comments)    Heart fluttering       Objective:  BP 130/78   Pulse 82   Resp 14   Wt 298 lb (135.2 kg) Comment: refuse  BMI 42.76 kg/m  Gen:  alert, not ill-appearing, no distress, appropriate for age, obese female HEENT: head normocephalic without obvious abnormality, conjunctiva and cornea clear, trachea midline Pulm: Normal work of breathing, normal phonation, clear to auscultation bilaterally, no wheezes, rales or rhonchi CV: Normal rate, regular rhythm, s1 and s2 distinct, no murmurs, clicks or rubs  Neuro: alert and oriented x 3, no tremor MSK: extremities atraumatic, normal gait and station Skin: intact, no rashes on exposed skin, no jaundice, no cyanosis Psych: well-groomed, cooperative, good eye contact, euthymic mood, affect mood-congruent, speech is articulate, and thought processes clear and goal-directed    No results found for this or any previous visit (from the past 72 hour(s)). No results found.    Assessment and Plan: 67 y.o. female with   Class 3 severe obesity with serious comorbidity Wt Readings from Last 3  Encounters:  03/31/18 298 lb (135.2 kg)  03/28/18 298 lb (135.2 kg)  02/28/18 298 lb (135.2 kg)  - she has lost 6 pounds on her own since establish care here. This is very encouraging. She is a candidate for bariatric surgery, but we will try medical weight loss management first. - counseled on weight loss through 1800-2100 calorie moderate protein diet and increased physical activity - she is interested in [place]. She has never tried any weight loss medication. Will avoid Phentermine and Qsymia due  to CVD. Kirke Shaggy is a good choice with her insulin resistance. We will try to get this approved for her.  CKD - checking renal function today and Q6 months - continue ACE - no need for renal dose adjustments - avoid nephrotoxic meds  Hypertension BP Readings from Last 3 Encounters:  03/31/18 130/78  03/28/18 130/81  02/28/18 131/85  - BP in range. Continue daily medications. Counseled on therapeutic lifestyle changes   Encounter for weight loss counseling  Coronary artery calcification seen on CT scan - Plan: simvastatin (ZOCOR) 20 MG tablet  Hyperlipidemia, unspecified hyperlipidemia type - Plan: simvastatin (ZOCOR) 20 MG tablet, Lipid Panel w/reflex Direct LDL  Recurrent major depressive disorder, in full remission ([place]) - Plan: venlafaxine XR (EFFEXOR-XR) 75 MG 24 hr capsule, traZODone (DESYREL) 50 MG tablet, buPROPion (WELLBUTRIN SR) 150 MG 12 hr tablet  Essential hypertension - Plan: triamterene-hydrochlorothiazide (MAXZIDE-25) 37.5-25 MG tablet, enalapril (VASOTEC) 10 MG tablet, COMPLETE METABOLIC PANEL WITH GFR  Idiopathic chronic gout of right foot without tophus - Plan: allopurinol (ZYLOPRIM) 100 MG tablet  Prediabetes - Plan: Hemoglobin A1c  Chronic kidney disease, stage 3a ([place]) - Plan: COMPLETE METABOLIC PANEL WITH GFR  Morbid obesity ([place]) - Plan: Liraglutide -Weight Management (SAXENDA) 18 MG/3ML SOPN  Class 3 severe obesity due to excess calories with serious  comorbidity and body mass index (BMI) of 40.0 to 44.9 in adult ([place]) - Plan: Liraglutide -Weight Management (SAXENDA) 18 MG/3ML SOPN  Encounter for long-term (current) use of medications   Patient education and anticipatory guidance given Patient agrees with treatment plan Follow-up in 5 weeks for weight check or sooner as needed if symptoms worsen or fail to improve  Darlyne Russian PA-C

## 2018-03-31 NOTE — Patient Instructions (Addendum)
Weight Loss plan - 1800-2100 calorie moderate protein diet - 30-35% calories from protein - 30% calories from fat - 35-40% from carbs: If diabetic, limit carbs to 30 g per meal and 15 g per snack - MEASURE your calories (MyFitnessPal, LoseItapp of some sort) - 8-11 glasses of water per day - limit caffeine to 1 unsweetened beverage per day - avoid fried foods, saturated fat, and heavily processed foods - keep sugar as low as possible  - follow DASH or Mediterranean diets for recipes - avoid skipping meals. Eat 3 regular meals per day with 1-2 snacks (stay within your calorie goal) OR graze every 4 hours. The important thing is to stay on a schedule - avoid eating within 2 hours of bedtime

## 2018-03-31 NOTE — Telephone Encounter (Signed)
Letter has been faxed and awaiting response.

## 2018-03-31 NOTE — Telephone Encounter (Signed)
Letter written

## 2018-03-31 NOTE — Telephone Encounter (Signed)
After speaking with Glenda at [facility] [place] is not covered and no other [employer] products are not covered either. Please write a letter of medical necessity for the injections. Thanks.

## 2018-04-01 LAB — HEMOGLOBIN A1C
EAG (MMOL/L): 7.9 (calc)
HEMOGLOBIN A1C: 6.6 %{Hb} — AB (ref <5.7)
Mean Plasma Glucose: 143 (calc)

## 2018-04-01 LAB — COMPLETE METABOLIC PANEL WITH GFR
AG RATIO: 1.5 (calc) (ref 1.0–2.5)
ALT: 21 U/L (ref 6–29)
AST: 19 U/L (ref 10–35)
Albumin: 4 g/dL (ref 3.6–5.1)
Alkaline phosphatase (APISO): 96 U/L (ref 33–130)
BILIRUBIN TOTAL: 0.4 mg/dL (ref 0.2–1.2)
BUN/Creatinine Ratio: 15 (calc) (ref 6–22)
BUN: 17 mg/dL (ref 7–25)
CHLORIDE: 106 mmol/L (ref 98–110)
CO2: 29 mmol/L (ref 20–32)
Calcium: 9.9 mg/dL (ref 8.6–10.4)
Creat: 1.1 mg/dL — ABNORMAL HIGH (ref 0.50–0.99)
GFR, Est African American: 61 mL/min/{1.73_m2} (ref >=60)
GFR, Est Non African American: 52 mL/min/{1.73_m2} — ABNORMAL LOW (ref >=60)
Globulin: 2.6 g/dL (calc) (ref 1.9–3.7)
Glucose, Bld: 102 mg/dL — ABNORMAL HIGH (ref 65–99)
POTASSIUM: 4.6 mmol/L (ref 3.5–5.3)
Sodium: 143 mmol/L (ref 135–146)
Total Protein: 6.6 g/dL (ref 6.1–8.1)

## 2018-04-01 LAB — LIPID PANEL W/REFLEX DIRECT LDL
Cholesterol: 213 mg/dL — ABNORMAL HIGH (ref <200)
HDL: 55 mg/dL (ref >50)
LDL Cholesterol (Calc): 121 mg/dL (calc) — ABNORMAL HIGH
Non-HDL Cholesterol (Calc): 158 mg/dL (calc) — ABNORMAL HIGH (ref <130)
TRIGLYCERIDES: 262 mg/dL — AB (ref <150)
Total CHOL/HDL Ratio: 3.9 (calc) (ref <5.0)

## 2018-04-01 NOTE — Progress Notes (Signed)
Good morning Bonnie [name],  Your A1c level came back in the diabetic range. I will need to see you in the office to review diabetic preventive care and adjust your medicines.  Your chronic kidney disease is stable! We will continue to check this every 6 months.  Best, [name]

## 2018-04-03 ENCOUNTER — Encounter: Payer: Self-pay | Admitting: Physician Assistant

## 2018-04-03 DIAGNOSIS — Z713 Dietary counseling and surveillance: Secondary | ICD-10-CM | POA: Insufficient documentation

## 2018-04-03 DIAGNOSIS — Z6841 Body Mass Index (BMI) 40.0 and over, adult: Secondary | ICD-10-CM

## 2018-04-03 DIAGNOSIS — E66813 Obesity, class 3: Secondary | ICD-10-CM | POA: Insufficient documentation

## 2018-04-03 DIAGNOSIS — R7303 Prediabetes: Secondary | ICD-10-CM | POA: Insufficient documentation

## 2018-04-04 ENCOUNTER — Encounter: Payer: Self-pay | Admitting: Physician Assistant

## 2018-04-04 ENCOUNTER — Ambulatory Visit: Payer: Managed Care, Other (non HMO) | Admitting: Physician Assistant

## 2018-04-04 ENCOUNTER — Other Ambulatory Visit: Payer: Self-pay | Admitting: Physician Assistant

## 2018-04-04 VITALS — BP 150/87 | HR 84

## 2018-04-04 DIAGNOSIS — E1169 Type 2 diabetes mellitus with other specified complication: Secondary | ICD-10-CM | POA: Insufficient documentation

## 2018-04-04 DIAGNOSIS — E785 Hyperlipidemia, unspecified: Secondary | ICD-10-CM | POA: Diagnosis not present

## 2018-04-04 DIAGNOSIS — E1165 Type 2 diabetes mellitus with hyperglycemia: Secondary | ICD-10-CM | POA: Diagnosis not present

## 2018-04-04 DIAGNOSIS — Z5181 Encounter for therapeutic drug level monitoring: Secondary | ICD-10-CM

## 2018-04-04 DIAGNOSIS — Z6841 Body Mass Index (BMI) 40.0 and over, adult: Secondary | ICD-10-CM

## 2018-04-04 DIAGNOSIS — Z85528 Personal history of other malignant neoplasm of kidney: Secondary | ICD-10-CM

## 2018-04-04 DIAGNOSIS — E1122 Type 2 diabetes mellitus with diabetic chronic kidney disease: Secondary | ICD-10-CM | POA: Insufficient documentation

## 2018-04-04 MED ORDER — SEMAGLUTIDE(0.25 OR 0.5MG/DOS) 2 MG/1.5ML ~~LOC~~ SOPN: 0.2500 mg | PEN_INJECTOR | SUBCUTANEOUS | 3 refills | Status: DC

## 2018-04-04 MED ORDER — METFORMIN HCL 500 MG PO TABS: 500.0000 mg | ORAL_TABLET | Freq: Two times a day (BID) | ORAL | 1 refills | Status: DC

## 2018-04-04 MED ORDER — ATORVASTATIN CALCIUM 40 MG PO TABS: 40.0000 mg | ORAL_TABLET | Freq: Every day | ORAL | 3 refills | Status: DC

## 2018-04-04 MED ORDER — METFORMIN HCL 500 MG PO TABS: 500.0000 mg | ORAL_TABLET | Freq: Two times a day (BID) | ORAL | 0 refills | Status: DC

## 2018-04-04 MED ORDER — BLOOD GLUCOSE MONITOR KIT: PACK | 0 refills | Status: DC

## 2018-04-04 MED ORDER — LIRAGLUTIDE -WEIGHT MANAGEMENT 18 MG/3ML ~~LOC~~ SOPN: 3.0000 mg | PEN_INJECTOR | Freq: Every day | SUBCUTANEOUS | 0 refills | Status: DC

## 2018-04-04 NOTE — Patient Instructions (Addendum)
Increase Simvastatin 40 mg then switch to Atorvastatin 40 mg nightly  Diabetes Preventive Care: - annual foot exam  - annual dilated eye exam with an eye doctor - self foot exams at least weekly - twice yearly dental cleanings and yearly exam - goal blood pressure <140/90, ideally <130/80 - LDL cholesterol <70 - A1C <7.0 - body mass index (BMI) <25.0 - follow-up every 3 months if your A1C is not at goal - follow-up every 6 months if diabetes is well controlled   Look up [facility] (Dr. Larose Kells) to see if they are in network    Type 2 Diabetes Mellitus, Diagnosis, Adult Type 2 diabetes (type 2 diabetes mellitus) is a long-term (chronic) disease. In type 2 diabetes, one or both of these problems may be present:  The pancreas does not make enough of a hormone called insulin.  Cells in the body do not respond properly to insulin that the body makes (insulin resistance).  Normally, insulin allows blood sugar (glucose) to enter cells in the body. The cells use glucose for energy. Insulin resistance or lack of insulin causes excess glucose to build up in the blood instead of going into cells. As a result, high blood glucose (hyperglycemia) develops. What increases the risk? The following factors may make you more likely to develop type 2 diabetes:  Having a family member with type 2 diabetes.  Being overweight or obese.  Having an inactive (sedentary) lifestyle.  Having been diagnosed with insulin resistance.  Having a history of prediabetes, gestational diabetes, or polycystic ovarian syndrome (PCOS).  Being of American-Indian, African-American, Hispanic/Latino, or Asian/Pacific Islander descent.  What are the signs or symptoms? In the early stage of this condition, you may not have symptoms. Symptoms develop slowly and may include:  Increased thirst (polydipsia).  Increased hunger(polyphagia).  Increased urination (polyuria).  Increased urination during  the night (nocturia).  Unexplained weight loss.  Frequent infections that keep coming back (recurring).  Fatigue.  Weakness.  Vision changes, such as blurry vision.  Cuts or bruises that are slow to heal.  Tingling or numbness in the hands or feet.  Dark patches on the skin (acanthosis nigricans).  How is this diagnosed?  This condition is diagnosed based on your symptoms, your medical history, a physical exam, and your blood glucose level. Your blood glucose may be checked with one or more of the following blood tests:  A fasting blood glucose (FBG) test. You will not be allowed to eat (you will fast) for at least 8 hours before a blood sample is taken.  A random blood glucose test. This checks blood glucose at any time of day regardless of when you ate.  An A1c (hemoglobin A1c) blood test. This provides information about blood glucose control over the previous 2-3 months.  An oral glucose tolerance test (OGTT). This measures your blood glucose at two times: ? After fasting. This is your baseline blood glucose level. ? Two hours after drinking a beverage that contains glucose.  You may be diagnosed with type 2 diabetes if:  Your FBG level is 126 mg/dL (7.0 mmol/L) or higher.  Your random blood glucose level is 200 mg/dL (11.1 mmol/L) or higher.  Your A1c level is 6.5% or higher.  Your OGGT result is higher than 200 mg/dL (11.1 mmol/L).  These blood tests may be repeated to confirm your diagnosis. How is this treated?  Your treatment may be managed by a specialist called an endocrinologist. Type 2 diabetes may be treated  by following instructions from your health care provider about:  Making diet and lifestyle changes. This may include: ? Following an individualized nutrition plan that is developed by a diet and nutrition specialist (registered dietitian). ? Exercising regularly. ? Finding ways to manage stress.  Checking your blood glucose level as often as  directed.  Taking diabetes medicines or insulin daily. This helps to keep your blood glucose levels in the healthy range. ? If you use insulin, you may need to adjust the dosage depending on how physically active you are and what foods you eat. Your health care provider will tell you how to adjust your dosage.  Taking medicines to help prevent complications from diabetes, such as: ? Aspirin. ? Medicine to lower cholesterol. ? Medicine to control blood pressure.  Your health care provider will set individualized treatment goals for you. Your goals will be based on your age, other medical conditions you have, and how you respond to diabetes treatment. Generally, the goal of treatment is to maintain the following blood glucose levels:  Before meals (preprandial): 80-130 mg/dL (4.4-7.2 mmol/L).  After meals (postprandial): below 180 mg/dL (10 mmol/L).  A1c level: less than 7%.  Follow these instructions at home: Questions to [place] Provider Consider asking the following questions:  Do I need to meet with a diabetes educator?  Where can I find a support group for people with diabetes?  What equipment will I need to manage my diabetes at home?  What diabetes medicines do I need, and when should I take them?  How often do I need to check my blood glucose?  What number can I call if I have questions?  When is my next appointment?  General instructions  Take over-the-counter and prescription medicines only as told by your health care provider.  Keep all follow-up visits as told by your health care provider. This is important.  For more information about diabetes, visit: ? American Diabetes Association (ADA): www.diabetes.org ? American Association of Diabetes Educators (AADE): www.diabeteseducator.org/patient-resources Contact a health care provider if:  Your blood glucose is at or above 240 mg/dL (13.3 mmol/L) for 2 days in a row.  You have been sick or have had  a fever for 2 days or longer and you are not getting better.  You have any of the following problems for more than 6 hours: ? You cannot eat or drink. ? You have nausea and vomiting. ? You have diarrhea. Get help right away if:  Your blood glucose is lower than 54 mg/dL (3.0 mmol/L).  You become confused or you have trouble thinking clearly.  You have difficulty breathing.  You have moderate or large ketone levels in your urine. This information is not intended to replace advice given to you by your health care provider. Make sure you discuss any questions you have with your health care provider. Document Released: 12/07/2005 Document Revised: 05/14/2016 Document Reviewed: 01/10/2016 Elsevier Interactive Patient Education  [employer].

## 2018-04-04 NOTE — Progress Notes (Signed)
HPI:                                                                Bonnie [name] is a 67 y.o. female who presents to [facility]: [facility] today for diabetes  Pleasant 67 yo F with CKD, HTN, obesity, hx of renal carcinoma presents with newly diagnosed with Type 2 diabetes (A1c 6.6.). Denies polydipsia, polyuria, polyphagia. Denies blurred vision or vision change. Denies extremity pain, altered sensation and paresthesias.  Denies ulcers/wounds on feet.  No flowsheet data found.    Past Medical History:  Diagnosis Date  . Abnormal mammogram of right breast 01/20/2018  . Benign cyst of right breast 01/26/2018  . Chronic kidney disease, stage 3a ([place]) 12/31/2017  . Chronic low back pain   . Colon polyps   . Depression   . Fatty liver   . Hypertension   . Melanoma in situ of right upper extremity ([place])   . Osteoarthritis of lumbar spine   . Renal cancer ([place])   . Skin cancer    Past Surgical History:  Procedure Laterality Date  . ABDOMINAL HYSTERECTOMY    . BILE DUCT STENT PLACEMENT    . CHOLECYSTECTOMY    . LUMBAR FUSION    . MOHS SURGERY     x 11  . PARTIAL NEPHRECTOMY     Social History   Tobacco Use  . Smoking status: Former Smoker    Packs/day: 1.00    Years: 40.00    Pack years: 40.00    Types: Cigarettes    Last attempt to quit: 12/21/2010    Years since quitting: 7.2  . Smokeless tobacco: Never Used  Substance Use Topics  . Alcohol use: Yes    Alcohol/week: 0.6 - 1.2 oz    Types: 1 - 2 Standard drinks or equivalent per week   family history includes Alcohol abuse in her sister, sister, son, and son; Depression in her mother, sister, and sister; Heart attack in her father; Hyperlipidemia in her father and mother; Hypertension in her father and mother.    ROS: negative except as noted in the HPI  Medications: Current Outpatient Medications  Medication Sig Dispense Refill  . allopurinol (ZYLOPRIM) 100 MG tablet Take 1  tablet (100 mg total) by mouth daily. 90 tablet 1  . buPROPion (WELLBUTRIN SR) 150 MG 12 hr tablet Take 1 tablet (150 mg total) by mouth daily. 90 tablet 1  . Cholecalciferol (VITAMIN D3) 2000 units TABS Take 1 tablet by mouth daily.    . enalapril (VASOTEC) 10 MG tablet Take 1 tablet (10 mg total) by mouth daily. 90 tablet 1  . HYDROcodone-acetaminophen (NORCO/VICODIN) 5-325 MG tablet Take 1 tablet by mouth every 8 (eight) hours as needed for severe pain. 60 tablet 0  . Liraglutide -Weight Management (SAXENDA) 18 MG/3ML SOPN Inject 3 mg into the skin daily. 0.6 mg inj subcut daily for 1 week, then incr by 0.6 mg weekly until reaching 3 mg injected subcut daily 5 pen 0  . Multiple Vitamin tablet Take 1 tablet by mouth.    . Omega-3 Fatty Acids (FISH OIL) 1000 MG CAPS Take by mouth.    . senna-docusate (SENOKOT-S) 8.6-50 MG tablet Take 2 tablets by mouth 2 (two)  times daily. When taking hydrocodone 60 tablet 5  . simvastatin (ZOCOR) 20 MG tablet Take 1 tablet (20 mg total) by mouth at bedtime. 90 tablet 1  . terbinafine (LAMISIL) 250 MG tablet Take 1 tablet (250 mg total) by mouth daily. 90 tablet 2  . traZODone (DESYREL) 50 MG tablet Take 1 tablet (50 mg total) by mouth at bedtime as needed. 90 tablet 1  . triamterene-hydrochlorothiazide (MAXZIDE-25) 37.5-25 MG tablet Take 1 tablet by mouth daily. 90 tablet 1  . venlafaxine XR (EFFEXOR-XR) 75 MG 24 hr capsule Take 1 capsule (75 mg total) by mouth daily. 90 capsule 1  . atorvastatin (LIPITOR) 40 MG tablet Take 1 tablet (40 mg total) by mouth daily. 90 tablet 3  . blood glucose meter kit and supplies KIT Check morning fasting blood glucose and up to four times daily as directed. 1 each 0  . metFORMIN (GLUCOPHAGE) 500 MG tablet Take 1 tablet (500 mg total) by mouth 2 (two) times daily with a meal. 180 tablet 1  . metFORMIN (GLUCOPHAGE) 500 MG tablet Take 1 tablet (500 mg total) by mouth 2 (two) times daily with a meal. 60 tablet 0   No current  facility-administered medications for this visit.    Allergies  Allergen Reactions  . Latex Itching  . Pneumococcal Polysaccharide Vaccine Swelling and Nausea And Vomiting  . Tape Other (See Comments) and Swelling    Burning sensation  . Tramadol Other (See Comments)    Heart fluttering       Objective:  BP (!) 150/87   Pulse 84  Gen:  alert, not ill-appearing, no distress, appropriate for age, obese female HEENT: head normocephalic without obvious abnormality, conjunctiva and cornea clear, trachea midline Pulm: Normal work of breathing, normal phonation Neuro: alert and oriented x 3, no tremor MSK: extremities atraumatic, normal gait and station Skin: intact, no rashes on exposed skin, no jaundice, no cyanosis Psych: well-groomed, cooperative, good eye contact, euthymic mood, affect mood-congruent, speech is articulate, and thought processes clear and goal-directed  Diabetic Foot Exam - Simple   Simple Foot Form Diabetic Foot exam was performed with the following findings:  Yes 04/04/2018 10:31 AM  Visual Inspection Sensation Testing Pulse Check Comments      No results found for this or any previous visit (from the past 72 hour(s)). No results found.    Assessment and Plan: 67 y.o. female with   New diagnosis of Type 2 diabetes - discussed pathophysiology and treatment of disease in detail - foot exam performed today, wnl - Saxenda was denied for weight loss, so we will start her on Ozempic in addition to low-dose Metformin. Counseled on monitoring BG, especially for hypoglycemia - immunizations UTD - she is already on an ACE for CKD and statin for HLD. We will switch to Atorvastatin to get her to goal LDL <70 - referred to diabetic educator - she will go to [facility] for diabetic eye exam  Type 2 diabetes mellitus with hyperglycemia, without long-term current use of insulin ([place]) - Plan: metFORMIN (GLUCOPHAGE) 500 MG tablet, Ambulatory referral to diabetic  education, blood glucose meter kit and supplies KIT, COMPLETE METABOLIC PANEL WITH GFR, Lipid Panel w/reflex Direct LDL, Hemoglobin A1c, atorvastatin (LIPITOR) 40 MG tablet, Semaglutide (OZEMPIC) 0.25 or 0.5 MG/DOSE SOPN  Dyslipidemia associated with type 2 diabetes mellitus ([place]) - Plan: atorvastatin (LIPITOR) 40 MG tablet  Morbid obesity ([place]) - Plan: DISCONTINUED: Liraglutide -Weight Management (SAXENDA) 18 MG/3ML SOPN  Class 3 severe obesity due to  excess calories with serious comorbidity and body mass index (BMI) of 40.0 to 44.9 in adult [facility]) - Plan: DISCONTINUED: Liraglutide -Weight Management (SAXENDA) 18 MG/3ML SOPN  History of renal cell carcinoma - Plan: Ambulatory referral to Urology  Encounter for medication monitoring - Plan: COMPLETE METABOLIC PANEL WITH GFR, Lipid Panel w/reflex Direct LDL, Hemoglobin A1c  History of melanoma - followed by [facility] derm [id number]   Patient education and anticipatory guidance given Patient agrees with treatment plan Follow-up in 3 months or sooner as needed if symptoms worsen or fail to improve  I spent 25 minutes with this patient, greater than 50% was face-to-face time counseling regarding the above diagnoses  CDarlyne RussianPA-C

## 2018-04-05 ENCOUNTER — Encounter: Payer: Self-pay | Admitting: Physician Assistant

## 2018-04-13 NOTE — Telephone Encounter (Signed)
Spoke with Colette at [employer] appeal department and Orthovisc was denied since patients plan does not cover any type of injectable medication.

## 2018-04-19 ENCOUNTER — Encounter: Payer: Self-pay | Admitting: Sports Medicine

## 2018-04-27 ENCOUNTER — Encounter: Payer: Self-pay | Admitting: Physician Assistant

## 2018-05-02 ENCOUNTER — Other Ambulatory Visit: Payer: Self-pay | Admitting: Physician Assistant

## 2018-05-03 ENCOUNTER — Other Ambulatory Visit: Payer: Self-pay

## 2018-05-03 ENCOUNTER — Encounter: Payer: Self-pay | Admitting: Physician Assistant

## 2018-05-03 DIAGNOSIS — M150 Primary generalized (osteo)arthritis: Secondary | ICD-10-CM

## 2018-05-03 DIAGNOSIS — M159 Polyosteoarthritis, unspecified: Secondary | ICD-10-CM

## 2018-05-03 DIAGNOSIS — Z79891 Long term (current) use of opiate analgesic: Secondary | ICD-10-CM

## 2018-05-03 NOTE — Telephone Encounter (Signed)
Pt is requesting a refill of hydrocodone. -EH/RMA

## 2018-05-04 MED ORDER — HYDROCODONE-ACETAMINOPHEN 5-325 MG PO TABS: 1.0000 | ORAL_TABLET | Freq: Three times a day (TID) | ORAL | 0 refills | Status: DC | PRN

## 2018-07-04 ENCOUNTER — Ambulatory Visit: Payer: Managed Care, Other (non HMO) | Admitting: Physician Assistant

## 2018-07-05 ENCOUNTER — Ambulatory Visit: Payer: Managed Care, Other (non HMO) | Admitting: Physician Assistant

## 2018-07-06 ENCOUNTER — Ambulatory Visit: Payer: Managed Care, Other (non HMO) | Admitting: Physician Assistant

## 2018-07-06 ENCOUNTER — Encounter: Payer: Self-pay | Admitting: Physician Assistant

## 2018-07-06 VITALS — BP 126/81 | HR 86 | Wt 289.0 lb

## 2018-07-06 DIAGNOSIS — E119 Type 2 diabetes mellitus without complications: Secondary | ICD-10-CM

## 2018-07-06 DIAGNOSIS — Z1159 Encounter for screening for other viral diseases: Secondary | ICD-10-CM

## 2018-07-06 DIAGNOSIS — M150 Primary generalized (osteo)arthritis: Secondary | ICD-10-CM | POA: Diagnosis not present

## 2018-07-06 DIAGNOSIS — B351 Tinea unguium: Secondary | ICD-10-CM

## 2018-07-06 DIAGNOSIS — Z122 Encounter for screening for malignant neoplasm of respiratory organs: Secondary | ICD-10-CM

## 2018-07-06 DIAGNOSIS — E1165 Type 2 diabetes mellitus with hyperglycemia: Secondary | ICD-10-CM | POA: Diagnosis not present

## 2018-07-06 DIAGNOSIS — Z79891 Long term (current) use of opiate analgesic: Secondary | ICD-10-CM | POA: Diagnosis not present

## 2018-07-06 DIAGNOSIS — Z87891 Personal history of nicotine dependence: Secondary | ICD-10-CM

## 2018-07-06 DIAGNOSIS — Z87898 Personal history of other specified conditions: Secondary | ICD-10-CM | POA: Insufficient documentation

## 2018-07-06 DIAGNOSIS — Z0100 Encounter for examination of eyes and vision without abnormal findings: Secondary | ICD-10-CM

## 2018-07-06 DIAGNOSIS — Z23 Encounter for immunization: Secondary | ICD-10-CM | POA: Diagnosis not present

## 2018-07-06 DIAGNOSIS — Z5181 Encounter for therapeutic drug level monitoring: Secondary | ICD-10-CM

## 2018-07-06 DIAGNOSIS — M159 Polyosteoarthritis, unspecified: Secondary | ICD-10-CM

## 2018-07-06 LAB — POCT GLYCOSYLATED HEMOGLOBIN (HGB A1C): HbA1c, POC (prediabetic range): 5.9 % (ref 5.7–6.4)

## 2018-07-06 MED ORDER — HYDROCODONE-ACETAMINOPHEN 5-325 MG PO TABS: 1.0000 | ORAL_TABLET | Freq: Four times a day (QID) | ORAL | 0 refills | Status: DC | PRN

## 2018-07-06 MED ORDER — SEMAGLUTIDE (1 MG/DOSE) 2 MG/1.5ML ~~LOC~~ SOPN: 1.0000 mg | PEN_INJECTOR | SUBCUTANEOUS | 3 refills | Status: DC

## 2018-07-06 NOTE — Patient Instructions (Signed)
Diabetes Preventive Care: - annual foot exam  - annual dilated eye exam with an eye doctor - self foot exams at least weekly - pneumonia vaccine once (booster in 5 years and at age 67) - annual influenza vaccine - twice yearly dental cleanings and yearly exam - goal blood pressure <140/90, ideally <130/80 - LDL cholesterol <70 - A1C <7.0 - body mass index (BMI) <25.0 - follow-up every 3 months if your A1C is not at goal - follow-up every 6 months if diabetes is well controlled  

## 2018-07-06 NOTE — Progress Notes (Signed)
HPI:                                                                Bonnie [name] is a 67 y.o. female who presents to [facility]: [place] today for medication management  DMII: taking Ozempic 0.5 mg weekly. Compliant with medications. She was intolerant to Metformin (had malaise and fatigue). Denies polydipsia, polyuria, polyphagia. Denies blurred vision or vision change. Denies extremity pain, altered sensation and paresthesias.  Denies ulcers/wounds on feet. Hx of DKA/HHS: no Diabetes associated symptoms: onychomycosis Hypoglycemia frequency: none  Onychomycosis: significantly improved with 3 months of oral Terbinafine. Would like to extend treatment to try to clear infection of great toe nail  HTN: taking Maxzide 37.5-25 and Enalapril 10 mg daily. Compliant with medications. Denies vision change, headache, chest pain with exertion, orthopnea, lightheadedness, syncope and edema. Risk factors include: DM2, obesity, postmenopausal  OA: she takes low-dose Norco prn for joint pain due to osteoarthritis. Requesting refill today   Depression screen [facility] 2/9 12/30/2017  Decreased Interest 0  Down, Depressed, Hopeless 0  PHQ - 2 Score 0  Altered sleeping 1  Tired, decreased energy 1  Change in appetite 1  Feeling bad or failure about yourself  0  Trouble concentrating 0  Moving slowly or fidgety/restless 0  Suicidal thoughts 0  PHQ-9 Score 3  Difficult doing work/chores Not difficult at all    No flowsheet data found.    Past Medical History:  Diagnosis Date  . Abnormal mammogram of right breast 01/20/2018  . Benign cyst of right breast 01/26/2018  . Chronic kidney disease, stage 3a ([place]) 12/31/2017  . Chronic low back pain   . Colon polyps   . Depression   . Fatty liver   . Hypertension   . Melanoma in situ of right upper extremity ([place])   . Osteoarthritis of lumbar spine   . Renal cancer ([place])   . Skin cancer    Past Surgical  History:  Procedure Laterality Date  . ABDOMINAL HYSTERECTOMY    . BILE DUCT STENT PLACEMENT    . CHOLECYSTECTOMY    . LUMBAR FUSION    . MOHS SURGERY     x 11  . PARTIAL NEPHRECTOMY     Social History   Tobacco Use  . Smoking status: Former Smoker    Packs/day: 1.00    Years: 40.00    Pack years: 40.00    Types: Cigarettes    Last attempt to quit: 12/21/2010    Years since quitting: 7.5  . Smokeless tobacco: Never Used  Substance Use Topics  . Alcohol use: Yes    Alcohol/week: 0.6 - 1.2 oz    Types: 1 - 2 Standard drinks or equivalent per week   family history includes Alcohol abuse in her sister, sister, son, and son; Depression in her mother, sister, and sister; Heart attack in her father; Hyperlipidemia in her father and mother; Hypertension in her father and mother.    ROS: negative except as noted in the HPI  Medications: Current Outpatient Medications  Medication Sig Dispense Refill  . allopurinol (ZYLOPRIM) 100 MG tablet Take 1 tablet (100 mg total) by mouth daily. 90 tablet 1  . atorvastatin (LIPITOR) 40 MG tablet Take 1  tablet (40 mg total) by mouth daily. 90 tablet 3  . blood glucose meter kit and supplies KIT Check morning fasting blood glucose and up to four times daily as directed. 1 each 0  . buPROPion (WELLBUTRIN SR) 150 MG 12 hr tablet Take 1 tablet (150 mg total) by mouth daily. 90 tablet 1  . Cholecalciferol (VITAMIN D3) 2000 units TABS Take 1 tablet by mouth daily.    . enalapril (VASOTEC) 10 MG tablet Take 1 tablet (10 mg total) by mouth daily. 90 tablet 1  . HYDROcodone-acetaminophen (NORCO/VICODIN) 5-325 MG tablet Take 1 tablet by mouth every 8 (eight) hours as needed for severe pain. 60 tablet 0  . Multiple Vitamin tablet Take 1 tablet by mouth.    . Omega-3 Fatty Acids (FISH OIL) 1000 MG CAPS Take by mouth.    . Semaglutide (OZEMPIC) 0.25 or 0.5 MG/DOSE SOPN Inject 0.25 mg into the skin once a week. 0.25 mg subQ once weekly for 4 weeks, then increase  to 0.5 mg once weekly 4 pen 3  . senna-docusate (SENOKOT-S) 8.6-50 MG tablet Take 2 tablets by mouth 2 (two) times daily. When taking hydrocodone 60 tablet 5  . traZODone (DESYREL) 50 MG tablet Take 1 tablet (50 mg total) by mouth at bedtime as needed. 90 tablet 1  . triamterene-hydrochlorothiazide (MAXZIDE-25) 37.5-25 MG tablet Take 1 tablet by mouth daily. 90 tablet 1  . venlafaxine XR (EFFEXOR-XR) 75 MG 24 hr capsule Take 1 capsule (75 mg total) by mouth daily. 90 capsule 1  . terbinafine (LAMISIL) 250 MG tablet Take 1 tablet (250 mg total) by mouth daily. 90 tablet 2   No current facility-administered medications for this visit.    Allergies  Allergen Reactions  . Latex Itching  . Pneumococcal Polysaccharide Vaccine Swelling and Nausea And Vomiting  . Tape Other (See Comments) and Swelling    Burning sensation  . Tramadol Other (See Comments)    Heart fluttering       Objective:  BP 126/81   Pulse 86   Wt 289 lb (131.1 kg)   BMI 41.47 kg/m  Gen:  alert, not ill-appearing, no distress, appropriate for age, obese female HEENT: head normocephalic without obvious abnormality, conjunctiva and cornea clear, trachea midline Pulm: Normal work of breathing, normal phonation, clear to auscultation bilaterally, no wheezes, rales or rhonchi CV: Normal rate, regular rhythm, s1 and s2 distinct, no murmurs, clicks or rubs  Neuro: alert and oriented x 3, no tremor MSK: extremities atraumatic, normal gait and station Skin: onychomycosis of right first and fifth toenails Psych: well-groomed, cooperative, good eye contact, euthymic mood, affect mood-congruent, speech is articulate, and thought processes clear and goal-directed    No results found for this or any previous visit (from the past 72 hour(s)). No results found.    Assessment and Plan: 67 y.o. female with   Type 2 diabetes mellitus with hyperglycemia, without long-term current use of insulin (HCC) - Plan: POCT glycosylated  hemoglobin (Hb A1C), Semaglutide (OZEMPIC) 1 MG/DOSE SOPN  Chronic use of opiate for therapeutic purpose - Plan: HYDROcodone-acetaminophen (NORCO/VICODIN) 5-325 MG tablet  Primary osteoarthritis involving multiple joints - Plan: HYDROcodone-acetaminophen (NORCO/VICODIN) 5-325 MG tablet  Onychomycosis  Encounter for hepatitis C screening test for low risk patient - Plan: Hepatitis C antibody  Encounter for medication monitoring - Plan: Hepatic function panel  Diabetic eye exam ([place]) - referral placed 07/06/18 - Plan: Ambulatory referral to Ophthalmology  Encounter for screening for lung cancer - Plan: CT CHEST NODULE  FOLLOW UP LOW DOSE W/O  History of multiple pulmonary nodules - Plan: CT CHEST NODULE FOLLOW UP LOW DOSE W/O  Former heavy tobacco smoker - Plan: CT CHEST NODULE FOLLOW UP LOW DOSE W/O  Need for 23-polyvalent pneumococcal polysaccharide vaccine - Plan: Pneumococcal polysaccharide vaccine 23-valent greater than or equal to 2yo subcutaneous/IM  Type 2 diabetes Lab Results  Component Value Date   HGBA1C 5.9 07/06/2018  - well controlled - intolerant to Metformin - increasing Ozempic to 1 mg to promote weight loss - referral placed to Ophthalmology - cont ACE - cont Atorvastatin 40 mg - Pneumovax   HTN associated with diabetes BP Readings from Last 3 Encounters:  07/06/18 126/81  04/04/18 (!) 150/87  03/31/18 130/78  - BP at goal <140/90 - cont Maxzide 37.5-25 and Enalapril 10 mg daily. - cont baby asa for primary prevention - counseled on therapeutic lifestyle changes  Onychomycosis - almost complete resolution with 12 weeks oral terbinafine. Checking liver function. Amenable to extending for another 4 weeks if liver function is normal  Chronic Pain Indication for chronic opioid: OA of multiple joints, renal disease Medication and dose: Norco 5-325 # pills per month: 30 Last UDS date:  Opioid Treatment Agreement signed (Y/N): Y Opioid Treatment Agreement  last reviewed with patient:  12/30/17 [facility] reviewed this encounter (include red flags):  last fill 05/04/18, no red flags  Personally reviewed HM. She is a candidate for lung cancer screening (40 packyear smoking history) Pneumovax given today, UTD on pneumococcal and Tdap vaccinations   Patient education and anticipatory guidance given Patient agrees with treatment plan Follow-up in 3 months or sooner as needed if symptoms worsen or fail to improve  Darlyne Russian PA-C

## 2018-07-07 ENCOUNTER — Other Ambulatory Visit: Payer: Self-pay | Admitting: Physician Assistant

## 2018-07-07 DIAGNOSIS — B351 Tinea unguium: Secondary | ICD-10-CM

## 2018-07-07 LAB — HEPATIC FUNCTION PANEL
AG Ratio: 1.6 (calc) (ref 1.0–2.5)
ALKALINE PHOSPHATASE (APISO): 107 U/L (ref 33–130)
ALT: 25 U/L (ref 6–29)
AST: 19 U/L (ref 10–35)
Albumin: 4.2 g/dL (ref 3.6–5.1)
BILIRUBIN DIRECT: 0.1 mg/dL (ref 0.0–0.2)
BILIRUBIN INDIRECT: 0.3 mg/dL (ref 0.2–1.2)
GLOBULIN: 2.6 g/dL (ref 1.9–3.7)
TOTAL PROTEIN: 6.8 g/dL (ref 6.1–8.1)
Total Bilirubin: 0.4 mg/dL (ref 0.2–1.2)

## 2018-07-07 LAB — HEPATITIS C ANTIBODY
HEP C AB: NONREACTIVE
SIGNAL TO CUT-OFF: 0.03 (ref <1.00)

## 2018-07-07 MED ORDER — TERBINAFINE HCL 250 MG PO TABS: 250.0000 mg | ORAL_TABLET | Freq: Every day | ORAL | 0 refills | Status: DC

## 2018-07-08 NOTE — Progress Notes (Signed)
Bonnie [name],  Your liver function looks great. I refilled your antifungal.  Also your hep c screening was negative. You do not need any additional testing.  Have a great weekend! [name]

## 2018-07-13 ENCOUNTER — Encounter: Payer: Self-pay | Admitting: Physician Assistant

## 2018-07-21 ENCOUNTER — Encounter: Payer: Self-pay | Admitting: Physician Assistant

## 2018-07-21 ENCOUNTER — Telehealth: Payer: Self-pay | Admitting: Physician Assistant

## 2018-07-21 MED ORDER — CELECOXIB 100 MG PO CAPS: 100.0000 mg | ORAL_CAPSULE | Freq: Two times a day (BID) | ORAL | 3 refills | Status: DC

## 2018-07-21 NOTE — Telephone Encounter (Signed)
Approvedtoday  CaseId:[id number];Status:Approved;Review Type:Prior Auth;Coverage Start Date:06/21/2018;Coverage End Date:07/21/2019; Pharmacy notified.

## 2018-07-25 ENCOUNTER — Telehealth: Payer: Self-pay | Admitting: Physician Assistant

## 2018-07-25 ENCOUNTER — Encounter: Payer: Self-pay | Admitting: Physician Assistant

## 2018-07-25 DIAGNOSIS — J432 Centrilobular emphysema: Secondary | ICD-10-CM

## 2018-07-25 DIAGNOSIS — N281 Cyst of kidney, acquired: Secondary | ICD-10-CM

## 2018-07-25 DIAGNOSIS — Z87898 Personal history of other specified conditions: Secondary | ICD-10-CM

## 2018-07-25 DIAGNOSIS — S32010A Wedge compression fracture of first lumbar vertebra, initial encounter for closed fracture: Secondary | ICD-10-CM | POA: Insufficient documentation

## 2018-07-25 DIAGNOSIS — Z9889 Other specified postprocedural states: Secondary | ICD-10-CM

## 2018-07-25 HISTORY — DX: Centrilobular emphysema: J43.2

## 2018-07-25 NOTE — Telephone Encounter (Signed)
Results for CT Lung cancer screening - few scattered micronodules  - mild centrilobular emphysema - left renal cyst - L1 compression fracture with mild retropulsion,   Recommendations 1. Repeat Chest CT in 1 year  2. Follow-up as needed for shortness of breath, coughing, wheezing. Come in for high-dose flu vaccine when available 3. Renal cyst does not require any additional imaging 4. Compression fx felt to be chronic and stable. However, recommend follow-up with Sports Medicine for any worsening back/leg pain, numbness or tingling in the lower extremities

## 2018-07-26 NOTE — Telephone Encounter (Signed)
Pt advised of results, verbalized understanding. No further questions.  

## 2018-07-26 NOTE — Telephone Encounter (Signed)
Pt does report she has had kidney cancer on her left kidney last year, so she is relieved the renal cyst is of no further concern. She is going to go ahead and schedule with urology to establish care here.

## 2018-07-28 ENCOUNTER — Encounter: Payer: Self-pay | Admitting: Sports Medicine

## 2018-07-28 ENCOUNTER — Ambulatory Visit: Payer: Managed Care, Other (non HMO) | Admitting: Sports Medicine

## 2018-07-28 VITALS — BP 106/72 | HR 85 | Ht 70.0 in | Wt 283.0 lb

## 2018-07-28 DIAGNOSIS — M170 Bilateral primary osteoarthritis of knee: Secondary | ICD-10-CM

## 2018-07-28 DIAGNOSIS — M47816 Spondylosis without myelopathy or radiculopathy, lumbar region: Secondary | ICD-10-CM

## 2018-07-28 DIAGNOSIS — Z23 Encounter for immunization: Secondary | ICD-10-CM | POA: Diagnosis not present

## 2018-07-28 NOTE — Progress Notes (Addendum)
Subjective:    CC: Recheck knees  HPI: This is a pleasant 67 year old female, we have been treating her for bilateral knee osteoarthritis, she responded well for a short period of time to bilateral steroid injections.  Unfortunately she had a rapid recurrence of pain.  We have been avoiding NSAIDs due to her renal insufficiency.  Ultimately she was placed on Celebrex which is appropriate, and she has had a fantastic response with near complete resolution of her pain.  She also has lumbar spinal stenosis, she had a good response to facet radio frequency ablation in [place] some years back, still awaiting scheduling at [place] for this.  I reviewed the past medical history, family history, social history, surgical history, and allergies today and no changes were needed.  Please see the problem list section below in epic for further details.  Past Medical History: Past Medical History:  Diagnosis Date  . Abnormal mammogram of right breast 01/20/2018  . Benign cyst of right breast 01/26/2018  . Centrilobular emphysema ([place]) 07/25/2018  . Chronic kidney disease, stage 3a ([place]) 12/31/2017  . Chronic low back pain   . Colon polyps   . Depression   . Fatty liver   . Hypertension   . Melanoma in situ of right upper extremity ([place])   . Osteoarthritis of lumbar spine   . Renal cancer ([place])   . Skin cancer    Past Surgical History: Past Surgical History:  Procedure Laterality Date  . ABDOMINAL HYSTERECTOMY    . BILE DUCT STENT PLACEMENT    . CHOLECYSTECTOMY    . LUMBAR FUSION    . MOHS SURGERY     x 11  . PARTIAL NEPHRECTOMY     Social History: Social History   Socioeconomic History  . Marital status: Married    Spouse name: Not on file  . Number of children: Not on file  . Years of education: Not on file  . Highest education level: Not on file  Occupational History  . Not on file  Social Needs  . Financial resource strain: Not on file  . Food insecurity:    Worry: Not  on file    Inability: Not on file  . Transportation needs:    Medical: Not on file    Non-medical: Not on file  Tobacco Use  . Smoking status: Former Smoker    Packs/day: 1.00    Years: 40.00    Pack years: 40.00    Types: Cigarettes    Last attempt to quit: 12/21/2010    Years since quitting: 7.6  . Smokeless tobacco: Never Used  Substance and Sexual Activity  . Alcohol use: Yes    Alcohol/week: 1.0 - 2.0 standard drinks    Types: 1 - 2 Standard drinks or equivalent per week  . Drug use: No  . Sexual activity: Yes    Birth control/protection: Surgical, None  Lifestyle  . Physical activity:    Days per week: Not on file    Minutes per session: Not on file  . Stress: Not on file  Relationships  . Social connections:    Talks on phone: Not on file    Gets together: Not on file    Attends religious service: Not on file    Active member of club or organization: Not on file    Attends meetings of clubs or organizations: Not on file    Relationship status: Not on file  Other Topics Concern  . Not on file  Social History Narrative  . Not on file   Family History: Family History  Problem Relation Age of Onset  . Depression Mother   . Hyperlipidemia Mother   . Hypertension Mother   . Heart attack Father   . Hyperlipidemia Father   . Hypertension Father   . Alcohol abuse Sister   . Depression Sister   . Alcohol abuse Son   . Alcohol abuse Son   . Alcohol abuse Sister   . Depression Sister    Allergies: Allergies  Allergen Reactions  . Latex Itching  . Metformin And Related Other (See Comments)    Malaise, fatigue  . Tape Other (See Comments) and Swelling    Burning sensation  . Tramadol Other (See Comments)    Heart fluttering   Medications: See med rec.  Review of Systems: No fevers, chills, night sweats, weight loss, chest pain, or shortness of breath.   Objective:    General: Well Developed, well nourished, and in no acute distress.  Neuro: Alert and  oriented x3, extra-ocular muscles intact, sensation grossly intact.  HEENT: Normocephalic, atraumatic, pupils equal round reactive to light, neck supple, no masses, no lymphadenopathy, thyroid nonpalpable.  Skin: Warm and dry, no rashes. Cardiac: Regular rate and rhythm, no murmurs rubs or gallops, no lower extremity edema.  Respiratory: Clear to auscultation bilaterally. Not using accessory muscles, speaking in full sentences.  Impression and Recommendations:    Primary osteoarthritis of both knees Good but short-lived response to bilateral knee injections, her insurance does not cover any form of injectable treatment such as Orthovisc. Celebrex has provided fantastic relief of all of her pain at only 100 mg twice a day. We can continue this for now.  Monitor renal function every 3 to 6 months.  Spondylosis of lumbar region without myelopathy or radiculopathy Still awaiting medial branch blocks and radiofrequency ablation of the bilateral L4-S1 facet joints.  Discussed with Dr. Jola Baptist with interventional radiology, we do need updated x-rays and an MRI before she gets her ablation.  ___________________________________________ Gwen Her. Dianah Field, M.D., ABFM., CAQSM. Primary Care and [place] Instructor of [employer] [facility] [employer]

## 2018-07-28 NOTE — Assessment & Plan Note (Signed)
Good but short-lived response to bilateral knee injections, her insurance does not cover any form of injectable treatment such as Orthovisc. Celebrex has provided fantastic relief of all of her pain at only 100 mg twice a day. We can continue this for now.  Monitor renal function every 3 to 6 months.

## 2018-07-28 NOTE — Assessment & Plan Note (Addendum)
Still awaiting medial branch blocks and radiofrequency ablation of the bilateral L4-S1 facet joints.  Discussed with Dr. Jola Baptist with interventional radiology, we do need updated x-rays and an MRI before she gets her ablation.

## 2018-07-29 NOTE — Addendum Note (Signed)
Addended by: Silverio Decamp on: 07/29/2018 01:28 PM   Modules accepted: Orders

## 2018-08-19 ENCOUNTER — Other Ambulatory Visit: Payer: Self-pay | Admitting: Physician Assistant

## 2018-08-19 DIAGNOSIS — M159 Polyosteoarthritis, unspecified: Secondary | ICD-10-CM

## 2018-08-19 DIAGNOSIS — M150 Primary generalized (osteo)arthritis: Secondary | ICD-10-CM

## 2018-08-19 DIAGNOSIS — Z79891 Long term (current) use of opiate analgesic: Secondary | ICD-10-CM

## 2018-08-19 DIAGNOSIS — B351 Tinea unguium: Secondary | ICD-10-CM

## 2018-08-23 ENCOUNTER — Encounter: Payer: Self-pay | Admitting: Physician Assistant

## 2018-08-23 ENCOUNTER — Ambulatory Visit (INDEPENDENT_AMBULATORY_CARE_PROVIDER_SITE_OTHER): Payer: Managed Care, Other (non HMO)

## 2018-08-23 DIAGNOSIS — M150 Primary generalized (osteo)arthritis: Secondary | ICD-10-CM

## 2018-08-23 DIAGNOSIS — Z79891 Long term (current) use of opiate analgesic: Secondary | ICD-10-CM

## 2018-08-23 DIAGNOSIS — M47816 Spondylosis without myelopathy or radiculopathy, lumbar region: Secondary | ICD-10-CM | POA: Diagnosis not present

## 2018-08-23 DIAGNOSIS — B351 Tinea unguium: Secondary | ICD-10-CM

## 2018-08-23 DIAGNOSIS — M159 Polyosteoarthritis, unspecified: Secondary | ICD-10-CM

## 2018-08-23 MED ORDER — HYDROCODONE-ACETAMINOPHEN 5-325 MG PO TABS: 1.0000 | ORAL_TABLET | Freq: Four times a day (QID) | ORAL | 0 refills | Status: DC | PRN

## 2018-08-23 MED ORDER — TERBINAFINE HCL 250 MG PO TABS: 250.0000 mg | ORAL_TABLET | Freq: Every day | ORAL | 0 refills | Status: DC

## 2018-08-31 ENCOUNTER — Telehealth: Payer: Self-pay | Admitting: Physician Assistant

## 2018-08-31 NOTE — Telephone Encounter (Signed)
Received fax from Covermymeds that Ozempic requires a PA. Information has been sent to the insurance company. Awaiting determination.   

## 2018-08-31 NOTE — Telephone Encounter (Signed)
Message from Plan 08/31/2018 CaseId:[id number];Status:Approved;Review Type:Prior Auth;Coverage Start Date:08/01/2018;Coverage End Date:08/30/2021;  Pharmacy aware,-hsm.

## 2018-09-05 ENCOUNTER — Ambulatory Visit: Payer: Self-pay | Admitting: Podiatry

## 2018-09-12 ENCOUNTER — Telehealth: Payer: Self-pay

## 2018-09-12 ENCOUNTER — Ambulatory Visit: Payer: Managed Care, Other (non HMO) | Admitting: Podiatry

## 2018-09-12 ENCOUNTER — Encounter: Payer: Self-pay | Admitting: Podiatry

## 2018-09-12 VITALS — BP 136/85 | HR 82 | Ht 70.0 in | Wt 283.0 lb

## 2018-09-12 DIAGNOSIS — M79671 Pain in right foot: Secondary | ICD-10-CM

## 2018-09-12 DIAGNOSIS — L600 Ingrowing nail: Secondary | ICD-10-CM

## 2018-09-12 DIAGNOSIS — B351 Tinea unguium: Secondary | ICD-10-CM

## 2018-09-12 DIAGNOSIS — M79672 Pain in left foot: Secondary | ICD-10-CM | POA: Diagnosis not present

## 2018-09-12 NOTE — Progress Notes (Signed)
SUBJECTIVE: 67 y.o. year old female presents complaining of painful ingrown nail and fungal nail Been treated with oral medication and still having nail issues. Patient request ingrown nail to be fixed on right great toe.  Review of Systems  Constitutional: Negative.   HENT: Negative.   Eyes: Negative.   Respiratory: Negative.   Cardiovascular: Negative.   Genitourinary: Negative.   Musculoskeletal: Positive for back pain.       Positive for Spinal stenosis.  Skin: Negative.      OBJECTIVE: DERMATOLOGIC EXAMINATION: Mycotic nails x 10. Ingrown hallucal nails both great toes.  VASCULAR EXAMINATION OF LOWER LIMBS: All pedal pulses are palpable with normal pulsation.  Capillary Filling times within 3 seconds in all digits.  No edema or erythema noted. Temperature gradient from tibial crest to dorsum of foot is within normal bilateral.  NEUROLOGIC EXAMINATION OF THE LOWER LIMBS: All epicritic and tactile sensations grossly intact. Sharp and Dull discriminatory sensations at the plantar ball of hallux is intact bilateral.   MUSCULOSKELETAL EXAMINATION: No gross deformities.  ASSESSMENT: Onychocryptosis both great toes symptomatic. Onychomycosis x 10.  PLAN: Reviewed findings and available treatment options. As per request ingrown nail surgery done on right great toe both borders under local anesthetics. Procedure done: Phenol and Alcohol matrixectomy right great toe both borders. Local used: 50/50 mixture 0.5% Marcaine plain and 1% Lidocaine with Epi. Patient tolerated well. Post op home care instruction with supply dispensed. May benefit from antifungal foot soak after finishing her oral treatment to ensure satisfactory antifungal treatment.  Return in one week.

## 2018-09-12 NOTE — Telephone Encounter (Signed)
Reminder to place order for medicated soak.

## 2018-09-12 NOTE — Patient Instructions (Signed)
Ingrown nail surgery was done on right great toe both borders.. Follow soaking instruction.  Some redness and drainage is expected. Call the office if the area gets feverish with increased redness and drainage. Return in one week.

## 2018-09-19 ENCOUNTER — Encounter: Payer: Managed Care, Other (non HMO) | Admitting: Podiatry

## 2018-09-20 ENCOUNTER — Encounter: Payer: Self-pay | Admitting: Podiatry

## 2018-09-20 ENCOUNTER — Ambulatory Visit (INDEPENDENT_AMBULATORY_CARE_PROVIDER_SITE_OTHER): Payer: Managed Care, Other (non HMO) | Admitting: Podiatry

## 2018-09-20 DIAGNOSIS — L600 Ingrowing nail: Secondary | ICD-10-CM

## 2018-09-20 DIAGNOSIS — M79672 Pain in left foot: Secondary | ICD-10-CM | POA: Diagnosis not present

## 2018-09-20 DIAGNOSIS — M79671 Pain in right foot: Secondary | ICD-10-CM

## 2018-09-20 NOTE — Patient Instructions (Signed)
Ingrown nail surgery was done on left great toe both borders. Follow soaking instruction.  Some redness and drainage is expected. Call the office if the area gets feverish with increased redness and drainage.

## 2018-09-20 NOTE — Progress Notes (Signed)
Subjective: 67 y.o. year old female patient presents for one week post op on right great toe nail surgery. Patient denies any discomfort with the toe. Patient request to have the left great toe nail surgery.  Objective: Dermatologic: Well healed post op nail right great toe. Thick dystrophic and yellow Vascular: Pedal pulses are all palpable. Orthopedic: Contracted lesser digits  Neurologic: All epicritic and tactile sensations grossly intact.  Assessment: Dystrophic mycotic ingrown nail left great toe both borders. Well healed right great toe nail  Treatment: As per request ingrown nail surgery done on left great toe. Affected left great toe was anesthetized with total 31ml mixture of 50/50 0.5% Marcaine plain and 1% Xylocaine plain. Affected both nail border was reflected with a nail elevator and excised with nail nipper. Proximal nail matrix tissue was cauterized with Phenol soaked cotton applicator x 4 and neutralized with Alcohol soaked cotton applicator. The wound was dressed with Amerigel ointment dressing. Home care instructions and supply dispensed.  Return in 1 week for follow up.

## 2018-10-03 ENCOUNTER — Encounter: Payer: Self-pay | Admitting: Physician Assistant

## 2018-10-03 DIAGNOSIS — Z79891 Long term (current) use of opiate analgesic: Secondary | ICD-10-CM

## 2018-10-03 DIAGNOSIS — M159 Polyosteoarthritis, unspecified: Secondary | ICD-10-CM

## 2018-10-03 DIAGNOSIS — M150 Primary generalized (osteo)arthritis: Secondary | ICD-10-CM

## 2018-10-03 NOTE — Telephone Encounter (Signed)
To PCP

## 2018-10-04 MED ORDER — HYDROCODONE-ACETAMINOPHEN 5-325 MG PO TABS: 1.0000 | ORAL_TABLET | Freq: Four times a day (QID) | ORAL | 0 refills | Status: DC | PRN

## 2018-10-06 ENCOUNTER — Ambulatory Visit: Payer: Managed Care, Other (non HMO) | Admitting: Physician Assistant

## 2018-10-06 ENCOUNTER — Encounter: Payer: Self-pay | Admitting: Physician Assistant

## 2018-10-06 VITALS — BP 107/71 | HR 97 | Wt 287.0 lb

## 2018-10-06 DIAGNOSIS — I10 Essential (primary) hypertension: Secondary | ICD-10-CM | POA: Diagnosis not present

## 2018-10-06 DIAGNOSIS — N951 Menopausal and female climacteric states: Secondary | ICD-10-CM

## 2018-10-06 DIAGNOSIS — Z79899 Other long term (current) drug therapy: Secondary | ICD-10-CM

## 2018-10-06 DIAGNOSIS — M47816 Spondylosis without myelopathy or radiculopathy, lumbar region: Secondary | ICD-10-CM

## 2018-10-06 DIAGNOSIS — M1A0710 Idiopathic chronic gout, right ankle and foot, without tophus (tophi): Secondary | ICD-10-CM

## 2018-10-06 DIAGNOSIS — Z23 Encounter for immunization: Secondary | ICD-10-CM

## 2018-10-06 DIAGNOSIS — E1165 Type 2 diabetes mellitus with hyperglycemia: Secondary | ICD-10-CM

## 2018-10-06 DIAGNOSIS — F3342 Major depressive disorder, recurrent, in full remission: Secondary | ICD-10-CM

## 2018-10-06 DIAGNOSIS — E1169 Type 2 diabetes mellitus with other specified complication: Secondary | ICD-10-CM

## 2018-10-06 DIAGNOSIS — E785 Hyperlipidemia, unspecified: Secondary | ICD-10-CM

## 2018-10-06 DIAGNOSIS — M25552 Pain in left hip: Secondary | ICD-10-CM

## 2018-10-06 DIAGNOSIS — M170 Bilateral primary osteoarthritis of knee: Secondary | ICD-10-CM

## 2018-10-06 MED ORDER — TRIAMTERENE-HCTZ 37.5-25 MG PO TABS: 1.0000 | ORAL_TABLET | Freq: Every day | ORAL | 1 refills | Status: DC

## 2018-10-06 MED ORDER — BUPROPION HCL ER (SR) 200 MG PO TB12: 200.0000 mg | ORAL_TABLET | Freq: Every day | ORAL | 1 refills | Status: DC

## 2018-10-06 MED ORDER — ALLOPURINOL 100 MG PO TABS: 100.0000 mg | ORAL_TABLET | Freq: Every day | ORAL | 1 refills | Status: DC

## 2018-10-06 MED ORDER — CELECOXIB 200 MG PO CAPS: 200.0000 mg | ORAL_CAPSULE | Freq: Two times a day (BID) | ORAL | 3 refills | Status: DC

## 2018-10-06 MED ORDER — ATORVASTATIN CALCIUM 40 MG PO TABS: 40.0000 mg | ORAL_TABLET | Freq: Every day | ORAL | 3 refills | Status: DC

## 2018-10-06 NOTE — Progress Notes (Signed)
HPI:                                                                Bonnie [name] is a 67 y.o. female who presents to [facility] today for medication mgmt  She recently had ingrown toenail removal bilaterally and is using an antifungal wash  She is following with Dr. Gloriann Loan for her history of renal carcinoma  She is following with Dr. Michela Pitcher for OSA and emphysema. He is also following a pulmonary nodule.   She has a history of colon polyps and is on a Q5y screening schedule. States she is due in 2020.  She has an appt in November with Dr. Larose Kells for her diabetic eye xam  She is experiencing worsening left hip pain. Pain is moderate, persistent, interfering with daily activities. Celebrex 100 mg bid is not relieving the pain. She has needed to use her Norco more often.  Depression screen [facility] 2/9 10/06/2018 12/30/2017  Decreased Interest 0 0  Down, Depressed, Hopeless 0 0  PHQ - 2 Score 0 0  Altered sleeping 1 1  Tired, decreased energy 0 1  Change in appetite 1 1  Feeling bad or failure about yourself  0 0  Trouble concentrating 0 0  Moving slowly or fidgety/restless 0 0  Suicidal thoughts 0 0  PHQ-9 Score 2 3  Difficult doing work/chores - Not difficult at all    GAD 7 : Generalized Anxiety Score 10/06/2018  Nervous, Anxious, on Edge 0  Control/stop worrying 1  Worry too much - different things 0  Trouble relaxing 0  Restless 0  Easily annoyed or irritable 0  Afraid - awful might happen 0  Total GAD 7 Score 1      Past Medical History:  Diagnosis Date  . Abnormal mammogram of right breast 01/20/2018  . Benign cyst of right breast 01/26/2018  . Centrilobular emphysema ([place]) 07/25/2018  . Chronic kidney disease, stage 3a ([place]) 12/31/2017  . Chronic low back pain   . Colon polyps   . Depression   . Fatty liver   . Hypertension   . Melanoma in situ of right upper extremity ([place])   . Osteoarthritis of lumbar spine   .  Renal cancer ([place])   . Skin cancer    Past Surgical History:  Procedure Laterality Date  . ABDOMINAL HYSTERECTOMY    . BILE DUCT STENT PLACEMENT    . CHOLECYSTECTOMY    . LUMBAR FUSION    . MOHS SURGERY     x 11  . PARTIAL NEPHRECTOMY     Social History   Tobacco Use  . Smoking status: Former Smoker    Packs/day: 1.00    Years: 40.00    Pack years: 40.00    Types: Cigarettes    Last attempt to quit: 12/21/2010    Years since quitting: 7.8  . Smokeless tobacco: Never Used  Substance Use Topics  . Alcohol use: Yes    Alcohol/week: 1.0 - 2.0 standard drinks    Types: 1 - 2 Standard drinks or equivalent per week   family history includes Alcohol abuse in her sister, sister, son, and son; Depression in her mother, sister, and sister; Heart attack in her father; Hyperlipidemia  in her father and mother; Hypertension in her father and mother.    ROS: negative except as noted in the HPI  Medications: Current Outpatient Medications  Medication Sig Dispense Refill  . allopurinol (ZYLOPRIM) 100 MG tablet Take 1 tablet (100 mg total) by mouth daily. 90 tablet 1  . atorvastatin (LIPITOR) 40 MG tablet Take 1 tablet (40 mg total) by mouth daily. 90 tablet 3  . blood glucose meter kit and supplies KIT Check morning fasting blood glucose and up to four times daily as directed. 1 each 0  . buPROPion (WELLBUTRIN SR) 200 MG 12 hr tablet Take 1 tablet (200 mg total) by mouth daily. 90 tablet 1  . celecoxib (CELEBREX) 200 MG capsule Take 1 capsule (200 mg total) by mouth 2 (two) times daily with a meal. 60 capsule 3  . Cholecalciferol (VITAMIN D3) 2000 units TABS Take 1 tablet by mouth daily.    . enalapril (VASOTEC) 10 MG tablet Take 1 tablet (10 mg total) by mouth daily. 90 tablet 1  . HYDROcodone-acetaminophen (NORCO/VICODIN) 5-325 MG tablet Take 1 tablet by mouth every 6 (six) hours as needed for moderate pain or severe pain. 60 tablet 0  . Multiple Vitamin tablet Take 1 tablet by mouth.     . Omega-3 Fatty Acids (FISH OIL) 1000 MG CAPS Take by mouth.    . ONE TOUCH ULTRA TEST test strip     . ONETOUCH DELICA LANCETS 28B MISC     . Semaglutide (OZEMPIC) 1 MG/DOSE SOPN Inject 1 mg into the skin once a week. 3 mL 3  . senna-docusate (SENOKOT-S) 8.6-50 MG tablet Take 2 tablets by mouth 2 (two) times daily. When taking hydrocodone 60 tablet 5  . terbinafine (LAMISIL) 250 MG tablet Take 1 tablet (250 mg total) by mouth daily. 42 tablet 0  . traZODone (DESYREL) 50 MG tablet Take 1 tablet (50 mg total) by mouth at bedtime as needed. 90 tablet 1  . triamterene-hydrochlorothiazide (MAXZIDE-25) 37.5-25 MG tablet Take 1 tablet by mouth daily. 90 tablet 1  . venlafaxine XR (EFFEXOR-XR) 75 MG 24 hr capsule Take 1 capsule (75 mg total) by mouth daily. 90 capsule 1   No current facility-administered medications for this visit.    Allergies  Allergen Reactions  . Latex Itching  . Metformin And Related Other (See Comments)    Malaise, fatigue  . Tape Other (See Comments) and Swelling    Burning sensation  . Tramadol Other (See Comments)    Heart fluttering  . Morphine And Related Rash       Objective:  BP 107/71   Pulse 97   Wt 287 lb (130.2 kg)   BMI 41.18 kg/m  Gen:  alert, not ill-appearing, no distress, appropriate for age, obese female HEENT: head normocephalic without obvious abnormality, conjunctiva and cornea clear, trachea midline Pulm: Normal work of breathing, normal phonation, clear to auscultation bilaterally, no wheezes, rales or rhonchi CV: Normal rate, regular rhythm, s1 and s2 distinct, no murmurs, clicks or rubs  Neuro: alert and oriented x 3, no tremor MSK: extremities atraumatic, antalgic gait and station Skin: intact, no rashes on exposed skin, no jaundice, no cyanosis Psych: well-groomed, cooperative, good eye contact, euthymic mood, affect mood-congruent, speech is articulate, and thought processes clear and goal-directed  Lab Results  Component Value  Date   CREATININE 1.10 (H) 03/31/2018   BUN 17 03/31/2018   NA 143 03/31/2018   K 4.6 03/31/2018   CL 106 03/31/2018   CO2  29 03/31/2018     No results found for this or any previous visit (from the past 72 hour(s)). No results found.    Assessment and Plan: 66 y.o. female with   .Diagnoses and all orders for this visit:  Encounter for long-term (current) use of medications  Essential hypertension -     triamterene-hydrochlorothiazide (MAXZIDE-25) 37.5-25 MG tablet; Take 1 tablet by mouth daily.  Idiopathic chronic gout of right foot without tophus -     allopurinol (ZYLOPRIM) 100 MG tablet; Take 1 tablet (100 mg total) by mouth daily.  Type 2 diabetes mellitus with hyperglycemia, without long-term current use of insulin (HCC) -     atorvastatin (LIPITOR) 40 MG tablet; Take 1 tablet (40 mg total) by mouth daily.  Dyslipidemia associated with type 2 diabetes mellitus (HCC) -     atorvastatin (LIPITOR) 40 MG tablet; Take 1 tablet (40 mg total) by mouth daily.  Recurrent major depressive disorder, in full remission ([place]) -     buPROPion (WELLBUTRIN SR) 200 MG 12 hr tablet; Take 1 tablet (200 mg total) by mouth daily.  Primary osteoarthritis of both knees -     celecoxib (CELEBREX) 200 MG capsule; Take 1 capsule (200 mg total) by mouth 2 (two) times daily with a meal. -     Ambulatory referral to Physical Therapy  Spondylosis of lumbar region without myelopathy or radiculopathy -     celecoxib (CELEBREX) 200 MG capsule; Take 1 capsule (200 mg total) by mouth 2 (two) times daily with a meal. -     Ambulatory referral to Physical Therapy  Left hip pain -     celecoxib (CELEBREX) 200 MG capsule; Take 1 capsule (200 mg total) by mouth 2 (two) times daily with a meal. -     Ambulatory referral to Physical Therapy  Postmenopausal disorder -     Ambulatory referral to Obstetrics / Gynecology  Need for shingles vaccine -     Varicella-zoster vaccine IM   Hip pain - likely  OA or referred pain from spondylosis. She had a normal bone density including the hip 06/2017. Increasing Celebrex to 200 mg bid. Referring to Aquatic PT. Keep f/u with Sports Medicine  Recheck renal function in 3 months   Patient education and anticipatory guidance given Patient agrees with treatment plan Follow-up in 3 months for medication management or sooner as needed if symptoms worsen or fail to improve  Darlyne Russian PA-C

## 2018-10-07 ENCOUNTER — Other Ambulatory Visit: Payer: Self-pay | Admitting: Sports Medicine

## 2018-10-07 DIAGNOSIS — M47816 Spondylosis without myelopathy or radiculopathy, lumbar region: Secondary | ICD-10-CM

## 2018-10-13 ENCOUNTER — Encounter: Payer: Self-pay | Admitting: Physician Assistant

## 2018-10-13 DIAGNOSIS — M25552 Pain in left hip: Secondary | ICD-10-CM | POA: Insufficient documentation

## 2018-10-15 ENCOUNTER — Encounter: Payer: Self-pay | Admitting: Physician Assistant

## 2018-10-24 ENCOUNTER — Other Ambulatory Visit: Payer: Self-pay | Admitting: Sports Medicine

## 2018-10-24 ENCOUNTER — Ambulatory Visit
Admission: RE | Admit: 2018-10-24 | Discharge: 2018-10-24 | Disposition: A | Discharge status: Home / Self Care | Payer: Managed Care, Other (non HMO) | Source: Ambulatory Visit | Attending: Sports Medicine | Admitting: Sports Medicine

## 2018-10-24 DIAGNOSIS — M47816 Spondylosis without myelopathy or radiculopathy, lumbar region: Secondary | ICD-10-CM

## 2018-10-24 MED ORDER — DIAZEPAM 5 MG PO TABS
5.0000 mg | ORAL_TABLET | Freq: Once | ORAL | Status: AC
  Administered 2018-10-24: 5 mg via ORAL

## 2018-10-24 NOTE — Discharge Instructions (Signed)

## 2018-10-26 ENCOUNTER — Telehealth: Payer: Self-pay

## 2018-10-26 DIAGNOSIS — M47816 Spondylosis without myelopathy or radiculopathy, lumbar region: Secondary | ICD-10-CM

## 2018-10-26 NOTE — Telephone Encounter (Signed)
[facility] called and would like Dr Dianah Field to place new orders for DG Facet Jt Neuro Destruct Sing L/S w/Img Guide. The order has expired. Please advise.

## 2018-10-27 ENCOUNTER — Other Ambulatory Visit: Payer: Self-pay | Admitting: Sports Medicine

## 2018-10-27 DIAGNOSIS — M47816 Spondylosis without myelopathy or radiculopathy, lumbar region: Secondary | ICD-10-CM

## 2018-10-27 NOTE — Telephone Encounter (Signed)
Done

## 2018-11-01 NOTE — Discharge Instructions (Signed)
Radio Frequency Ablation Post Procedure Discharge Instructions ° °1. May resume a regular diet and any medications that you routinely take (including pain medications). °2. No driving day of procedure. °3. Upon discharge go home and rest for at least 4 hours.  May use an ice pack as needed to injection sites on back. °4. Remove bandades later, today. ° ° ° °Please contact our office at [phone] for the following symptoms: ° °· Fever greater than 100 degrees °· Increased swelling, pain, or redness at injection site. ° ° °Thank you for visiting [facility]. °

## 2018-11-02 ENCOUNTER — Encounter: Payer: Self-pay | Admitting: Physician Assistant

## 2018-11-02 ENCOUNTER — Ambulatory Visit
Admission: RE | Admit: 2018-11-02 | Discharge: 2018-11-02 | Disposition: A | Discharge status: Home / Self Care | Payer: Managed Care, Other (non HMO) | Source: Ambulatory Visit | Attending: Sports Medicine | Admitting: Sports Medicine

## 2018-11-02 ENCOUNTER — Other Ambulatory Visit: Payer: Self-pay | Admitting: Sports Medicine

## 2018-11-02 DIAGNOSIS — M47816 Spondylosis without myelopathy or radiculopathy, lumbar region: Secondary | ICD-10-CM

## 2018-11-02 DIAGNOSIS — Z79891 Long term (current) use of opiate analgesic: Secondary | ICD-10-CM

## 2018-11-02 DIAGNOSIS — E1165 Type 2 diabetes mellitus with hyperglycemia: Secondary | ICD-10-CM

## 2018-11-02 DIAGNOSIS — M150 Primary generalized (osteo)arthritis: Secondary | ICD-10-CM

## 2018-11-02 DIAGNOSIS — M159 Polyosteoarthritis, unspecified: Secondary | ICD-10-CM

## 2018-11-02 MED ORDER — SODIUM CHLORIDE 0.9 % IV SOLN
INTRAVENOUS | Status: DC
  Administered 2018-11-02: 08:00:00 via INTRAVENOUS

## 2018-11-02 MED ORDER — MIDAZOLAM HCL 2 MG/2ML IJ SOLN
1.0000 mg | INTRAMUSCULAR | Status: DC | PRN
  Administered 2018-11-02 (×2): 1 mg via INTRAVENOUS

## 2018-11-02 MED ORDER — KETOROLAC TROMETHAMINE 30 MG/ML IJ SOLN
30.0000 mg | Freq: Once | INTRAMUSCULAR | Status: AC
  Administered 2018-11-02: 30 mg via INTRAVENOUS

## 2018-11-02 MED ORDER — METHYLPREDNISOLONE ACETATE 40 MG/ML INJ SUSP (RADIOLOG
120.0000 mg | Freq: Once | INTRAMUSCULAR | Status: AC
  Administered 2018-11-02: 120 mg via INTRALESIONAL

## 2018-11-02 MED ORDER — FENTANYL CITRATE (PF) 100 MCG/2ML IJ SOLN
25.0000 ug | INTRAMUSCULAR | Status: DC | PRN
  Administered 2018-11-02 (×2): 50 ug via INTRAVENOUS

## 2018-11-02 NOTE — Progress Notes (Signed)
54 minutes of sedation time for RFA. Gypsy Lore, RN

## 2018-11-03 MED ORDER — HYDROCODONE-ACETAMINOPHEN 5-325 MG PO TABS: 1.0000 | ORAL_TABLET | Freq: Four times a day (QID) | ORAL | 0 refills | Status: DC | PRN

## 2018-11-07 ENCOUNTER — Other Ambulatory Visit: Payer: Self-pay | Admitting: Physician Assistant

## 2018-11-07 DIAGNOSIS — I10 Essential (primary) hypertension: Secondary | ICD-10-CM

## 2018-11-07 MED ORDER — SEMAGLUTIDE (1 MG/DOSE) 2 MG/1.5ML ~~LOC~~ SOPN: 1.0000 mg | PEN_INJECTOR | SUBCUTANEOUS | 3 refills | Status: DC

## 2018-11-07 NOTE — Addendum Note (Signed)
Addended by: Nelson Chimes E on: 11/07/2018 08:13 AM   Modules accepted: Orders

## 2018-12-01 ENCOUNTER — Ambulatory Visit: Payer: Managed Care, Other (non HMO) | Admitting: Obstetrics & Gynecology

## 2018-12-01 ENCOUNTER — Encounter: Payer: Self-pay | Admitting: Obstetrics & Gynecology

## 2018-12-01 VITALS — BP 149/83 | HR 96 | Ht 70.0 in | Wt 284.0 lb

## 2018-12-01 DIAGNOSIS — Z01419 Encounter for gynecological examination (general) (routine) without abnormal findings: Secondary | ICD-10-CM

## 2018-12-01 DIAGNOSIS — F329 Major depressive disorder, single episode, unspecified: Secondary | ICD-10-CM | POA: Diagnosis not present

## 2018-12-01 DIAGNOSIS — F32A Depression, unspecified: Secondary | ICD-10-CM

## 2018-12-01 MED ORDER — FLUOXETINE HCL 20 MG PO TABS: 20.0000 mg | ORAL_TABLET | Freq: Every day | ORAL | 6 refills | Status: DC

## 2018-12-01 NOTE — Progress Notes (Signed)
Pt c/o hot flashes, mood swings and not being able to sleep

## 2018-12-01 NOTE — Progress Notes (Signed)
Subjective:    Bonnie [name] is a 67 y.o.married P2 (48 and 50 yo sons + adopted niece 47 yo, 2 grands)  female who presents for an annual exam. Her issue today is that of a return of her hot flashes. She had surgical menopause in her early 94s and took ERT until 2008. She also took prozac. She stopped the prozac in 2008 and switched to wellbutrin and effexor for her depression becaues the prozac stopped working.  The patient is sexually active. GYN screening history: last pap: was normal. The patient wears seatbelts: yes. The patient participates in regular exercise: no. Has the patient ever been transfused or tattooed?: no. The patient reports that there is not domestic violence in her life.   Menstrual History: OB History    Gravida  2   Para  2   Term      Preterm      AB      Living  2     SAB      TAB      Ectopic      Multiple      Live Births  2           Menarche age: 37 No LMP recorded. Patient has had a hysterectomy.    The following portions of the patient's history were reviewed and updated as appropriate: allergies, current medications, past family history, past medical history, past social history, past surgical history and problem list.  Review of Systems Pertinent items are noted in HPI.   Moved from [place], [place] Married for 7 years, husband had prostate cancer, so he has ED Homemaker FH- no breast/gyn/colon cancer Colonoscopy due next year Mammogram UTD    Objective:    BP (!) 149/83   Pulse 96   Ht 5\' 10"  (1.778 m)   Wt 284 lb (128.8 kg)   BMI 40.75 kg/m   General Appearance:    Alert, cooperative, no distress, appears stated age  Head:    Normocephalic, without obvious abnormality, atraumatic  Eyes:    PERRL, conjunctiva/corneas clear, EOM's intact, fundi    benign, both eyes  Ears:    Normal TM's and external ear canals, both ears  Nose:   Nares normal, septum midline, mucosa normal, no drainage    or sinus tenderness  Throat:   Lips,  mucosa, and tongue normal; teeth and gums normal  Neck:   Supple, symmetrical, trachea midline, no adenopathy;    thyroid:  no enlargement/tenderness/nodules; no carotid   bruit or JVD  Back:     Symmetric, no curvature, ROM normal, no CVA tenderness  Lungs:     Clear to auscultation bilaterally, respirations unlabored  Chest Wall:    No tenderness or deformity   Heart:    Regular rate and rhythm, S1 and S2 normal, no murmur, rub   or gallop  Breast Exam:    No tenderness, masses, or nipple abnormality  Abdomen:     Soft, non-tender, bowel sounds active all four quadrants,    no masses, no organomegaly  Genitalia:    Normal female without lesion, discharge or tenderness, mild vulvovaginal atrophy, no masses with bimanual exam, speculum exam normal     Extremities:   Extremities normal, atraumatic, no cyanosis or edema  Pulses:   2+ and symmetric all extremities  Skin:   Skin color, texture, turgor normal, no rashes or lesions  Lymph nodes:   Cervical, supraclavicular, and axillary nodes normal  Neurologic:   CNII-XII  intact, normal strength, sensation and reflexes    throughout  .    Assessment:    Healthy female exam.   Hot flashes  Plan:     trial of prozac 20 mg qd   Come back in 4 weeks

## 2018-12-01 NOTE — Addendum Note (Signed)
Addended by: Lyndal Rainbow on: 12/01/2018 02:37 PM   Modules accepted: Orders

## 2018-12-12 ENCOUNTER — Other Ambulatory Visit: Payer: Self-pay

## 2018-12-12 DIAGNOSIS — M159 Polyosteoarthritis, unspecified: Secondary | ICD-10-CM

## 2018-12-12 DIAGNOSIS — Z79891 Long term (current) use of opiate analgesic: Secondary | ICD-10-CM

## 2018-12-12 DIAGNOSIS — M150 Primary generalized (osteo)arthritis: Secondary | ICD-10-CM

## 2018-12-12 MED ORDER — HYDROCODONE-ACETAMINOPHEN 5-325 MG PO TABS: 1.0000 | ORAL_TABLET | Freq: Four times a day (QID) | ORAL | 0 refills | Status: DC | PRN

## 2018-12-19 ENCOUNTER — Ambulatory Visit: Payer: Managed Care, Other (non HMO) | Admitting: Sports Medicine

## 2018-12-19 ENCOUNTER — Encounter: Payer: Self-pay | Admitting: Sports Medicine

## 2018-12-19 ENCOUNTER — Ambulatory Visit (INDEPENDENT_AMBULATORY_CARE_PROVIDER_SITE_OTHER): Payer: Managed Care, Other (non HMO)

## 2018-12-19 DIAGNOSIS — M1612 Unilateral primary osteoarthritis, left hip: Secondary | ICD-10-CM

## 2018-12-19 NOTE — Assessment & Plan Note (Signed)
Pain, hip joint injection as above. Baseline x-rays. Return in a month.

## 2018-12-19 NOTE — Progress Notes (Signed)
Subjective:    CC: Left hip pain  HPI: This is a pleasant 67 year old female, she is post facet joint radiofrequency ablation and doing well, unfortunately she has had increasing pain in her left hip, groin, laterally and posteriorly, worse with weightbearing, severe, persistent, localized without radiation.  I reviewed the past medical history, family history, social history, surgical history, and allergies today and no changes were needed.  Please see the problem list section below in epic for further details.  Past Medical History: Past Medical History:  Diagnosis Date  . Abnormal mammogram of right breast 01/20/2018  . Benign cyst of right breast 01/26/2018  . Centrilobular emphysema ([place]) 07/25/2018  . Chronic kidney disease, stage 3a ([place]) 12/31/2017  . Chronic low back pain   . Colon polyps   . Depression   . Fatty liver   . Hypertension   . Melanoma in situ of right upper extremity ([place])   . Osteoarthritis of lumbar spine   . Renal cancer ([place])   . Skin cancer    Past Surgical History: Past Surgical History:  Procedure Laterality Date  . ABDOMINAL HYSTERECTOMY    . BILE DUCT STENT PLACEMENT    . CHOLECYSTECTOMY    . LUMBAR FUSION    . MOHS SURGERY     x 11  . PARTIAL NEPHRECTOMY     Social History: Social History   Socioeconomic History  . Marital status: Married    Spouse name: Not on file  . Number of children: Not on file  . Years of education: Not on file  . Highest education level: Not on file  Occupational History  . Not on file  Social Needs  . Financial resource strain: Not on file  . Food insecurity:    Worry: Not on file    Inability: Not on file  . Transportation needs:    Medical: Not on file    Non-medical: Not on file  Tobacco Use  . Smoking status: Former Smoker    Packs/day: 1.00    Years: 40.00    Pack years: 40.00    Types: Cigarettes    Last attempt to quit: 12/21/2010    Years since quitting: 8.0  . Smokeless tobacco: Never Used    Substance and Sexual Activity  . Alcohol use: Yes    Alcohol/week: 1.0 - 2.0 standard drinks    Types: 1 - 2 Standard drinks or equivalent per week  . Drug use: No  . Sexual activity: Yes    Birth control/protection: Surgical, None  Lifestyle  . Physical activity:    Days per week: Not on file    Minutes per session: Not on file  . Stress: Not on file  Relationships  . Social connections:    Talks on phone: Not on file    Gets together: Not on file    Attends religious service: Not on file    Active member of club or organization: Not on file    Attends meetings of clubs or organizations: Not on file    Relationship status: Not on file  Other Topics Concern  . Not on file  Social History Narrative  . Not on file   Family History: Family History  Problem Relation Age of Onset  . Depression Mother   . Hyperlipidemia Mother   . Hypertension Mother   . Heart attack Father   . Hyperlipidemia Father   . Hypertension Father   . Alcohol abuse Sister   . Depression Sister   .  Alcohol abuse Son   . Alcohol abuse Son   . Alcohol abuse Sister   . Depression Sister    Allergies: Allergies  Allergen Reactions  . Latex Itching  . Metformin And Related Other (See Comments)    Malaise, fatigue  . Morphine And Related Rash  . Tape Swelling and Other (See Comments)    Burning sensation  . Tramadol Palpitations    Heart fluttering   Medications: See med rec.  Review of Systems: No fevers, chills, night sweats, weight loss, chest pain, or shortness of breath.   Objective:    General: Well Developed, well nourished, and in no acute distress.  Neuro: Alert and oriented x3, extra-ocular muscles intact, sensation grossly intact.  HEENT: Normocephalic, atraumatic, pupils equal round reactive to light, neck supple, no masses, no lymphadenopathy, thyroid nonpalpable.  Skin: Warm and dry, no rashes. Cardiac: Regular rate and rhythm, no murmurs rubs or gallops, no lower extremity  edema.  Respiratory: Clear to auscultation bilaterally. Not using accessory muscles, speaking in full sentences. Left hip: Painful with internal rotation.  No tenderness over the greater trochanter.  Procedure: Real-time Ultrasound Guided Injection of left hip joint Device: GE Logiq E  Verbal informed consent obtained.  Time-out conducted.  Noted no overlying erythema, induration, or other signs of local infection.  Skin prepped in a sterile fashion.  Local anesthesia: Topical Ethyl chloride.  With sterile technique and under real time ultrasound guidance: 22-gauge spinal needle advanced to the femoral head/neck junction, contacted bone and injected 1 cc Kenalog 40, 2 cc lidocaine, 2 cc bupivacaine. Completed without difficulty  Pain immediately resolved suggesting accurate placement of the medication.  Advised to call if fevers/chills, erythema, induration, drainage, or persistent bleeding.  Images permanently stored and available for review in the ultrasound unit.  Impression: Technically successful ultrasound guided injection.  Impression and Recommendations:    Primary osteoarthritis of left hip Pain, hip joint injection as above. Baseline x-rays. Return in a month. ___________________________________________ Gwen Her. Dianah Field, M.D., ABFM., CAQSM. Primary Care and Sports Medicine [facility]  Adjunct Professor of [employer]

## 2019-01-02 ENCOUNTER — Encounter: Payer: Self-pay | Admitting: Obstetrics & Gynecology

## 2019-01-02 ENCOUNTER — Ambulatory Visit: Payer: BLUE CROSS/BLUE SHIELD | Admitting: Obstetrics & Gynecology

## 2019-01-02 VITALS — BP 139/86 | HR 114 | Ht 70.0 in | Wt 286.0 lb

## 2019-01-02 DIAGNOSIS — R232 Flushing: Secondary | ICD-10-CM

## 2019-01-02 NOTE — Progress Notes (Signed)
Pt did not take Prozac. Feeling better

## 2019-01-02 NOTE — Progress Notes (Signed)
   Subjective:    Patient ID: Bonnie [name], female    DOB: [birth year], 68 y.o.   MRN: [id number]  HPI  67 yo married P2 here for followup. I saw her last month with the issue of hot flashes. I prescribed prozac. She was unable to get this because of her insurance company. However, the hot flashes have resolved and she feels a ton better now. She has an appt with Roselyn Reef at [facility] this week.  Review of Systems She is dealing with children and relatives with addiction.    Objective:   Physical Exam Breathing, conversing, and ambulating normally Well nourished, well hydrated White female, no apparent distress     Assessment & Plan:  Hot flashes- resolved Continue healthy lifestyle

## 2019-01-04 ENCOUNTER — Institutional Professional Consult (permissible substitution): Payer: Managed Care, Other (non HMO)

## 2019-01-06 ENCOUNTER — Ambulatory Visit: Payer: Managed Care, Other (non HMO) | Admitting: Physician Assistant

## 2019-01-13 DIAGNOSIS — G4733 Obstructive sleep apnea (adult) (pediatric): Secondary | ICD-10-CM | POA: Diagnosis not present

## 2019-01-16 ENCOUNTER — Ambulatory Visit: Payer: Managed Care, Other (non HMO) | Admitting: Sports Medicine

## 2019-01-17 ENCOUNTER — Encounter: Payer: Self-pay | Admitting: Sports Medicine

## 2019-01-17 ENCOUNTER — Ambulatory Visit: Payer: BLUE CROSS/BLUE SHIELD | Admitting: Physician Assistant

## 2019-01-17 ENCOUNTER — Ambulatory Visit: Payer: BLUE CROSS/BLUE SHIELD | Admitting: Sports Medicine

## 2019-01-17 ENCOUNTER — Encounter: Payer: Self-pay | Admitting: Physician Assistant

## 2019-01-17 ENCOUNTER — Other Ambulatory Visit: Payer: Self-pay

## 2019-01-17 VITALS — BP 125/80 | HR 83 | Wt 284.0 lb

## 2019-01-17 DIAGNOSIS — Z0289 Encounter for other administrative examinations: Secondary | ICD-10-CM

## 2019-01-17 DIAGNOSIS — M150 Primary generalized (osteo)arthritis: Secondary | ICD-10-CM | POA: Diagnosis not present

## 2019-01-17 DIAGNOSIS — Z86010 Personal history of colonic polyps: Secondary | ICD-10-CM

## 2019-01-17 DIAGNOSIS — M1612 Unilateral primary osteoarthritis, left hip: Secondary | ICD-10-CM

## 2019-01-17 DIAGNOSIS — Z1211 Encounter for screening for malignant neoplasm of colon: Secondary | ICD-10-CM

## 2019-01-17 DIAGNOSIS — M159 Polyosteoarthritis, unspecified: Secondary | ICD-10-CM

## 2019-01-17 DIAGNOSIS — E1165 Type 2 diabetes mellitus with hyperglycemia: Secondary | ICD-10-CM | POA: Diagnosis not present

## 2019-01-17 DIAGNOSIS — E785 Hyperlipidemia, unspecified: Secondary | ICD-10-CM | POA: Diagnosis not present

## 2019-01-17 DIAGNOSIS — E1169 Type 2 diabetes mellitus with other specified complication: Secondary | ICD-10-CM | POA: Diagnosis not present

## 2019-01-17 DIAGNOSIS — N183 Chronic kidney disease, stage 3 (moderate): Secondary | ICD-10-CM | POA: Diagnosis not present

## 2019-01-17 DIAGNOSIS — Z79899 Other long term (current) drug therapy: Secondary | ICD-10-CM | POA: Insufficient documentation

## 2019-01-17 DIAGNOSIS — F3341 Major depressive disorder, recurrent, in partial remission: Secondary | ICD-10-CM

## 2019-01-17 DIAGNOSIS — N1831 Chronic kidney disease, stage 3a: Secondary | ICD-10-CM

## 2019-01-17 DIAGNOSIS — Z79891 Long term (current) use of opiate analgesic: Secondary | ICD-10-CM | POA: Diagnosis not present

## 2019-01-17 LAB — POCT GLYCOSYLATED HEMOGLOBIN (HGB A1C): HbA1c, POC (prediabetic range): 5.9 % (ref 5.7–6.4)

## 2019-01-17 LAB — POCT UA - MICROALBUMIN
Creatinine, POC: 200 mg/dL
Microalbumin Ur, POC: 10 mg/L

## 2019-01-17 MED ORDER — VENLAFAXINE HCL ER 75 MG PO CP24: 75.0000 mg | ORAL_CAPSULE | Freq: Every day | ORAL | 2 refills | Status: DC

## 2019-01-17 MED ORDER — SEMAGLUTIDE (1 MG/DOSE) 2 MG/1.5ML ~~LOC~~ SOPN: 1.0000 mg | PEN_INJECTOR | SUBCUTANEOUS | 1 refills | Status: DC

## 2019-01-17 MED ORDER — HYDROCODONE-ACETAMINOPHEN 5-325 MG PO TABS: 1.0000 | ORAL_TABLET | Freq: Four times a day (QID) | ORAL | 0 refills | Status: DC | PRN

## 2019-01-17 MED ORDER — TRAZODONE HCL 50 MG PO TABS: 25.0000 mg | ORAL_TABLET | Freq: Every evening | ORAL | 1 refills | Status: DC | PRN

## 2019-01-17 MED ORDER — FLUOXETINE HCL 20 MG PO TABS: 20.0000 mg | ORAL_TABLET | Freq: Every day | ORAL | 2 refills | Status: DC

## 2019-01-17 NOTE — Progress Notes (Signed)
Subjective:    CC: Hip pain follow-up  HPI: Bonnie [name] is a pleasant 68 year old female, I treated her a month ago for left hip pain, referrable to the joint.  We did a femoral acetabular joint injection and she returns pain-free with regards to her hip joint and groin pain.  Unfortunately she has started to have some pain that she localizes over the lateral hip.  Moderate, persistent, localized without radiation.  Oral analgesics are not effective.  I reviewed the past medical history, family history, social history, surgical history, and allergies today and no changes were needed.  Please see the problem list section below in epic for further details.  Past Medical History: Past Medical History:  Diagnosis Date  . Abnormal mammogram of right breast 01/20/2018  . Benign cyst of right breast 01/26/2018  . Centrilobular emphysema ([place]) 07/25/2018  . Chronic kidney disease, stage 3a ([place]) 12/31/2017  . Chronic low back pain   . Colon polyps   . Depression   . Fatty liver   . Hypertension   . Melanoma in situ of right upper extremity ([place])   . Osteoarthritis of lumbar spine   . Renal cancer ([place])   . Skin cancer    Past Surgical History: Past Surgical History:  Procedure Laterality Date  . ABDOMINAL HYSTERECTOMY    . BILE DUCT STENT PLACEMENT    . CHOLECYSTECTOMY    . LUMBAR FUSION    . MOHS SURGERY     x 11  . PARTIAL NEPHRECTOMY     Social History: Social History   Socioeconomic History  . Marital status: Married    Spouse name: Not on file  . Number of children: Not on file  . Years of education: Not on file  . Highest education level: Not on file  Occupational History  . Not on file  Social Needs  . Financial resource strain: Not on file  . Food insecurity:    Worry: Not on file    Inability: Not on file  . Transportation needs:    Medical: Not on file    Non-medical: Not on file  Tobacco Use  . Smoking status: Former Smoker    Packs/day: 1.00    Years: 40.00   Pack years: 40.00    Types: Cigarettes    Last attempt to quit: 12/21/2010    Years since quitting: 8.0  . Smokeless tobacco: Never Used  Substance and Sexual Activity  . Alcohol use: Yes    Alcohol/week: 1.0 - 2.0 standard drinks    Types: 1 - 2 Standard drinks or equivalent per week  . Drug use: No  . Sexual activity: Yes    Birth control/protection: Surgical, None  Lifestyle  . Physical activity:    Days per week: Not on file    Minutes per session: Not on file  . Stress: Not on file  Relationships  . Social connections:    Talks on phone: Not on file    Gets together: Not on file    Attends religious service: Not on file    Active member of club or organization: Not on file    Attends meetings of clubs or organizations: Not on file    Relationship status: Not on file  Other Topics Concern  . Not on file  Social History Narrative  . Not on file   Family History: Family History  Problem Relation Age of Onset  . Depression Mother   . Hyperlipidemia Mother   . Hypertension Mother   .  Heart attack Father   . Hyperlipidemia Father   . Hypertension Father   . Alcohol abuse Sister   . Depression Sister   . Alcohol abuse Son   . Alcohol abuse Son   . Alcohol abuse Sister   . Depression Sister    Allergies: Allergies  Allergen Reactions  . Latex Itching  . Metformin And Related Other (See Comments)    Malaise, fatigue  . Morphine And Related Rash  . Tape Swelling and Other (See Comments)    Burning sensation  . Tramadol Palpitations    Heart fluttering   Medications: See med rec.  Review of Systems: No fevers, chills, night sweats, weight loss, chest pain, or shortness of breath.   Objective:    General: Well Developed, well nourished, and in no acute distress.  Neuro: Alert and oriented x3, extra-ocular muscles intact, sensation grossly intact.  HEENT: Normocephalic, atraumatic, pupils equal round reactive to light, neck supple, no masses, no  lymphadenopathy, thyroid nonpalpable.  Skin: Warm and dry, no rashes. Cardiac: Regular rate and rhythm, no murmurs rubs or gallops, no lower extremity edema.  Respiratory: Clear to auscultation bilaterally. Not using accessory muscles, speaking in full sentences. Left hip: ROM IR: 60 Deg, ER: 60 Deg, Flexion: 120 Deg, Extension: 100 Deg, Abduction: 45 Deg, Adduction: 45 Deg Strength IR: 5/5, ER: 5/5, Flexion: 5/5, Extension: 5/5, Abduction: 5/5, Adduction: 5/5 Pelvic alignment unremarkable to inspection and palpation. Standing hip rotation and gait without trendelenburg / unsteadiness. Greater trochanter with tenderness to palpation. No tenderness over piriformis. No SI joint tenderness and normal minimal SI movement.  Procedure: Real-time Ultrasound Guided Injection of left greater trochanteric bursa Device: GE Logiq E  Verbal informed consent obtained.  Time-out conducted.  Noted no overlying erythema, induration, or other signs of local infection.  Skin prepped in a sterile fashion.  Local anesthesia: Topical Ethyl chloride.  With sterile technique and under real time ultrasound guidance: 22-gauge spinal needle advanced to the greater trochanter, injected 1 cc Kenalog 40, 2 cc lidocaine, 2 cc bupivacaine. Completed without difficulty  Pain immediately resolved suggesting accurate placement of the medication.  Advised to call if fevers/chills, erythema, induration, drainage, or persistent bleeding.  Images permanently stored and available for review in the ultrasound unit.  Impression: Technically successful ultrasound guided injection.  Impression and Recommendations:    Primary osteoarthritis of left hip Pain completely resolved from a joint perspective. She does have some trochanteric bursa pain. The left greater trochanteric bursa was injected today. Return in a month.  ___________________________________________ Gwen Her. Dianah Field, M.D., ABFM., CAQSM. Primary Care  and Sports Medicine [facility]  Adjunct Professor of [employer]

## 2019-01-17 NOTE — Assessment & Plan Note (Signed)
Pain completely resolved from a joint perspective. She does have some trochanteric bursa pain. The left greater trochanteric bursa was injected today. Return in a month.

## 2019-01-17 NOTE — Progress Notes (Signed)
HPI:                                                                Bonnie [name] is a 68 y.o. female who presents to [facility]: [place] today for diabetes follow-up and pain management  DMII: new diagnosis in April 2019, A1C 6.6. She had intolerance to Metformin. She has been taking Ozempic 1 mg weekly to help with co-morbid obesity. Compliant with medications.  Denies polydipsia, polyuria, polyphagia. Denies blurred vision or vision change. Denies extremity pain, altered sensation and paresthesias.  Denies ulcers/wounds on feet. Hx of DKA/HHS: no Diabetes associated symptoms: onychomycosis Blood glucose readings: none to report Hypoglycemia frequency: none to report Severe hypoglycemia (requiring 3rd party assistance): never   Chronic pain: OA affecting spine and multiple joints, mainly her knees. She takes Celebrex 1-2 times daily as needed and Norco for breakthrough pain. She typically requires more pain medication in the winter months when cold weather affects her joints. Prior treatments: physical therapy, rhizotomy, lumbar fusion, tylenol  HTN: taking Maxzide and Vasotec daily. Compliant with medications. She also has OSA and is on CPAP, set at 20 mmH2O. Does not check BP's at home. Denies vision change, headache, chest pain with exertion, orthopnea, lightheadedness, syncope and edema. Risk factors include: DM2, HLD, obesity, age>55  HLD: taking Atorvastatin 40 mg daily and Fish Oil. Compliant with statin therapy. Denies myalgias. Denies exertional chest pain or claudication.  Depression/Anxiety: taking Effexor 75 mg, Prozac 20 mg and Wellbutrin 150 mg without difficulty. She occasionally takes Trazodone 25-50 mg for insomnia if she is having difficulty falling asleep. Well-controlled. She states this regimen is working really well for her and she would like to continue it indefinitely since she has struggled with depressive symptoms her whole  life. Reports she was a little sad over the holidays due to not being able to see her children. Denies depressed mood or anhedonia. Denies symptoms of mania/hypomania. Denies suicidal thinking. Denies auditory/visual hallucinations.   Depression screen [facility] 2/9 01/17/2019 10/06/2018 12/30/2017  Decreased Interest 0 0 0  Down, Depressed, Hopeless 0 0 0  PHQ - 2 Score 0 0 0  Altered sleeping '1 1 1  ' Tired, decreased energy 0 0 1  Change in appetite '1 1 1  ' Feeling bad or failure about yourself  0 0 0  Trouble concentrating 0 0 0  Moving slowly or fidgety/restless 0 0 0  Suicidal thoughts 0 0 0  PHQ-9 Score '2 2 3  ' Difficult doing work/chores Not difficult at all - Not difficult at all    GAD 7 : Generalized Anxiety Score 01/17/2019 10/06/2018  Nervous, Anxious, on Edge 0 0  Control/stop worrying 0 1  Worry too much - different things 0 0  Trouble relaxing 0 0  Restless 0 0  Easily annoyed or irritable 0 0  Afraid - awful might happen 0 0  Total GAD 7 Score 0 1      Past Medical History:  Diagnosis Date  . Abnormal mammogram of right breast 01/20/2018  . Benign cyst of right breast 01/26/2018  . Centrilobular emphysema ([place]) 07/25/2018  . Chronic kidney disease, stage 3a ([place]) 12/31/2017  . Chronic low back pain   . Colon polyps   .  Depression   . Fatty liver   . Hypertension   . Melanoma in situ of right upper extremity ([place])   . Osteoarthritis of lumbar spine   . Renal cancer ([place])   . Skin cancer    Past Surgical History:  Procedure Laterality Date  . ABDOMINAL HYSTERECTOMY    . BILE DUCT STENT PLACEMENT    . CHOLECYSTECTOMY    . LUMBAR FUSION    . MOHS SURGERY     x 11  . PARTIAL NEPHRECTOMY     Social History   Tobacco Use  . Smoking status: Former Smoker    Packs/day: 1.00    Years: 40.00    Pack years: 40.00    Types: Cigarettes    Last attempt to quit: 12/21/2010    Years since quitting: 8.0  . Smokeless tobacco: Never Used  Substance Use Topics  . Alcohol  use: Yes    Alcohol/week: 1.0 - 2.0 standard drinks    Types: 1 - 2 Standard drinks or equivalent per week   family history includes Alcohol abuse in her sister, sister, son, and son; Depression in her mother, sister, and sister; Heart attack in her father; Hyperlipidemia in her father and mother; Hypertension in her father and mother.    ROS: negative except as noted in the HPI  Medications: Current Outpatient Medications  Medication Sig Dispense Refill  . aspirin 81 MG tablet Take by mouth.    Marland Kitchen atorvastatin (LIPITOR) 40 MG tablet Take 1 tablet (40 mg total) by mouth daily. 90 tablet 3  . blood glucose meter kit and supplies KIT Check morning fasting blood glucose and up to four times daily as directed. 1 each 0  . buPROPion (WELLBUTRIN SR) 200 MG 12 hr tablet Take 1 tablet (200 mg total) by mouth daily. 90 tablet 1  . celecoxib (CELEBREX) 200 MG capsule Take 1 capsule (200 mg total) by mouth 2 (two) times daily with a meal. 60 capsule 3  . Cholecalciferol (VITAMIN D3) 2000 units TABS Take 1 tablet by mouth daily.    . enalapril (VASOTEC) 10 MG tablet TAKE 1 TABLET DAILY 90 tablet 4  . FLUoxetine (PROZAC) 20 MG tablet Take 1 tablet (20 mg total) by mouth daily. 90 tablet 2  . fluticasone (FLONASE) 50 MCG/ACT nasal spray Place into the nose.    Marland Kitchen HYDROcodone-acetaminophen (NORCO/VICODIN) 5-325 MG tablet Take 1 tablet by mouth every 6 (six) hours as needed for moderate pain or severe pain. 60 tablet 0  . Multiple Vitamin tablet Take 1 tablet by mouth.    . Omega-3 Fatty Acids (FISH OIL) 1000 MG CAPS Take by mouth.    . ONE TOUCH ULTRA TEST test strip     . ONETOUCH DELICA LANCETS 60R MISC     . Semaglutide, 1 MG/DOSE, (OZEMPIC, 1 MG/DOSE,) 2 MG/1.5ML SOPN Inject 1 mg into the skin once a week. 9 mL 1  . senna-docusate (SENOKOT-S) 8.6-50 MG tablet Take 2 tablets by mouth 2 (two) times daily. When taking hydrocodone 60 tablet 5  . traZODone (DESYREL) 50 MG tablet Take 0.5-1 tablets (25-50  mg total) by mouth at bedtime as needed for sleep. 90 tablet 1  . triamterene-hydrochlorothiazide (MAXZIDE-25) 37.5-25 MG tablet Take 1 tablet by mouth daily. 90 tablet 1  . venlafaxine XR (EFFEXOR-XR) 75 MG 24 hr capsule Take 1 capsule (75 mg total) by mouth daily. 90 capsule 2   No current facility-administered medications for this visit.    Allergies  Allergen  Reactions  . Latex Itching  . Metformin And Related Other (See Comments)    Malaise, fatigue  . Morphine And Related Rash  . Tape Swelling and Other (See Comments)    Burning sensation  . Tramadol Palpitations    Heart fluttering       Objective:  BP 125/80   Pulse 83   Wt 284 lb (128.8 kg)   BMI 40.75 kg/m  Gen:  alert, not ill-appearing, no distress, appropriate for age, obese female HEENT: head normocephalic without obvious abnormality, conjunctiva and cornea clear, trachea midline Pulm: Normal work of breathing, normal phonation Neuro: alert and oriented x 3, no tremor MSK: extremities atraumatic, normal gait and station Skin: intact, no rashes on exposed skin, no jaundice, no cyanosis Psych: well-groomed, cooperative, good eye contact, euthymic mood, affect mood-congruent, speech is articulate, and thought processes clear and goal-directed  Lab Results  Component Value Date   CREATININE 1.10 (H) 03/31/2018   BUN 17 03/31/2018   NA 143 03/31/2018   K 4.6 03/31/2018   CL 106 03/31/2018   CO2 29 03/31/2018   Lab Results  Component Value Date   ALT 25 07/06/2018   AST 19 07/06/2018   BILITOT 0.4 07/06/2018   Lab Results  Component Value Date   CHOL 213 (H) 03/31/2018   HDL 55 03/31/2018   LDLCALC 121 (H) 03/31/2018   TRIG 262 (H) 03/31/2018   CHOLHDL 3.9 03/31/2018   The 10-year ASCVD risk score Mikey Bussing DC Jr., et al., 2013) is: 16.7%   Values used to calculate the score:     Age: 40 years     Sex: Female     Is Non-Hispanic African American: No     Diabetic: Yes     Tobacco smoker: No      Systolic Blood Pressure: 779 mmHg     Is BP treated: Yes     HDL Cholesterol: 55 mg/dL     Total Cholesterol: 213 mg/dL   Results for orders placed or performed in visit on 01/17/19 (from the past 72 hour(s))  POCT HgB A1C     Status: None   Collection Time: 01/17/19 11:01 AM  Result Value Ref Range   Hemoglobin A1C     HbA1c POC (<> result, manual entry)     HbA1c, POC (prediabetic range) 5.9 5.7 - 6.4 %   HbA1c, POC (controlled diabetic range)    POCT UA - Microalbumin     Status: Normal   Collection Time: 01/17/19 11:28 AM  Result Value Ref Range   Microalbumin Ur, POC 10 mg/L   Creatinine, POC 200 mg/dL   Albumin/Creatinine Ratio, Urine, POC <30    No results found.    Assessment and Plan: 68 y.o. female with   .Bonnie [name] was seen today for hyperglycemia.  Diagnoses and all orders for this visit:  Encounter for long-term (current) use of high-risk medication -     Renal Profile with Estimated GFR -     Uric acid -     CBC -     Lipid Panel w/reflex Direct LDL  Type 2 diabetes mellitus with hyperglycemia, without long-term current use of insulin (HCC) -     POCT HgB A1C -     Renal Profile with Estimated GFR -     Lipid Panel w/reflex Direct LDL -     Semaglutide, 1 MG/DOSE, (OZEMPIC, 1 MG/DOSE,) 2 MG/1.5ML SOPN; Inject 1 mg into the skin once a week. -  POCT UA - Microalbumin  Chronic use of opiate for therapeutic purpose -     HYDROcodone-acetaminophen (NORCO/VICODIN) 5-325 MG tablet; Take 1 tablet by mouth every 6 (six) hours as needed for moderate pain or severe pain. -     Pain Mgmt, Profile 6 Conf w/o mM, U  Primary osteoarthritis involving multiple joints -     HYDROcodone-acetaminophen (NORCO/VICODIN) 5-325 MG tablet; Take 1 tablet by mouth every 6 (six) hours as needed for moderate pain or severe pain.  Chronic kidney disease, stage 3a ([place]) -     Renal Profile with Estimated GFR -     Uric acid  Dyslipidemia associated with type 2 diabetes  mellitus (HCC) -     Lipid Panel w/reflex Direct LDL  Recurrent major depressive disorder, in partial remission (HCC) -     FLUoxetine (PROZAC) 20 MG tablet; Take 1 tablet (20 mg total) by mouth daily. -     traZODone (DESYREL) 50 MG tablet; Take 0.5-1 tablets (25-50 mg total) by mouth at bedtime as needed for sleep. -     venlafaxine XR (EFFEXOR-XR) 75 MG 24 hr capsule; Take 1 capsule (75 mg total) by mouth daily.  Colon cancer screening -     Ambulatory referral to Gastroenterology  Pain management contract signed -     Pain Mgmt, Profile 6 Conf w/o mM, U   Type 2 Diabetes Lab Results  Component Value Date   HGBA1C 5.9 01/17/2019  Well controlled Continue Ozempic 1 mg weekly Reminded to schedule diabetic eye exam LDL goal <70, cont statin BP goal <140/90, cont ACE Foot exam UTD Eye exam overdue Influenza and Pneumovax UTD  CKD stage 3a Normal urine microalbumin Renal function pending Avoid nephrotoxins Cont ACE  HTN BP goal <140/90 Well controlled Cont baby asa for primary prevention Waiting for labs before refilling blood pressure medications  Chronic pain mgmt Indication for chronic opioid: Osteoarthritis of multiple joints Medication and dose: Norco 5-325 mg # pills per month: #30 every 45 days Last UDS date: today, result pending Opioid Treatment Agreement signed (Y/N): Y, signed today Opioid Treatment Agreement last reviewed with patient: 01/17/2019 [facility] reviewed this encounter (include red flags):    Patient education and anticipatory guidance given Patient agrees with treatment plan Follow-up in 3 months or sooner as needed if symptoms worsen or fail to improve  Darlyne Russian PA-C

## 2019-01-17 NOTE — Patient Instructions (Addendum)
Diabetes Preventive Care: - annual foot exam  - annual dilated eye exam with an eye doctor - self foot exams at least weekly - pneumonia vaccine once (booster in 5 years and at age 68) - annual influenza vaccine - twice yearly dental cleanings and yearly exam - goal blood pressure <140/90, ideally <130/80 - LDL cholesterol <70 - A1C <7.0 - body mass index (BMI) <25.0 - follow-up every 3 months if your A1C is not at goal - follow-up every 6 months if diabetes is well controlled   Chronic Kidney Disease, Adult Chronic kidney disease (CKD) occurs when the kidneys become damaged slowly over a long period of time. The kidneys are a pair of organs that do many important jobs in the body, including:  Removing waste and extra fluid from the blood to make urine.  Making hormones that maintain the amount of fluid in tissues and blood vessels.  Maintaining the right amount of fluids and chemicals in the body. A small amount of kidney damage may not cause problems, but a large amount of damage may make it hard or impossible for the kidneys to work the way they should. If steps are not taken to slow down kidney damage or to stop it from getting worse, the kidneys may stop working permanently (end-stage renal disease or ESRD). Most of the time, CKD does not go away, but it can often be controlled. People who have CKD are usually able to live normal lives. What are the causes? The most common causes of this condition are diabetes and high blood pressure (hypertension). Other causes include:  Heart and blood vessel (cardiovascular) disease.  Kidney diseases, such as: ? Glomerulonephritis. ? Interstitial nephritis. ? Polycystic kidney disease. ? Renal vascular disease.  Diseases that affect the immune system.  Genetic diseases.  Medicines that damage the kidneys, such as anti-inflammatory medicines.  Being around or being in contact with poisonous (toxic) substances.  A kidney or urinary  infection that occurs again and again (recurs).  Vasculitis. This is swelling or inflammation of the blood vessels.  A problem with urine flow that may be caused by: ? Cancer. ? Having kidney stones more than one time. ? An enlarged prostate, in males. What increases the risk? You are more likely to develop this condition if you:  Are older than age 31.  Are female.  Are African-American, Hispanic, Asian, [place], or American [place].  Are a current or former smoker.  Are obese.  Have a family history of kidney disease or failure.  Often take medicines that are damaging to the kidneys. What are the signs or symptoms? Symptoms of this condition include:  Swelling (edema) of the face, legs, ankles, or feet.  Tiredness (lethargy) and having less energy.  Nausea or vomiting.  Confusion or trouble concentrating.  Problems with urination, such as: ? Painful or burning feeling during urination. ? Decreased urine production. ? Frequent urination, especially at night. ? Bloody urine.  Muscle twitches and cramps, especially in the legs.  Shortness of breath.  Weakness.  Loss of appetite.  Metallic taste in the mouth.  Trouble sleeping.  Dry, itchy skin.  A low blood count (anemia).  Pale lining of the eyelids and surface of the eye (conjunctiva). Symptoms develop slowly and may not be obvious until the kidney damage becomes severe. It is possible to have kidney disease for years without having any symptoms. How is this diagnosed? This condition may be diagnosed based on:  Blood tests.  Urine tests.  Imaging tests, such as an ultrasound or CT scan.  A test in which a sample of tissue is removed from the kidneys to be examined under a microscope (kidney biopsy). These test results will help your health care provider determine how serious the CKD is. How is this treated? There is no cure for most cases of this condition, but treatment usually relieves  symptoms and prevents or slows the progression of the disease. Treatment may include:  Making diet changes, which may require you to avoid alcohol, salty foods (sodium), and foods that are high in potassium, calcium, and protein.  Medicines: ? To lower blood pressure. ? To control blood glucose. ? To relieve anemia. ? To relieve swelling. ? To protect your bones. ? To improve the balance of electrolytes in your blood.  Removing toxic waste from the body through types of dialysis, if the kidneys can no longer do their job (kidney failure).  Managing any other conditions that are causing your CKD or making it worse. Follow these instructions at home: Medicines  Take over-the-counter and prescription medicines only as told by your health care provider. The dose of some medicines that you take may need to be adjusted.  Do not take any new medicines unless approved by your health care provider. Many medicines can worsen your kidney damage.  Do not take any vitamin and mineral supplements unless approved by your health care provider. Many nutritional supplements can worsen your kidney damage. General instructions  Follow your prescribed diet as told by your health care provider.  Do not use any products that contain nicotine or tobacco, such as cigarettes and e-cigarettes. If you need help quitting, ask your health care provider.  Monitor and track your blood pressure at home. Report changes in your blood pressure as told by your health care provider.  If you are being treated for diabetes, monitor and track your blood sugar (blood glucose) levels as told by your health care provider.  Maintain a healthy weight. If you need help with this, ask your health care provider.  Start or continue an exercise plan. Exercise at least 30 minutes a day, 5 days a week.  Keep your immunizations up to date as told by your health care provider.  Keep all follow-up visits as told by your health care  provider. This is important. Where to find more information  American Association of Kidney Patients: BombTimer.gl  National Kidney Foundation: www.kidney.[place]: https://mathis.com/  [facility]: www.lifeoptions.org and www.kidneyschool.org Contact a health care provider if:  Your symptoms get worse.  You develop new symptoms. Get help right away if:  You develop symptoms of ESRD, which include: ? Headaches. ? Numbness in the hands or feet. ? Easy bruising. ? Frequent hiccups. ? Chest pain. ? Shortness of breath. ? Lack of menstruation, in women.  You have a fever.  You have decreased urine production.  You have pain or bleeding when you urinate. Summary  Chronic kidney disease (CKD) occurs when the kidneys become damaged slowly over a long period of time.  The most common causes of this condition are diabetes and high blood pressure (hypertension).  There is no cure for most cases of this condition, but treatment usually relieves symptoms and prevents or slows the progression of the disease. Treatment may include a combination of medicines and lifestyle changes. This information is not intended to replace advice given to you by your health care provider. Make sure you discuss any questions you  have with your health care provider. Document Released: 09/15/2008 Document Revised: 01/14/2017 Document Reviewed: 01/14/2017 Elsevier Interactive Patient Education  2019 [employer].

## 2019-01-18 ENCOUNTER — Encounter: Payer: Self-pay | Admitting: Physician Assistant

## 2019-01-18 LAB — LIPID PANEL W/REFLEX DIRECT LDL
CHOL/HDL RATIO: 3.1 (calc) (ref <5.0)
Cholesterol: 173 mg/dL (ref <200)
HDL: 55 mg/dL (ref >50)
LDL CHOLESTEROL (CALC): 97 mg/dL
NON-HDL CHOLESTEROL (CALC): 118 mg/dL (ref <130)
TRIGLYCERIDES: 115 mg/dL (ref <150)

## 2019-01-18 LAB — RENAL PROFILE WITH ESTIMATED GFR
Albumin: 4 g/dL (ref 3.6–5.1)
BUN/Creatinine Ratio: 18 (calc) (ref 6–22)
BUN: 19 mg/dL (ref 7–25)
CO2: 27 mmol/L (ref 20–32)
CREATININE: 1.06 mg/dL — AB (ref 0.50–0.99)
Calcium: 9.4 mg/dL (ref 8.6–10.4)
Chloride: 105 mmol/L (ref 98–110)
GFR, EST AFRICAN AMERICAN: 63 mL/min/{1.73_m2} (ref >=60)
GFR, EST NON AFRICAN AMERICAN: 54 mL/min/{1.73_m2} — AB (ref >=60)
Glucose, Bld: 100 mg/dL — ABNORMAL HIGH (ref 65–99)
Phosphorus: 3.5 mg/dL (ref 2.1–4.3)
Potassium: 4.1 mmol/L (ref 3.5–5.3)
Sodium: 141 mmol/L (ref 135–146)

## 2019-01-18 LAB — CBC
HEMATOCRIT: 36.8 % (ref 35.0–45.0)
Hemoglobin: 12.2 g/dL (ref 11.7–15.5)
MCH: 29.7 pg (ref 27.0–33.0)
MCHC: 33.2 g/dL (ref 32.0–36.0)
MCV: 89.5 fL (ref 80.0–100.0)
MPV: 9.9 fL (ref 7.5–12.5)
Platelets: 358 10*3/uL (ref 140–400)
RBC: 4.11 10*6/uL (ref 3.80–5.10)
RDW: 12.9 % (ref 11.0–15.0)
WBC: 5.5 10*3/uL (ref 3.8–10.8)

## 2019-01-18 LAB — URIC ACID: Uric Acid, Serum: 7.5 mg/dL — ABNORMAL HIGH (ref 2.5–7.0)

## 2019-01-19 ENCOUNTER — Encounter: Payer: Self-pay | Admitting: Physician Assistant

## 2019-01-19 DIAGNOSIS — Z86010 Personal history of colonic polyps: Secondary | ICD-10-CM

## 2019-01-19 DIAGNOSIS — Z1211 Encounter for screening for malignant neoplasm of colon: Secondary | ICD-10-CM

## 2019-01-20 LAB — DRUGS OF ABUSE SCREEN W/O ALC, ROUTINE URINE
AMPHETAMINES (1000 ng/mL SCRN): NEGATIVE
BARBITURATES: NEGATIVE
BENZODIAZEPINES: NEGATIVE
COCAINE METABOLITES: NEGATIVE
HYDROCODONE: POSITIVE — AB
MARIJUANA MET (50 ng/mL SCRN): NEGATIVE
METHADONE: NEGATIVE
METHAQUALONE: NEGATIVE
OPIATES: POSITIVE — AB
PHENCYCLIDINE: NEGATIVE
PROPOXYPHENE: NEGATIVE

## 2019-01-23 NOTE — Telephone Encounter (Signed)
Sent referral to [place]

## 2019-02-08 ENCOUNTER — Telehealth: Payer: Self-pay | Admitting: Gastroenterology

## 2019-02-08 NOTE — Telephone Encounter (Signed)
Mrs Klassen would like to have another Colonoscopy procedure done sooner than 5 yrs due to "high risk" Has requested Dr Havery Moros.

## 2019-02-21 ENCOUNTER — Other Ambulatory Visit: Payer: Self-pay | Admitting: Physician Assistant

## 2019-02-21 DIAGNOSIS — Z1231 Encounter for screening mammogram for malignant neoplasm of breast: Secondary | ICD-10-CM

## 2019-02-22 ENCOUNTER — Ambulatory Visit
Admission: RE | Admit: 2019-02-22 | Discharge: 2019-02-22 | Disposition: A | Discharge status: Home / Self Care | Payer: BLUE CROSS/BLUE SHIELD | Source: Ambulatory Visit | Attending: Physician Assistant | Admitting: Physician Assistant

## 2019-02-22 DIAGNOSIS — Z1231 Encounter for screening mammogram for malignant neoplasm of breast: Secondary | ICD-10-CM | POA: Diagnosis not present

## 2019-02-23 ENCOUNTER — Encounter: Payer: Self-pay | Admitting: Gastroenterology

## 2019-02-24 ENCOUNTER — Other Ambulatory Visit: Payer: Self-pay

## 2019-02-24 DIAGNOSIS — Z79891 Long term (current) use of opiate analgesic: Secondary | ICD-10-CM

## 2019-02-24 DIAGNOSIS — M150 Primary generalized (osteo)arthritis: Secondary | ICD-10-CM

## 2019-02-24 DIAGNOSIS — M159 Polyosteoarthritis, unspecified: Secondary | ICD-10-CM

## 2019-02-24 NOTE — Telephone Encounter (Signed)
[name] requests a refill of the Hydrocodone.

## 2019-02-28 ENCOUNTER — Ambulatory Visit: Payer: BLUE CROSS/BLUE SHIELD | Admitting: Sports Medicine

## 2019-03-01 MED ORDER — HYDROCODONE-ACETAMINOPHEN 5-325 MG PO TABS: 1.0000 | ORAL_TABLET | Freq: Four times a day (QID) | ORAL | 0 refills | Status: DC | PRN

## 2019-03-01 NOTE — Telephone Encounter (Signed)
Appointment scheduled with Ms. Hosick for 03-30-2019.

## 2019-03-30 ENCOUNTER — Other Ambulatory Visit: Payer: Self-pay

## 2019-03-30 ENCOUNTER — Ambulatory Visit (INDEPENDENT_AMBULATORY_CARE_PROVIDER_SITE_OTHER): Payer: BLUE CROSS/BLUE SHIELD | Admitting: Gastroenterology

## 2019-03-30 ENCOUNTER — Encounter: Payer: Self-pay | Admitting: Gastroenterology

## 2019-03-30 DIAGNOSIS — Z86010 Personal history of colonic polyps: Secondary | ICD-10-CM

## 2019-03-30 DIAGNOSIS — M549 Dorsalgia, unspecified: Secondary | ICD-10-CM

## 2019-03-30 DIAGNOSIS — K834 Spasm of sphincter of Oddi: Secondary | ICD-10-CM | POA: Diagnosis not present

## 2019-03-30 NOTE — Patient Instructions (Signed)
If you are age 68 or older, your body mass index should be between 23-30. Your There is no height or weight on file to calculate BMI. If this is out of the aforementioned range listed, please consider follow up with your Primary Care Provider.  If you are age 27 or younger, your body mass index should be between 19-25. Your There is no height or weight on file to calculate BMI. If this is out of the aformentioned range listed, please consider follow up with your Primary Care Provider.   Please go to the lab in the basement of our building to have lab work done. Hit "B" for basement when you get on the elevator.  When the doors open the lab is on your left.  We will call you with the results. Thank you.  You will be due for a colonoscopy in December 2020.  We will let you know when it is time to schedule that procedure.   Thank you for entrusting me with your care and for choosing [facility], Dr. [place]vale Cellar

## 2019-03-30 NOTE — Progress Notes (Signed)
Virtual Visit via Video Note  I connected with Bonnie [name] on 03/30/19 at  9:30 AM EDT by a video enabled telemedicine application and verified that I am speaking with the correct person using two identifiers.   I discussed the limitations of evaluation and management by telemedicine and the availability of in person appointments. The patient expressed understanding and agreed to proceed.  THIS ENCOUNTER IS A VIRTUAL VISIT DUE TO COVID-19 - PATIENT WAS NOT SEEN IN THE OFFICE. PATIENT HAS CONSENTED TO VIRTUAL VISIT / TELEMEDICINE VISIT VIA WEB-EX   Location of patient: home Location of provider: office Name of referring provider: Nelson Chimes PA Persons participating: myself, patient  HPI :  68 y/o with a history of renal cell CA s/p partial nephrectomy, history of sphincter of odi dysfunction, history of colon polyps, referred by Nelson Chimes PA for colonoscopy and discussion of her GI history.  She had her last colonoscopy in December 2015. One small sessile serrated polyp removed. No trouble with the bowels. No blood in the stools. No family history of colon cancer.  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 She thinks this was done in [place] at the time. No records of this available. She presented with mid back pain which took 2 years to sort out and come to a diagnosis, per her report. She is having some pain in the mid back, feels like prior SOD symptoms. She thinks pain for 2-3 months it has bothered her. It comes and goes. She does not think eating can make it worse. No nausea or vomiting. Pain localizes to the back mostly but can radiate slightly to the RUQ. Symptoms are mild and suggestive of her prior symptoms at the mildest level but not too bothersome right now. No records. She reports the ERCP  with sphincterotomy took away her symptoms.   Colonoscopy 12/04/2014 - Dr. Jeneen Rinks Mu - good bowel prep, large AVM in mid ascending colon, diverticulum in the cecum, 2 polyps 3-47m in size in the sigmoid colon, removed with forceps. One polyp sessile serrated, the other is hyperplastic.  Past Medical History:  Diagnosis Date  . Abnormal mammogram of right breast 01/20/2018  . Benign cyst of right breast 01/26/2018  . Cent[place]ular [place]se[place] [place] 07/25/[place]  . Chronic kidney disease[place] 3a [place] 12/31/2017  . Chronic low back pain   . Colon polyps   . Depression   . Fatty liver   . Hypertension   . 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 Osteoarthritis of lumbar spine   . Renal cancer ([facility]   . Skin cancer   . Sphincter of Oddi dysfunction      Past Surgical History:  Procedure Laterality Date  . ABDOMINAL HYSTERECTOMY    . BILE DUCT STENT PLACEMENT    . CHOLECYSTECTOMY    . LUMBAR FUSION    . MOHS SURGERY     x 11  . PARTIAL NEPHRECTOMY     Family History  Problem Relation Age of Onset  . Depression Mother   . Hyperlipidemia Mother   . Hypertension Mother   . Heart attack Father   . Hyperlipidemia Father   . Hypertension Father   . Alcohol abuse Sister   . Depression Sister   . Alcohol abuse Son   . Alcohol abuse Son   .  Alcohol abuse Sister   . Depression Sister    Social History   Tobacco Use  . Smoking status: Former Smoker    Packs/day: 1.00    Years: 40.00    Pack years: 40.00    Types: Cigarettes    Last attempt to quit: 12/21/2010    Years since quitting: 8.2  . Smokeless tobacco: Never Used  Substance Use Topics  . Alcohol use: Yes    Alcohol/week: 1.0 - 2.0 standard drinks    Types: 1 - 2 Standard drinks or equivalent per week  . Drug use: No   Current Outpatient Medications  Medication Sig Dispense Refill  . aspirin 81 MG tablet Take by mouth.    Marland Kitchen atorvastatin (LIPITOR) 40 MG tablet Take 1 tablet (40 mg total) by mouth daily. 90  tablet 3  . blood glucose meter kit and supplies KIT Check morning fasting blood glucose and up to four times daily as directed. 1 each 0  . buPROPion (WELLBUTRIN SR) 200 MG 12 hr tablet Take 1 tablet (200 mg total) by mouth daily. 90 tablet 1  . celecoxib (CELEBREX) 200 MG capsule Take 1 capsule (200 mg total) by mouth 2 (two) times daily with a meal. 60 capsule 3  . Cholecalciferol (VITAMIN D3) 2000 units TABS Take 1 tablet by mouth daily.    . enalapril (VASOTEC) 10 MG tablet TAKE 1 TABLET DAILY 90 tablet 4  . FLUoxetine (PROZAC) 20 MG tablet Take 1 tablet (20 mg total) by mouth daily. 90 tablet 2  . fluticasone (FLONASE) 50 MCG/ACT nasal spray Place into the nose.    Marland Kitchen HYDROcodone-acetaminophen (NORCO/VICODIN) 5-325 MG tablet Take 1 tablet by mouth every 6 (six) hours as needed for moderate pain or severe pain. 60 tablet 0  . Multiple Vitamin tablet Take 1 tablet by mouth.    . Omega-3 Fatty Acids (FISH OIL) 1000 MG CAPS Take by mouth.    . ONE TOUCH ULTRA TEST test strip     . ONETOUCH DELICA LANCETS 06T MISC     . Semaglutide, 1 MG/DOSE, (OZEMPIC, 1 MG/DOSE,) 2 MG/1.5ML SOPN Inject 1 mg into the skin once a week. 9 mL 1  . senna-docusate (SENOKOT-S) 8.6-50 MG tablet Take 2 tablets by mouth 2 (two) times daily. When taking hydrocodone 60 tablet 5  . traZODone (DESYREL) 50 MG tablet Take 0.5-1 tablets (25-50 mg total) by mouth at bedtime as needed for sleep. 90 tablet 1  . triamterene-hydrochlorothiazide (MAXZIDE-25) 37.5-25 MG tablet Take 1 tablet by mouth daily. 90 tablet 1  . venlafaxine XR (EFFEXOR-XR) 75 MG 24 hr capsule Take 1 capsule (75 mg total) by mouth daily. 90 capsule 2   No current facility-administered medications for this visit.    Allergies  Allergen Reactions  . Latex Itching  . Metformin And Related Other (See Comments)    Malaise, fatigue  . Morphine And Related Rash  . Tape Swelling and Other (See Comments)    Burning sensation  . Tramadol Palpitations    Heart  fluttering     Review of Systems: All systems reviewed and negative except where noted in HPI.   Lab Results  Component Value Date   WBC 5.5 01/17/2019   HGB 12.2 01/17/2019   HCT 36.8 01/17/2019   MCV 89.5 01/17/2019   PLT 358 01/17/2019    Lab Results  Component Value Date   CREATININE 1.06 (H) 01/17/2019   BUN 19 01/17/2019   NA 141 01/17/2019   K 4.1  01/17/2019   CL 105 01/17/2019   CO2 27 01/17/2019    Lab Results  Component Value Date   ALT 25 07/06/2018   AST 19 07/06/2018   BILITOT 0.4 07/06/2018      Physical Exam: NA   ASSESSMENT AND PLAN:  68 y/o female here for reassessment of the following:  History of colon polyps - 1 small sessile serrated adenoma in 2015, based on this and good prep reported on that exam, would recommend repeat surveillance in 5 years from that exam, so due December of this year. Reassured her this recommendation is based on national guidelines and I think okay to wait until then as long as she continues to feel well in regards to her bowel function   Back pain / history of Sphincter of Odi dysfunction - s/p ERCP with manometry and sphincterotomy in [place] several years ago. She reported troublesome back pain at that time which took over 2 years to figure out her diagnosis. Following sphincterotomy she did quite well with complete resolution of symptoms. She has had some mild mid back pain with radiation to the RUQ at times, she thinks concerning that the SOD could be coming back but she thinks quite mild and not too bothersome. Checking LFTs and lipase would be useful when symptomatic to correlate as an initial evaluation. She wishes to delay doing this until the COVID-19 outbreak has been controlled, if symptoms remain mild. We can consider an [place] pending her course as well. Pain otherwise could also be musculoskeletal back pain, will monitor her course. I have asked her to fax [place] prior records to confirm this prior history /  diagnosis.  All questions answered, she agreed with the plan.  [place] Cellar, MD [facility]  CC: Ottis Stain*

## 2019-04-11 ENCOUNTER — Telehealth: Payer: Self-pay | Admitting: Gastroenterology

## 2019-04-11 NOTE — Telephone Encounter (Signed)
Patient provided records of her prior workup and treatment which I reviewed and summarized below:  In 1989 she had a workup for abdominal pain. Seen at [facility] eventually and had sphincter of odi manometry, with a basal biliary pancreatic pressure of 150, with PD being 15, SOD differential pressure of 152mm/Hg. Sounds like this was initially treated with ERCP and not improved. She subsequently underwent cholecystectomy, common duct exploration, with surgical sphincteroplasty on 11/23/88. This apparently completely resolved her symptoms at the time for a period of time, although some milder pain recurred. In 1990 she had an EGD which was normal and a follow up US which was normal. She apparently had some recurrence of symptoms around 2000 and had another ERCP at that time on 05/28/1999 - biliary tree appeared normal, no irregularity of the bile duct.   These records will be uploaded into our EMR. I will await results of her lab tests that are pending and consideration for US of the biliary tree.

## 2019-04-14 DIAGNOSIS — G4733 Obstructive sleep apnea (adult) (pediatric): Secondary | ICD-10-CM | POA: Diagnosis not present

## 2019-04-15 IMAGING — MR MR LUMBAR SPINE W/O CM
4 of 5 series · 27 of 48 positions shown · non-contrast
Comparison: None.

CLINICAL DATA: Back pain status post fall.

EXAM:
MRI LUMBAR SPINE WITHOUT CONTRAST
TECHNIQUE: Multiplanar, multisequence MR imaging of the lumbar spine was
performed. No intravenous contrast was administered.

[Series 2: T2 · sagittal · 4.0mm · 0.81mm/px · 6 of 15 slices shown (1 of 2)]
[im 1/15]
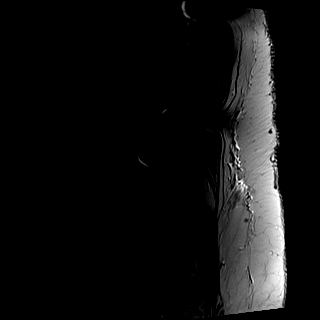
[im 3/15]
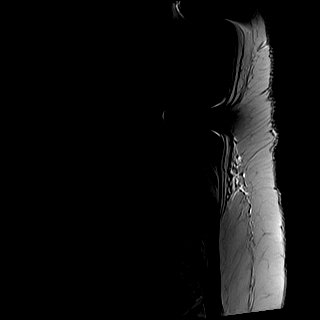
[im 6/15]
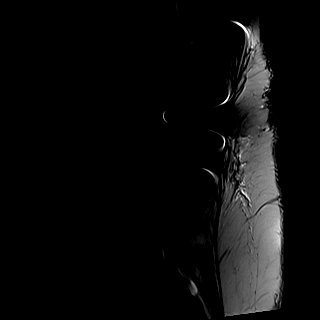
[im 9/15]
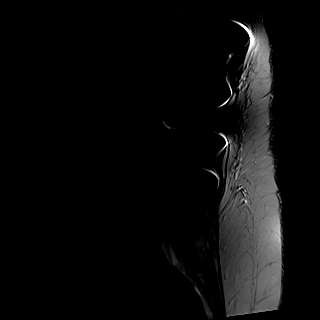
[im 12/15]
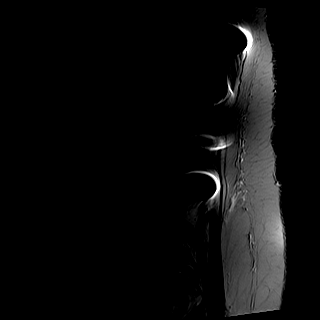
[im 15/15]
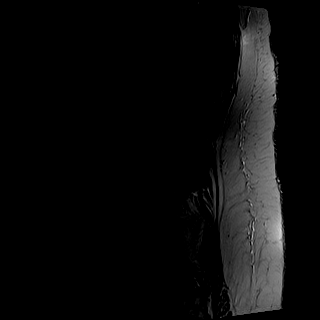

[Series 3: T1 · sagittal · 4.0mm · 0.41mm/px · 5 of 15 slices shown (1 of 2)]
[im 1/15]
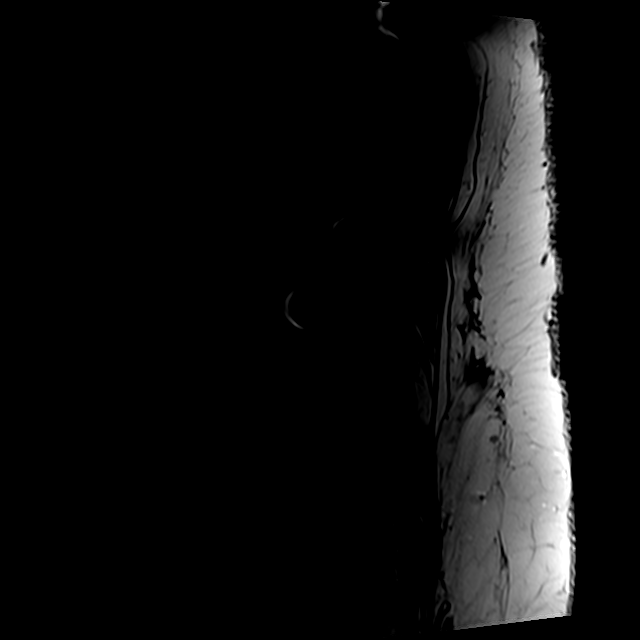
[im 4/15]
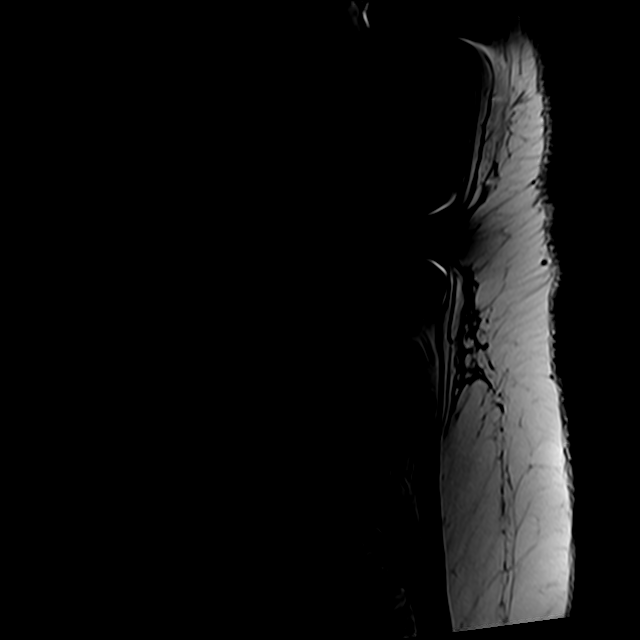
[im 8/15]
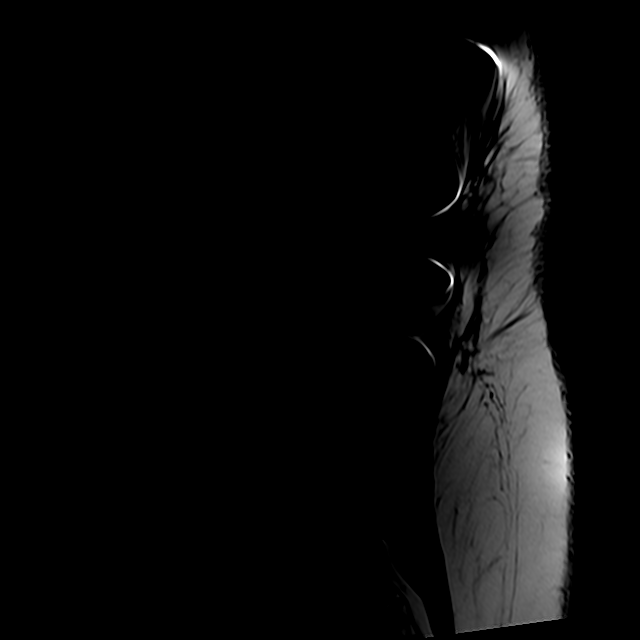
[im 11/15]
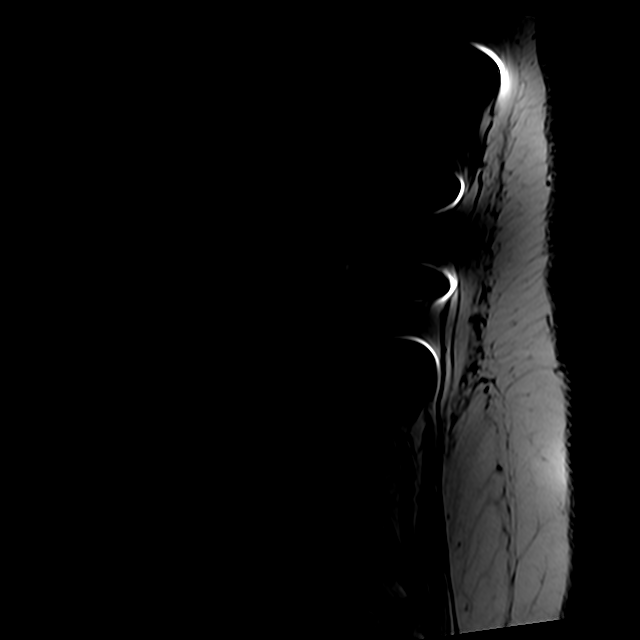
[im 15/15]
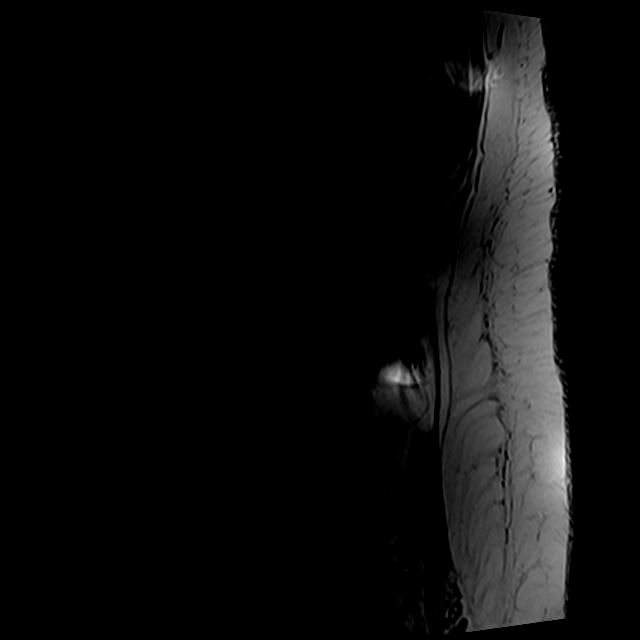

[Series 5: T2 · axial · 4.0mm · 0.78mm/px · z∈[-100,+117]mm · 10 of 43 slices shown (2 of 2)]
[im 3/43]
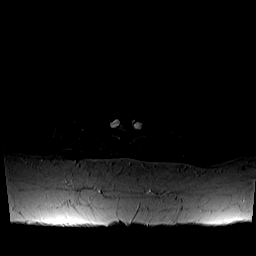
[im 6/43]
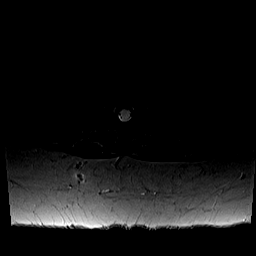
[im 9/43]
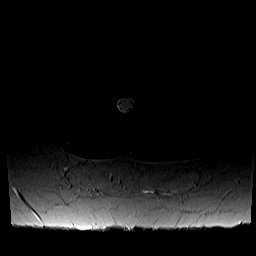
[im 15/43]
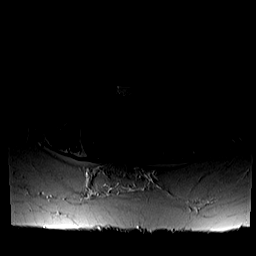
[im 20/43]
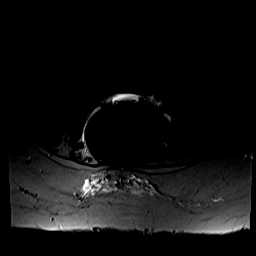
[im 23/43]
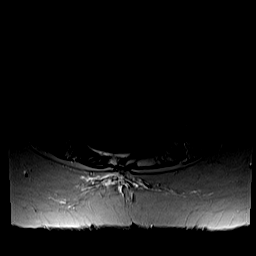
[im 26/43]
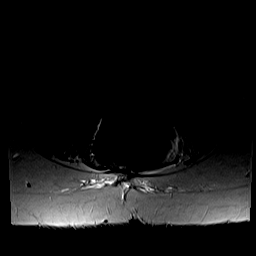
[im 31/43]
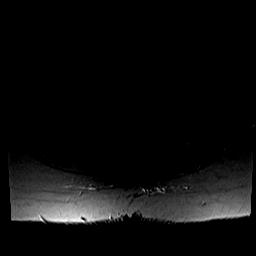
[im 37/43]
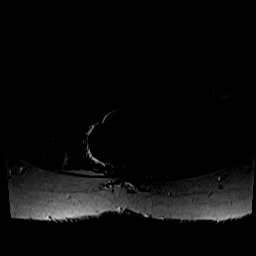
[im 43/43]
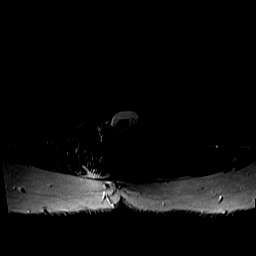

[Series 6: T1 · axial · 4.0mm · 0.39mm/px · z∈[-100,+88]mm · 6 of 43 slices shown (2 of 2)]
[im 3/43]
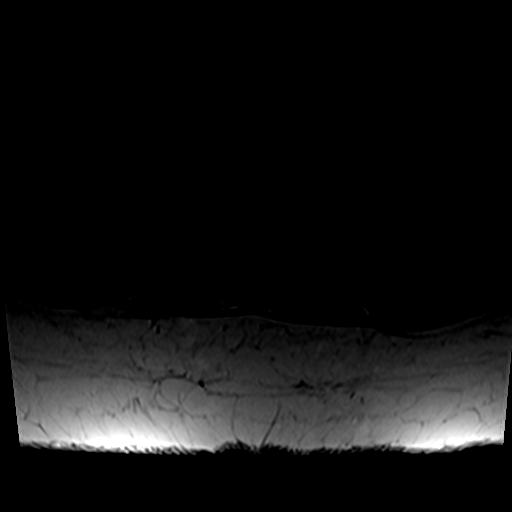
[im 6/43]
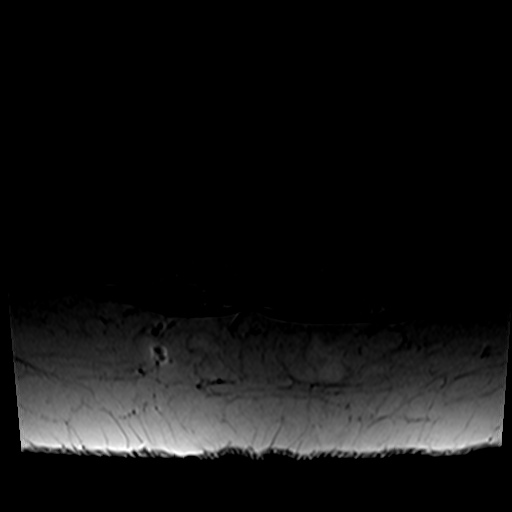
[im 9/43]
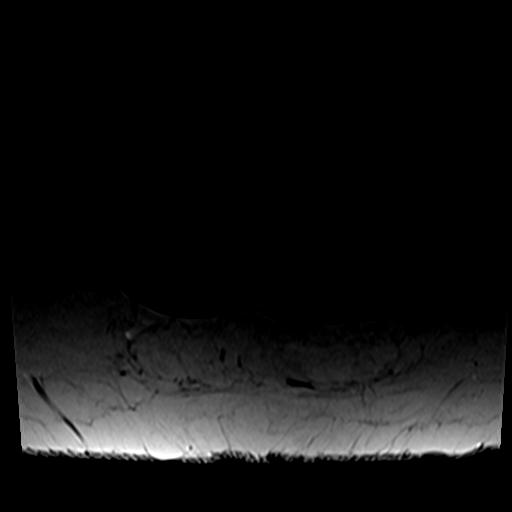
[im 15/43]
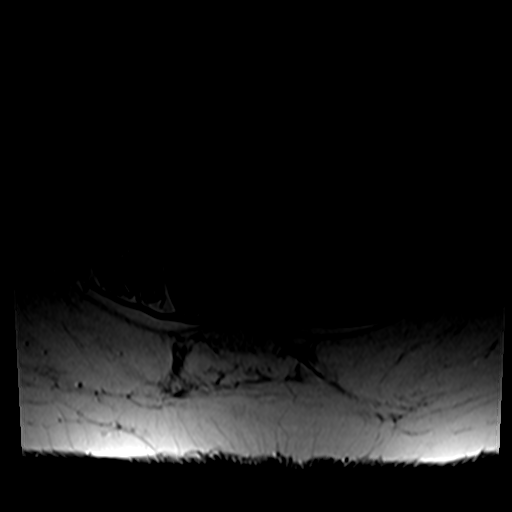
[im 23/43]
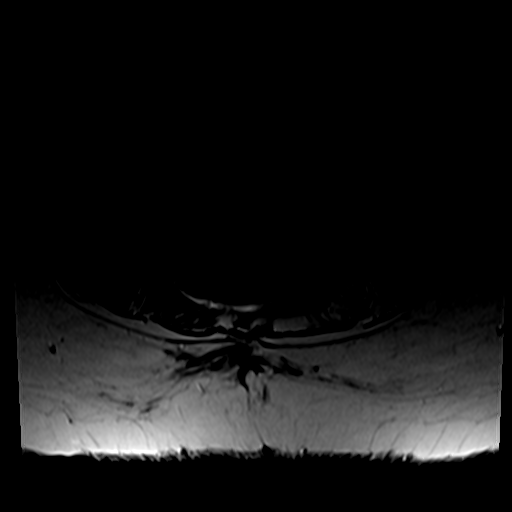
[im 37/43]
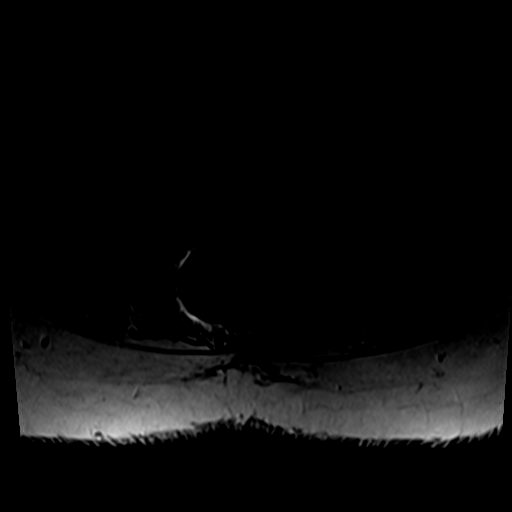

[27 of 48 positions shown; findings below may reference images not displayed]

FINDINGS: Segmentation:  Standard.

Alignment:  Physiologic.

Vertebrae: No discitis or osteomyelitis. No aggressive osseous
lesion. Chronic L1 vertebral body compression fracture with
approximately 40% height loss and 5 mm of retropulsion of the
superior posterior margin mildly impressing on the thecal sac.
Remainder the vertebral body heights are maintained.

Conus medullaris and cauda equina: Conus extends to the L1 level.
Conus and cauda equina appear normal.

Paraspinal and other soft tissues: No acute paraspinal abnormality.
Postsurgical changes in the posterior paraspinal soft tissues of the
thoracolumbar spine.

Disc levels:

Disc spaces: Degenerative disc disease with disc height loss at
L5-S1.

Susceptibility artifact from metallic orthopedic hardware in the
posterior elements of the thoracolumbar spine obscures the spinal
canal at L1 through L3.

T12-L1: No significant disc bulge. No evidence of neural foraminal
stenosis. No central canal stenosis.

L1-L2: Posterior spinal canal is partially obscured by
susceptibility artifact from orthopedic hardware. No evidence of
neural foraminal stenosis. No central canal stenosis.

L2-L3: Spinal canal is completely obscured by susceptibility
artifact. No evidence of neural foraminal stenosis.

L3-L4: Minimal broad-based disc bulge. No evidence of neural
foraminal stenosis. No central canal stenosis.

L4-L5: No significant disc bulge. Mild bilateral facet arthropathy.
No evidence of neural foraminal stenosis. No central canal stenosis.

L5-S1: Broad-based disc bulge with a small central disc protrusion.
No evidence of neural foraminal stenosis. No central canal stenosis.
IMPRESSION: 1. Mild lumbar spine spondylosis as described above.
2. Chronic L1 vertebral body compression fracture.

## 2019-04-18 ENCOUNTER — Ambulatory Visit: Payer: BLUE CROSS/BLUE SHIELD | Admitting: Physician Assistant

## 2019-05-04 ENCOUNTER — Other Ambulatory Visit: Payer: Self-pay

## 2019-05-04 DIAGNOSIS — M159 Polyosteoarthritis, unspecified: Secondary | ICD-10-CM

## 2019-05-04 DIAGNOSIS — Z79891 Long term (current) use of opiate analgesic: Secondary | ICD-10-CM

## 2019-05-04 MED ORDER — HYDROCODONE-ACETAMINOPHEN 5-325 MG PO TABS: 1.0000 | ORAL_TABLET | Freq: Four times a day (QID) | ORAL | 0 refills | Status: DC | PRN

## 2019-05-17 ENCOUNTER — Ambulatory Visit: Payer: BLUE CROSS/BLUE SHIELD | Admitting: Physician Assistant

## 2019-05-18 ENCOUNTER — Encounter: Payer: Self-pay | Admitting: Physician Assistant

## 2019-05-18 ENCOUNTER — Ambulatory Visit: Payer: BLUE CROSS/BLUE SHIELD | Admitting: Physician Assistant

## 2019-05-18 ENCOUNTER — Other Ambulatory Visit: Payer: Self-pay

## 2019-05-18 ENCOUNTER — Ambulatory Visit (INDEPENDENT_AMBULATORY_CARE_PROVIDER_SITE_OTHER): Payer: BLUE CROSS/BLUE SHIELD | Admitting: Physician Assistant

## 2019-05-18 DIAGNOSIS — F4320 Adjustment disorder, unspecified: Secondary | ICD-10-CM | POA: Insufficient documentation

## 2019-05-18 DIAGNOSIS — F4321 Adjustment disorder with depressed mood: Secondary | ICD-10-CM

## 2019-05-18 DIAGNOSIS — F411 Generalized anxiety disorder: Secondary | ICD-10-CM

## 2019-05-18 DIAGNOSIS — F331 Major depressive disorder, recurrent, moderate: Secondary | ICD-10-CM

## 2019-05-18 MED ORDER — ALPRAZOLAM 0.25 MG PO TABS: 0.2500 mg | ORAL_TABLET | Freq: Three times a day (TID) | ORAL | 0 refills | Status: DC | PRN

## 2019-05-18 MED ORDER — VENLAFAXINE HCL ER 75 MG PO CP24: 75.0000 mg | ORAL_CAPSULE | Freq: Two times a day (BID) | ORAL | 0 refills | Status: DC

## 2019-05-18 NOTE — Progress Notes (Signed)
Virtual Visit via Telephone Note  I connected with Bonnie [name] on 05/18/19 at 11:00 AM EDT by telephone and verified that I am speaking with the correct person using two identifiers.   I discussed the limitations, risks, security and privacy concerns of performing an evaluation and management service by telephone and the availability of in person appointments. I also discussed with the patient that there may be a patient responsible charge related to this service. The patient expressed understanding and agreed to proceed.   History of Present Illness: HPI:                                                                Bonnie [name] is a 68 y.o. female   CC: anxiety  Mood - depressed, sad, more tearful, denies suicidal ideations Anxiety - anxious all the time, reports feeling restless and wringing her hands, feels overwhelmed and does not like the uncertainty or lack of control over the events around her Sleep - self-discontinued Trazodone 3-4 months ago because 50 mg dose was not effective; reports sleep was fine up until the last 2 months. Having trouble fall asleep, taking 3-4 hours to fall asleep, feels like she is tossing and turning for hours.  Endorses racing and intrusive thoughts at night.  No nighttime awakenings or premature awakening Stressors: Anxiety about COVID-19 pandemic, cancellation of family reunion/living far away from support system, passing of beloved dogs,   Past Medical History:  Diagnosis Date  . Abnormal mammogram of right breast 01/20/2018  . Benign cyst of right breast 01/26/2018  . Centrilobular emphysema (HCC) 07/25/2018  . Chronic kidney disease, stage 3a (HCC) 12/31/2017  . Chronic low back pain   . Colon polyps   . Depression   . Fatty liver   . Hypertension   . Melanoma in situ of right upper extremity (HCC)   . Osteoarthritis of lumbar spine   . Renal cancer (HCC)   . Skin cancer   . Sphincter of Oddi dysfunction    Past Surgical History:  Procedure  Laterality Date  . ABDOMINAL HYSTERECTOMY    . BILE DUCT STENT PLACEMENT    . CHOLECYSTECTOMY    . LUMBAR FUSION    . MOHS SURGERY     x 11  . PARTIAL NEPHRECTOMY     Social History   Tobacco Use  . Smoking status: Former Smoker    Packs/day: 1.00    Years: 40.00    Pack years: 40.00    Types: Cigarettes    Last attempt to quit: 12/21/2010    Years since quitting: 8.4  . Smokeless tobacco: Never Used  Substance Use Topics  . Alcohol use: Yes    Alcohol/week: 1.0 - 2.0 standard drinks    Types: 1 - 2 Standard drinks or equivalent per week   family history includes Alcohol abuse in her sister, sister, son, and son; Depression in her mother, sister, and sister; Heart attack in her father; Hyperlipidemia in her father and mother; Hypertension in her father and mother.    ROS: negative except as noted in the HPI  Medications: Current Outpatient Medications  Medication Sig Dispense Refill  . ALPRAZolam (XANAX) 0.25 MG tablet Take 1 tablet (0.25 mg total) by mouth 3 (three) times daily as needed   for anxiety. 30 tablet 0  . aspirin 81 MG tablet Take by mouth.    . atorvastatin (LIPITOR) 40 MG tablet Take 1 tablet (40 mg total) by mouth daily. 90 tablet 3  . blood glucose meter kit and supplies KIT Check morning fasting blood glucose and up to four times daily as directed. 1 each 0  . buPROPion (WELLBUTRIN SR) 200 MG 12 hr tablet Take 1 tablet (200 mg total) by mouth daily. 90 tablet 1  . celecoxib (CELEBREX) 200 MG capsule Take 1 capsule (200 mg total) by mouth 2 (two) times daily with a meal. 60 capsule 3  . Cholecalciferol (VITAMIN D3) 2000 units TABS Take 1 tablet by mouth daily.    . enalapril (VASOTEC) 10 MG tablet TAKE 1 TABLET DAILY 90 tablet 4  . fluticasone (FLONASE) 50 MCG/ACT nasal spray Place into the nose.    . HYDROcodone-acetaminophen (NORCO/VICODIN) 5-325 MG tablet Take 1 tablet by mouth every 6 (six) hours as needed for moderate pain or severe pain. 60 tablet 0  .  Multiple Vitamin tablet Take 1 tablet by mouth.    . Omega-3 Fatty Acids (FISH OIL) 1000 MG CAPS Take by mouth.    . ONE TOUCH ULTRA TEST test strip     . ONETOUCH DELICA LANCETS 33G MISC     . Semaglutide, 1 MG/DOSE, (OZEMPIC, 1 MG/DOSE,) 2 MG/1.5ML SOPN Inject 1 mg into the skin once a week. 9 mL 1  . senna-docusate (SENOKOT-S) 8.6-50 MG tablet Take 2 tablets by mouth 2 (two) times daily. When taking hydrocodone 60 tablet 5  . triamterene-hydrochlorothiazide (MAXZIDE-25) 37.5-25 MG tablet Take 1 tablet by mouth daily. 90 tablet 1  . venlafaxine XR (EFFEXOR-XR) 75 MG 24 hr capsule Take 1 capsule (75 mg total) by mouth 2 (two) times a day. 180 capsule 0   No current facility-administered medications for this visit.    Allergies  Allergen Reactions  . Latex Itching  . Metformin And Related Other (See Comments)    Malaise, fatigue  . Morphine And Related Rash  . Tape Swelling and Other (See Comments)    Burning sensation  . Tramadol Palpitations    Heart fluttering       Objective:  There were no vitals taken for this visit. Neuro: alert and oriented x 3 Psych: cooperative, depressed mood, affect mood-congruent, tearful throughout phone call, speech is articulate, normal rate and volume; thought processes clear and goal-directed, normal judgment, good insight, no SI   Assessment and Plan: 68 y.o. female with   .Diagnoses and all orders for this visit:  Anxiety state -     ALPRAZolam (XANAX) 0.25 MG tablet; Take 1 tablet (0.25 mg total) by mouth 3 (three) times daily as needed for anxiety.  Moderate episode of recurrent major depressive disorder (HCC) -     venlafaxine XR (EFFEXOR-XR) 75 MG 24 hr capsule; Take 1 capsule (75 mg total) by mouth 2 (two) times a day.  Grief reaction   No acute safety issues Increase Venlafaxine to 75 mg bid Cont Wellbutrin 200 mg Adding low-dose Alprazolam 0.25 mg tid prn Counseled not to combine benzodiazepine with Norco. Counseled on  risks of accidental overdose, respiratory depression and death Deferred referral to counselor for now Suggested mindfulness and meditation such as head space app Counseled on sleep hygiene Recommendations reviewed with patient and sent via my chart Close follow-up in 1 week   Follow Up Instructions:    I discussed the assessment and treatment plan with   the patient. The patient was provided an opportunity to ask questions and all were answered. The patient agreed with the plan and demonstrated an understanding of the instructions.   The patient was advised to call back or seek an in-person evaluation if the symptoms worsen or if the condition fails to improve as anticipated.  I provided 11-20 minutes of non-face-to-face time during this encounter.   Charley Elizabeth Cummings, PA-C   

## 2019-05-23 ENCOUNTER — Ambulatory Visit: Payer: BLUE CROSS/BLUE SHIELD | Admitting: Physician Assistant

## 2019-05-24 ENCOUNTER — Encounter: Payer: Self-pay | Admitting: Physician Assistant

## 2019-05-25 ENCOUNTER — Encounter: Payer: Self-pay | Admitting: Physician Assistant

## 2019-05-25 ENCOUNTER — Ambulatory Visit: Payer: BC Managed Care – PPO | Admitting: Sports Medicine

## 2019-05-25 ENCOUNTER — Encounter: Payer: Self-pay | Admitting: Sports Medicine

## 2019-05-25 ENCOUNTER — Ambulatory Visit: Payer: BC Managed Care – PPO | Admitting: Physician Assistant

## 2019-05-25 VITALS — BP 137/85 | HR 107 | Wt 284.0 lb

## 2019-05-25 DIAGNOSIS — F4321 Adjustment disorder with depressed mood: Secondary | ICD-10-CM | POA: Diagnosis not present

## 2019-05-25 DIAGNOSIS — Z79899 Other long term (current) drug therapy: Secondary | ICD-10-CM

## 2019-05-25 DIAGNOSIS — F3341 Major depressive disorder, recurrent, in partial remission: Secondary | ICD-10-CM

## 2019-05-25 DIAGNOSIS — E1165 Type 2 diabetes mellitus with hyperglycemia: Secondary | ICD-10-CM

## 2019-05-25 DIAGNOSIS — E119 Type 2 diabetes mellitus without complications: Secondary | ICD-10-CM

## 2019-05-25 DIAGNOSIS — M1612 Unilateral primary osteoarthritis, left hip: Secondary | ICD-10-CM | POA: Diagnosis not present

## 2019-05-25 LAB — POCT GLYCOSYLATED HEMOGLOBIN (HGB A1C): HbA1c, POC (prediabetic range): 5.7 % (ref 5.7–6.4)

## 2019-05-25 NOTE — Progress Notes (Signed)
Subjective:    CC: Hip pain  HPI: This is a very pleasant 68 year old female with multifactorial left hip pain, referrable to the joint and the greater trochanteric bursa.  We injected her many months ago and she did extremely well.  Now having a recurrence of pain, worse over the bursa laterally, with a bit of groin pain as well, localized without radiation, worse with weightbearing and laying on the ipsilateral side.  Worsening pain present over the past several weeks.  I reviewed the past medical history, family history, social history, surgical history, and allergies today and no changes were needed.  Please see the problem list section below in epic for further details.  Past Medical History: Past Medical History:  Diagnosis Date  . Abnormal mammogram of right breast 01/20/2018  . Benign cyst of right breast 01/26/2018  . Centrilobular emphysema ([place]) 07/25/2018  . Chronic kidney disease, stage 3a ([place]) 12/31/2017  . Chronic low back pain   . Colon polyps   . Depression   . Fatty liver   . Hypertension   . Melanoma in situ of right upper extremity ([place])   . Osteoarthritis of lumbar spine   . Renal cancer ([place])   . Skin cancer   . Sphincter of Oddi dysfunction    Past Surgical History: Past Surgical History:  Procedure Laterality Date  . ABDOMINAL HYSTERECTOMY    . BILE DUCT STENT PLACEMENT    . CHOLECYSTECTOMY    . LUMBAR FUSION    . MOHS SURGERY     x 11  . PARTIAL NEPHRECTOMY     Social History: Social History   Socioeconomic History  . Marital status: Married    Spouse name: Not on file  . Number of children: Not on file  . Years of education: Not on file  . Highest education level: Not on file  Occupational History  . Not on file  Social Needs  . Financial resource strain: Not on file  . Food insecurity:    Worry: Not on file    Inability: Not on file  . Transportation needs:    Medical: Not on file    Non-medical: Not on file  Tobacco Use  . Smoking  status: Former Smoker    Packs/day: 1.00    Years: 40.00    Pack years: 40.00    Types: Cigarettes    Last attempt to quit: 12/21/2010    Years since quitting: 8.4  . Smokeless tobacco: Never Used  Substance and Sexual Activity  . Alcohol use: Yes    Alcohol/week: 1.0 - 2.0 standard drinks    Types: 1 - 2 Standard drinks or equivalent per week  . Drug use: No  . Sexual activity: Yes    Birth control/protection: Surgical, None  Lifestyle  . Physical activity:    Days per week: Not on file    Minutes per session: Not on file  . Stress: Not on file  Relationships  . Social connections:    Talks on phone: Not on file    Gets together: Not on file    Attends religious service: Not on file    Active member of club or organization: Not on file    Attends meetings of clubs or organizations: Not on file    Relationship status: Not on file  Other Topics Concern  . Not on file  Social History Narrative  . Not on file   Family History: Family History  Problem Relation Age of Onset  .  Depression Mother   . Hyperlipidemia Mother   . Hypertension Mother   . Heart attack Father   . Hyperlipidemia Father   . Hypertension Father   . Alcohol abuse Sister   . Depression Sister   . Alcohol abuse Son   . Alcohol abuse Son   . Alcohol abuse Sister   . Depression Sister    Allergies: Allergies  Allergen Reactions  . Latex Itching  . Metformin And Related Other (See Comments)    Malaise, fatigue  . Morphine And Related Rash  . Tape Swelling and Other (See Comments)    Burning sensation  . Tramadol Palpitations    Heart fluttering   Medications: See med rec.  Review of Systems: No fevers, chills, night sweats, weight loss, chest pain, or shortness of breath.   Objective:    General: Well Developed, well nourished, and in no acute distress.  Neuro: Alert and oriented x3, extra-ocular muscles intact, sensation grossly intact.  HEENT: Normocephalic, atraumatic, pupils equal  round reactive to light, neck supple, no masses, no lymphadenopathy, thyroid nonpalpable.  Skin: Warm and dry, no rashes. Cardiac: Regular rate and rhythm, no murmurs rubs or gallops, no lower extremity edema.  Respiratory: Clear to auscultation bilaterally. Not using accessory muscles, speaking in full sentences. Left hip: ROM IR: 38 Deg with pain, ER: 60 Deg, Flexion: 120 Deg, Extension: 100 Deg, Abduction: 45 Deg, Adduction: 45 Deg Strength IR: 5/5, ER: 5/5, Flexion: 5/5, Extension: 5/5, Abduction: 5/5, Adduction: 5/5 Pelvic alignment unremarkable to inspection and palpation. Standing hip rotation and gait without trendelenburg / unsteadiness. Greater trochanter with tenderness to palpation. No tenderness over piriformis. No SI joint tenderness and normal minimal SI movement.  Procedure: Real-time Ultrasound Guided injection of the left greater trochanteric bursa  device: GE Logiq E  Verbal informed consent obtained.  Time-out conducted.  Noted no overlying erythema, induration, or other signs of local infection.  Skin prepped in a sterile fashion.  Local anesthesia: Topical Ethyl chloride.  With sterile technique and under real time ultrasound guidance:  1 cc Kenalog 40, 2 cc lidocaine, 2 cc bupivacaine injected easily Completed without difficulty  Pain immediately resolved suggesting accurate placement of the medication.  Advised to call if fevers/chills, erythema, induration, drainage, or persistent bleeding.  Images permanently stored and available for review in the ultrasound unit.  Impression: Technically successful ultrasound guided injection.  Impression and Recommendations:    Primary osteoarthritis of left hip Pain from both the bursa and the joint, bursal pain was worse today, this was injected with ultrasound guidance. Last injection was approximately 6 months ago. Return to see me as needed.   ___________________________________________ Gwen Her. Dianah Field,  M.D., ABFM., CAQSM. Primary Care and Sports Medicine [facility]  Adjunct Professor of [employer]

## 2019-05-25 NOTE — Progress Notes (Signed)
HPI:                                                                Bonnie [name] is a 68 y.o. female who presents to [facility] [place] today for anxiety follow-up  I had a telephone visit with Bonnie [name] 2 weeks ago in which she expressed increased anxiety and sadness related to multiple stressors including anxiety about COVID-19 pandemic, cancellation of family reunion/living far away from support system, passing of her beloved dogs.    She states she is doing "10 times better than I was."  She states her sleep is slightly improved.  States "I tend to worry and do 1 falling asleep at night."  She was prescribed alprazolam 0.25 mg as needed in addition to her Wellbutrin 200 mg and Effexor 75 mg twice a day.  She reports she only needed to take half an alprazolam 3 times in the last 2 weeks.    Past Medical History:  Diagnosis Date  . Abnormal mammogram of right breast 01/20/2018  . Benign cyst of right breast 01/26/2018  . Centrilobular emphysema ([place]) 07/25/2018  . Chronic kidney disease, stage 3a ([place]) 12/31/2017  . Chronic low back pain   . Colon polyps   . Depression   . Fatty liver   . Hypertension   . Melanoma in situ of right upper extremity ([place])   . Osteoarthritis of lumbar spine   . Renal cancer ([place])   . Skin cancer   . Sphincter of Oddi dysfunction    Past Surgical History:  Procedure Laterality Date  . ABDOMINAL HYSTERECTOMY    . BILE DUCT STENT PLACEMENT    . CHOLECYSTECTOMY    . LUMBAR FUSION    . MOHS SURGERY     x 11  . PARTIAL NEPHRECTOMY     Social History   Tobacco Use  . Smoking status: Former Smoker    Packs/day: 1.00    Years: 40.00    Pack years: 40.00    Types: Cigarettes    Quit date: 12/21/2010    Years since quitting: 8.4  . Smokeless tobacco: Never Used  Substance Use Topics  . Alcohol use: Yes    Alcohol/week: 1.0 - 2.0 standard drinks    Types: 1 - 2 Standard drinks or equivalent per week   family  history includes Alcohol abuse in her sister, sister, son, and son; Depression in her mother, sister, and sister; Heart attack in her father; Hyperlipidemia in her father and mother; Hypertension in her father and mother.    ROS: negative except as noted in the HPI  Medications: Current Outpatient Medications  Medication Sig Dispense Refill  . ALPRAZolam (XANAX) 0.25 MG tablet Take 1 tablet (0.25 mg total) by mouth 3 (three) times daily as needed for anxiety. 30 tablet 0  . aspirin 81 MG tablet Take by mouth.    Marland Kitchen atorvastatin (LIPITOR) 40 MG tablet Take 1 tablet (40 mg total) by mouth daily. 90 tablet 3  . blood glucose meter kit and supplies KIT Check morning fasting blood glucose and up to four times daily as directed. 1 each 0  . buPROPion (WELLBUTRIN SR) 200 MG 12 hr tablet Take 1 tablet (200 mg total) by mouth daily.  90 tablet 1  . celecoxib (CELEBREX) 200 MG capsule Take 1 capsule (200 mg total) by mouth 2 (two) times daily with a meal. 60 capsule 3  . Cholecalciferol (VITAMIN D3) 2000 units TABS Take 1 tablet by mouth daily.    . enalapril (VASOTEC) 10 MG tablet TAKE 1 TABLET DAILY 90 tablet 4  . fluticasone (FLONASE) 50 MCG/ACT nasal spray Place into the nose.    Marland Kitchen HYDROcodone-acetaminophen (NORCO/VICODIN) 5-325 MG tablet Take 1 tablet by mouth every 6 (six) hours as needed for moderate pain or severe pain. 60 tablet 0  . Multiple Vitamin tablet Take 1 tablet by mouth.    . Omega-3 Fatty Acids (FISH OIL) 1000 MG CAPS Take by mouth.    . ONE TOUCH ULTRA TEST test strip     . ONETOUCH DELICA LANCETS 42A MISC     . Semaglutide, 1 MG/DOSE, (OZEMPIC, 1 MG/DOSE,) 2 MG/1.5ML SOPN Inject 1 mg into the skin once a week. 9 mL 1  . senna-docusate (SENOKOT-S) 8.6-50 MG tablet Take 2 tablets by mouth 2 (two) times daily. When taking hydrocodone 60 tablet 5  . triamterene-hydrochlorothiazide (MAXZIDE-25) 37.5-25 MG tablet Take 1 tablet by mouth daily. 90 tablet 1  . venlafaxine XR (EFFEXOR-XR)  75 MG 24 hr capsule Take 1 capsule (75 mg total) by mouth 2 (two) times a day. 180 capsule 0   [name] current facility-administered medications for this visit.    Allergies  Allergen Reactions  . Latex Itching  . Metformin And Related Other (See Comments)    Malaise, fatigue  . Morphine And Related Rash  . Tape Swelling and Other (See Comments)    Burning sensation  . Tramadol Palpitations    Heart fluttering       Objective:  BP 137/85   [name] (!) 107   Wt 284 lb (128.8 kg)   BMI 40.75 kg/m  Vitals:   05/25/19 1128  BP: 137/85  [name]: (!) 107  Gen:  alert, not ill-appearing, [name] distress, appropriate for age 69: head normocephalic without obvious abnormality, conjunctiva and cornea clear, trachea midline Pulm: Normal work of breathing, normal phonation, clear to auscultation bilaterally, [name] wheezes, rales or rhonchi CV: Normal rate approx 90 bpm, regular rhythm, s1 and s2 distinct, [name] murmurs, clicks or rubs  Neuro: alert and oriented x 3 Psych: cooperative, euthymic mood, affect mood-congruent, speech is articulate, normal rate and volume; thought processes clear and goal-directed, normal judgment, good insight   Diabetic Foot Exam - Simple   Simple Foot Form Diabetic Foot exam was performed with the following findings: Yes 05/25/2019 11:37 AM  Visual Inspection See comments: Yes Sensation Testing Intact to touch and monofilament testing bilaterally: Yes [name] Check Posterior Tibialis and Dorsalis [name] intact bilaterally: Yes Comments [name] ulcers/wounds. Skin is dry and callused     Wt Readings from Last 3 Encounters:  05/25/19 284 lb (128.8 kg)  05/25/19 284 lb (128.8 kg)  01/17/19 284 lb (128.8 kg)   BP Readings from Last 3 Encounters:  05/25/19 137/85  05/25/19 137/85  01/17/19 125/80   [name] Readings from Last 3 Encounters:  05/25/19 (!) 107  05/25/19 (!) 107  01/17/19 83   Lab Results  Component Value Date   HGBA1C 5.7 05/25/2019   Lab Results   Component Value Date   CREATININE 1.06 (H) 01/17/2019   BUN 19 01/17/2019   NA 141 01/17/2019   K 4.1 01/17/2019   CL 105 01/17/2019   CO2 27 01/17/2019   Lab  Results  Component Value Date   ALT 25 07/06/2018   AST 19 07/06/2018   BILITOT 0.4 07/06/2018   Lab Results  Component Value Date   WBC 5.5 01/17/2019   HGB 12.2 01/17/2019   HCT 36.8 01/17/2019   MCV 89.5 01/17/2019   PLT 358 01/17/2019   Lab Results  Component Value Date   CHOL 173 01/17/2019   HDL 55 01/17/2019   LDLCALC 97 01/17/2019   TRIG 115 01/17/2019   CHOLHDL 3.1 01/17/2019   The 10-year ASCVD risk score Mikey Bussing DC Jr., et al., 2013) is: 20.2%   Values used to calculate the score:     Age: 60 years     Sex: Female     Is Non-Hispanic African American: [name]     Diabetic: Yes     Tobacco smoker: [name]     Systolic Blood Pressure: 000 mmHg     Is BP treated: Yes     HDL Cholesterol: 55 mg/dL     Total Cholesterol: 173 mg/dL   Assessment and Plan: 68 y.o. female with   .Eltha was seen today for follow-up.  Diagnoses and all orders for this visit:  Grief reaction  Controlled substance agreement signed  Type 2 diabetes mellitus with hyperglycemia, without long-term current use of insulin ([place]) -     POCT HgB A1C  Encounter for diabetic foot exam ([place])   Type 2 diabetes A1C 5.7 today Well-controlled on Ozempic monotherapy.  She has not had any significant weight loss but has been able to maintain her weight Foot exam performed in office today and normal LDL goal less than 70, continue high intensity statin therapy BP goal less than 140/90, continue ACE and Maxzide  MDD in partial remission, grief reaction Improved from 2 weeks ago Continue Wellbutrin SR 200 mg daily and Effexor 75 mg twice daily and alprazolam 0.25 mg daily as needed for breakthrough anxiety  Patient education and anticipatory guidance given Patient agrees with treatment plan Follow-up in 3 months or sooner as needed if  symptoms worsen or fail to improve  Darlyne Russian PA-C

## 2019-05-25 NOTE — Progress Notes (Signed)
Gi c

## 2019-05-25 NOTE — Assessment & Plan Note (Signed)
Pain from both the bursa and the joint, bursal pain was worse today, this was injected with ultrasound guidance. Last injection was approximately 6 months ago. Return to see me as needed.

## 2019-05-25 NOTE — Patient Instructions (Addendum)
Diabetes Preventive Care: - annual foot exam  - annual dilated eye exam with an eye doctor - self foot exams at least weekly - pneumonia vaccine once (booster in 5 years and at age 68) - annual influenza vaccine - twice yearly dental cleanings and yearly exam - goal blood pressure <140/90, ideally <130/80 - LDL cholesterol <70 - A1C <7.0 - body mass index (BMI) <25.0 - follow-up every 3 months if your A1C is not at goal - follow-up every 6 months if diabetes is well controlled   Coping With Loss, Adult People experience loss in many different ways throughout their lives. Events such as moving, changing jobs, and losing friends can create a sense of loss. The loss may be as serious as a major health change, divorce, death of a pet, or death of a loved one. All of these types of loss are likely to create a physical and emotional reaction known as grief. Grief is the result of a major change or an absence of something or someone that you count on. Grief is a normal reaction to loss. How to recognize changes A variety of factors can affect your grieving experience, including:  The nature of your loss.  Your relationship to what or whom you lost.  Your understanding of grief and how to cope with it.  Your support system. The way that you deal with your grief will affect your ability to function as you normally do. When you are grieving, you may experience:  Numbness, shock, sadness, anxiety, anger, denial, and guilt.  Thoughts about death.  Unexpected crying.  A physical sensation of emptiness in your gut.  Problems sleeping and eating.  Fatigue.  Loss of interest in normal activities.  Dreaming about or imagining seeing the person who died.  A need to remember what or whom you lost.  Difficulty thinking about anything other than your loss for a period of time.  Relief. If you have been expecting the loss for a while, you may feel a sense of relief when it happens. Where  to find support To get support for coping with loss:  Ask your health care provider for help and recommendations, such as grief counseling or therapy.  Think about joining a support group for people who are coping with loss. Follow these instructions at home:   Be patient with yourself and others. Allow the grieving process to happen, and remember that grieving takes time. ? It is likely that you may never feel completely done with some grief. You may find a way to move on while still cherishing memories and feelings about your loss. ? Accepting your loss is a process. It can take months or longer to adjust.  Express your feelings in healthy ways, such as: ? Talking with others about your loss. It may be helpful to find others who have had a similar loss, such as a support group. ? Writing down your feelings in a journal. ? Doing physical activities to release stress and emotional energy. ? Doing creative activities like painting, sculpting, or playing or listening to music. ? Practicing resilience. This is the ability to recover and adjust after facing challenges. Reading some resources that encourage resilience may help you to learn ways to practice those behaviors.  Keep to your normal routine as much as possible. If you have trouble focusing or doing normal activities, it is acceptable to take some time away from your normal routine.  Spend time with friends and loved ones.  Eat  a healthy diet, get plenty of sleep, and rest when you feel tired. Where to find more information You can find more information about coping with loss from:  American Society of Clinical Oncology: www.cancer.net  American Psychological Association: TVStereos.ch Contact a health care provider if:  Your grief is extreme and keeps getting worse.  You have ongoing grief that does not improve.  Your body shows symptoms of grief, such as illness.  You feel depressed, anxious, or lonely. Get help right  away if:  You have thoughts about hurting yourself or others. If you ever feel like you may hurt yourself or others, or have thoughts about taking your own life, get help right away. You can go to your nearest emergency department or call:  Your local emergency services (911 in the [place]  A suicide crisis helpline, such as the [place] at 760-215-5809. This is open 24 hours a day. Summary  Grief is a normal part of experiencing a loss. It is the result of a major change or an absence of something or someone that you count on.  The depth of grief and the period of recovery depend on the type of loss as well as your ability to adjust to the change and process your feelings.  Processing grief requires patience and a willingness to accept your feelings and talk about your loss with people who are supportive.  It is important to find resources that work for you and to realize that we are all different when it comes to grief. There is not one single grieving process that works for everyone in the same way.  Be aware that when grief becomes extreme, it can lead to more severe issues like isolation, depression, anxiety, or suicidal thoughts. Talk with your health care provider if you have any of these issues. This information is not intended to replace advice given to you by your health care provider. Make sure you discuss any questions you have with your health care provider. Document Released: 04/22/2017 Document Revised: 04/22/2017 Document Reviewed: 04/22/2017 Elsevier Interactive Patient Education  2019 [employer].

## 2019-05-29 DIAGNOSIS — D1801 Hemangioma of skin and subcutaneous tissue: Secondary | ICD-10-CM | POA: Diagnosis not present

## 2019-05-29 DIAGNOSIS — L814 Other melanin hyperpigmentation: Secondary | ICD-10-CM | POA: Diagnosis not present

## 2019-05-29 DIAGNOSIS — Z85828 Personal history of other malignant neoplasm of skin: Secondary | ICD-10-CM | POA: Diagnosis not present

## 2019-05-29 DIAGNOSIS — Z85820 Personal history of malignant melanoma of skin: Secondary | ICD-10-CM | POA: Diagnosis not present

## 2019-05-29 DIAGNOSIS — L821 Other seborrheic keratosis: Secondary | ICD-10-CM | POA: Diagnosis not present

## 2019-06-12 DIAGNOSIS — F3341 Major depressive disorder, recurrent, in partial remission: Secondary | ICD-10-CM | POA: Insufficient documentation

## 2019-06-12 DIAGNOSIS — E119 Type 2 diabetes mellitus without complications: Secondary | ICD-10-CM | POA: Insufficient documentation

## 2019-06-28 ENCOUNTER — Encounter: Payer: Self-pay | Admitting: Physician Assistant

## 2019-06-29 ENCOUNTER — Encounter: Payer: Self-pay | Admitting: Physician Assistant

## 2019-06-29 ENCOUNTER — Other Ambulatory Visit: Payer: Self-pay

## 2019-06-29 ENCOUNTER — Ambulatory Visit: Payer: BC Managed Care – PPO | Admitting: Physician Assistant

## 2019-06-29 VITALS — BP 126/79 | HR 85 | Temp 98.1°F | Wt 290.0 lb

## 2019-06-29 DIAGNOSIS — R0602 Shortness of breath: Secondary | ICD-10-CM

## 2019-06-29 DIAGNOSIS — I499 Cardiac arrhythmia, unspecified: Secondary | ICD-10-CM | POA: Diagnosis not present

## 2019-06-29 DIAGNOSIS — R7989 Other specified abnormal findings of blood chemistry: Secondary | ICD-10-CM | POA: Diagnosis not present

## 2019-06-29 DIAGNOSIS — R9431 Abnormal electrocardiogram [ECG] [EKG]: Secondary | ICD-10-CM

## 2019-06-29 DIAGNOSIS — R918 Other nonspecific abnormal finding of lung field: Secondary | ICD-10-CM

## 2019-06-29 DIAGNOSIS — J432 Centrilobular emphysema: Secondary | ICD-10-CM

## 2019-06-29 DIAGNOSIS — I4510 Unspecified right bundle-branch block: Secondary | ICD-10-CM

## 2019-06-29 DIAGNOSIS — K834 Spasm of sphincter of Oddi: Secondary | ICD-10-CM

## 2019-06-29 DIAGNOSIS — R6889 Other general symptoms and signs: Secondary | ICD-10-CM

## 2019-06-29 DIAGNOSIS — Z8249 Family history of ischemic heart disease and other diseases of the circulatory system: Secondary | ICD-10-CM | POA: Insufficient documentation

## 2019-06-29 NOTE — Patient Instructions (Addendum)

## 2019-06-29 NOTE — Telephone Encounter (Signed)
Pt scheduled  

## 2019-06-29 NOTE — Progress Notes (Signed)
HPI:                                                                Bonnie [name] is a 68 y.o. female who presents to [facility]: [place] today for palpitations/irregular heart beats  Patient with PMH HTN, CAD, OSA, DM2 reports "arrhythmia" for the last 5-6 days. She describes a "flutter" and "flip flopping" sensation followed by a "pause and a beat that was bigger." Symptoms occur at rest and persistent throughout the day, but resolved today. She states she has had these symptoms in the past, but they have never persisted like this. No associated chest pain, lightheadedness, dizziness, diaphoresis, pre-syncope/syncope. Admits to drinking caffeine, typically 1-2 cups of coffee and an occasional iced tea or 6 ounce Coke during the day. She believes she has been drinking more caffeinated beverages due to the warm summer months.  She has noticed that she gets out of breathe easier with walking and can only walk a block without needing to rest. She has a history of OSA on CPAP, chronic dyspnea and multiple pulmonary nodules. Last CT chest was 07/2018 showed mild centrilobular emphysema and 4 nodules. Follow-up CT in 6-12 months was recommended. She also underwent PFT's 10/2018 which showed mildly reduced FEV1/FVC and TLC with normal diffusing capacity.  Patient reports prior cardiac work-up in 2009, which she states was normal (completed at outside facility, no records)  Family history significant for CHF in father   Past Medical History:  Diagnosis Date  . Abnormal mammogram of right breast 01/20/2018  . Benign cyst of right breast 01/26/2018  . Centrilobular emphysema ([place]) 07/25/2018  . Chronic kidney disease, stage 3a ([place]) 12/31/2017  . Chronic low back pain   . Colon polyps   . Depression   . Fatty liver   . Hypertension   . Melanoma in situ of right upper extremity ([place])   . Osteoarthritis of lumbar spine   . Renal cancer ([place])   . Skin cancer   .  Sphincter of Oddi dysfunction    Past Surgical History:  Procedure Laterality Date  . ABDOMINAL HYSTERECTOMY    . BILE DUCT STENT PLACEMENT    . CHOLECYSTECTOMY    . LUMBAR FUSION    . MOHS SURGERY     x 11  . PARTIAL NEPHRECTOMY     Social History   Tobacco Use  . Smoking status: Former Smoker    Packs/day: 1.00    Years: 40.00    Pack years: 40.00    Types: Cigarettes    Quit date: 12/21/2010    Years since quitting: 8.5  . Smokeless tobacco: Never Used  Substance Use Topics  . Alcohol use: Yes    Alcohol/week: 1.0 - 2.0 standard drinks    Types: 1 - 2 Standard drinks or equivalent per week   family history includes Alcohol abuse in her sister, sister, son, and son; Depression in her mother, sister, and sister; Heart attack in her father; Hyperlipidemia in her father and mother; Hypertension in her father and mother.    ROS: negative except as noted in the HPI  Medications: Current Outpatient Medications  Medication Sig Dispense Refill  . ALPRAZolam (XANAX) 0.25 MG tablet Take 1 tablet (0.25 mg total)  by mouth 3 (three) times daily as needed for anxiety. 30 tablet 0  . aspirin 81 MG tablet Take by mouth.    Marland Kitchen atorvastatin (LIPITOR) 40 MG tablet Take 1 tablet (40 mg total) by mouth daily. 90 tablet 3  . blood glucose meter kit and supplies KIT Check morning fasting blood glucose and up to four times daily as directed. 1 each 0  . buPROPion (WELLBUTRIN SR) 200 MG 12 hr tablet Take 1 tablet (200 mg total) by mouth daily. 90 tablet 1  . celecoxib (CELEBREX) 200 MG capsule Take 1 capsule (200 mg total) by mouth 2 (two) times daily with a meal. 60 capsule 3  . Cholecalciferol (VITAMIN D3) 2000 units TABS Take 1 tablet by mouth daily.    . enalapril (VASOTEC) 10 MG tablet TAKE 1 TABLET DAILY 90 tablet 4  . fluticasone (FLONASE) 50 MCG/ACT nasal spray Place into the nose.    Marland Kitchen HYDROcodone-acetaminophen (NORCO/VICODIN) 5-325 MG tablet Take 1 tablet by mouth every 6 (six) hours as  needed for moderate pain or severe pain. 60 tablet 0  . Multiple Vitamin tablet Take 1 tablet by mouth.    . Omega-3 Fatty Acids (FISH OIL) 1000 MG CAPS Take by mouth.    . ONE TOUCH ULTRA TEST test strip     . ONETOUCH DELICA LANCETS 46F MISC     . Semaglutide, 1 MG/DOSE, (OZEMPIC, 1 MG/DOSE,) 2 MG/1.5ML SOPN Inject 1 mg into the skin once a week. 9 mL 1  . senna-docusate (SENOKOT-S) 8.6-50 MG tablet Take 2 tablets by mouth 2 (two) times daily. When taking hydrocodone 60 tablet 5  . triamterene-hydrochlorothiazide (MAXZIDE-25) 37.5-25 MG tablet Take 1 tablet by mouth daily. 90 tablet 1  . venlafaxine XR (EFFEXOR-XR) 75 MG 24 hr capsule Take 1 capsule (75 mg total) by mouth 2 (two) times a day. 180 capsule 0   No current facility-administered medications for this visit.    Allergies  Allergen Reactions  . Latex Itching  . Metformin And Related Other (See Comments)    Malaise, fatigue  . Morphine And Related Rash  . Tape Swelling and Other (See Comments)    Burning sensation  . Tramadol Palpitations    Heart fluttering       Objective:  BP 126/79   Pulse 85   Temp 98.1 F (36.7 C) (Oral)   Wt 290 lb (131.5 kg)   SpO2 96%   BMI 41.61 kg/m  Gen:  alert, not ill-appearing, no distress, appropriate for age 67: head normocephalic without obvious abnormality, conjunctiva and cornea clear, trachea midline Pulm: Normal work of breathing, normal phonation, clear to auscultation bilaterally, no wheezes, rales or rhonchi CV: Normal rate, regular rhythm, s1 and s2 distinct, no murmurs, clicks or rubs  Neuro: alert and oriented x 3, no tremor MSK: extremities atraumatic, normal gait and station Skin: intact, no rashes on exposed skin, no jaundice, no cyanosis Psych: well-groomed, cooperative, good eye contact, euthymic mood, affect mood-congruent, speech is articulate, and thought processes clear and goal-directed  ECG 06/29/19 14:20 Vent rate 79 bpm PR-I 154 ms QRS 146 ms QT/QTc  424/486 ms Normal sinus rhythm Left axis RBBB Abnormal ECG  Other Result Information  Interface, Rad Results In - 07/22/2018 11:36 AM EDT CT CHEST WO CONTRAST, 07/22/2018 11:04 AM  INDICATION: \ K81.275 History of multiple pulmonary nodules  COMPARISON: None.  TECHNIQUE: Multislice axial images were obtained through the chest without administration of iodinated intravenous contrast material. Multi-planar reformatted images were  generated for additional analysis. Nongated technique limits cardiac detail.      All CT scans at [facility] and [facility] are performed using dose optimization techniques as appropriate to a performed exam, including but not limited to one or more of the following: automated exposure control, adjustment of the mA and/or kV according to patient size, use of iterative reconstruction technique. In addition, [facility] is participating in the [place] program which will further assist [place] in optimizing patient radiation exposure.   FINDINGS:  Thoracic inlet/central airways: Thyroid normal. Airway patent. Aorta and pulmonary arteries:Pulmonary arteries grossly normal for noncontrast technique. No acute aortic abnormality. Upper normal diameter of the ascending aorta at 39 mm. Aortic arch and branch vessel atherosclerotic calcification. Heart:Within normal limits. Mediastinum/hila/axilla: No adenopathy.  Hilar adenopathy not evaluated in the absence of contrast. Lungs/pleura: A few scattered micronodules are present, likely postinfectious/inflammatory. Mild centrilobular emphysema.   A. NODULE ID: 4     SEGMENT/LOBE: Left Lower Lobe     LOCATION: slice number 009     STATUS: Baseline     TYPE: Solid     AXIS LONG/SHORT: 7.6 mm / 6.1 mm     EQUIVALENT DIAMETER: 6.0 mm     VOLUME: 112 mm3     MASS: 82.6 mg   B. NODULE ID: 1      SEGMENT/LOBE: Right Lower Lobe      LOCATION: slice number [id number]      STATUS: Baseline       TYPE: Solid      AXIS LONG/SHORT: 7.3 mm / 5.2 mm      EQUIVALENT DIAMETER: 4.8 mm      VOLUME: 57 mm3      MASS: 42.5 mg   C. NODULE ID: 2      SEGMENT/LOBE: Right Lower Lobe      LOCATION: slice number 007      STATUS: Baseline      TYPE: Solid      AXIS LONG/SHORT: 5.4 mm / 3.2 mm      EQUIVALENT DIAMETER: 4.3 mm      VOLUME: 42 mm3      MASS: 26.5   D. NODULE ID: 3      SEGMENT/LOBE: Right Middle Lobe      LOCATION: slice number [id number]      STATUS: Baseline      TYPE: Solid      AXIS LONG/SHORT: 3.6 mm / 2.6 mm      EQUIVALENT DIAMETER: 2.7 mm      VOLUME: 10 mm3      MASS: 7.4 mg   Upper abdomen: Cystic lesion partially visible in the left upper renal pole. No acute upper abdominal abnormality. Chest wall/MSK: Harrington rods are present in the thoracolumbar spine with the L1 biconcave compression fracture with mild retropulsion, likely chronic and related to the fixation. No aggressive osseous lesions.  Technical note: This st udy was performed as a diagnostic chest CT rather than low-dose nodule CT for follow-up.  CONCLUSION:  1.  4 pulmonary nodules. The given history is multiple pulmonary nodules however without comparison, assessment of stability is not possible. Based on the largest nodule in the left lower lobe, a 6-12 month low-dose noncontrast chest CT nodule follow-up is recommended. 2.  Mild centrilobular emphysema.     No results found for this or any previous visit (from the past 72 hour(s)). No results found.    Assessment and Plan: 68 y.o.  female with   .Kylei was seen today for irregular heart beat.  Diagnoses and all orders for this visit:  Irregular heart beats -     EKG 12-Lead -     TSH + free T4 -     CBC -     BASIC METABOLIC PANEL WITH GFR -     ECHOCARDIOGRAM COMPLETE -     Ambulatory referral to Cardiology  Left axis deviation -     ECHOCARDIOGRAM COMPLETE -     Ambulatory referral to Cardiology  Decreased exercise  tolerance -     ECHOCARDIOGRAM COMPLETE -     CT Chest Wo Contrast  Right bundle branch block -     ECHOCARDIOGRAM COMPLETE -     Ambulatory referral to Cardiology  Abnormal QT interval present on electrocardiogram -     TSH + free T4 -     BASIC METABOLIC PANEL WITH GFR -     Hepatic function panel  Family history of CHF (congestive heart failure) Comments: Father   Multiple pulmonary nodules determined by computed tomography of lung -     CT Chest Wo Contrast  Sphincter of Oddi dysfunction -     Lipase -     CBC -     Hepatic function panel  Shortness of breath -     CT Chest Wo Contrast  Centrilobular emphysema (HCC)   Vitals reviewed. She is normotensive, no tachycardia, pulse ox 96% on RA at rest, no adventitious lung sounds Personally reviewed last Pulmonology note dated 10/25/18 and recent imaging results in Care Everywhere ECG personally reviewed by me and supervising physician showing NSR, RBBB and LAD, prolonged QTc Reviewed medications - possible QT prolonging medications include Effexor, Hydrochlorothiazide Due to change in exercise tolerance, will obtain Echo Due to hx of multiple pulmonary nodules and renal cell carcinoma, will also repeat CT chest (last CT 07/2018) Referring to Cardiology for further evaluation of arrhythmia/cardiac event monitoring CBC, TSH and BMP pending Will also obtain labs that are pended for GI to monitor hepatic function   Patient education and anticipatory guidance given Patient agrees with treatment plan Follow-up based on test results or sooner as needed if symptoms worsen or fail to improve  Darlyne Russian PA-C

## 2019-06-30 ENCOUNTER — Encounter (HOSPITAL_COMMUNITY): Payer: Self-pay | Admitting: Physician Assistant

## 2019-06-30 LAB — CBC
HCT: 37.6 % (ref 35.0–45.0)
Hemoglobin: 12.3 g/dL (ref 11.7–15.5)
MCH: 29.3 pg (ref 27.0–33.0)
MCHC: 32.7 g/dL (ref 32.0–36.0)
MCV: 89.5 fL (ref 80.0–100.0)
MPV: 10.2 fL (ref 7.5–12.5)
Platelets: 359 10*3/uL (ref 140–400)
RBC: 4.2 10*6/uL (ref 3.80–5.10)
RDW: 13.1 % (ref 11.0–15.0)
WBC: 8.1 10*3/uL (ref 3.8–10.8)

## 2019-06-30 LAB — HEPATIC FUNCTION PANEL
AG Ratio: 1.5 (calc) (ref 1.0–2.5)
ALT: 20 U/L (ref 6–29)
AST: 12 U/L (ref 10–35)
Albumin: 4 g/dL (ref 3.6–5.1)
Alkaline phosphatase (APISO): 88 U/L (ref 37–153)
Bilirubin, Direct: 0.1 mg/dL (ref 0.0–0.2)
Globulin: 2.6 g/dL (calc) (ref 1.9–3.7)
Indirect Bilirubin: 0.3 mg/dL (calc) (ref 0.2–1.2)
Total Bilirubin: 0.4 mg/dL (ref 0.2–1.2)
Total Protein: 6.6 g/dL (ref 6.1–8.1)

## 2019-06-30 LAB — BASIC METABOLIC PANEL WITH GFR
BUN/Creatinine Ratio: 23 (calc) — ABNORMAL HIGH (ref 6–22)
BUN: 26 mg/dL — ABNORMAL HIGH (ref 7–25)
CO2: 27 mmol/L (ref 20–32)
Calcium: 9.3 mg/dL (ref 8.6–10.4)
Chloride: 103 mmol/L (ref 98–110)
Creat: 1.13 mg/dL — ABNORMAL HIGH (ref 0.50–0.99)
GFR, Est African American: 58 mL/min/{1.73_m2} — ABNORMAL LOW (ref >=60)
GFR, Est Non African American: 50 mL/min/{1.73_m2} — ABNORMAL LOW (ref >=60)
Glucose, Bld: 74 mg/dL (ref 65–99)
Potassium: 4.2 mmol/L (ref 3.5–5.3)
Sodium: 139 mmol/L (ref 135–146)

## 2019-06-30 LAB — LIPASE: Lipase: 76 U/L — ABNORMAL HIGH (ref 7–60)

## 2019-06-30 LAB — TSH+FREE T4: TSH W/REFLEX TO FT4: 1.91 mIU/L (ref 0.40–4.50)

## 2019-07-05 DIAGNOSIS — R918 Other nonspecific abnormal finding of lung field: Secondary | ICD-10-CM | POA: Insufficient documentation

## 2019-07-05 DIAGNOSIS — R9431 Abnormal electrocardiogram [ECG] [EKG]: Secondary | ICD-10-CM | POA: Insufficient documentation

## 2019-07-05 DIAGNOSIS — K834 Spasm of sphincter of Oddi: Secondary | ICD-10-CM | POA: Insufficient documentation

## 2019-07-05 DIAGNOSIS — I4510 Unspecified right bundle-branch block: Secondary | ICD-10-CM | POA: Insufficient documentation

## 2019-07-05 DIAGNOSIS — R6889 Other general symptoms and signs: Secondary | ICD-10-CM | POA: Insufficient documentation

## 2019-07-06 ENCOUNTER — Other Ambulatory Visit: Payer: Self-pay | Admitting: Physician Assistant

## 2019-07-06 DIAGNOSIS — I10 Essential (primary) hypertension: Secondary | ICD-10-CM

## 2019-07-06 DIAGNOSIS — F3341 Major depressive disorder, recurrent, in partial remission: Secondary | ICD-10-CM

## 2019-07-07 ENCOUNTER — Ambulatory Visit (HOSPITAL_COMMUNITY): Payer: BC Managed Care – PPO | Attending: Internal Medicine

## 2019-07-07 ENCOUNTER — Other Ambulatory Visit: Payer: Self-pay

## 2019-07-07 DIAGNOSIS — I499 Cardiac arrhythmia, unspecified: Secondary | ICD-10-CM | POA: Insufficient documentation

## 2019-07-07 DIAGNOSIS — R6889 Other general symptoms and signs: Secondary | ICD-10-CM | POA: Diagnosis not present

## 2019-07-07 DIAGNOSIS — I4510 Unspecified right bundle-branch block: Secondary | ICD-10-CM | POA: Diagnosis not present

## 2019-07-07 DIAGNOSIS — R9431 Abnormal electrocardiogram [ECG] [EKG]: Secondary | ICD-10-CM | POA: Insufficient documentation

## 2019-07-09 ENCOUNTER — Encounter: Payer: Self-pay | Admitting: Physician Assistant

## 2019-07-09 DIAGNOSIS — I519 Heart disease, unspecified: Secondary | ICD-10-CM

## 2019-07-09 HISTORY — DX: Heart disease, unspecified: I51.9

## 2019-07-10 NOTE — Progress Notes (Signed)
Referring-Charley Rob Bunting, PA-C Reason for referral-palpitations and dyspnea.  HPI: 68 year old female for evaluation of palpitations and dyspnea at request of Trixie Dredge, PA-C.  By history patient has a history of chronic dyspnea and has previously been found to have emphysema, pulmonary nodules and also has obstructive sleep apnea.  She had a nuclear study in [place] in 2013 that apparently showed no ischemia.  Labs July 2020 showed normal liver functions, hemoglobin 12.3 and normal TSH.  Echocardiogram July 2020 showed normal LV function.  Chest CT July 2020 showed small pulmonary nodules and follow-up recommended in 6 months.  There was aortic atherosclerosis.  Patient describes increasing dyspnea on exertion for approximately 1 year.  She also states that she has gained approximately 100 pounds since discontinuing smoking in 2012.  She denies orthopnea, PND, pedal edema, chest pain or syncope.  She has had intermittent palpitations for years.  They recently were increased over a 1 week timeframe but have improved.  Cardiology now asked to evaluate.  Current Outpatient Medications  Medication Sig Dispense Refill  . ALPRAZolam (XANAX) 0.25 MG tablet Take 1 tablet (0.25 mg total) by mouth 3 (three) times daily as needed for anxiety. 30 tablet 0  . aspirin 81 MG tablet Take by mouth.    Marland Kitchen atorvastatin (LIPITOR) 40 MG tablet Take 1 tablet (40 mg total) by mouth daily. 90 tablet 3  . blood glucose meter kit and supplies KIT Check morning fasting blood glucose and up to four times daily as directed. 1 each 0  . buPROPion (WELLBUTRIN SR) 200 MG 12 hr tablet Take 1 tablet (200 mg total) by mouth daily. 90 tablet 1  . celecoxib (CELEBREX) 200 MG capsule Take 1 capsule (200 mg total) by mouth 2 (two) times daily with a meal. 60 capsule 3  . Cholecalciferol (VITAMIN D3) 2000 units TABS Take 1 tablet by mouth daily.    . enalapril (VASOTEC) 10 MG tablet TAKE 1 TABLET DAILY  90 tablet 4  . fluticasone (FLONASE) 50 MCG/ACT nasal spray Place into the nose.    Marland Kitchen HYDROcodone-acetaminophen (NORCO/VICODIN) 5-325 MG tablet Take 1 tablet by mouth every 6 (six) hours as needed for moderate pain or severe pain. 60 tablet 0  . Multiple Vitamin tablet Take 1 tablet by mouth.    . Omega-3 Fatty Acids (FISH OIL) 1000 MG CAPS Take by mouth.    . ONE TOUCH ULTRA TEST test strip     . ONETOUCH DELICA LANCETS 44I MISC     . Semaglutide, 1 MG/DOSE, (OZEMPIC, 1 MG/DOSE,) 2 MG/1.5ML SOPN Inject 1 mg into the skin once a week. 9 mL 1  . senna-docusate (SENOKOT-S) 8.6-50 MG tablet Take 2 tablets by mouth 2 (two) times daily. When taking hydrocodone 60 tablet 5  . triamterene-hydrochlorothiazide (MAXZIDE-25) 37.5-25 MG tablet TAKE 1 TABLET DAILY 90 tablet 3  . venlafaxine XR (EFFEXOR-XR) 75 MG 24 hr capsule Take 1 capsule (75 mg total) by mouth 2 (two) times a day. 180 capsule 0   No current facility-administered medications for this visit.     Allergies  Allergen Reactions  . Latex Itching  . Metformin And Related Other (See Comments)    Malaise, fatigue  . Morphine And Related Rash  . Tape Swelling and Other (See Comments)    Burning sensation  . Tramadol Palpitations    Heart fluttering     Past Medical History:  Diagnosis Date  . Abnormal mammogram of right breast 01/20/2018  . Benign  cyst of right breast 01/26/2018  . Centrilobular emphysema ([place]) 07/25/2018  . Chronic kidney disease, stage 3a ([place]) 12/31/2017  . Chronic low back pain   . Colon polyps   . Depression   . Fatty liver   . Hypertension   . Melanoma in situ of right upper extremity ([place])   . Mild diastolic dysfunction 6/43/3295  . Osteoarthritis of lumbar spine   . Renal cancer ([place])   . Skin cancer   . Sphincter of Oddi dysfunction     Past Surgical History:  Procedure Laterality Date  . ABDOMINAL HYSTERECTOMY    . BILE DUCT STENT PLACEMENT    . CHOLECYSTECTOMY    . LUMBAR FUSION    . MOHS  SURGERY     x 11  . PARTIAL NEPHRECTOMY      Social History   Socioeconomic History  . Marital status: Married    Spouse name: Not on file  . Number of children: Not on file  . Years of education: Not on file  . Highest education level: Not on file  Occupational History  . Not on file  Social Needs  . Financial resource strain: Not on file  . Food insecurity    Worry: Not on file    Inability: Not on file  . Transportation needs    Medical: Not on file    Non-medical: Not on file  Tobacco Use  . Smoking status: Former Smoker    Packs/day: 1.00    Years: 40.00    Pack years: 40.00    Types: Cigarettes    Quit date: 12/21/2010    Years since quitting: 8.5  . Smokeless tobacco: Never Used  Substance and Sexual Activity  . Alcohol use: Yes    Alcohol/week: 1.0 - 2.0 standard drinks    Types: 1 - 2 Standard drinks or equivalent per week  . Drug use: No  . Sexual activity: Yes    Birth control/protection: Surgical, None  Lifestyle  . Physical activity    Days per week: Not on file    Minutes per session: Not on file  . Stress: Not on file  Relationships  . Social Herbalist on phone: Not on file    Gets together: Not on file    Attends religious service: Not on file    Active member of club or organization: Not on file    Attends meetings of clubs or organizations: Not on file    Relationship status: Not on file  . Intimate partner violence    Fear of current or ex partner: Not on file    Emotionally abused: Not on file    Physically abused: Not on file    Forced sexual activity: Not on file  Other Topics Concern  . Not on file  Social History Narrative  . Not on file    Family History  Problem Relation Age of Onset  . Depression Mother   . Hyperlipidemia Mother   . Hypertension Mother   . Heart attack Father   . Hyperlipidemia Father   . Hypertension Father   . Alcohol abuse Sister   . Depression Sister   . Alcohol abuse Son   . Alcohol  abuse Son   . Alcohol abuse Sister   . Depression Sister     ROS: no fevers or chills, productive cough, hemoptysis, dysphasia, odynophagia, melena, hematochezia, dysuria, hematuria, rash, seizure activity, orthopnea, PND, pedal edema, claudication. Remaining systems are negative.  Physical  Exam:   Blood pressure 132/78, pulse (!) 107, height _0  (1.778 m), weight 288 lb (130.6 kg).  General:  Well developed/obese in NAD Skin warm/dry Patient not depressed No peripheral clubbing Back-normal HEENT-normal/normal eyelids Neck supple/normal carotid upstroke bilaterally; no bruits; no JVD; no thyromegaly chest - CTA/ normal expansion CV - RRR/normal S1 and S2; no murmurs, rubs or gallops;  PMI nondisplaced Abdomen -NT/ND, no HSM, no mass, + bowel sounds, no bruit 2+ femoral pulses, no bruits Ext-no edema, chords, 2+ DP Neuro-grossly nonfocal  ECG -sinus tachycardia at a rate of 107, left anterior fascicular block, right bundle branch block.  Personally reviewed  A/P  1 palpitations-patient has had intermittent palpitations for years.  She recently had more prolonged symptoms over a 1 week.  But now has improved.  We can consider a beta-blocker in the future if needed.  LV function is normal.  2 dyspnea-etiology unclear.  I will arrange a [place] nuclear study to screen for coronary disease.  Likely contribution from obesity hypoventilation syndrome, obstructive sleep apnea (she now uses CPAP) and deconditioning.  3 hypertension-blood pressure controlled.  Continue present medications and follow.  4 hyperlipidemia-continue statin.  5 history of aortic atherosclerosis-continue aspirin and statin.  Kirk Ruths, MD

## 2019-07-11 ENCOUNTER — Other Ambulatory Visit: Payer: Self-pay

## 2019-07-11 ENCOUNTER — Ambulatory Visit (INDEPENDENT_AMBULATORY_CARE_PROVIDER_SITE_OTHER): Payer: BC Managed Care – PPO

## 2019-07-11 DIAGNOSIS — R0602 Shortness of breath: Secondary | ICD-10-CM

## 2019-07-11 DIAGNOSIS — R6889 Other general symptoms and signs: Secondary | ICD-10-CM

## 2019-07-11 DIAGNOSIS — R918 Other nonspecific abnormal finding of lung field: Secondary | ICD-10-CM

## 2019-07-19 ENCOUNTER — Ambulatory Visit (INDEPENDENT_AMBULATORY_CARE_PROVIDER_SITE_OTHER): Payer: BC Managed Care – PPO | Admitting: Cardiology

## 2019-07-19 ENCOUNTER — Other Ambulatory Visit: Payer: Self-pay

## 2019-07-19 ENCOUNTER — Encounter: Payer: Self-pay | Admitting: Cardiology

## 2019-07-19 VITALS — BP 132/78 | HR 107 | Ht 70.0 in | Wt 288.0 lb

## 2019-07-19 DIAGNOSIS — I2510 Atherosclerotic heart disease of native coronary artery without angina pectoris: Secondary | ICD-10-CM | POA: Diagnosis not present

## 2019-07-19 DIAGNOSIS — R072 Precordial pain: Secondary | ICD-10-CM

## 2019-07-19 DIAGNOSIS — E7800 Pure hypercholesterolemia, unspecified: Secondary | ICD-10-CM

## 2019-07-19 DIAGNOSIS — I10 Essential (primary) hypertension: Secondary | ICD-10-CM | POA: Diagnosis not present

## 2019-07-19 NOTE — Patient Instructions (Signed)
Medication Instructions:  NO CHANGE If you need a refill on your cardiac medications before your next appointment, please call your pharmacy.   Lab work: If you have labs (blood work) drawn today and your tests are completely normal, you will receive your results only by: Marland Kitchen MyChart Message (if you have MyChart) OR . A paper copy in the mail If you have any lab test that is abnormal or we need to change your treatment, we will call you to review the results.  Testing/Procedures: Your physician has requested that you have a lexiscan myoview. For further information please visit [email or web address]. Please follow instruction sheet, as given.[place]    Follow-Up: At [facility], you and your health needs are our priority.  As part of our continuing mission to provide you with exceptional heart care, we have created designated Provider Care Teams.  These Care Teams include your primary Cardiologist (physician) and Advanced Practice Providers (APPs -  Physician Assistants and Nurse Practitioners) who all work together to provide you with the care you need, when you need it. Your physician recommends that you schedule a follow-up appointment in: AS NEEDED PENDING TEST RESULTS

## 2019-07-27 ENCOUNTER — Telehealth (HOSPITAL_COMMUNITY): Payer: Self-pay

## 2019-07-27 NOTE — Telephone Encounter (Signed)
Encounter complete. 

## 2019-07-31 ENCOUNTER — Other Ambulatory Visit: Payer: Self-pay | Admitting: Physician Assistant

## 2019-07-31 DIAGNOSIS — M47816 Spondylosis without myelopathy or radiculopathy, lumbar region: Secondary | ICD-10-CM

## 2019-07-31 DIAGNOSIS — M25552 Pain in left hip: Secondary | ICD-10-CM

## 2019-07-31 DIAGNOSIS — M170 Bilateral primary osteoarthritis of knee: Secondary | ICD-10-CM

## 2019-08-01 ENCOUNTER — Inpatient Hospital Stay (HOSPITAL_COMMUNITY): Admission: RE | Admit: 2019-08-01 | Payer: BC Managed Care – PPO | Source: Ambulatory Visit

## 2019-08-03 ENCOUNTER — Other Ambulatory Visit: Payer: Self-pay

## 2019-08-03 DIAGNOSIS — Z79891 Long term (current) use of opiate analgesic: Secondary | ICD-10-CM

## 2019-08-03 DIAGNOSIS — M159 Polyosteoarthritis, unspecified: Secondary | ICD-10-CM

## 2019-08-03 MED ORDER — HYDROCODONE-ACETAMINOPHEN 5-325 MG PO TABS: 1.0000 | ORAL_TABLET | Freq: Four times a day (QID) | ORAL | 0 refills | Status: DC | PRN

## 2019-08-04 ENCOUNTER — Telehealth (HOSPITAL_COMMUNITY): Payer: Self-pay

## 2019-08-04 NOTE — Telephone Encounter (Signed)
Encounter complete. 

## 2019-08-07 ENCOUNTER — Encounter: Payer: Self-pay | Admitting: Physician Assistant

## 2019-08-08 NOTE — Telephone Encounter (Signed)
Approved today ([employer]) CaseId:[id number];Status:Approved;Review Type:Prior Auth;Coverage Start Date:07/09/2019;Coverage End Date:08/07/2020. Pharmacy aware.

## 2019-08-10 ENCOUNTER — Other Ambulatory Visit: Payer: Self-pay

## 2019-08-10 ENCOUNTER — Ambulatory Visit (HOSPITAL_COMMUNITY)
Admission: RE | Admit: 2019-08-10 | Discharge: 2019-08-10 | Disposition: A | Discharge status: Home / Self Care | Payer: BC Managed Care – PPO | Source: Ambulatory Visit | Attending: Cardiology | Admitting: Cardiology

## 2019-08-10 DIAGNOSIS — R072 Precordial pain: Secondary | ICD-10-CM | POA: Diagnosis not present

## 2019-08-10 LAB — MYOCARDIAL PERFUSION IMAGING
LV dias vol: 98 mL (ref 46–106)
LV sys vol: 41 mL
Peak HR: 90 {beats}/min
Rest HR: 75 {beats}/min
SDS: 3
SRS: 2
SSS: 5
TID: 1.2

## 2019-08-10 MED ORDER — TECHNETIUM TC 99M TETROFOSMIN IV KIT
30.4000 | PACK | Freq: Once | INTRAVENOUS | Status: AC | PRN
  Administered 2019-08-10: 30.4 via INTRAVENOUS
  Filled 2019-08-10: qty 31

## 2019-08-10 MED ORDER — TECHNETIUM TC 99M TETROFOSMIN IV KIT
10.2000 | PACK | Freq: Once | INTRAVENOUS | Status: AC | PRN
  Administered 2019-08-10: 10.2 via INTRAVENOUS
  Filled 2019-08-10: qty 11

## 2019-08-10 MED ORDER — REGADENOSON 0.4 MG/5ML IV SOLN
0.4000 mg | Freq: Once | INTRAVENOUS | Status: AC
  Administered 2019-08-10: 0.4 mg via INTRAVENOUS

## 2019-08-10 MED ORDER — AMINOPHYLLINE 25 MG/ML IV SOLN
75.0000 mg | Freq: Once | INTRAVENOUS | Status: AC
  Administered 2019-08-10: 75 mg via INTRAVENOUS

## 2019-08-18 NOTE — Progress Notes (Signed)
HPI: FU dyspnea, palpitations and CP.  By history patient has a history of chronic dyspnea and has previously been found to have emphysema, pulmonary nodules and also has obstructive sleep apnea.  She had a nuclear study in [place] in 2013 that apparently showed no ischemia.  Labs July 2020 showed normal liver functions, hemoglobin 12.3 and normal TSH.  Echocardiogram July 2020 showed normal LV function.  Chest CT July 2020 showed small pulmonary nodules and follow-up recommended in 6 months. Nuclear study 8/20 showed EF 58 and ischemia in the inferior wall, anterior wall and apex. Since last seen, she has dyspnea on exertion which continues.  No orthopnea, PND, pedal edema, chest pain or syncope.  Current Outpatient Medications  Medication Sig Dispense Refill  . ALPRAZolam (XANAX) 0.25 MG tablet Take 1 tablet (0.25 mg total) by mouth 3 (three) times daily as needed for anxiety. 30 tablet 0  . aspirin 81 MG tablet Take by mouth.    Marland Kitchen atorvastatin (LIPITOR) 40 MG tablet Take 1 tablet (40 mg total) by mouth daily. 90 tablet 3  . blood glucose meter kit and supplies KIT Check morning fasting blood glucose and up to four times daily as directed. 1 each 0  . buPROPion (WELLBUTRIN SR) 200 MG 12 hr tablet Take 1 tablet (200 mg total) by mouth daily. 90 tablet 1  . celecoxib (CELEBREX) 200 MG capsule TAKE 1 CAPSULE TWICE A DAY WITH MEALS 60 capsule 11  . Cholecalciferol (VITAMIN D3) 2000 units TABS Take 1 tablet by mouth daily.    . enalapril (VASOTEC) 10 MG tablet TAKE 1 TABLET DAILY 90 tablet 4  . fluticasone (FLONASE) 50 MCG/ACT nasal spray Place into the nose.    Marland Kitchen HYDROcodone-acetaminophen (NORCO/VICODIN) 5-325 MG tablet Take 1 tablet by mouth every 6 (six) hours as needed for moderate pain or severe pain. 60 tablet 0  . Multiple Vitamin tablet Take 1 tablet by mouth.    . Omega-3 Fatty Acids (FISH OIL) 1000 MG CAPS Take by mouth.    . ONE TOUCH ULTRA TEST test strip     . ONETOUCH DELICA  LANCETS 48N MISC     . Semaglutide, 1 MG/DOSE, (OZEMPIC, 1 MG/DOSE,) 2 MG/1.5ML SOPN Inject 1 mg into the skin once a week. 9 mL 1  . senna-docusate (SENOKOT-S) 8.6-50 MG tablet Take 2 tablets by mouth 2 (two) times daily. When taking hydrocodone 60 tablet 5  . triamterene-hydrochlorothiazide (MAXZIDE-25) 37.5-25 MG tablet TAKE 1 TABLET DAILY 90 tablet 3  . venlafaxine XR (EFFEXOR-XR) 75 MG 24 hr capsule Take 1 capsule (75 mg total) by mouth 2 (two) times a day. (Patient taking differently: Take 75 mg by mouth daily with breakfast. ) 180 capsule 0   No current facility-administered medications for this visit.      Past Medical History:  Diagnosis Date  . Abnormal mammogram of right breast 01/20/2018  . Benign cyst of right breast 01/26/2018  . Centrilobular emphysema ([place]) 07/25/2018  . Chronic kidney disease, stage 3a ([place]) 12/31/2017  . Chronic low back pain   . Colon polyps   . Depression   . Fatty liver   . Hypertension   . Melanoma in situ of right upper extremity ([place])   . Mild diastolic dysfunction 4/62/7035  . Osteoarthritis of lumbar spine   . Renal cancer ([place])   . Skin cancer   . Sphincter of Oddi dysfunction     Past Surgical History:  Procedure Laterality Date  .  ABDOMINAL HYSTERECTOMY    . BILE DUCT STENT PLACEMENT    . CHOLECYSTECTOMY    . LUMBAR FUSION    . MOHS SURGERY     x 11  . PARTIAL NEPHRECTOMY      Social History   Socioeconomic History  . Marital status: Married    Spouse name: Not on file  . Number of children: Not on file  . Years of education: Not on file  . Highest education level: Not on file  Occupational History  . Not on file  Social Needs  . Financial resource strain: Not on file  . Food insecurity    Worry: Not on file    Inability: Not on file  . Transportation needs    Medical: Not on file    Non-medical: Not on file  Tobacco Use  . Smoking status: Former Smoker    Packs/day: 1.00    Years: 40.00    Pack years: 40.00     Types: Cigarettes    Quit date: 12/21/2010    Years since quitting: 8.6  . Smokeless tobacco: Never Used  Substance and Sexual Activity  . Alcohol use: Yes    Alcohol/week: 1.0 - 2.0 standard drinks    Types: 1 - 2 Standard drinks or equivalent per week  . Drug use: No  . Sexual activity: Yes    Birth control/protection: Surgical, None  Lifestyle  . Physical activity    Days per week: Not on file    Minutes per session: Not on file  . Stress: Not on file  Relationships  . Social Herbalist on phone: Not on file    Gets together: Not on file    Attends religious service: Not on file    Active member of club or organization: Not on file    Attends meetings of clubs or organizations: Not on file    Relationship status: Not on file  . Intimate partner violence    Fear of current or ex partner: Not on file    Emotionally abused: Not on file    Physically abused: Not on file    Forced sexual activity: Not on file  Other Topics Concern  . Not on file  Social History Narrative  . Not on file    Family History  Problem Relation Age of Onset  . Depression Mother   . Hyperlipidemia Mother   . Hypertension Mother   . Heart attack Father   . Hyperlipidemia Father   . Hypertension Father   . Alcohol abuse Sister   . Depression Sister   . Alcohol abuse Son   . Alcohol abuse Son   . Alcohol abuse Sister   . Depression Sister     ROS: no fevers or chills, productive cough, hemoptysis, dysphasia, odynophagia, melena, hematochezia, dysuria, hematuria, rash, seizure activity, orthopnea, PND, pedal edema, claudication. Remaining systems are negative.  Physical Exam: Well-developed obese in no acute distress.  Skin is warm and dry.  HEENT is normal.  Neck is supple.  Chest is clear to auscultation with normal expansion.  Cardiovascular exam is regular rate and rhythm.  Abdominal exam nontender or distended. No masses palpated. Extremities show no edema. neuro grossly  intact  A/P  1 Abnormal nuclear study-personally reviewed; appears to show ischemia in the distal anterior wall, inferior wall and apex. Will plan cath; risks and benefits including MI, CVA and death discussed and she agrees to proceed.  2 Dyspnea-likely multifactoral including OHS, OSA  and deconditioning. Cannot R/o contribution from ischemia; plan cath as outlined above.  3 Palpitations-LV function normal; beta blocker in the future if needed.  4 hypertension-BP controlled; continue present meds and follow.  5 Hyperlipidemia-continue statin.  6 H/O aortic atherosclerosis-continue ASA and statin.  Kirk Ruths, MD

## 2019-08-18 NOTE — H&P (View-Only) (Signed)
    HPI: FU dyspnea, palpitations and CP.  By history patient has a history of chronic dyspnea and has previously been found to have emphysema, pulmonary nodules and also has obstructive sleep apnea.  She had a nuclear study in [place] in 2013 that apparently showed no ischemia.  Labs July 2020 showed normal liver functions, hemoglobin 12.3 and normal TSH.  Echocardiogram July 2020 showed normal LV function.  Chest CT July 2020 showed small pulmonary nodules and follow-up recommended in 6 months. Nuclear study 8/20 showed EF 58 and ischemia in the inferior wall, anterior wall and apex. Since last seen, she has dyspnea on exertion which continues.  No orthopnea, PND, pedal edema, chest pain or syncope.  Current Outpatient Medications  Medication Sig Dispense Refill  . ALPRAZolam (XANAX) 0.25 MG tablet Take 1 tablet (0.25 mg total) by mouth 3 (three) times daily as needed for anxiety. 30 tablet 0  . aspirin 81 MG tablet Take by mouth.    . atorvastatin (LIPITOR) 40 MG tablet Take 1 tablet (40 mg total) by mouth daily. 90 tablet 3  . blood glucose meter kit and supplies KIT Check morning fasting blood glucose and up to four times daily as directed. 1 each 0  . buPROPion (WELLBUTRIN SR) 200 MG 12 hr tablet Take 1 tablet (200 mg total) by mouth daily. 90 tablet 1  . celecoxib (CELEBREX) 200 MG capsule TAKE 1 CAPSULE TWICE A DAY WITH MEALS 60 capsule 11  . Cholecalciferol (VITAMIN D3) 2000 units TABS Take 1 tablet by mouth daily.    . enalapril (VASOTEC) 10 MG tablet TAKE 1 TABLET DAILY 90 tablet 4  . fluticasone (FLONASE) 50 MCG/ACT nasal spray Place into the nose.    . HYDROcodone-acetaminophen (NORCO/VICODIN) 5-325 MG tablet Take 1 tablet by mouth every 6 (six) hours as needed for moderate pain or severe pain. 60 tablet 0  . Multiple Vitamin tablet Take 1 tablet by mouth.    . Omega-3 Fatty Acids (FISH OIL) 1000 MG CAPS Take by mouth.    . ONE TOUCH ULTRA TEST test strip     . ONETOUCH DELICA  LANCETS 33G MISC     . Semaglutide, 1 MG/DOSE, (OZEMPIC, 1 MG/DOSE,) 2 MG/1.5ML SOPN Inject 1 mg into the skin once a week. 9 mL 1  . senna-docusate (SENOKOT-S) 8.6-50 MG tablet Take 2 tablets by mouth 2 (two) times daily. When taking hydrocodone 60 tablet 5  . triamterene-hydrochlorothiazide (MAXZIDE-25) 37.5-25 MG tablet TAKE 1 TABLET DAILY 90 tablet 3  . venlafaxine XR (EFFEXOR-XR) 75 MG 24 hr capsule Take 1 capsule (75 mg total) by mouth 2 (two) times a day. (Patient taking differently: Take 75 mg by mouth daily with breakfast. ) 180 capsule 0   No current facility-administered medications for this visit.      Past Medical History:  Diagnosis Date  . Abnormal mammogram of right breast 01/20/2018  . Benign cyst of right breast 01/26/2018  . Centrilobular emphysema ([facility]) 07/25/2018  . Chronic kidney disease, stage 3a ([facility]) 12/31/2017  . Chronic low back pain   . Colon polyps   . Depression   . Fatty liver   . Hypertension   . Melanoma in situ of right upper extremity ([facility])   . Mild diastolic dysfunction 07/09/2019  . Osteoarthritis of lumbar spine   . Renal cancer ([facility])   . Skin cancer   . Sphincter of Oddi dysfunction     Past Surgical History:  Procedure Laterality Date  .   ABDOMINAL HYSTERECTOMY    . BILE DUCT STENT PLACEMENT    . CHOLECYSTECTOMY    . LUMBAR FUSION    . MOHS SURGERY     x 11  . PARTIAL NEPHRECTOMY      Social History   Socioeconomic History  . Marital status: Married    Spouse name: Not on file  . Number of children: Not on file  . Years of education: Not on file  . Highest education level: Not on file  Occupational History  . Not on file  Social Needs  . Financial resource strain: Not on file  . Food insecurity    Worry: Not on file    Inability: Not on file  . Transportation needs    Medical: Not on file    Non-medical: Not on file  Tobacco Use  . Smoking status: Former Smoker    Packs/day: 1.00    Years: 40.00    Pack years: 40.00     Types: Cigarettes    Quit date: 12/21/2010    Years since quitting: 8.6  . Smokeless tobacco: Never Used  Substance and Sexual Activity  . Alcohol use: Yes    Alcohol/week: 1.0 - 2.0 standard drinks    Types: 1 - 2 Standard drinks or equivalent per week  . Drug use: No  . Sexual activity: Yes    Birth control/protection: Surgical, None  Lifestyle  . Physical activity    Days per week: Not on file    Minutes per session: Not on file  . Stress: Not on file  Relationships  . Social connections    Talks on phone: Not on file    Gets together: Not on file    Attends religious service: Not on file    Active member of club or organization: Not on file    Attends meetings of clubs or organizations: Not on file    Relationship status: Not on file  . Intimate partner violence    Fear of current or ex partner: Not on file    Emotionally abused: Not on file    Physically abused: Not on file    Forced sexual activity: Not on file  Other Topics Concern  . Not on file  Social History Narrative  . Not on file    Family History  Problem Relation Age of Onset  . Depression Mother   . Hyperlipidemia Mother   . Hypertension Mother   . Heart attack Father   . Hyperlipidemia Father   . Hypertension Father   . Alcohol abuse Sister   . Depression Sister   . Alcohol abuse Son   . Alcohol abuse Son   . Alcohol abuse Sister   . Depression Sister     ROS: no fevers or chills, productive cough, hemoptysis, dysphasia, odynophagia, melena, hematochezia, dysuria, hematuria, rash, seizure activity, orthopnea, PND, pedal edema, claudication. Remaining systems are negative.  Physical Exam: Well-developed obese in no acute distress.  Skin is warm and dry.  HEENT is normal.  Neck is supple.  Chest is clear to auscultation with normal expansion.  Cardiovascular exam is regular rate and rhythm.  Abdominal exam nontender or distended. No masses palpated. Extremities show no edema. neuro grossly  intact  A/P  1 Abnormal nuclear study-personally reviewed; appears to show ischemia in the distal anterior wall, inferior wall and apex. Will plan cath; risks and benefits including MI, CVA and death discussed and she agrees to proceed.  2 Dyspnea-likely multifactoral including OHS, OSA   and deconditioning. Cannot R/o contribution from ischemia; plan cath as outlined above.  3 Palpitations-LV function normal; beta blocker in the future if needed.  4 hypertension-BP controlled; continue present meds and follow.  5 Hyperlipidemia-continue statin.  6 H/O aortic atherosclerosis-continue ASA and statin.  Cher Egnor, MD    

## 2019-08-21 ENCOUNTER — Ambulatory Visit: Payer: BC Managed Care – PPO | Admitting: Cardiology

## 2019-08-21 ENCOUNTER — Other Ambulatory Visit: Payer: Self-pay

## 2019-08-21 ENCOUNTER — Encounter: Payer: Self-pay | Admitting: Cardiology

## 2019-08-21 VITALS — BP 122/84 | HR 88 | Temp 97.9°F | Ht 69.0 in | Wt 287.0 lb

## 2019-08-21 DIAGNOSIS — R0602 Shortness of breath: Secondary | ICD-10-CM | POA: Diagnosis not present

## 2019-08-21 DIAGNOSIS — E7800 Pure hypercholesterolemia, unspecified: Secondary | ICD-10-CM

## 2019-08-21 DIAGNOSIS — I10 Essential (primary) hypertension: Secondary | ICD-10-CM | POA: Diagnosis not present

## 2019-08-21 DIAGNOSIS — R9439 Abnormal result of other cardiovascular function study: Secondary | ICD-10-CM

## 2019-08-21 NOTE — Patient Instructions (Addendum)
    [address] [place] [address] Dept: [phone] Loc: [place]  08/21/2019  You are scheduled for a Cardiac Catheterization on Tuesday, September 15 with Dr. Larae Grooms.  1. Please arrive at the [facility] (Main Entrance A) at [facility]: [address] [place], [place] [address] at 7:00 AM (This time is two hours before your procedure to ensure your preparation). Free valet parking service is available.   Special note: Every effort is made to have your procedure done on time. Please understand that emergencies sometimes delay scheduled procedures.  2. Diet: Do not eat solid foods after midnight.  The patient may have clear liquids until 5am upon the day of the procedure.  3. Labs: You will need to have blood drawn today  GO TO [facility] [place] Friday 09-01-2019 @ 10:20 AM  4. Medication instructions in preparation for your procedure:  On the morning of your procedure, take your Aspirin and any morning medicines.  You may use sips of water.  5. Plan for one night stay--bring personal belongings. 6. Bring a current list of your medications and current insurance cards. 7. You MUST have a responsible person to drive you home. 8. Someone MUST be with you the first 24 hours after you arrive home or your discharge will be delayed. 9. Please wear clothes that are easy to get on and off and wear slip-on shoes.  Thank you for allowing [place] to care for you!   --  Invasive Cardiovascular services

## 2019-08-22 ENCOUNTER — Telehealth: Payer: Self-pay | Admitting: *Deleted

## 2019-08-22 ENCOUNTER — Other Ambulatory Visit: Payer: Self-pay | Admitting: Physician Assistant

## 2019-08-22 DIAGNOSIS — E1165 Type 2 diabetes mellitus with hyperglycemia: Secondary | ICD-10-CM

## 2019-08-22 LAB — BASIC METABOLIC PANEL
BUN/Creatinine Ratio: 16 (ref 12–28)
BUN: 22 mg/dL (ref 8–27)
CO2: 24 mmol/L (ref 20–29)
Calcium: 10 mg/dL (ref 8.7–10.3)
Chloride: 103 mmol/L (ref 96–106)
Creatinine, Ser: 1.4 mg/dL — ABNORMAL HIGH (ref 0.57–1.00)
GFR calc Af Amer: 45 mL/min/{1.73_m2} — ABNORMAL LOW (ref >59)
GFR calc non Af Amer: 39 mL/min/{1.73_m2} — ABNORMAL LOW (ref >59)
Glucose: 81 mg/dL (ref 65–99)
Potassium: 5 mmol/L (ref 3.5–5.2)
Sodium: 143 mmol/L (ref 134–144)

## 2019-08-22 LAB — CBC
Hematocrit: 37.4 % (ref 34.0–46.6)
Hemoglobin: 12.6 g/dL (ref 11.1–15.9)
MCH: 30.3 pg (ref 26.6–33.0)
MCHC: 33.7 g/dL (ref 31.5–35.7)
MCV: 90 fL (ref 79–97)
Platelets: 345 10*3/uL (ref 150–450)
RBC: 4.16 x10E6/uL (ref 3.77–5.28)
RDW: 13.1 % (ref 11.7–15.4)
WBC: 6.5 10*3/uL (ref 3.4–10.8)

## 2019-08-22 NOTE — Telephone Encounter (Addendum)
Spoke with pt, she voiced understanding of recommendations.  ----- Message from Lelon Perla, MD sent at 08/22/2019  7:18 AM EDT ----- Hold triamterene hydrochlorothiazide 2 days prior to and the day of catheterization.  Increase p.o. fluid intake. Bonnie [name]

## 2019-08-24 ENCOUNTER — Encounter: Payer: Self-pay | Admitting: Physician Assistant

## 2019-08-24 ENCOUNTER — Other Ambulatory Visit: Payer: Self-pay

## 2019-08-24 ENCOUNTER — Ambulatory Visit: Payer: BC Managed Care – PPO | Admitting: Physician Assistant

## 2019-08-24 VITALS — BP 124/84 | HR 92 | Temp 98.0°F | Wt 290.0 lb

## 2019-08-24 DIAGNOSIS — R7989 Other specified abnormal findings of blood chemistry: Secondary | ICD-10-CM

## 2019-08-24 DIAGNOSIS — N179 Acute kidney failure, unspecified: Secondary | ICD-10-CM | POA: Diagnosis not present

## 2019-08-24 DIAGNOSIS — F411 Generalized anxiety disorder: Secondary | ICD-10-CM | POA: Diagnosis not present

## 2019-08-24 DIAGNOSIS — M159 Polyosteoarthritis, unspecified: Secondary | ICD-10-CM

## 2019-08-24 DIAGNOSIS — F5105 Insomnia due to other mental disorder: Secondary | ICD-10-CM

## 2019-08-24 DIAGNOSIS — M150 Primary generalized (osteo)arthritis: Secondary | ICD-10-CM

## 2019-08-24 DIAGNOSIS — N189 Chronic kidney disease, unspecified: Secondary | ICD-10-CM

## 2019-08-24 DIAGNOSIS — F419 Anxiety disorder, unspecified: Secondary | ICD-10-CM

## 2019-08-24 DIAGNOSIS — F331 Major depressive disorder, recurrent, moderate: Secondary | ICD-10-CM

## 2019-08-24 MED ORDER — ALPRAZOLAM 0.25 MG PO TABS: 0.2500 mg | ORAL_TABLET | Freq: Every evening | ORAL | 0 refills | Status: DC | PRN

## 2019-08-24 MED ORDER — VENLAFAXINE HCL ER 75 MG PO CP24: 150.0000 mg | ORAL_CAPSULE | Freq: Every day | ORAL | 1 refills | Status: DC

## 2019-08-24 MED ORDER — BUPROPION HCL ER (SR) 150 MG PO TB12: 150.0000 mg | ORAL_TABLET | ORAL | 1 refills | Status: DC

## 2019-08-24 MED ORDER — ACETAMINOPHEN ER 650 MG PO TBCR: 650.0000 mg | EXTENDED_RELEASE_TABLET | Freq: Three times a day (TID) | ORAL | 3 refills | Status: DC

## 2019-08-24 NOTE — Progress Notes (Signed)
HPI:                                                                Bonnie [name] is a 68 y.o. female who presents to [facility] today for follow-up  Palpitations/Irregular heart beats/chronic dyspnea: presented in July with a "flutter" and "flip flopping" sensation followed by a "pause and a beat that was bigger." Echo showed mild diastolic dysfunction. She was referred to Cardiology and underwent myocardial perfusion study which showed a large defect in inferior wall, anterior wall and apex consistent with ischemia. She has a left heart cath scheduled for 09/05/19 With Dr. Stanford Breed.  She has had a increase in her creatinine from baseline and admits she has been taking Celebrex 200 mg twice a day for the last couple of weeks due to increased OA pain. She has noticed that urine is slightly darker and she is not urinating as much (2-3 times per day). She states she is trying to drink more water, but it makes her nauseous.  Sleep problems: she states she is having trouble falling asleep and staying asleep, which has worsened since discontinuing Trazodone 2 months ago. She admits she has racing thoughts and trouble relaxing for sleep.  Depression screen [facility] 2/9 01/17/2019 10/06/2018 12/30/2017  Decreased Interest 0 0 0  Down, Depressed, Hopeless 0 0 0  PHQ - 2 Score 0 0 0  Altered sleeping '1 1 1  ' Tired, decreased energy 0 0 1  Change in appetite '1 1 1  ' Feeling bad or failure about yourself  0 0 0  Trouble concentrating 0 0 0  Moving slowly or fidgety/restless 0 0 0  Suicidal thoughts 0 0 0  PHQ-9 Score '2 2 3  ' Difficult doing work/chores Not difficult at all - Not difficult at all    GAD 7 : Generalized Anxiety Score 01/17/2019 10/06/2018  Nervous, Anxious, on Edge 0 0  Control/stop worrying 0 1  Worry too much - different things 0 0  Trouble relaxing 0 0  Restless 0 0  Easily annoyed or irritable 0 0  Afraid - awful might happen 0 0   Total GAD 7 Score 0 1      Past Medical History:  Diagnosis Date  . Abnormal mammogram of right breast 01/20/2018  . Benign cyst of right breast 01/26/2018  . Centrilobular emphysema ([place]) 07/25/2018  . Chronic kidney disease, stage 3a ([place]) 12/31/2017  . Chronic low back pain   . Colon polyps   . Depression   . Fatty liver   . Hypertension   . Melanoma in situ of right upper extremity ([place])   . Mild diastolic dysfunction 7/51/0258  . Osteoarthritis of lumbar spine   . Renal cancer ([place])   . Skin cancer   . Sphincter of Oddi dysfunction    Past Surgical History:  Procedure Laterality Date  . ABDOMINAL HYSTERECTOMY    . BILE DUCT STENT PLACEMENT    . CHOLECYSTECTOMY    . LUMBAR FUSION    . MOHS SURGERY     x 11  . PARTIAL NEPHRECTOMY     Social History   Tobacco Use  . Smoking status: Former Smoker    Packs/day: 1.00    Years: 40.00  Pack years: 40.00    Types: Cigarettes    Quit date: 12/21/2010    Years since quitting: 8.6  . Smokeless tobacco: Never Used  Substance Use Topics  . Alcohol use: Yes    Alcohol/week: 1.0 - 2.0 standard drinks    Types: 1 - 2 Standard drinks or equivalent per week   family history includes Alcohol abuse in her sister, sister, son, and son; Depression in her mother, sister, and sister; Heart attack in her father; Hyperlipidemia in her father and mother; Hypertension in her father and mother.    ROS: negative except as noted in the HPI  Medications: Current Outpatient Medications  Medication Sig Dispense Refill  . ALPRAZolam (XANAX) 0.25 MG tablet Take 1-2 tablets (0.25-0.5 mg total) by mouth at bedtime as needed for anxiety or sleep. DO NOT COMBINE WITH HYDROCODONE/NORCO 30 tablet 0  . aspirin 81 MG tablet Take by mouth.    Marland Kitchen atorvastatin (LIPITOR) 40 MG tablet Take 1 tablet (40 mg total) by mouth daily. 90 tablet 3  . blood glucose meter kit and supplies KIT Check morning fasting blood glucose and up to four times daily as  directed. 1 each 0  . buPROPion (WELLBUTRIN SR) 150 MG 12 hr tablet Take 1 tablet (150 mg total) by mouth every morning. 90 tablet 1  . Cholecalciferol (VITAMIN D3) 2000 units TABS Take 1 tablet by mouth daily.    . enalapril (VASOTEC) 10 MG tablet TAKE 1 TABLET DAILY 90 tablet 4  . fluticasone (FLONASE) 50 MCG/ACT nasal spray Place into the nose.    Marland Kitchen HYDROcodone-acetaminophen (NORCO/VICODIN) 5-325 MG tablet Take 1 tablet by mouth every 6 (six) hours as needed for moderate pain or severe pain. 60 tablet 0  . Multiple Vitamin tablet Take 1 tablet by mouth.    . Omega-3 Fatty Acids (FISH OIL) 1000 MG CAPS Take by mouth.    . ONE TOUCH ULTRA TEST test strip     . ONETOUCH DELICA LANCETS 80S MISC     . OZEMPIC, 1 MG/DOSE, 2 MG/1.5ML SOPN INJECT 1 MG UNDER THE SKIN ONCE A WEEK 9 mL 3  . senna-docusate (SENOKOT-S) 8.6-50 MG tablet Take 2 tablets by mouth 2 (two) times daily. When taking hydrocodone 60 tablet 5  . triamterene-hydrochlorothiazide (MAXZIDE-25) 37.5-25 MG tablet TAKE 1 TABLET DAILY 90 tablet 3  . venlafaxine XR (EFFEXOR-XR) 75 MG 24 hr capsule Take 2 capsules (150 mg total) by mouth daily with breakfast. 180 capsule 1  . acetaminophen (TYLENOL) 650 MG CR tablet Take 1 tablet (650 mg total) by mouth every 8 (eight) hours. MAX 4G/DAY 90 tablet 3   No current facility-administered medications for this visit.    Allergies  Allergen Reactions  . Latex Itching  . Metformin And Related Other (See Comments)    Malaise, fatigue  . Morphine And Related Rash  . Tape Swelling and Other (See Comments)    Burning sensation  . Tramadol Palpitations    Heart fluttering       Objective:  BP 124/84   Pulse 92   Temp 98 F (36.7 C) (Oral)   Wt 290 lb (131.5 kg)   BMI 42.83 kg/m   Wt Readings from Last 3 Encounters:  08/24/19 290 lb (131.5 kg)  08/21/19 287 lb (130.2 kg)  08/10/19 288 lb (130.6 kg)   Temp Readings from Last 3 Encounters:  08/24/19 98 F (36.7 C) (Oral)  08/21/19  97.9 F (36.6 C)  06/29/19 98.1 F (36.7 C) (  Oral)   BP Readings from Last 3 Encounters:  08/24/19 124/84  08/21/19 122/84  07/19/19 132/78   Pulse Readings from Last 3 Encounters:  08/24/19 92  08/21/19 88  07/19/19 (!) 107    Gen:  alert, not ill-appearing, no distress, appropriate for age 51: head normocephalic without obvious abnormality, conjunctiva and cornea clear, trachea midline Pulm: Normal work of breathing, normal phonation, clear to auscultation bilaterally, no wheezes, rales or rhonchi CV: Normal rate, regular rhythm, s1 and s2 distinct, no murmurs, clicks or rubs  Neuro: alert and oriented x 3, no tremor MSK: extremities atraumatic, normal gait and station Skin: intact, no rashes on exposed skin, no jaundice, no cyanosis Psych: well-groomed, cooperative, good eye contact, euthymic mood, affect mood-congruent, speech is articulate, and thought processes clear and goal-directed    Results for orders placed or performed in visit on 08/21/19 (from the past 72 hour(s))  Basic metabolic panel     Status: Abnormal   Collection Time: 08/21/19 12:16 PM  Result Value Ref Range   Glucose 81 65 - 99 mg/dL   BUN 22 8 - 27 mg/dL   Creatinine, Ser 1.40 (H) 0.57 - 1.00 mg/dL   GFR calc non Af Amer 39 (L) >59 mL/min/1.73   GFR calc Af Amer 45 (L) >59 mL/min/1.73   BUN/Creatinine Ratio 16 12 - 28   Sodium 143 134 - 144 mmol/L   Potassium 5.0 3.5 - 5.2 mmol/L   Chloride 103 96 - 106 mmol/L   CO2 24 20 - 29 mmol/L   Calcium 10.0 8.7 - 10.3 mg/dL  CBC     Status: None   Collection Time: 08/21/19 12:16 PM  Result Value Ref Range   WBC 6.5 3.4 - 10.8 x10E3/uL   RBC 4.16 3.77 - 5.28 x10E6/uL   Hemoglobin 12.6 11.1 - 15.9 g/dL   Hematocrit 37.4 34.0 - 46.6 %   MCV 90 79 - 97 fL   MCH 30.3 26.6 - 33.0 pg   MCHC 33.7 31.5 - 35.7 g/dL   RDW 13.1 11.7 - 15.4 %   Platelets 345 150 - 450 x10E3/uL   No results found.    Assessment and Plan: 68 y.o. female with    .Jenevie was seen today for medication management.  Diagnoses and all orders for this visit:  Acute kidney injury superimposed on CKD ([place]) -     BASIC METABOLIC PANEL WITH GFR -     Urinalysis, Routine w reflex microscopic -     Urine Microalbumin w/creat. ratio  Anxiety state  Increase in serum creatinine from prior measurement -     BASIC METABOLIC PANEL WITH GFR -     Urinalysis, Routine w reflex microscopic -     Urine Microalbumin w/creat. ratio  Insomnia secondary to anxiety -     ALPRAZolam (XANAX) 0.25 MG tablet; Take 1-2 tablets (0.25-0.5 mg total) by mouth at bedtime as needed for anxiety or sleep. DO NOT COMBINE WITH HYDROCODONE/NORCO  Primary osteoarthritis involving multiple joints -     acetaminophen (TYLENOL) 650 MG CR tablet; Take 1 tablet (650 mg total) by mouth every 8 (eight) hours. MAX 4G/DAY (Patient taking differently: Take 650 mg by mouth every 8 (eight) hours as needed for pain. MAX 4G/DAY)  Moderate episode of recurrent major depressive disorder (HCC) -     venlafaxine XR (EFFEXOR-XR) 75 MG 24 hr capsule; Take 2 capsules (150 mg total) by mouth daily with breakfast. (Patient taking differently: Take 75 mg by mouth  daily with breakfast. ) -     buPROPion (WELLBUTRIN SR) 150 MG 12 hr tablet; Take 1 tablet (150 mg total) by mouth every morning.  Recurrent major depressive disorder, in full remission ([place])     AKI superimposed on CKD Stop Celebrex and avoid all NSAIDS Schedule Tylenol arthritis 650 every 8 hours  Can take an additional dose every 8 hours as needed (max 4000 mg/day in combination with Norco)  Recheck kidney function in 4-5 days  Anxiety with depression, Insomnia 2/2 anxiety Reducing Wellbutrin SR from 200 mg to 150 mg Cont Effexor 150 mg QAM She is having multiple stressors that are exacerbating her anxiety and sleep problems, including health issues and nervousness about upcoming cardiac cath Temporarily take Alprazolam 0.25-0.5 mg  QHS x 5-7 nights for insomnia/anxiety, then reduce to prn  Patient education and anticipatory guidance given Patient agrees with treatment plan Follow-up in 6 weeks with Dr. Loni Muse or sooner as needed if symptoms worsen or fail to improve  Darlyne Russian PA-C

## 2019-08-24 NOTE — Patient Instructions (Addendum)
Check kidney function on Tuesday or Wednesday next week Stop Celebrex No NSAIDs! Schedule Tylenol arthritis 650 every 8 hours  Can take an additional dose every 8 hours as needed (max 4000 mg/day in combination with Norco)

## 2019-08-30 ENCOUNTER — Encounter: Payer: Self-pay | Admitting: Physician Assistant

## 2019-08-30 DIAGNOSIS — R7989 Other specified abnormal findings of blood chemistry: Secondary | ICD-10-CM | POA: Diagnosis not present

## 2019-08-30 DIAGNOSIS — N189 Chronic kidney disease, unspecified: Secondary | ICD-10-CM | POA: Diagnosis not present

## 2019-08-30 DIAGNOSIS — N179 Acute kidney failure, unspecified: Secondary | ICD-10-CM | POA: Diagnosis not present

## 2019-08-31 ENCOUNTER — Telehealth: Payer: Self-pay

## 2019-08-31 LAB — URINALYSIS, ROUTINE W REFLEX MICROSCOPIC
Bilirubin Urine: NEGATIVE
Glucose, UA: NEGATIVE
Hgb urine dipstick: NEGATIVE
Ketones, ur: NEGATIVE
Leukocytes,Ua: NEGATIVE
Nitrite: NEGATIVE
Protein, ur: NEGATIVE
Specific Gravity, Urine: 1.015 (ref 1.001–1.03)
pH: 7 (ref 5.0–8.0)

## 2019-08-31 LAB — BASIC METABOLIC PANEL WITH GFR
BUN/Creatinine Ratio: 13 (calc) (ref 6–22)
BUN: 17 mg/dL (ref 7–25)
CO2: 27 mmol/L (ref 20–32)
Calcium: 9.9 mg/dL (ref 8.6–10.4)
Chloride: 104 mmol/L (ref 98–110)
Creat: 1.33 mg/dL — ABNORMAL HIGH (ref 0.50–0.99)
GFR, Est African American: 47 mL/min/{1.73_m2} — ABNORMAL LOW (ref >=60)
GFR, Est Non African American: 41 mL/min/{1.73_m2} — ABNORMAL LOW (ref >=60)
Glucose, Bld: 102 mg/dL — ABNORMAL HIGH (ref 65–99)
Potassium: 4.1 mmol/L (ref 3.5–5.3)
Sodium: 141 mmol/L (ref 135–146)

## 2019-08-31 LAB — MICROALBUMIN / CREATININE URINE RATIO
Creatinine, Urine: 110 mg/dL (ref 20–275)
Microalb Creat Ratio: 14 mcg/mg creat (ref <30)
Microalb, Ur: 1.5 mg/dL

## 2019-08-31 NOTE — Telephone Encounter (Signed)
Bonnie [name] called and left a message stating she is going to have a left heart cath and coronary angiography. She is worried because she is not feeling well and is worried about her kidney functions. She would like [name] advise.

## 2019-09-01 ENCOUNTER — Other Ambulatory Visit (HOSPITAL_COMMUNITY)
Admission: RE | Admit: 2019-09-01 | Discharge: 2019-09-01 | Disposition: A | Discharge status: Home / Self Care | Payer: BC Managed Care – PPO | Source: Ambulatory Visit | Attending: Interventional Cardiology | Admitting: Interventional Cardiology

## 2019-09-01 ENCOUNTER — Other Ambulatory Visit: Payer: Self-pay

## 2019-09-01 DIAGNOSIS — Z20828 Contact with and (suspected) exposure to other viral communicable diseases: Secondary | ICD-10-CM | POA: Diagnosis not present

## 2019-09-01 DIAGNOSIS — Z01812 Encounter for preprocedural laboratory examination: Secondary | ICD-10-CM | POA: Diagnosis not present

## 2019-09-02 LAB — NOVEL CORONAVIRUS, NAA (HOSP ORDER, SEND-OUT TO REF LAB; TAT 18-24 HRS): SARS-CoV-2, NAA: NOT DETECTED

## 2019-09-04 ENCOUNTER — Telehealth: Payer: Self-pay | Admitting: *Deleted

## 2019-09-04 DIAGNOSIS — R9439 Abnormal result of other cardiovascular function study: Secondary | ICD-10-CM | POA: Diagnosis present

## 2019-09-04 NOTE — Telephone Encounter (Signed)
Pt contacted pre-catheterization scheduled at [facility] for: Left heart catheterization on 09/05/2019. Verified arrival time and place: [facility]) at 6:00 am for pre-procedure IV hydration before her 12:00 pm catheterization.    No solid food after midnight prior to cath, clear liquids until 5 AM day of procedure. Contrast allergy: None  Patient confirms that she is taking ozempic for diabetes and usually takes this on Sunday but did not due to nausea. Patient will speak with her PCP today regarding when to resume this medication.   Patient has not take triamterene-hydrochlorothiazide today and will not take it tomorrow. Patient advised to hold enalapril tomorrow as she has already taken this medication today.   AM meds can be  taken pre-cath with sip of water including: ASA 81 mg   Confirmed patient has responsible person to drive home post procedure and observe 24 hours after arriving home: yes  Currently, due to Covid-19 pandemic, only one support person will be allowed with patient. Must be the same support person for that patient's entire stay, will be screened and required to wear a mask. They will be asked to wait in the waiting room for the duration of the patient's stay.  Patients are required to wear a mask when they enter the hospital.       COVID-19 Pre-Screening Questions:  . In the past 7 to 10 days have you had a cough,  shortness of breath, headache, congestion, fever (100 or greater) body aches, chills, sore throat, or sudden loss of taste or sense of smell? No . Have you been around anyone with known Covid 19? No . Have you been around anyone who is awaiting Covid 19 test results in the past 7 to 10 days? No . Have you been around anyone who has been exposed to Covid 19, or has mentioned symptoms of Covid 19 within the past 7 to 10 days? No

## 2019-09-04 NOTE — Telephone Encounter (Signed)
Pt left VM on triage line stating she has additional concerns. Attempted callback - no answer. Left VM for Pt to return clinic call.  Did speak with PCP and was advised if Pt still having symptoms she needs a visit for eval

## 2019-09-04 NOTE — Telephone Encounter (Signed)
I have been communicating with patient about her nausea via MyChart We have a plan in place for her to reduce her Ozempic to 0.5 mg beginning 09/06/19 and follow-up prn

## 2019-09-05 ENCOUNTER — Ambulatory Visit (HOSPITAL_COMMUNITY)
Admission: RE | Admit: 2019-09-05 | Discharge: 2019-09-05 | Disposition: A | Discharge status: Home / Self Care | Payer: BC Managed Care – PPO | Attending: Cardiology | Admitting: Cardiology

## 2019-09-05 ENCOUNTER — Encounter (HOSPITAL_COMMUNITY)
Admission: RE | Disposition: A | Discharge status: Home / Self Care | Payer: BC Managed Care – PPO | Source: Home / Self Care | Attending: Cardiology

## 2019-09-05 ENCOUNTER — Other Ambulatory Visit: Payer: Self-pay

## 2019-09-05 DIAGNOSIS — K760 Fatty (change of) liver, not elsewhere classified: Secondary | ICD-10-CM | POA: Insufficient documentation

## 2019-09-05 DIAGNOSIS — Z85528 Personal history of other malignant neoplasm of kidney: Secondary | ICD-10-CM | POA: Insufficient documentation

## 2019-09-05 DIAGNOSIS — I2510 Atherosclerotic heart disease of native coronary artery without angina pectoris: Secondary | ICD-10-CM | POA: Diagnosis present

## 2019-09-05 DIAGNOSIS — I129 Hypertensive chronic kidney disease with stage 1 through stage 4 chronic kidney disease, or unspecified chronic kidney disease: Secondary | ICD-10-CM | POA: Diagnosis not present

## 2019-09-05 DIAGNOSIS — Z79899 Other long term (current) drug therapy: Secondary | ICD-10-CM | POA: Insufficient documentation

## 2019-09-05 DIAGNOSIS — Z87891 Personal history of nicotine dependence: Secondary | ICD-10-CM | POA: Diagnosis not present

## 2019-09-05 DIAGNOSIS — Z791 Long term (current) use of non-steroidal anti-inflammatories (NSAID): Secondary | ICD-10-CM | POA: Diagnosis not present

## 2019-09-05 DIAGNOSIS — Z905 Acquired absence of kidney: Secondary | ICD-10-CM | POA: Diagnosis not present

## 2019-09-05 DIAGNOSIS — E785 Hyperlipidemia, unspecified: Secondary | ICD-10-CM | POA: Diagnosis not present

## 2019-09-05 DIAGNOSIS — G4733 Obstructive sleep apnea (adult) (pediatric): Secondary | ICD-10-CM | POA: Diagnosis not present

## 2019-09-05 DIAGNOSIS — R9439 Abnormal result of other cardiovascular function study: Secondary | ICD-10-CM | POA: Diagnosis not present

## 2019-09-05 DIAGNOSIS — N183 Chronic kidney disease, stage 3 (moderate): Secondary | ICD-10-CM | POA: Insufficient documentation

## 2019-09-05 DIAGNOSIS — Z8249 Family history of ischemic heart disease and other diseases of the circulatory system: Secondary | ICD-10-CM | POA: Diagnosis not present

## 2019-09-05 DIAGNOSIS — R0609 Other forms of dyspnea: Secondary | ICD-10-CM | POA: Diagnosis present

## 2019-09-05 DIAGNOSIS — R002 Palpitations: Secondary | ICD-10-CM | POA: Insufficient documentation

## 2019-09-05 DIAGNOSIS — Z7982 Long term (current) use of aspirin: Secondary | ICD-10-CM | POA: Insufficient documentation

## 2019-09-05 DIAGNOSIS — I700 Atherosclerosis of aorta: Secondary | ICD-10-CM | POA: Diagnosis not present

## 2019-09-05 HISTORY — PX: LEFT HEART CATH AND CORONARY ANGIOGRAPHY: CATH118249

## 2019-09-05 SURGERY — LEFT HEART CATH AND CORONARY ANGIOGRAPHY
Anesthesia: LOCAL

## 2019-09-05 MED ORDER — VERAPAMIL HCL 2.5 MG/ML IV SOLN
INTRAVENOUS | Status: AC
  Filled 2019-09-05: qty 2

## 2019-09-05 MED ORDER — SODIUM CHLORIDE 0.9 % IV SOLN: INTRAVENOUS | Status: AC

## 2019-09-05 MED ORDER — LIDOCAINE HCL (PF) 1 % IJ SOLN
INTRAMUSCULAR | Status: DC | PRN
  Administered 2019-09-05: 2 mL

## 2019-09-05 MED ORDER — SODIUM CHLORIDE 0.9 % WEIGHT BASED INFUSION
3.0000 mL/kg/h | INTRAVENOUS | Status: AC
  Administered 2019-09-05: 3 mL/kg/h via INTRAVENOUS

## 2019-09-05 MED ORDER — SODIUM CHLORIDE 0.9 % WEIGHT BASED INFUSION
1.0000 mL/kg/h | INTRAVENOUS | Status: DC
  Administered 2019-09-05 (×2): 1 mL/kg/h via INTRAVENOUS

## 2019-09-05 MED ORDER — ONDANSETRON HCL 4 MG/2ML IJ SOLN: 4.0000 mg | Freq: Four times a day (QID) | INTRAMUSCULAR | Status: DC | PRN

## 2019-09-05 MED ORDER — HEPARIN (PORCINE) IN NACL 1000-0.9 UT/500ML-% IV SOLN
INTRAVENOUS | Status: DC | PRN
  Administered 2019-09-05 (×2): 500 mL

## 2019-09-05 MED ORDER — HEPARIN SODIUM (PORCINE) 1000 UNIT/ML IJ SOLN
INTRAMUSCULAR | Status: DC | PRN
  Administered 2019-09-05: 6000 [IU] via INTRAVENOUS

## 2019-09-05 MED ORDER — ASPIRIN 81 MG PO CHEW: 81.0000 mg | CHEWABLE_TABLET | ORAL | Status: DC

## 2019-09-05 MED ORDER — MIDAZOLAM HCL 2 MG/2ML IJ SOLN
INTRAMUSCULAR | Status: DC | PRN
  Administered 2019-09-05: 2 mg via INTRAVENOUS

## 2019-09-05 MED ORDER — MIDAZOLAM HCL 2 MG/2ML IJ SOLN
INTRAMUSCULAR | Status: AC
  Filled 2019-09-05: qty 2

## 2019-09-05 MED ORDER — FENTANYL CITRATE (PF) 100 MCG/2ML IJ SOLN
INTRAMUSCULAR | Status: DC | PRN
  Administered 2019-09-05 (×2): 25 ug via INTRAVENOUS

## 2019-09-05 MED ORDER — SODIUM CHLORIDE 0.9 % IV SOLN: 250.0000 mL | INTRAVENOUS | Status: DC | PRN

## 2019-09-05 MED ORDER — HYDRALAZINE HCL 20 MG/ML IJ SOLN: 10.0000 mg | INTRAMUSCULAR | Status: DC | PRN

## 2019-09-05 MED ORDER — FENTANYL CITRATE (PF) 100 MCG/2ML IJ SOLN
INTRAMUSCULAR | Status: AC
  Filled 2019-09-05: qty 2

## 2019-09-05 MED ORDER — SODIUM CHLORIDE 0.9% FLUSH: 3.0000 mL | INTRAVENOUS | Status: DC | PRN

## 2019-09-05 MED ORDER — HEPARIN SODIUM (PORCINE) 1000 UNIT/ML IJ SOLN
INTRAMUSCULAR | Status: AC
  Filled 2019-09-05: qty 1

## 2019-09-05 MED ORDER — VERAPAMIL HCL 2.5 MG/ML IV SOLN
INTRAVENOUS | Status: DC | PRN
  Administered 2019-09-05: 13:00:00 10 mL via INTRA_ARTERIAL

## 2019-09-05 MED ORDER — ACETAMINOPHEN 325 MG PO TABS: 650.0000 mg | ORAL_TABLET | ORAL | Status: DC | PRN

## 2019-09-05 MED ORDER — IOHEXOL 350 MG/ML SOLN
INTRAVENOUS | Status: DC | PRN
  Administered 2019-09-05: 60 mL via INTRA_ARTERIAL

## 2019-09-05 MED ORDER — SODIUM CHLORIDE 0.9% FLUSH: 3.0000 mL | Freq: Two times a day (BID) | INTRAVENOUS | Status: DC

## 2019-09-05 MED ORDER — HEPARIN (PORCINE) IN NACL 1000-0.9 UT/500ML-% IV SOLN
INTRAVENOUS | Status: AC
  Filled 2019-09-05: qty 500

## 2019-09-05 MED ORDER — LABETALOL HCL 5 MG/ML IV SOLN: 10.0000 mg | INTRAVENOUS | Status: DC | PRN

## 2019-09-05 SURGICAL SUPPLY — 11 items
CATH OPTITORQUE TIG 4.0 5F (CATHETERS) ×1 IMPLANT
DEVICE RAD COMP TR BAND LRG (VASCULAR PRODUCTS) ×1 IMPLANT
GLIDESHEATH SLEND SS 6F .021 (SHEATH) ×1 IMPLANT
GUIDEWIRE INQWIRE 1.5J.035X260 (WIRE) IMPLANT
INQWIRE 1.5J .035X260CM (WIRE) ×2
KIT HEART LEFT (KITS) ×2 IMPLANT
PACK CARDIAC CATHETERIZATION (CUSTOM PROCEDURE TRAY) ×2 IMPLANT
SHEATH PROBE COVER 6X72 (BAG) ×1 IMPLANT
TRANSDUCER W/STOPCOCK (MISCELLANEOUS) ×2 IMPLANT
TUBING CIL FLEX 10 FLL-RA (TUBING) ×2 IMPLANT
WIRE MICROINTRODUCER 60CM (WIRE) ×1 IMPLANT

## 2019-09-05 NOTE — Discharge Instructions (Signed)
Radial Site Care ° °This sheet gives you information about how to care for yourself after your procedure. Your health care provider may also give you more specific instructions. If you have problems or questions, contact your health care provider. °What can I expect after the procedure? °After the procedure, it is common to have: °· Bruising and tenderness at the catheter insertion area. °Follow these instructions at home: °Medicines °· Take over-the-counter and prescription medicines only as told by your health care provider. °Insertion site care °· Follow instructions from your health care provider about how to take care of your insertion site. Make sure you: °? Wash your hands with soap and water before you change your bandage (dressing). If soap and water are not available, use hand sanitizer. °? Change your dressing as told by your health care provider. °? Leave stitches (sutures), skin glue, or adhesive strips in place. These skin closures may need to stay in place for 2 weeks or longer. If adhesive strip edges start to loosen and curl up, you may trim the loose edges. Do not remove adhesive strips completely unless your health care provider tells you to do that. °· Check your insertion site every day for signs of infection. Check for: °? Redness, swelling, or pain. °? Fluid or blood. °? Pus or a bad smell. °? Warmth. °· Do not take baths, swim, or use a hot tub until your health care provider approves. °· You may shower 24-48 hours after the procedure, or as directed by your health care provider. °? Remove the dressing and gently wash the site with plain soap and water. °? Pat the area dry with a clean towel. °? Do not rub the site. That could cause bleeding. °· Do not apply powder or lotion to the site. °Activity ° °· For 24 hours after the procedure, or as directed by your health care provider: °? Do not flex or bend the affected arm. °? Do not push or pull heavy objects with the affected arm. °? Do not  drive yourself home from the hospital or clinic. You may drive 24 hours after the procedure unless your health care provider tells you not to. °? Do not operate machinery or power tools. °· Do not lift anything that is heavier than 10 lb (4.5 kg), or the limit that you are told, until your health care provider says that it is safe. °· Ask your health care provider when it is okay to: °? Return to work or school. °? Resume usual physical activities or sports. °? Resume sexual activity. °General instructions °· If the catheter site starts to bleed, raise your arm and put firm pressure on the site. If the bleeding does not stop, get help right away. This is a medical emergency. °· If you went home on the same day as your procedure, a responsible adult should be with you for the first 24 hours after you arrive home. °· Keep all follow-up visits as told by your health care provider. This is important. °Contact a health care provider if: °· You have a fever. °· You have redness, swelling, or yellow drainage around your insertion site. °Get help right away if: °· You have unusual pain at the radial site. °· The catheter insertion area swells very fast. °· The insertion area is bleeding, and the bleeding does not stop when you hold steady pressure on the area. °· Your arm or hand becomes pale, cool, tingly, or numb. °These symptoms may represent a serious problem   that is an emergency. Do not wait to see if the symptoms will go away. Get medical help right away. Call your local emergency services (911 in the [place] Do not drive yourself to the hospital. °Summary °· After the procedure, it is common to have bruising and tenderness at the site. °· Follow instructions from your health care provider about how to take care of your radial site wound. Check the wound every day for signs of infection. °· Do not lift anything that is heavier than 10 lb (4.5 kg), or the limit that you are told, until your health care provider says  that it is safe. °This information is not intended to replace advice given to you by your health care provider. Make sure you discuss any questions you have with your health care provider. °Document Released: 01/09/2011 Document Revised: 01/12/2018 Document Reviewed: 01/12/2018 °Elsevier Patient Education © 2020 [employer] ° °

## 2019-09-05 NOTE — Interval H&P Note (Signed)
History and Physical Interval Note:  09/05/2019 12:58 PM  Bonnie [name]  has presented today for surgery, with the diagnosis of abnormal nuclear stress test.  The various methods of treatment have been discussed with the patient and family. After consideration of risks, benefits and other options for treatment, the patient has consented to  Procedure(s): LEFT HEART CATH AND CORONARY ANGIOGRAPHY (N/A)  PERCUTANEOUS CORONARY INTERVENTION   as a surgical intervention.  The patient's history has been reviewed, patient examined, no change in status, stable for surgery.  I have reviewed the patient's chart and labs.  Questions were answered to the patient's satisfaction.    Cath Lab Visit (complete for each Cath Lab visit)  Clinical Evaluation Leading to the Procedure:   ACS: No.  Non-ACS:    Anginal Classification: CCS II  Anti-ischemic medical therapy: No Therapy  Non-Invasive Test Results: Intermediate-risk stress test findings: cardiac mortality 1-3%/year  Prior CABG: No previous CABG    Glenetta Hew

## 2019-09-06 ENCOUNTER — Encounter (HOSPITAL_COMMUNITY): Payer: Self-pay | Admitting: Cardiology

## 2019-09-10 MED ORDER — RYBELSUS 3 MG PO TABS: 3.0000 mg | ORAL_TABLET | Freq: Every day | ORAL | 5 refills | Status: DC

## 2019-09-11 ENCOUNTER — Other Ambulatory Visit: Payer: Self-pay | Admitting: Physician Assistant

## 2019-09-11 DIAGNOSIS — F3342 Major depressive disorder, recurrent, in full remission: Secondary | ICD-10-CM

## 2019-09-11 NOTE — Telephone Encounter (Signed)
[facility] requesting med refill for trazadone. Rx not listed in active med list. Pls advise if appropriate. Thanks.

## 2019-09-11 NOTE — Telephone Encounter (Signed)
Will need to f/u to make sure this is working well for her.

## 2019-09-12 NOTE — Telephone Encounter (Signed)
Left a detailed vm msg for pt regarding covering provider's note. Direct call back info provided.

## 2019-09-17 DIAGNOSIS — F419 Anxiety disorder, unspecified: Secondary | ICD-10-CM | POA: Insufficient documentation

## 2019-09-17 DIAGNOSIS — N179 Acute kidney failure, unspecified: Secondary | ICD-10-CM | POA: Insufficient documentation

## 2019-09-17 DIAGNOSIS — F5105 Insomnia due to other mental disorder: Secondary | ICD-10-CM | POA: Insufficient documentation

## 2019-09-17 DIAGNOSIS — R7989 Other specified abnormal findings of blood chemistry: Secondary | ICD-10-CM | POA: Insufficient documentation

## 2019-09-25 ENCOUNTER — Other Ambulatory Visit: Payer: Self-pay

## 2019-09-25 ENCOUNTER — Ambulatory Visit: Payer: BC Managed Care – PPO | Admitting: Sports Medicine

## 2019-09-25 ENCOUNTER — Encounter: Payer: Self-pay | Admitting: Sports Medicine

## 2019-09-25 DIAGNOSIS — M170 Bilateral primary osteoarthritis of knee: Secondary | ICD-10-CM | POA: Diagnosis not present

## 2019-09-25 DIAGNOSIS — M1612 Unilateral primary osteoarthritis, left hip: Secondary | ICD-10-CM | POA: Diagnosis not present

## 2019-09-25 NOTE — Assessment & Plan Note (Signed)
With multiple medical and orthopedic comorbidities. Needs sleeve gastrectomy.

## 2019-09-25 NOTE — Progress Notes (Signed)
Subjective:    CC: Left hip pain  HPI: Bonnie [name]  returns, she is a pleasant 68 year old female with severe hip osteoarthritis, previous injection was approximately 6 months ago, now with recurrence of pain, moderate, persistent, localized in the left hip, groin and worse with weightbearing.  Knee arthritis: Orthovisc was a plan exclusion from her previous insurance company, she has switched to [employer].  Obesity: She is agreeable to consider bariatric surgery now.  I reviewed the past medical history, family history, social history, surgical history, and allergies today and no changes were needed.  Please see the problem list section below in epic for further details.  Past Medical History: Past Medical History:  Diagnosis Date  . Abnormal mammogram of right breast 01/20/2018  . Benign cyst of right breast 01/26/2018  . Centrilobular emphysema ([place]) 07/25/2018  . Chronic kidney disease, stage 3a 12/31/2017  . Chronic low back pain   . Colon polyps   . Depression   . Fatty liver   . Hypertension   . Melanoma in situ of right upper extremity ([place])   . Mild diastolic dysfunction 0000000  . Osteoarthritis of lumbar spine   . Renal cancer ([place])   . Skin cancer   . Sphincter of Oddi dysfunction    Past Surgical History: Past Surgical History:  Procedure Laterality Date  . ABDOMINAL HYSTERECTOMY    . BILE DUCT STENT PLACEMENT    . CHOLECYSTECTOMY    . LEFT HEART CATH AND CORONARY ANGIOGRAPHY N/A 09/05/2019   Procedure: LEFT HEART CATH AND CORONARY ANGIOGRAPHY;  Surgeon: Leonie Man, MD;  Location: [facility];  Service: Cardiovascular;  Laterality: N/A;  . LUMBAR FUSION    . MOHS SURGERY     x 11  . PARTIAL NEPHRECTOMY     Social History: Social History   Socioeconomic History  . Marital status: Married    Spouse name: Not on file  . Number of children: Not on file  . Years of education: Not on file  . Highest education level: Not on file  Occupational History   . Not on file  Social Needs  . Financial resource strain: Not on file  . Food insecurity    Worry: Not on file    Inability: Not on file  . Transportation needs    Medical: Not on file    Non-medical: Not on file  Tobacco Use  . Smoking status: Former Smoker    Packs/day: 1.00    Years: 40.00    Pack years: 40.00    Types: Cigarettes    Quit date: 12/21/2010    Years since quitting: 8.7  . Smokeless tobacco: Never Used  Substance and Sexual Activity  . Alcohol use: Yes    Alcohol/week: 1.0 - 2.0 standard drinks    Types: 1 - 2 Standard drinks or equivalent per week  . Drug use: No  . Sexual activity: Yes    Birth control/protection: Surgical, None  Lifestyle  . Physical activity    Days per week: Not on file    Minutes per session: Not on file  . Stress: Not on file  Relationships  . Social Herbalist on phone: Not on file    Gets together: Not on file    Attends religious service: Not on file    Active member of club or organization: Not on file    Attends meetings of clubs or organizations: Not on file    Relationship status: Not  on file  Other Topics Concern  . Not on file  Social History Narrative  . Not on file   Family History: Family History  Problem Relation Age of Onset  . Depression Mother   . Hyperlipidemia Mother   . Hypertension Mother   . Heart attack Father   . Hyperlipidemia Father   . Hypertension Father   . Alcohol abuse Sister   . Depression Sister   . Alcohol abuse Son   . Alcohol abuse Son   . Alcohol abuse Sister   . Depression Sister    Allergies: Allergies  Allergen Reactions  . Latex Itching and Rash  . Metformin And Related Other (See Comments)    Malaise, fatigue  . Morphine And Related Rash and Other (See Comments)    Agitation  . Tape Swelling and Other (See Comments)    Burning sensation  . Tramadol Palpitations    Heart fluttering   Medications: See med rec.  Review of Systems: No fevers, chills, night  sweats, weight loss, chest pain, or shortness of breath.   Objective:    General: Well Developed, well nourished, and in no acute distress.  Neuro: Alert and oriented x3, extra-ocular muscles intact, sensation grossly intact.  HEENT: Normocephalic, atraumatic, pupils equal round reactive to light, neck supple, no masses, no lymphadenopathy, thyroid nonpalpable.  Skin: Warm and dry, no rashes. Cardiac: Regular rate and rhythm, no murmurs rubs or gallops, no lower extremity edema.  Respiratory: Clear to auscultation bilaterally. Not using accessory muscles, speaking in full sentences. Left hip: Reproduction of pain in the groin with internal rotation.  Procedure: Real-time Ultrasound Guided injection of the left hip joint Device: GE Logiq E  Verbal informed consent obtained.  Time-out conducted.  Noted no overlying erythema, induration, or other signs of local infection.  Skin prepped in a sterile fashion.  Local anesthesia: Topical Ethyl chloride.  With sterile technique and under real time ultrasound guidance:  22-gauge spinal needle advanced to the femoral head/neck junction, contacted bone and then injected 1 cc Kenalog 40, 2 cc lidocaine, 2 cc bupivacaine. Completed without difficulty  Pain immediately resolved suggesting accurate placement of the medication.  Advised to call if fevers/chills, erythema, induration, drainage, or persistent bleeding.  Images permanently stored and available for review in the ultrasound unit.  Impression: Technically successful ultrasound guided injection.  Impression and Recommendations:    Primary osteoarthritis of left hip Injection today.   Primary osteoarthritis of both knees Short-lived responses to bilateral knee injections, we are going to try to get her approved for Orthovisc again. Continue Celebrex. Ultimately she is going to need gastric sleeve and needs to lose about 200 pounds.  Morbid obesity ([place]) With multiple medical and  orthopedic comorbidities. Needs sleeve gastrectomy.   ___________________________________________ Gwen Her. Dianah Field, M.D., ABFM., CAQSM. Primary Care and Sports Medicine [facility]  Adjunct Professor of [place] [employer] [facility] [employer] [facility] [employer] [facility]

## 2019-09-25 NOTE — Assessment & Plan Note (Signed)
Injection today 

## 2019-09-25 NOTE — Assessment & Plan Note (Signed)
Short-lived responses to bilateral knee injections, we are going to try to get her approved for Orthovisc again. Continue Celebrex. Ultimately she is going to need gastric sleeve and needs to lose about 200 pounds.

## 2019-10-02 DIAGNOSIS — Z85828 Personal history of other malignant neoplasm of skin: Secondary | ICD-10-CM | POA: Diagnosis not present

## 2019-10-02 DIAGNOSIS — Z85820 Personal history of malignant melanoma of skin: Secondary | ICD-10-CM | POA: Diagnosis not present

## 2019-10-02 DIAGNOSIS — L579 Skin changes due to chronic exposure to nonionizing radiation, unspecified: Secondary | ICD-10-CM | POA: Diagnosis not present

## 2019-10-02 DIAGNOSIS — L570 Actinic keratosis: Secondary | ICD-10-CM | POA: Diagnosis not present

## 2019-10-04 ENCOUNTER — Telehealth: Payer: Self-pay

## 2019-10-04 DIAGNOSIS — M159 Polyosteoarthritis, unspecified: Secondary | ICD-10-CM

## 2019-10-04 DIAGNOSIS — Z79891 Long term (current) use of opiate analgesic: Secondary | ICD-10-CM

## 2019-10-04 MED ORDER — HYDROCODONE-ACETAMINOPHEN 5-325 MG PO TABS: 1.0000 | ORAL_TABLET | Freq: Four times a day (QID) | ORAL | 0 refills | Status: DC | PRN

## 2019-10-04 NOTE — Telephone Encounter (Signed)
sent limited quantity to [facility] on file MUST KEEP APPT or NO further refils will be authorized

## 2019-10-04 NOTE — Telephone Encounter (Signed)
Attempted to reach pt, no answer. Phone keeps ringing then busy tone. Unable to leave a vm msg for pt regarding provider's note.

## 2019-10-04 NOTE — Telephone Encounter (Signed)
Pt called requesting a med refill for hydrocodone - ace. As per pt, she was seen previously by [facility]. Pt will be leaving town on Thursday and states that it has been months since her last refill request. She would like the rx to be sent to [facility] drug store. Pt does have an upcoming appt on 10/10/19 with provider. Pls advise, thanks.

## 2019-10-09 DIAGNOSIS — G4733 Obstructive sleep apnea (adult) (pediatric): Secondary | ICD-10-CM | POA: Diagnosis not present

## 2019-10-09 DIAGNOSIS — Z87891 Personal history of nicotine dependence: Secondary | ICD-10-CM | POA: Diagnosis not present

## 2019-10-09 DIAGNOSIS — Z9989 Dependence on other enabling machines and devices: Secondary | ICD-10-CM | POA: Diagnosis not present

## 2019-10-09 DIAGNOSIS — R918 Other nonspecific abnormal finding of lung field: Secondary | ICD-10-CM | POA: Diagnosis not present

## 2019-10-10 ENCOUNTER — Encounter: Payer: Self-pay | Admitting: Osteopathic Medicine

## 2019-10-10 ENCOUNTER — Ambulatory Visit: Payer: BC Managed Care – PPO | Admitting: Osteopathic Medicine

## 2019-10-10 ENCOUNTER — Other Ambulatory Visit: Payer: Self-pay

## 2019-10-10 VITALS — BP 150/87 | HR 88 | Temp 98.1°F | Wt 291.0 lb

## 2019-10-10 DIAGNOSIS — E1165 Type 2 diabetes mellitus with hyperglycemia: Secondary | ICD-10-CM | POA: Diagnosis not present

## 2019-10-10 DIAGNOSIS — F331 Major depressive disorder, recurrent, moderate: Secondary | ICD-10-CM

## 2019-10-10 DIAGNOSIS — F419 Anxiety disorder, unspecified: Secondary | ICD-10-CM

## 2019-10-10 DIAGNOSIS — F3341 Major depressive disorder, recurrent, in partial remission: Secondary | ICD-10-CM | POA: Diagnosis not present

## 2019-10-10 DIAGNOSIS — M1612 Unilateral primary osteoarthritis, left hip: Secondary | ICD-10-CM | POA: Diagnosis not present

## 2019-10-10 DIAGNOSIS — E1169 Type 2 diabetes mellitus with other specified complication: Secondary | ICD-10-CM

## 2019-10-10 DIAGNOSIS — F5105 Insomnia due to other mental disorder: Secondary | ICD-10-CM

## 2019-10-10 DIAGNOSIS — M159 Polyosteoarthritis, unspecified: Secondary | ICD-10-CM

## 2019-10-10 DIAGNOSIS — I10 Essential (primary) hypertension: Secondary | ICD-10-CM

## 2019-10-10 DIAGNOSIS — E785 Hyperlipidemia, unspecified: Secondary | ICD-10-CM

## 2019-10-10 DIAGNOSIS — F3342 Major depressive disorder, recurrent, in full remission: Secondary | ICD-10-CM

## 2019-10-10 DIAGNOSIS — M8949 Other hypertrophic osteoarthropathy, multiple sites: Secondary | ICD-10-CM

## 2019-10-10 DIAGNOSIS — Z6841 Body Mass Index (BMI) 40.0 and over, adult: Secondary | ICD-10-CM

## 2019-10-10 LAB — POCT GLYCOSYLATED HEMOGLOBIN (HGB A1C): Hemoglobin A1C: 6.5 % — AB (ref 4.0–5.6)

## 2019-10-10 MED ORDER — ATORVASTATIN CALCIUM 40 MG PO TABS: 40.0000 mg | ORAL_TABLET | Freq: Every day | ORAL | 3 refills | Status: DC

## 2019-10-10 MED ORDER — TRIAMTERENE-HCTZ 37.5-25 MG PO TABS: 1.0000 | ORAL_TABLET | Freq: Every day | ORAL | 3 refills | Status: DC

## 2019-10-10 MED ORDER — TRAZODONE HCL 50 MG PO TABS: 50.0000 mg | ORAL_TABLET | Freq: Every evening | ORAL | 0 refills | Status: DC | PRN

## 2019-10-10 MED ORDER — VITAMIN D3 50 MCG (2000 UT) PO TABS: 2000.0000 [IU] | ORAL_TABLET | Freq: Every day | ORAL | 3 refills | Status: AC

## 2019-10-10 MED ORDER — ENALAPRIL MALEATE 10 MG PO TABS: 10.0000 mg | ORAL_TABLET | Freq: Every day | ORAL | 3 refills | Status: DC

## 2019-10-10 MED ORDER — RYBELSUS 3 MG PO TABS: 3.0000 mg | ORAL_TABLET | Freq: Every day | ORAL | 1 refills | Status: DC

## 2019-10-10 MED ORDER — VENLAFAXINE HCL ER 75 MG PO CP24: 75.0000 mg | ORAL_CAPSULE | Freq: Every day | ORAL | 3 refills | Status: DC

## 2019-10-10 NOTE — Telephone Encounter (Signed)
Pt was seen by provider today and meds inquiries were reviewed during the appt.

## 2019-10-10 NOTE — Progress Notes (Signed)
HPI: Bonnie [name] is a 68 y.o. female who  has a past medical history of Abnormal mammogram of right breast (01/20/2018), Benign cyst of right breast (01/26/2018), Centrilobular emphysema ([place]) (07/25/2018), Chronic kidney disease, stage 3a (12/31/2017), Chronic low back pain, Colon polyps, Depression, Fatty liver, Hypertension, Melanoma in situ of right upper extremity ([place]), Mild diastolic dysfunction (4/48/1856), Osteoarthritis of lumbar spine, Renal cancer ([place]), Skin cancer, and Sphincter of Oddi dysfunction.  she presents to [facility] today, 10/10/19,  for chief complaint of: Follow-up: mental health, MSK problems   Arthritis: Following w/ Dr T last seen 10/10/19 - severe hip OA and knee arthritis. L hip joint injection at that visit. Trying to get OrthoVisc approved for knww pain.  Norco 5-325 prn - PDMP reviewed: last filled #60 on 08/03/19, 05/04/19, 03/01/19, 01/17/19.  Also on APAP 650 to max total dose APAP 4000 mg / 24 h.   Mental health: Not on Wellbutrin and Effexor  Occasional use of Trazodone  Alprazolam 0.25 mg prn PDMP reviewed: last filled #30 on 08/24/19, 05/18/19  DM2: [name] had started Rybelsus/semaglutide but this was not started. Was doing ok on the Ozempic but few days after the injection would feel kind of "blah."  Intolerant to metformin. A1C today:  A1C  05/25/19: 5.7  Obesity: Per Dr Mcneil Sober note pt agreeable to bariatric surgery if needed. Obesity is definitely complicating her arthritic conditions as well as metabolic conditions, OSA  Wt Readings from Last 3 Encounters:  09/25/19 295 lb (133.8 kg)  09/05/19 285 lb (129.3 kg)  08/24/19 290 lb (131.5 kg)   HTN/CV:  Took Rx 45-60 mins ago  Meds: ASA 81, Lipitor 40, Enalapril 10, Triamterene-HCT 37.5-25,  BP Readings from Last 3 Encounters:  10/10/19 (!) 150/87  09/25/19 (!) 154/84  09/05/19 127/71         Past medical, surgical, social and family history  reviewed:  Patient Active Problem List   Diagnosis Date Noted  . Acute kidney injury superimposed on CKD ([place]) 09/17/2019  . Insomnia secondary to anxiety 09/17/2019  . Increase in serum creatinine from prior measurement 09/17/2019  . Abnormal nuclear stress test 09/04/2019  . Mild diastolic dysfunction 31/49/7026  . Right bundle branch block 07/05/2019  . Left axis deviation 07/05/2019  . Decreased exercise tolerance 07/05/2019  . Sphincter of Oddi dysfunction 07/05/2019  . Multiple pulmonary nodules determined by computed tomography of lung 07/05/2019  . Abnormal QT interval present on electrocardiogram 07/05/2019  . Irregular heart beats 06/29/2019  . Family history of CHF (congestive heart failure) 06/29/2019  . Recurrent major depressive disorder, in partial remission ([place]) 06/12/2019  . Encounter for diabetic foot exam ([place]) 06/12/2019  . Grief reaction 05/18/2019  . History of colon polyps 01/17/2019  . Encounter for long-term (current) use of high-risk medication 01/17/2019  . Primary osteoarthritis of left hip 12/19/2018  . Left hip pain 10/13/2018  . Centrilobular emphysema ([place]) 07/25/2018  . Closed compression fracture of body of L1 vertebra ([place]) 07/25/2018  . Renal cyst, acquired, left 07/25/2018  . Primary osteoarthritis involving multiple joints 07/06/2018  . Diabetic eye exam ([place]) 07/06/2018  . History of multiple pulmonary nodules 07/06/2018  . Encounter for screening for lung cancer 07/06/2018  . Type 2 diabetes mellitus with hyperglycemia, without long-term current use of insulin ([place]) 04/04/2018  . Dyslipidemia associated with type 2 diabetes mellitus ([place]) 04/04/2018  . Class 3 severe obesity due to excess calories with serious comorbidity and body mass index (  BMI) of 40.0 to 44.9 in adult [facility]) 04/03/2018  . Prediabetes 04/03/2018  . Encounter for weight loss counseling 04/03/2018  . Onychomycosis 03/28/2018  . Morbid obesity ([place]) 02/28/2018  . Bilateral  foot pain 02/28/2018  . Benign cyst of right breast 01/26/2018  . Abnormal mammogram of right breast 01/20/2018  . Chronic kidney disease, stage 3a 12/31/2017  . Encounter for chronic pain management 12/30/2017  . Chronic use of opiate for therapeutic purpose 12/30/2017  . History of melanoma 12/30/2017  . History of renal cell carcinoma 12/30/2017  . Craniofacial pain syndrome 08/31/2017  . Arthritis of carpometacarpal [facility]) joint of left thumb 05/19/2017  . Controlled substance agreement signed 03/30/2017  . H/O acute pancreatitis 07/17/2016  . History of biliary stent insertion 07/17/2016  . Coronary artery calcification seen on CT scan 11/08/2015  . History of back surgery 09/23/2015  . Spondylosis of lumbar region without myelopathy or radiculopathy 08/30/2015  . Primary osteoarthritis of both knees 03/01/2015  . Cellulitis of face 02/19/2015  . Anxiety 07/24/2014  . Basal cell carcinoma 10/04/2013  . Depression, major 10/04/2013  . Squamous cell carcinoma 10/04/2013  . Dyspnea on exertion -> considered to be possible anginal equivalent 04/20/2012  . Essential hypertension 03/29/2012  . Former smoker 03/29/2012  . Gout 03/29/2012  . Hyperlipidemia 03/29/2012  . Palpitations 03/29/2012  . OSA on CPAP 03/29/2012    Past Surgical History:  Procedure Laterality Date  . ABDOMINAL HYSTERECTOMY    . BILE DUCT STENT PLACEMENT    . CHOLECYSTECTOMY    . LEFT HEART CATH AND CORONARY ANGIOGRAPHY N/A 09/05/2019   Procedure: LEFT HEART CATH AND CORONARY ANGIOGRAPHY;  Surgeon: Leonie Man, MD;  Location: [facility];  Service: Cardiovascular;  Laterality: N/A;  . LUMBAR FUSION    . MOHS SURGERY     x 11  . PARTIAL NEPHRECTOMY      Social History   Tobacco Use  . Smoking status: Former Smoker    Packs/day: 1.00    Years: 40.00    Pack years: 40.00    Types: Cigarettes    Quit date: 12/21/2010    Years since quitting: 8.8  . Smokeless tobacco: Never Used  Substance  Use Topics  . Alcohol use: Yes    Alcohol/week: 1.0 - 2.0 standard drinks    Types: 1 - 2 Standard drinks or equivalent per week    Family History  Problem Relation Age of Onset  . Depression Mother   . Hyperlipidemia Mother   . Hypertension Mother   . Heart attack Father   . Hyperlipidemia Father   . Hypertension Father   . Alcohol abuse Sister   . Depression Sister   . Alcohol abuse Son   . Alcohol abuse Son   . Alcohol abuse Sister   . Depression Sister      Current medication list and allergy/intolerance information reviewed:    Current Outpatient Medications  Medication Sig Dispense Refill  . acetaminophen (TYLENOL) 650 MG CR tablet Take 1 tablet (650 mg total) by mouth every 8 (eight) hours. MAX 4G/DAY (Patient taking differently: Take 650 mg by mouth every 8 (eight) hours as needed for pain. MAX 4G/DAY) 90 tablet 3  . ALPRAZolam (XANAX) 0.25 MG tablet Take 1-2 tablets (0.25-0.5 mg total) by mouth at bedtime as needed for anxiety or sleep. DO NOT COMBINE WITH HYDROCODONE/NORCO 30 tablet 0  . aspirin 81 MG tablet Take 81 mg by mouth daily.     Marland Kitchen  atorvastatin (LIPITOR) 40 MG tablet Take 1 tablet (40 mg total) by mouth daily. (Patient taking differently: Take 40 mg by mouth daily at 6 PM. ) 90 tablet 3  . blood glucose meter kit and supplies KIT Check morning fasting blood glucose and up to four times daily as directed. 1 each 0  . buPROPion (WELLBUTRIN SR) 150 MG 12 hr tablet Take 1 tablet (150 mg total) by mouth every morning. 90 tablet 1  . Cholecalciferol (VITAMIN D3) 2000 units TABS Take 2,000 Units by mouth daily.     . enalapril (VASOTEC) 10 MG tablet TAKE 1 TABLET DAILY (Patient taking differently: Take 10 mg by mouth daily. ) 90 tablet 4  . HYDROcodone-acetaminophen (NORCO/VICODIN) 5-325 MG tablet Take 1 tablet by mouth every 6 (six) hours as needed for moderate pain or severe pain. 15 tablet 0  . Multiple Vitamin tablet Take 1 tablet by mouth daily.     . Omega-3  Fatty Acids (FISH OIL) 1000 MG CAPS Take 1,000 mg by mouth daily.     . ONE TOUCH ULTRA TEST test strip     . ONETOUCH DELICA LANCETS 16L MISC     . Semaglutide (RYBELSUS) 3 MG TABS Take 3 mg by mouth daily before breakfast. 30 tablet 5  . traZODone (DESYREL) 50 MG tablet TAKE 1 TABLET AT BEDTIME AS NEEDED 90 tablet 0  . triamterene-hydrochlorothiazide (MAXZIDE-25) 37.5-25 MG tablet TAKE 1 TABLET DAILY (Patient taking differently: Take 1 tablet by mouth daily. ) 90 tablet 3  . venlafaxine XR (EFFEXOR-XR) 75 MG 24 hr capsule Take 2 capsules (150 mg total) by mouth daily with breakfast. (Patient taking differently: Take 75 mg by mouth daily with breakfast. ) 180 capsule 1   No current facility-administered medications for this visit.     Allergies  Allergen Reactions  . Latex Itching and Rash  . Metformin And Related Other (See Comments)    Malaise, fatigue  . Morphine And Related Rash and Other (See Comments)    Agitation  . Tape Swelling and Other (See Comments)    Burning sensation  . Tramadol Palpitations    Heart fluttering      Review of Systems:  Constitutional:  No  fever, no chills, No recent illness, No unintentional weight changes. No significant fatigue.   HEENT: No  headache, no vision change  Cardiac: No  chest pain, No  pressure, No palpitations  Respiratory:  No  shortness of breath. No  Cough  Gastrointestinal: No  abdominal pain  Musculoskeletal: No new myalgia/arthralgia  Skin: No  Rash  Neurologic: No  weakness, No  dizziness,  Psychiatric: No  concerns with depression, No  concerns with anxiety  Exam:  BP (!) 150/87 (BP Location: Right Arm, Patient Position: Sitting, Cuff Size: Large)   Pulse 88   Temp 98.1 F (36.7 C) (Oral)   Wt 291 lb 0.6 oz (132 kg)   BMI 42.36 kg/m   Constitutional: VS see above. General Appearance: alert, well-developed, well-nourished, NAD  Eyes: Normal lids and conjunctive, non-icteric sclera  Neck: No masses,  trachea midline. No thyroid enlargement. No tenderness/mass appreciated. No lymphadenopathy  Respiratory: Normal respiratory effort. no wheeze, no rhonchi, no rales  Cardiovascular: S1/S2 normal, no murmur, no rub/gallop auscultated. RRR. No lower extremity edema.  Gastrointestinal: Nontender, no masses.  Musculoskeletal: Gait normal.   Neurological: Normal balance/coordination. No tremor.   Skin: warm, dry, intact. No rash/ulcer.   Psychiatric: Normal judgment/insight. Normal mood and affect. Oriented x3.  No results found for this or any previous visit (from the past 72 hour(s)).  No results found.   ASSESSMENT/PLAN: The primary encounter diagnosis was Type 2 diabetes mellitus with hyperglycemia, without long-term current use of insulin ([place]). Diagnoses of Insomnia secondary to anxiety, Recurrent major depressive disorder, in partial remission ([place]), Primary osteoarthritis of left hip, Primary osteoarthritis involving multiple joints, Dyslipidemia associated with type 2 diabetes mellitus ([place]), Class 3 severe obesity due to excess calories with serious comorbidity and body mass index (BMI) of 40.0 to 44.9 in adult [facility]), Moderate episode of recurrent major depressive disorder ([place]), Essential hypertension, and Recurrent major depressive disorder, in full remission ([place]) were also pertinent to this visit.  Very nice lady, all chronic issues stable at this time OK to maintain refills   Orders Placed This Encounter  Procedures  . POCT HgB A1C    Meds ordered this encounter  Medications  . atorvastatin (LIPITOR) 40 MG tablet    Sig: Take 1 tablet (40 mg total) by mouth daily.    Dispense:  90 tablet    Refill:  3  . enalapril (VASOTEC) 10 MG tablet    Sig: Take 1 tablet (10 mg total) by mouth daily.    Dispense:  90 tablet    Refill:  3  . Cholecalciferol (VITAMIN D3) 50 MCG (2000 UT) TABS    Sig: Take 2,000 Units by mouth daily.    Dispense:  90 tablet    Refill:  3  .  Semaglutide (RYBELSUS) 3 MG TABS    Sig: Take 3 mg by mouth daily before breakfast.    Dispense:  90 tablet    Refill:  1    Patient failed Ozempic  . traZODone (DESYREL) 50 MG tablet    Sig: Take 1 tablet (50 mg total) by mouth at bedtime as needed.    Dispense:  90 tablet    Refill:  0  . triamterene-hydrochlorothiazide (MAXZIDE-25) 37.5-25 MG tablet    Sig: Take 1 tablet by mouth daily.    Dispense:  90 tablet    Refill:  3  . venlafaxine XR (EFFEXOR-XR) 75 MG 24 hr capsule    Sig: Take 1 capsule (75 mg total) by mouth daily with breakfast.    Dispense:  90 capsule    Refill:  3         Visit summary with medication list and pertinent instructions was printed for patient to review. All questions at time of visit were answered - patient instructed to contact office with any additional concerns or updates. ER/RTC precautions were reviewed with the patient.   Note: Total time spent 40 minutes, greater than 50% of the visit was spent face-to-face counseling and coordinating care for the above diagnoses listed in assessment/plan.   Please note: voice recognition software was used to produce this document, and typos may escape review. Please contact Dr. Sheppard Coil for any needed clarifications.     Follow-up plan: Return in about 3 months (around 01/10/2020) for A1C recheck, see [place] sooner if needed! Marland Kitchen

## 2019-10-14 ENCOUNTER — Other Ambulatory Visit: Payer: Self-pay | Admitting: Physician Assistant

## 2019-10-14 DIAGNOSIS — E1165 Type 2 diabetes mellitus with hyperglycemia: Secondary | ICD-10-CM

## 2019-10-14 DIAGNOSIS — E1169 Type 2 diabetes mellitus with other specified complication: Secondary | ICD-10-CM

## 2019-10-14 DIAGNOSIS — E785 Hyperlipidemia, unspecified: Secondary | ICD-10-CM

## 2019-10-14 DIAGNOSIS — M1A0710 Idiopathic chronic gout, right ankle and foot, without tophus (tophi): Secondary | ICD-10-CM

## 2019-10-20 ENCOUNTER — Encounter: Payer: Self-pay | Admitting: Gastroenterology

## 2019-10-23 DIAGNOSIS — G4733 Obstructive sleep apnea (adult) (pediatric): Secondary | ICD-10-CM | POA: Diagnosis not present

## 2019-10-26 ENCOUNTER — Telehealth: Payer: Self-pay

## 2019-10-26 DIAGNOSIS — M159 Polyosteoarthritis, unspecified: Secondary | ICD-10-CM

## 2019-10-26 DIAGNOSIS — Z79891 Long term (current) use of opiate analgesic: Secondary | ICD-10-CM

## 2019-10-26 MED ORDER — HYDROCODONE-ACETAMINOPHEN 5-325 MG PO TABS: 1.0000 | ORAL_TABLET | Freq: Four times a day (QID) | ORAL | 0 refills | Status: DC | PRN

## 2019-10-26 NOTE — Telephone Encounter (Signed)
As per pt's message, she does not require a call back. She will wait for pharmacy to contact her regarding p/u for med refill.

## 2019-10-26 NOTE — Telephone Encounter (Signed)
Sent!

## 2019-10-26 NOTE — Telephone Encounter (Signed)
Pt called requesting med refill for hydrocodone. Pls send rx to [facility] in [place].

## 2019-10-27 ENCOUNTER — Encounter (HOSPITAL_COMMUNITY): Payer: Self-pay

## 2019-10-27 ENCOUNTER — Emergency Department (HOSPITAL_COMMUNITY)
Admission: EM | Admit: 2019-10-27 | Discharge: 2019-10-27 | Disposition: A | Discharge status: Home / Self Care | Payer: BC Managed Care – PPO | Attending: Emergency Medicine | Admitting: Emergency Medicine

## 2019-10-27 ENCOUNTER — Other Ambulatory Visit: Payer: Self-pay

## 2019-10-27 ENCOUNTER — Telehealth: Payer: Self-pay

## 2019-10-27 DIAGNOSIS — L989 Disorder of the skin and subcutaneous tissue, unspecified: Secondary | ICD-10-CM | POA: Insufficient documentation

## 2019-10-27 DIAGNOSIS — R21 Rash and other nonspecific skin eruption: Secondary | ICD-10-CM | POA: Diagnosis not present

## 2019-10-27 LAB — COMPREHENSIVE METABOLIC PANEL
ALT: 33 U/L (ref 0–44)
AST: 23 U/L (ref 15–41)
Albumin: 3.4 g/dL — ABNORMAL LOW (ref 3.5–5.0)
Alkaline Phosphatase: 93 U/L (ref 38–126)
Anion gap: 11 (ref 5–15)
BUN: 19 mg/dL (ref 8–23)
CO2: 25 mmol/L (ref 22–32)
Calcium: 9.5 mg/dL (ref 8.9–10.3)
Chloride: 104 mmol/L (ref 98–111)
Creatinine, Ser: 1.08 mg/dL — ABNORMAL HIGH (ref 0.44–1.00)
GFR calc Af Amer: >60 mL/min (ref >60)
GFR calc non Af Amer: 53 mL/min — ABNORMAL LOW (ref >60)
Glucose, Bld: 103 mg/dL — ABNORMAL HIGH (ref 70–99)
Potassium: 3.9 mmol/L (ref 3.5–5.1)
Sodium: 140 mmol/L (ref 135–145)
Total Bilirubin: 0.8 mg/dL (ref 0.3–1.2)
Total Protein: 6.7 g/dL (ref 6.5–8.1)

## 2019-10-27 LAB — CBC WITH DIFFERENTIAL/PLATELET
Abs Immature Granulocytes: 0.02 10*3/uL (ref 0.00–0.07)
Basophils Absolute: 0.1 10*3/uL (ref 0.0–0.1)
Basophils Relative: 1 %
Eosinophils Absolute: 0.1 10*3/uL (ref 0.0–0.5)
Eosinophils Relative: 2 %
HCT: 38.5 % (ref 36.0–46.0)
Hemoglobin: 12.4 g/dL (ref 12.0–15.0)
Immature Granulocytes: 0 %
Lymphocytes Relative: 26 %
Lymphs Abs: 1.7 10*3/uL (ref 0.7–4.0)
MCH: 30.2 pg (ref 26.0–34.0)
MCHC: 32.2 g/dL (ref 30.0–36.0)
MCV: 93.7 fL (ref 80.0–100.0)
Monocytes Absolute: 0.5 10*3/uL (ref 0.1–1.0)
Monocytes Relative: 7 %
Neutro Abs: 4.1 10*3/uL (ref 1.7–7.7)
Neutrophils Relative %: 64 %
Platelets: 316 10*3/uL (ref 150–400)
RBC: 4.11 MIL/uL (ref 3.87–5.11)
RDW: 13.3 % (ref 11.5–15.5)
WBC: 6.5 10*3/uL (ref 4.0–10.5)
nRBC: 0 % (ref 0.0–0.2)

## 2019-10-27 LAB — PROTIME-INR
INR: 0.9 (ref 0.8–1.2)
Prothrombin Time: 11.8 seconds (ref 11.4–15.2)

## 2019-10-27 MED ORDER — AMOXICILLIN-POT CLAVULANATE 875-125 MG PO TABS: 1.0000 | ORAL_TABLET | Freq: Two times a day (BID) | ORAL | 0 refills | Status: DC

## 2019-10-27 NOTE — Discharge Instructions (Addendum)
Labs are normal including platelets, hemoglobin, white count   Lesions are likely from mild trauma form puppy  Elevate your legs to help with swelling, bleeding pain  Augmentin to cover possible dog bite/scratches  Return for increased bleeding, lesions, pain, fevers

## 2019-10-27 NOTE — Telephone Encounter (Signed)
Noted, agree this is not something I can evaluate well virtually and no appts available, urgent care or ER visit would be advised.

## 2019-10-27 NOTE — ED Triage Notes (Signed)
Pt arrives POV for eval of RLE pain, bloody rash and new bruising. Pt insists on no trauma to RLE. Pt reports she had a new puppy and has scattered abrasions and cuts to R arm that look similar to R leg, but pt reports she believes they are unrelated. Reports she has just started a new medication for her diabetes. Pt reports puppy w/ new staph infection but unsure if related.

## 2019-10-27 NOTE — Telephone Encounter (Signed)
Starting last night, patient noticed she had a blood stain about the size of a half dollar on her pant leg. Patient states there is no pain, just constant bleeding. Patient states that she has been monitoring this and more places have opened up and started bleeding overnight and this morning. Patient described these places more like sores than cuts. She currently has several only on the right leg. Notes that at top of shin she has a spot, small space, another spot, etc. Patient also notes red lines down and across on her legs.  She states that she has been bandaging these areas but bandages are quickly soaked in blood.    Patient states she has a history of blood infection where she almost lost her hand/arm due to a splinter she had. I advised patient to seek medical attention ASAP at closest ER. Patient very hesitant and not wanting to make trip, I advised patient this was the best option and she could get the most help there.H  Patient agreed to ask husband to take to ER.  FYI to PCP

## 2019-10-27 NOTE — ED Provider Notes (Signed)
[facility] EMERGENCY DEPARTMENT Provider Note   CSN: [id number] Arrival date & time: 10/27/19  1142     History   Chief Complaint Chief Complaint  Patient presents with  . Rash on Leg    HPI Bonnie [name] is a 68 y.o. female presents to ER for evaluation of lesions in lower extremities. She noticed something warm in her right shin last night and noticed a spot of blood approx size of a half dollar on her pants.  Noticed two linear cuts and didn't know where they came from. States she just got a puppy who has been biting/scratching at her. She has several healing wounds and abrasions in upper and lower extremities that she attributes to the puppy.  States she does not remember if the puppy jumped on her specifically in the right leg but isn't sure.  Has noticed several areas of bruising as well that are mildly tender.  She has diffuse cherry spots/petichiae in upper/lower extremities and purple non tender flat lesions on lips but states this is not new.  Takes a baby aspirin daily. No anticoagulants. No known bleeding or liver disorders. No interventions. No modifying factors.      HPI  Past Medical History:  Diagnosis Date  . Abnormal mammogram of right breast 01/20/2018  . Benign cyst of right breast 01/26/2018  . Centrilobular emphysema ([place]) 07/25/2018  . Chronic kidney disease, stage 3a 12/31/2017  . Chronic low back pain   . Colon polyps   . Depression   . Fatty liver   . Hypertension   . Melanoma in situ of right upper extremity ([place])   . Mild diastolic dysfunction 05/27/3709  . Osteoarthritis of lumbar spine   . Renal cancer ([place])   . Skin cancer   . Sphincter of Oddi dysfunction     Patient Active Problem List   Diagnosis Date Noted  . Acute kidney injury superimposed on CKD ([place]) 09/17/2019  . Insomnia secondary to anxiety 09/17/2019  . Increase in serum creatinine from prior measurement 09/17/2019  . Abnormal nuclear stress test 09/04/2019  . Mild  diastolic dysfunction 62/69/4854  . Right bundle branch block 07/05/2019  . Left axis deviation 07/05/2019  . Decreased exercise tolerance 07/05/2019  . Sphincter of Oddi dysfunction 07/05/2019  . Multiple pulmonary nodules determined by computed tomography of lung 07/05/2019  . Abnormal QT interval present on electrocardiogram 07/05/2019  . Irregular heart beats 06/29/2019  . Family history of CHF (congestive heart failure) 06/29/2019  . Recurrent major depressive disorder, in partial remission ([place]) 06/12/2019  . Encounter for diabetic foot exam ([place]) 06/12/2019  . Grief reaction 05/18/2019  . History of colon polyps 01/17/2019  . Encounter for long-term (current) use of high-risk medication 01/17/2019  . Primary osteoarthritis of left hip 12/19/2018  . Left hip pain 10/13/2018  . Centrilobular emphysema ([place]) 07/25/2018  . Closed compression fracture of body of L1 vertebra ([place]) 07/25/2018  . Renal cyst, acquired, left 07/25/2018  . Primary osteoarthritis involving multiple joints 07/06/2018  . Diabetic eye exam ([place]) 07/06/2018  . History of multiple pulmonary nodules 07/06/2018  . Encounter for screening for lung cancer 07/06/2018  . Type 2 diabetes mellitus with hyperglycemia, without long-term current use of insulin ([place]) 04/04/2018  . Dyslipidemia associated with type 2 diabetes mellitus ([place]) 04/04/2018  . Class 3 severe obesity due to excess calories with serious comorbidity and body mass index (BMI) of 40.0 to 44.9 in adult ([place]) 04/03/2018  . Prediabetes 04/03/2018  .  Encounter for weight loss counseling 04/03/2018  . Onychomycosis 03/28/2018  . Morbid obesity ([place]) 02/28/2018  . Bilateral foot pain 02/28/2018  . Benign cyst of right breast 01/26/2018  . Abnormal mammogram of right breast 01/20/2018  . Chronic kidney disease, stage 3a 12/31/2017  . Encounter for chronic pain management 12/30/2017  . Chronic use of opiate for therapeutic purpose 12/30/2017  . History of  melanoma 12/30/2017  . History of renal cell carcinoma 12/30/2017  . Craniofacial pain syndrome 08/31/2017  . Arthritis of carpometacarpal [facility]) joint of left thumb 05/19/2017  . Controlled substance agreement signed 03/30/2017  . H/O acute pancreatitis 07/17/2016  . History of biliary stent insertion 07/17/2016  . Coronary artery calcification seen on CT scan 11/08/2015  . History of back surgery 09/23/2015  . Spondylosis of lumbar region without myelopathy or radiculopathy 08/30/2015  . Primary osteoarthritis of both knees 03/01/2015  . Cellulitis of face 02/19/2015  . Anxiety 07/24/2014  . Basal cell carcinoma 10/04/2013  . Depression, major 10/04/2013  . Squamous cell carcinoma 10/04/2013  . Dyspnea on exertion -> considered to be possible anginal equivalent 04/20/2012  . Essential hypertension 03/29/2012  . Former smoker 03/29/2012  . Gout 03/29/2012  . Hyperlipidemia 03/29/2012  . Palpitations 03/29/2012  . OSA on CPAP 03/29/2012    Past Surgical History:  Procedure Laterality Date  . ABDOMINAL HYSTERECTOMY    . BILE DUCT STENT PLACEMENT    . CHOLECYSTECTOMY    . LEFT HEART CATH AND CORONARY ANGIOGRAPHY N/A 09/05/2019   Procedure: LEFT HEART CATH AND CORONARY ANGIOGRAPHY;  Surgeon: Leonie Man, MD;  Location: [facility] CV LAB;  Service: Cardiovascular;  Laterality: N/A;  . LUMBAR FUSION    . MOHS SURGERY     x 11  . PARTIAL NEPHRECTOMY       OB History    Gravida  2   Para  2   Term      Preterm      AB      Living  2     SAB      TAB      Ectopic      Multiple      Live Births  2            Home Medications    Prior to Admission medications   Medication Sig Start Date End Date Taking? Authorizing Provider  acetaminophen (TYLENOL) 650 MG CR tablet Take 1 tablet (650 mg total) by mouth every 8 (eight) hours. MAX 4G/DAY Patient taking differently: Take 650 mg by mouth every 8 (eight) hours as needed for pain. MAX 4G/DAY 08/24/19    Trixie Dredge, PA-C  allopurinol (ZYLOPRIM) 100 MG tablet TAKE 1 TABLET DAILY 10/17/19   Emeterio Reeve, DO  ALPRAZolam Duanne Moron) 0.25 MG tablet Take 1-2 tablets (0.25-0.5 mg total) by mouth at bedtime as needed for anxiety or sleep. DO NOT COMBINE WITH HYDROCODONE/NORCO 08/24/19   Trixie Dredge, PA-C  amoxicillin-clavulanate (AUGMENTIN) 875-125 MG tablet Take 1 tablet by mouth every 12 (twelve) hours. 10/27/19   Kinnie Feil, PA-C  aspirin 81 MG tablet Take 81 mg by mouth daily.     [provider]  atorvastatin (LIPITOR) 40 MG tablet TAKE 1 TABLET DAILY 10/17/19   Emeterio Reeve, DO  blood glucose meter kit and supplies KIT Check morning fasting blood glucose and up to four times daily as directed. 04/04/18   Trixie Dredge, PA-C  Cholecalciferol (VITAMIN D3) 50 MCG [phone]  UT) TABS Take 2,000 Units by mouth daily. 10/10/19   Emeterio Reeve, DO  enalapril (VASOTEC) 10 MG tablet Take 1 tablet (10 mg total) by mouth daily. 10/10/19   Emeterio Reeve, DO  HYDROcodone-acetaminophen (NORCO/VICODIN) 5-325 MG tablet Take 1 tablet by mouth every 6 (six) hours as needed for moderate pain or severe pain. 10/26/19   Emeterio Reeve, DO  Multiple Vitamin tablet Take 1 tablet by mouth daily.     [provider]  Omega-3 Fatty Acids (FISH OIL) 1000 MG CAPS Take 1,000 mg by mouth daily.     [provider]  ONE TOUCH ULTRA TEST test strip  04/04/18   [provider]  Jonetta Speak LANCETS 35K [place]  04/04/18   [provider]  Semaglutide (RYBELSUS) 3 MG TABS Take 3 mg by mouth daily before breakfast. 10/10/19   Emeterio Reeve, DO  traZODone (DESYREL) 50 MG tablet Take 1 tablet (50 mg total) by mouth at bedtime as needed. 10/10/19   Emeterio Reeve, DO  triamterene-hydrochlorothiazide (MAXZIDE-25) 37.5-25 MG tablet Take 1 tablet by mouth daily. 10/10/19   Emeterio Reeve, DO  venlafaxine XR (EFFEXOR-XR)  75 MG 24 hr capsule Take 1 capsule (75 mg total) by mouth daily with breakfast. 10/10/19   Emeterio Reeve, DO    Family History Family History  Problem Relation Age of Onset  . Depression Mother   . Hyperlipidemia Mother   . Hypertension Mother   . Heart attack Father   . Hyperlipidemia Father   . Hypertension Father   . Alcohol abuse Sister   . Depression Sister   . Alcohol abuse Son   . Alcohol abuse Son   . Alcohol abuse Sister   . Depression Sister     Social History Social History   Tobacco Use  . Smoking status: Former Smoker    Packs/day: 1.00    Years: 40.00    Pack years: 40.00    Types: Cigarettes    Quit date: 12/21/2010    Years since quitting: 8.8  . Smokeless tobacco: Never Used  Substance Use Topics  . Alcohol use: Yes    Alcohol/week: 1.0 - 2.0 standard drinks    Types: 1 - 2 Standard drinks or equivalent per week  . Drug use: No     Allergies   Latex, Metformin and related, Morphine and related, Tape, and Tramadol   Review of Systems Review of Systems  Skin:       Lesions   All other systems reviewed and are negative.    Physical Exam Updated Vital Signs BP (!) 141/66   Pulse 71   Temp 98.2 F (36.8 C) (Oral)   Resp 16   Ht 5' 10" (1.778 m)   Wt 129.3 kg   SpO2 98%   BMI 40.89 kg/m   Physical Exam Constitutional:      Appearance: She is well-developed.  HENT:     Head: Normocephalic.     Nose: Nose normal.  Eyes:     General: Lids are normal.  Neck:     Musculoskeletal: Normal range of motion.  Cardiovascular:     Rate and Rhythm: Normal rate.  Pulmonary:     Effort: Pulmonary effort is normal. No respiratory distress.  Musculoskeletal: Normal range of motion.  Skin:    Comments: Several abrasions and circular healing lesions in upper forearms and distal lower extremities.  Several non tender non blanchable petechial lesions throughout body arms, legs including thighs non tender.  Several circular  ecchymotic lesions  in bilateral lower extremities mildly tender. No warmth, fluctuance or abscess in LE or UEs. Compartments soft, calves non tender.  2 linear abrasions in RLE distal shin with dried up blood the most inferior abrasion is weeping clear fluid.   Neurological:     Mental Status: She is alert.  Psychiatric:        Behavior: Behavior normal.          ED Treatments / Results  Labs (all labs ordered are listed, but only abnormal results are displayed) Labs Reviewed  COMPREHENSIVE METABOLIC PANEL - Abnormal; Notable for the following components:      Result Value   Glucose, Bld 103 (*)    Creatinine, Ser 1.08 (*)    Albumin 3.4 (*)    GFR calc non Af Amer 53 (*)    All other components within normal limits  CBC WITH DIFFERENTIAL/PLATELET  PROTIME-INR    EKG None  Radiology No results found.  Procedures Procedures (including critical care time)  Medications Ordered in ED Medications - No data to display   Initial Impression / Assessment and Plan / ED Course  I have reviewed the triage vital signs and the nursing notes.  Pertinent labs & imaging results that were available during my care of the patient were reviewed by me and considered in my medical decision making (see chart for details).  68 yo F with diabetes here with several different lesions in LEs and UEs.  Some ecchymotic lesions, petechial lesions and abrasions.  Some appear old and some are newer.  This is in setting of 46 month old puppy that is jumping, scratching and biting her.  Takes aspirin but no other anticoagulants. No known liver or bleeding disease.  Labs interpreted by me, normal PLT, LFTs, hgb.  No leukocytosis.  Suspect wounds in different stages of healing likely just from trauma from puppy.  No signs of infection, cellulitis, abscess. Compartments are soft in LE no lower extremities.  No itching, burning and not consistent with contact dermatitis, shingles, tick born illness.  Given dog exposure, h/o  diabetes will cover with augmentin. Recommended close monitor of symptoms and f/u with PCP. Return precautions discussed. Pt and husband in agreement and comfortable with plan. Showed photos and discussed case with EDP who agrees with ER management and disposition.   Final Clinical Impressions(s) / ED Diagnoses   Final diagnoses:  Skin lesion of right lower extremity    ED Discharge Orders         Ordered    amoxicillin-clavulanate (AUGMENTIN) 875-125 MG tablet  Every 12 hours     10/27/19 1536           Kinnie Feil, [place] 10/27/19 1536    Gareth Morgan, MD 10/27/19 2105

## 2019-11-01 DIAGNOSIS — C649 Malignant neoplasm of unspecified kidney, except renal pelvis: Secondary | ICD-10-CM | POA: Diagnosis not present

## 2019-11-01 DIAGNOSIS — C642 Malignant neoplasm of left kidney, except renal pelvis: Secondary | ICD-10-CM | POA: Diagnosis not present

## 2019-11-19 ENCOUNTER — Other Ambulatory Visit: Payer: Self-pay | Admitting: Physician Assistant

## 2019-11-19 DIAGNOSIS — F3342 Major depressive disorder, recurrent, in full remission: Secondary | ICD-10-CM

## 2019-11-22 DIAGNOSIS — G4733 Obstructive sleep apnea (adult) (pediatric): Secondary | ICD-10-CM | POA: Diagnosis not present

## 2019-11-24 ENCOUNTER — Other Ambulatory Visit: Payer: Self-pay

## 2019-11-24 ENCOUNTER — Telehealth: Payer: Self-pay

## 2019-11-24 ENCOUNTER — Encounter: Payer: Self-pay | Admitting: Gastroenterology

## 2019-11-24 ENCOUNTER — Ambulatory Visit (AMBULATORY_SURGERY_CENTER): Payer: BC Managed Care – PPO

## 2019-11-24 VITALS — Temp 96.9°F | Ht 69.5 in | Wt 301.4 lb

## 2019-11-24 DIAGNOSIS — Z1159 Encounter for screening for other viral diseases: Secondary | ICD-10-CM

## 2019-11-24 DIAGNOSIS — M159 Polyosteoarthritis, unspecified: Secondary | ICD-10-CM

## 2019-11-24 DIAGNOSIS — Z86010 Personal history of colonic polyps: Secondary | ICD-10-CM

## 2019-11-24 DIAGNOSIS — Z79891 Long term (current) use of opiate analgesic: Secondary | ICD-10-CM

## 2019-11-24 MED ORDER — NA SULFATE-K SULFATE-MG SULF 17.5-3.13-1.6 GM/177ML PO SOLN: 1.0000 | Freq: Once | ORAL | 0 refills | Status: AC

## 2019-11-24 NOTE — Progress Notes (Signed)
No egg or soy allergy known to patient  No issues with past sedation with any surgeries  or procedures, no intubation problems  No diet pills per patient No home 02 use per patient  No blood thinners per patient  Pt denies issues with constipation  No A fib or A flutter  EMMI video sent to pt's e mail  suprep coupon given Due to the COVID-19 pandemic we are asking patients to follow these guidelines. Please only bring one care partner. Please be aware that your care partner may wait in the car in the parking lot or if they feel like they will be too hot to wait in the car, they may wait in the lobby on the 4th floor. All care partners are required to wear a mask the entire time (we do not have any that we can provide them), they need to practice social distancing, and we will do a Covid check for all patient's and care partners when you arrive. Also we will check their temperature and your temperature. If the care partner waits in their car they need to stay in the parking lot the entire time and we will call them on their cell phone when the patient is ready for discharge so they can bring the car to the front of the building. Also all patient's will need to wear a mask into building.  

## 2019-11-24 NOTE — Telephone Encounter (Signed)
Pt called requesting med refill for hydrocodone. As per pt, [name] was giving her #60 = 3 mths supply. If appropriate, pls send 3 mths rx supply to pharmacy.   Pt also mentioned that she noticed that she was to increase Semaglutide to 7 mg. However, pt is currently taking 3 mg daily. Does provider recommend 7mg ? If so an updated rx will need to be sent to the pharmacy. If appropriate, send updated rx to [facility]. Thanks.

## 2019-11-27 ENCOUNTER — Other Ambulatory Visit: Payer: Self-pay

## 2019-11-27 ENCOUNTER — Ambulatory Visit (INDEPENDENT_AMBULATORY_CARE_PROVIDER_SITE_OTHER): Payer: BC Managed Care – PPO

## 2019-11-27 ENCOUNTER — Telehealth: Payer: Self-pay | Admitting: *Deleted

## 2019-11-27 DIAGNOSIS — M1612 Unilateral primary osteoarthritis, left hip: Secondary | ICD-10-CM

## 2019-11-27 DIAGNOSIS — L821 Other seborrheic keratosis: Secondary | ICD-10-CM | POA: Diagnosis not present

## 2019-11-27 DIAGNOSIS — D1801 Hemangioma of skin and subcutaneous tissue: Secondary | ICD-10-CM | POA: Diagnosis not present

## 2019-11-27 DIAGNOSIS — L814 Other melanin hyperpigmentation: Secondary | ICD-10-CM | POA: Diagnosis not present

## 2019-11-27 DIAGNOSIS — Z85820 Personal history of malignant melanoma of skin: Secondary | ICD-10-CM | POA: Diagnosis not present

## 2019-11-27 MED ORDER — RYBELSUS 7 MG PO TABS: 7.0000 mg | ORAL_TABLET | Freq: Every day | ORAL | 1 refills | Status: DC

## 2019-11-27 MED ORDER — HYDROCODONE-ACETAMINOPHEN 5-325 MG PO TABS: 1.0000 | ORAL_TABLET | Freq: Four times a day (QID) | ORAL | 0 refills | Status: DC | PRN

## 2019-11-27 NOTE — Telephone Encounter (Signed)
Has anyone called pt?  I have MRI approved. Patient is also scheduled for OV tomorrow morning at 9 w/ Dr T

## 2019-11-27 NOTE — Telephone Encounter (Signed)
Patient advised.

## 2019-11-27 NOTE — Telephone Encounter (Signed)
Pt left vm this morning stating that her left hip is hurting her really bad again and that she can "barely walk".  Her last hip injection was only 2 months ago.  She also stated that the Hydrocodone she has from Dr. Sheppard Coil doesn't really help with the pain. Please advise.

## 2019-11-27 NOTE — Telephone Encounter (Signed)
I do not think anyone has called this patient, she can definitely be scheduled for the MRI tomorrow, lets put her in the slot first thing in the morning so we can have the results when I see her.

## 2019-11-27 NOTE — Telephone Encounter (Signed)
Sent all med as requested!

## 2019-11-27 NOTE — Telephone Encounter (Signed)
It is most likely hip osteoarthritis, her last x-ray was over a year ago, proceeding with another x-ray and I would like her to go ahead and get her MRI today as well.

## 2019-11-27 NOTE — Assessment & Plan Note (Signed)
Persistent pain, injection 2 months ago, none updated x-rays, MRI today to determine if this is simply osteoarthritis or if there is a component of avascular necrosis.

## 2019-11-28 ENCOUNTER — Ambulatory Visit (INDEPENDENT_AMBULATORY_CARE_PROVIDER_SITE_OTHER): Payer: BC Managed Care – PPO | Admitting: Sports Medicine

## 2019-11-28 ENCOUNTER — Encounter: Payer: Self-pay | Admitting: Sports Medicine

## 2019-11-28 DIAGNOSIS — M1612 Unilateral primary osteoarthritis, left hip: Secondary | ICD-10-CM | POA: Diagnosis not present

## 2019-11-28 MED ORDER — HYDROCODONE-ACETAMINOPHEN 10-325 MG PO TABS: 1.0000 | ORAL_TABLET | Freq: Three times a day (TID) | ORAL | 0 refills | Status: DC | PRN

## 2019-11-28 NOTE — Progress Notes (Signed)
Subjective:    CC: Follow-up  HPI: This is a pleasant 68 year old female, she has left hip osteoarthritis, ultimately imaging has confirmed this, injections have not been effective so she understands she is now a candidate for total hip arthroplasty.  I reviewed the past medical history, family history, social history, surgical history, and allergies today and no changes were needed.  Please see the problem list section below in epic for further details.  Past Medical History: Past Medical History:  Diagnosis Date  . Abnormal mammogram of right breast 01/20/2018  . Anxiety   . Benign cyst of right breast 01/26/2018  . Blood transfusion without reported diagnosis   . Cataract   . Centrilobular emphysema ([place]) 07/25/2018  . Chronic kidney disease, stage 3a 12/31/2017  . Chronic low back pain   . Colon polyps   . Depression   . Diabetes mellitus without complication ([place])   . Emphysema of lung ([place])   . Fatty liver   . Hypertension   . Melanoma ([place])    R upper extremity  . Melanoma in situ of right upper extremity ([place])   . Mild diastolic dysfunction 0000000  . Osteoarthritis of lumbar spine   . Osteoporosis   . Renal cancer ([place])   . Skin cancer   . Sleep apnea 2008   cpap nightly  . Sphincter of Oddi dysfunction    bile duct   Past Surgical History: Past Surgical History:  Procedure Laterality Date  . ABDOMINAL HYSTERECTOMY    . ANKLE SURGERY Left    in her 82s  . BILE DUCT STENT PLACEMENT    . CHOLECYSTECTOMY    . COLONOSCOPY    . LEFT HEART CATH AND CORONARY ANGIOGRAPHY N/A 09/05/2019   Procedure: LEFT HEART CATH AND CORONARY ANGIOGRAPHY;  Surgeon: Leonie Man, MD;  Location: [facility] LAB;  Service: Cardiovascular;  Laterality: N/A;  . LUMBAR FUSION    . MOHS SURGERY     x 11  . PARTIAL NEPHRECTOMY    . POLYPECTOMY    . UPPER GASTROINTESTINAL ENDOSCOPY     Social History: Social History   Socioeconomic History  . Marital status: Married   Spouse name: Not on file  . Number of children: Not on file  . Years of education: Not on file  . Highest education level: Not on file  Occupational History  . Not on file  Social Needs  . Financial resource strain: Not on file  . Food insecurity    Worry: Not on file    Inability: Not on file  . Transportation needs    Medical: Not on file    Non-medical: Not on file  Tobacco Use  . Smoking status: Former Smoker    Packs/day: 1.00    Years: 40.00    Pack years: 40.00    Types: Cigarettes    Quit date: 12/21/2010    Years since quitting: 8.9  . Smokeless tobacco: Never Used  Substance and Sexual Activity  . Alcohol use: Yes    Alcohol/week: 1.0 - 2.0 standard drinks    Types: 1 - 2 Standard drinks or equivalent per week  . Drug use: No  . Sexual activity: Yes    Birth control/protection: Surgical, None  Lifestyle  . Physical activity    Days per week: Not on file    Minutes per session: Not on file  . Stress: Not on file  Relationships  . Social Herbalist on phone: Not  on file    Gets together: Not on file    Attends religious service: Not on file    Active member of club or organization: Not on file    Attends meetings of clubs or organizations: Not on file    Relationship status: Not on file  Other Topics Concern  . Not on file  Social History Narrative  . Not on file   Family History: Family History  Problem Relation Age of Onset  . Depression Mother   . Hyperlipidemia Mother   . Hypertension Mother   . Colon polyps Mother   . Heart attack Father   . Hyperlipidemia Father   . Hypertension Father   . Colon polyps Father   . Alcohol abuse Sister   . Depression Sister   . Alcohol abuse Son   . Alcohol abuse Son   . Alcohol abuse Sister   . Depression Sister   . Stomach cancer Brother   . Colon cancer Neg Hx   . Esophageal cancer Neg Hx   . Rectal cancer Neg Hx    Allergies: Allergies  Allergen Reactions  . Latex Itching and Rash  .  Metformin And Related Other (See Comments)    Malaise, fatigue  . Morphine And Related Rash and Other (See Comments)    Agitation  . Tape Swelling and Other (See Comments)    Burning sensation  . Tramadol Palpitations    Heart fluttering   Medications: See med rec.  Review of Systems: No fevers, chills, night sweats, weight loss, chest pain, or shortness of breath.   Objective:    General: Well Developed, well nourished, and in no acute distress.  Neuro: Alert and oriented x3, extra-ocular muscles intact, sensation grossly intact.  HEENT: Normocephalic, atraumatic, pupils equal round reactive to light, neck supple, no masses, no lymphadenopathy, thyroid nonpalpable.  Skin: Warm and dry, no rashes. Cardiac: Regular rate and rhythm, no murmurs rubs or gallops, no lower extremity edema.  Respiratory: Clear to auscultation bilaterally. Not using accessory muscles, speaking in full sentences.  Impression and Recommendations:    Primary osteoarthritis of left hip Unfortunately the injection only lasted a couple of months, x-rays and MRI confirmed severe osteoarthritis. No evidence of AVN. At this point Seira understands that she is now a candidate for total hip arthroplasty, referral to Dr. Berenice Primas, refilling hydrocodone with a slightly higher dose. She hopes to get this done before the end of the year.   ___________________________________________ Gwen Her. Dianah Field, M.D., ABFM., CAQSM. Primary Care and Sports Medicine [facility]  Adjunct Professor of [employer]

## 2019-11-28 NOTE — Assessment & Plan Note (Signed)
Unfortunately the injection only lasted a couple of months, x-rays and MRI confirmed severe osteoarthritis. No evidence of AVN. At this point Bonnie [name] understands that she is now a candidate for total hip arthroplasty, referral to Bonnie [name], refilling hydrocodone with a slightly higher dose. She hopes to get this done before the end of the year.

## 2019-11-28 NOTE — Telephone Encounter (Signed)
Completed.

## 2019-11-29 DIAGNOSIS — G4733 Obstructive sleep apnea (adult) (pediatric): Secondary | ICD-10-CM | POA: Diagnosis not present

## 2019-12-06 ENCOUNTER — Other Ambulatory Visit: Payer: Self-pay | Admitting: Gastroenterology

## 2019-12-06 ENCOUNTER — Ambulatory Visit (INDEPENDENT_AMBULATORY_CARE_PROVIDER_SITE_OTHER): Payer: BC Managed Care – PPO

## 2019-12-06 DIAGNOSIS — Z1159 Encounter for screening for other viral diseases: Secondary | ICD-10-CM

## 2019-12-07 DIAGNOSIS — M1612 Unilateral primary osteoarthritis, left hip: Secondary | ICD-10-CM | POA: Diagnosis not present

## 2019-12-07 LAB — SARS CORONAVIRUS 2 (TAT 6-24 HRS): SARS Coronavirus 2: NEGATIVE

## 2019-12-08 ENCOUNTER — Ambulatory Visit (AMBULATORY_SURGERY_CENTER): Payer: BC Managed Care – PPO | Admitting: Gastroenterology

## 2019-12-08 ENCOUNTER — Other Ambulatory Visit: Payer: Self-pay | Admitting: Gastroenterology

## 2019-12-08 ENCOUNTER — Other Ambulatory Visit: Payer: Self-pay

## 2019-12-08 ENCOUNTER — Encounter: Payer: Self-pay | Admitting: Gastroenterology

## 2019-12-08 VITALS — BP 137/75 | HR 76 | Temp 97.5°F | Resp 20 | Ht 69.5 in | Wt 301.4 lb

## 2019-12-08 DIAGNOSIS — D123 Benign neoplasm of transverse colon: Secondary | ICD-10-CM

## 2019-12-08 DIAGNOSIS — D120 Benign neoplasm of cecum: Secondary | ICD-10-CM | POA: Diagnosis not present

## 2019-12-08 DIAGNOSIS — Z1211 Encounter for screening for malignant neoplasm of colon: Secondary | ICD-10-CM | POA: Diagnosis not present

## 2019-12-08 DIAGNOSIS — K5520 Angiodysplasia of colon without hemorrhage: Secondary | ICD-10-CM

## 2019-12-08 DIAGNOSIS — Z86010 Personal history of colonic polyps: Secondary | ICD-10-CM

## 2019-12-08 DIAGNOSIS — R1010 Upper abdominal pain, unspecified: Secondary | ICD-10-CM

## 2019-12-08 MED ORDER — SODIUM CHLORIDE 0.9 % IV SOLN: 500.0000 mL | Freq: Once | INTRAVENOUS | Status: DC

## 2019-12-08 NOTE — Progress Notes (Signed)
Called to room to assist during endoscopic procedure.  Patient ID and intended procedure confirmed with present staff. Received instructions for my participation in the procedure from the performing physician.  

## 2019-12-08 NOTE — Patient Instructions (Signed)
6 polyps removed today.  You will receive a letter in the mail from Dr Havery Moros in 1-3 weeks with the pathology report. Diverticulosis and hemorrhoids were noted.      YOU HAD AN ENDOSCOPIC PROCEDURE TODAY AT [facility]:   Refer to the procedure report that was given to you for any specific questions about what was found during the examination.  If the procedure report does not answer your questions, please call your gastroenterologist to clarify.  If you requested that your care partner not be given the details of your procedure findings, then the procedure report has been included in a sealed envelope for you to review at your convenience later.  YOU SHOULD EXPECT: Some feelings of bloating in the abdomen. Passage of more gas than usual.  Walking can help get rid of the air that was put into your GI tract during the procedure and reduce the bloating. If you had a lower endoscopy (such as a colonoscopy or flexible sigmoidoscopy) you may notice spotting of blood in your stool or on the toilet paper. If you underwent a bowel prep for your procedure, you may not have a normal bowel movement for a few days.  Please Note:  You might notice some irritation and congestion in your nose or some drainage.  This is from the oxygen used during your procedure.  There is no need for concern and it should clear up in a day or so.  SYMPTOMS TO REPORT IMMEDIATELY:   Following lower endoscopy (colonoscopy or flexible sigmoidoscopy):  Excessive amounts of blood in the stool  Significant tenderness or worsening of abdominal pains  Swelling of the abdomen that is new, acute  Fever of 100F or higher   For urgent or emergent issues, a gastroenterologist can be reached at any hour by calling [phone].   DIET:  We do recommend a small meal at first, but then you may proceed to your regular diet.  Drink plenty of fluids but you should avoid alcoholic beverages for 24 hours.  ACTIVITY:   You should plan to take it easy for the rest of today and you should NOT DRIVE or use heavy machinery until tomorrow (because of the sedation medicines used during the test).    FOLLOW UP: Our staff will call the number listed on your records 48-72 hours following your procedure to check on you and address any questions or concerns that you may have regarding the information given to you following your procedure. If we do not reach you, we will leave a message.  We will attempt to reach you two times.  During this call, we will ask if you have developed any symptoms of COVID 19. If you develop any symptoms (ie: fever, flu-like symptoms, shortness of breath, cough etc.) before then, please call [phone].  If you test positive for Covid 19 in the 2 weeks post procedure, please call and report this information to [place].    If any biopsies were taken you will be contacted by phone or by letter within the next 1-3 weeks.  Please call us at [phone] if you have not heard about the biopsies in 3 weeks.    SIGNATURES/CONFIDENTIALITY: You and/or your care partner have signed paperwork which will be entered into your electronic medical record.  These signatures attest to the fact that that the information above on your After Visit Summary has been reviewed and is understood.  Full responsibility of the confidentiality of this discharge information  lies with you and/or your care-partner.

## 2019-12-08 NOTE — Progress Notes (Signed)
VS-[id number] [id number]  Pt's states no medical or surgical changes since previsit or office visit.

## 2019-12-08 NOTE — Progress Notes (Signed)
To PACU, VSS. Report to [email or web address] 

## 2019-12-08 NOTE — Op Note (Signed)
[facility] Patient Name: Bonnie [name] Procedure Date: 12/08/2019 7:31 AM MRN: [id number] Endoscopist: Remo Lipps P. Havery Moros , MD Age: 68 Referring MD:  Date of Birth: Jan 17, 1951 Gender: Female Account #: [id number] Procedure:                Colonoscopy Indications:              High risk colon cancer surveillance: Personal                            history of sessile serrated colon polyp (less than                            10 mm in size) with no dysplasia 5 years ago Medicines:                Monitored Anesthesia Care Procedure:                Pre-Anesthesia Assessment:                           - Prior to the procedure, a History and Physical                            was performed, and patient medications and                            allergies were reviewed. The patient's tolerance of                            previous anesthesia was also reviewed. The risks                            and benefits of the procedure and the sedation                            options and risks were discussed with the patient.                            All questions were answered, and informed consent                            was obtained. Prior Anticoagulants: The patient has                            taken no previous anticoagulant or antiplatelet                            agents. ASA Grade Assessment: III - A patient with                            severe systemic disease. After reviewing the risks                            and benefits, the patient was deemed in  satisfactory condition to undergo the procedure.                           After obtaining informed consent, the colonoscope                            was passed under direct vision. Throughout the                            procedure, the patient's blood pressure, pulse, and                            oxygen saturations were monitored continuously. The   Colonoscope was introduced through the anus and                            advanced to the the cecum, identified by                            appendiceal orifice and ileocecal valve. The                            colonoscopy was performed without difficulty. The                            patient tolerated the procedure well. The quality                            of the bowel preparation was good. The ileocecal                            valve, appendiceal orifice, and rectum were                            photographed. Scope In: 8:05:53 AM Scope Out: 8:26:00 AM Total Procedure Duration: 0 hours 20 minutes 7 seconds  Findings:                 The perianal and digital rectal examinations were                            normal.                           A single medium-sized angiodysplastic lesion was                            found in the cecum.                           Two sessile polyps were found in the cecum. The                            polyps were diminutive in size. These polyps were  removed with a cold snare. Resection and retrieval                            were complete.                           Four sessile polyps were found in the transverse                            colon. The polyps were 2 to 6 mm in size. These                            polyps were removed with a cold snare. Resection                            and retrieval were complete.                           A few diverticula were found in the sigmoid colon                            and ascending colon.                           Internal hemorrhoids were found during retroflexion.                           The exam was otherwise without abnormality. Complications:            No immediate complications. Estimated blood loss:                            Minimal. Estimated Blood Loss:     Estimated blood loss was minimal. Impression:               - A single colonic  angiodysplastic lesion.                           - Two diminutive polyps in the cecum, removed with                            a cold snare. Resected and retrieved.                           - Four 2 to 6 mm polyps in the transverse colon,                            removed with a cold snare. Resected and retrieved.                           - Diverticulosis in the ascending colon.                           - Internal hemorrhoids.                           -  The examination was otherwise normal. Recommendation:           - Patient has a contact number available for                            emergencies. The signs and symptoms of potential                            delayed complications were discussed with the                            patient. Return to normal activities tomorrow.                            Written discharge instructions were provided to the                            patient.                           - Resume previous diet.                           - Continue present medications.                           - Await pathology results. Remo Lipps P. Nur Krasinski, MD 12/08/2019 8:34:02 AM This report has been signed electronically.

## 2019-12-12 ENCOUNTER — Telehealth: Payer: Self-pay | Admitting: *Deleted

## 2019-12-12 NOTE — Telephone Encounter (Signed)
Entered chart in error.

## 2019-12-12 NOTE — Telephone Encounter (Signed)
  Follow up Call-  Call back number 12/08/2019  Post procedure Call Back phone  # [phone]  Permission to leave phone message Yes  Some recent data might be hidden     Patient questions:  Do you have a fever, pain , or abdominal swelling? No. Pain Score  0 *  Have you tolerated food without any problems? Yes.    Have you been able to return to your normal activities? Yes.    Do you have any questions about your discharge instructions: Diet   No. Medications  No. Follow up visit  No.  Do you have questions or concerns about your Care? No.  Actions: * If pain score is 4 or above: 1. No action needed, pain <4.Have you developed a fever since your procedure? no  2.   Have you had an respiratory symptoms (SOB or cough) since your procedure? no  3.   Have you tested positive for COVID 19 since your procedure no  4.   Have you had any family members/close contacts diagnosed with the COVID 19 since your procedure?  no   If yes to any of these questions please route to Joylene John, RN and Alphonsa Gin, Therapist, sports.

## 2019-12-23 DIAGNOSIS — G4733 Obstructive sleep apnea (adult) (pediatric): Secondary | ICD-10-CM | POA: Diagnosis not present

## 2020-01-04 DIAGNOSIS — M1612 Unilateral primary osteoarthritis, left hip: Secondary | ICD-10-CM | POA: Diagnosis not present

## 2020-01-22 ENCOUNTER — Other Ambulatory Visit: Payer: Self-pay | Admitting: Physician Assistant

## 2020-01-22 DIAGNOSIS — E1165 Type 2 diabetes mellitus with hyperglycemia: Secondary | ICD-10-CM

## 2020-01-22 DIAGNOSIS — E1169 Type 2 diabetes mellitus with other specified complication: Secondary | ICD-10-CM

## 2020-01-22 DIAGNOSIS — I10 Essential (primary) hypertension: Secondary | ICD-10-CM

## 2020-01-23 DIAGNOSIS — G4733 Obstructive sleep apnea (adult) (pediatric): Secondary | ICD-10-CM | POA: Diagnosis not present

## 2020-01-25 ENCOUNTER — Encounter: Payer: Self-pay | Admitting: Osteopathic Medicine

## 2020-01-25 DIAGNOSIS — E1165 Type 2 diabetes mellitus with hyperglycemia: Secondary | ICD-10-CM

## 2020-01-25 DIAGNOSIS — E1169 Type 2 diabetes mellitus with other specified complication: Secondary | ICD-10-CM

## 2020-01-25 DIAGNOSIS — E785 Hyperlipidemia, unspecified: Secondary | ICD-10-CM

## 2020-01-25 MED ORDER — ATORVASTATIN CALCIUM 40 MG PO TABS: 40.0000 mg | ORAL_TABLET | Freq: Every day | ORAL | 3 refills | Status: DC

## 2020-02-01 ENCOUNTER — Other Ambulatory Visit: Payer: Self-pay

## 2020-02-01 ENCOUNTER — Encounter: Payer: Self-pay | Admitting: Osteopathic Medicine

## 2020-02-01 ENCOUNTER — Ambulatory Visit: Payer: BC Managed Care – PPO | Admitting: Osteopathic Medicine

## 2020-02-01 VITALS — BP 119/75 | HR 79 | Temp 97.4°F | Wt 288.0 lb

## 2020-02-01 DIAGNOSIS — E1165 Type 2 diabetes mellitus with hyperglycemia: Secondary | ICD-10-CM

## 2020-02-01 DIAGNOSIS — M1612 Unilateral primary osteoarthritis, left hip: Secondary | ICD-10-CM | POA: Diagnosis not present

## 2020-02-01 DIAGNOSIS — Z79899 Other long term (current) drug therapy: Secondary | ICD-10-CM | POA: Diagnosis not present

## 2020-02-01 DIAGNOSIS — M170 Bilateral primary osteoarthritis of knee: Secondary | ICD-10-CM | POA: Diagnosis not present

## 2020-02-01 LAB — POCT GLYCOSYLATED HEMOGLOBIN (HGB A1C): Hemoglobin A1C: 6 % — AB (ref 4.0–5.6)

## 2020-02-01 MED ORDER — HYDROCODONE-ACETAMINOPHEN 5-325 MG PO TABS: 1.0000 | ORAL_TABLET | Freq: Three times a day (TID) | ORAL | 0 refills | Status: DC | PRN

## 2020-02-01 NOTE — Progress Notes (Signed)
Bonnie [name] is a 69 y.o. female who presents to  [facility]  today, 02/01/20, seeking care for the following:  The primary encounter diagnosis was Type 2 diabetes mellitus with hyperglycemia, without long-term current use of insulin ([place]). Diagnoses of Primary osteoarthritis of both knees, Primary osteoarthritis of left hip, and Controlled substance agreement signed were also pertinent to this visit.    ASSESSMENT & PLAN with other pertinent history/findings:  1. Type 2 diabetes mellitus with hyperglycemia, without long-term current use of insulin ([place]) Results for orders placed or performed in visit on 02/01/20 (from the past 24 hour(s))  POCT HgB A1C     Status: Abnormal   Collection Time: 02/01/20 11:25 AM  Result Value Ref Range   Hemoglobin A1C 6.0 (A) 4.0 - 5.6 %   HbA1c POC (<> result, manual entry)     HbA1c, POC (prediabetic range)     HbA1c, POC (controlled diabetic range)      2. Primary osteoarthritis of both knees Following w/ Dr T  3. Primary osteoarthritis of left hip replacement pending weight loss  Continue meds  PDMP reviewed during this encounter.  4. Controlled substance agreement signed Refilled meds PDMP reviewed during this encounter.  5. Obesity Working on weight loss  OK to increase Rybelsus     Patient Instructions  Will try increase Rybelsus form 7 mg to 14 mg  Message me and let me know how that's doing for you!   Meds ordered this encounter  Medications  . HYDROcodone-acetaminophen (NORCO/VICODIN) 5-325 MG tablet    Sig: Take 1 tablet by mouth every 8 (eight) hours as needed for moderate pain or severe pain.    Dispense:  30 tablet    Refill:  0      Follow-up instructions: Return in about 3 months (around 04/30/2020) for recheck A1C and maintain pain Rx - see me sooner if needed.                         BP 119/75 (BP Location: Right Arm, Patient  Position: Sitting, Cuff Size: Large)   Pulse 79   Temp (!) 97.4 F (36.3 C) (Oral)   Wt 288 lb (130.6 kg)   BMI 41.92 kg/m   Current Meds  Medication Sig  . acetaminophen (TYLENOL) 650 MG CR tablet Take 1 tablet (650 mg total) by mouth every 8 (eight) hours. MAX 4G/DAY (Patient taking differently: Take 650 mg by mouth every 8 (eight) hours as needed for pain. MAX 4G/DAY)  . allopurinol (ZYLOPRIM) 100 MG tablet TAKE 1 TABLET DAILY  . ALPRAZolam (XANAX) 0.25 MG tablet Take 1-2 tablets (0.25-0.5 mg total) by mouth at bedtime as needed for anxiety or sleep. DO NOT COMBINE WITH HYDROCODONE/NORCO  . aspirin 81 MG tablet Take 81 mg by mouth daily.   Marland Kitchen atorvastatin (LIPITOR) 40 MG tablet Take 1 tablet (40 mg total) by mouth daily.  . blood glucose meter kit and supplies KIT Check morning fasting blood glucose and up to four times daily as directed.  . Cholecalciferol (VITAMIN D3) 50 MCG (2000 UT) TABS Take 2,000 Units by mouth daily.  . enalapril (VASOTEC) 10 MG tablet Take 1 tablet (10 mg total) by mouth daily.  . fluticasone (FLONASE) 50 MCG/ACT nasal spray   . HYDROcodone-acetaminophen (NORCO) 10-325 MG tablet Take 1 tablet by mouth every 8 (eight) hours as needed.  . Multiple Vitamin tablet Take 1 tablet by  mouth daily.   . Omega-3 Fatty Acids (FISH OIL) 1000 MG CAPS Take 1,000 mg by mouth daily.   . ONE TOUCH ULTRA TEST test strip   . ONETOUCH DELICA LANCETS 29J MISC   . Semaglutide (RYBELSUS) 7 MG TABS Take 7 mg by mouth daily.  . traZODone (DESYREL) 50 MG tablet Take 1 tablet (50 mg total) by mouth at bedtime as needed.  . triamterene-hydrochlorothiazide (MAXZIDE-25) 37.5-25 MG tablet Take 1 tablet by mouth daily.  Marland Kitchen venlafaxine XR (EFFEXOR-XR) 75 MG 24 hr capsule Take 1 capsule (75 mg total) by mouth daily with breakfast.    Results for orders placed or performed in visit on 02/01/20 (from the past 72 hour(s))  POCT HgB A1C     Status: Abnormal   Collection Time: 02/01/20 11:25 AM   Result Value Ref Range   Hemoglobin A1C 6.0 (A) 4.0 - 5.6 %   HbA1c POC (<> result, manual entry)     HbA1c, POC (prediabetic range)     HbA1c, POC (controlled diabetic range)      No results found.  Depression screen [facility] 2/9 10/10/2019 08/24/2019 01/17/2019  Decreased Interest 0 1 0  Down, Depressed, Hopeless 0 1 0  PHQ - 2 Score 0 2 0  Altered sleeping '1 3 1  ' Tired, decreased energy 0 1 0  Change in appetite '2 1 1  ' Feeling bad or failure about yourself  0 0 0  Trouble concentrating 0 1 0  Moving slowly or fidgety/restless 0 0 0  Suicidal thoughts 0 0 0  PHQ-9 Score '3 8 2  ' Difficult doing work/chores - - Not difficult at all    GAD 7 : Generalized Anxiety Score 10/10/2019 08/24/2019 01/17/2019 10/06/2018  Nervous, Anxious, on Edge 0 1 0 0  Control/stop worrying 0 1 0 1  Worry too much - different things 0 1 0 0  Trouble relaxing 1 1 0 0  Restless 0 0 0 0  Easily annoyed or irritable 1 0 0 0  Afraid - awful might happen 0 0 0 0  Total GAD 7 Score 2 4 0 1      All questions at time of visit were answered - patient instructed to contact office with any additional concerns or updates.  ER/RTC precautions were reviewed with the patient.  Please note: voice recognition software was used to produce this document, and typos may escape review. Please contact Dr. Sheppard Coil for any needed clarifications.

## 2020-02-01 NOTE — Patient Instructions (Signed)
Will try increase Rybelsus form 7 mg to 14 mg  Message me and let me know how that's doing for you!

## 2020-02-03 ENCOUNTER — Encounter: Payer: Self-pay | Admitting: Osteopathic Medicine

## 2020-02-03 DIAGNOSIS — I10 Essential (primary) hypertension: Secondary | ICD-10-CM

## 2020-02-03 DIAGNOSIS — F3342 Major depressive disorder, recurrent, in full remission: Secondary | ICD-10-CM

## 2020-02-05 MED ORDER — ENALAPRIL MALEATE 10 MG PO TABS: 10.0000 mg | ORAL_TABLET | Freq: Every day | ORAL | 3 refills | Status: DC

## 2020-02-05 MED ORDER — TRAZODONE HCL 50 MG PO TABS: 50.0000 mg | ORAL_TABLET | Freq: Every evening | ORAL | 3 refills | Status: DC | PRN

## 2020-02-05 NOTE — Telephone Encounter (Signed)
Meds pended, please send if OK

## 2020-02-06 ENCOUNTER — Other Ambulatory Visit: Payer: Self-pay

## 2020-02-06 ENCOUNTER — Encounter: Payer: Self-pay | Admitting: Orthopaedic Surgery

## 2020-02-06 ENCOUNTER — Ambulatory Visit: Payer: BC Managed Care – PPO | Admitting: Orthopaedic Surgery

## 2020-02-06 VITALS — Ht 68.75 in | Wt 286.0 lb

## 2020-02-06 DIAGNOSIS — M1612 Unilateral primary osteoarthritis, left hip: Secondary | ICD-10-CM | POA: Diagnosis not present

## 2020-02-06 NOTE — Progress Notes (Signed)
Office Visit Note   Patient: Bonnie [name]           Date of Birth: Jul 21, 1951           MRN: [id number] Visit Date: 02/06/2020              Requested by: Emeterio Reeve, [address] [place],  [place] [id number] PCP: Emeterio Reeve, DO   Assessment & Plan: Visit Diagnoses:  1. Primary osteoarthritis of left hip     Plan: Due to the fact the patient has failed conservative treatment for her left hip arthritis .conservative measures have included assistive devices i.e. walker and cane, medications, and intra-articular injection of the left hip recommend surgical intervention.  Surgical intervention will be a left total hip arthroplasty.  Patient was examined by Dr. Ninfa Linden and felt even given her BMI of 42 that surgery could be done safely.  Risk benefits of surgery discussed with patient and include but are not limited to DVT/PE, wound healing problems, prolonged pain, worsening pain, infection, nerve and vessel injury.  Patient would like to proceed with surgery in the near future.  Russians were encouraged and answered at length by Dr. Ninfa Linden and myself.  Pamphlet on anterior hip arthroplasty was given to the patient.  Hip model was shown to the patient including arthroplasty components. Radiographs on epic are reviewed AP pelvis lateral view of the left hip shows mild to moderate changes of the right hip.  Near bone-on-bone with osteophyte changes about the left femoral head.  No acute fractures.  Both hips are well located.  No bony abnormalities otherwise.  Follow-Up Instructions: Return 2 weeks post-op.   Orders:  No orders of the defined types were placed in this encounter.  No orders of the defined types were placed in this encounter.     Procedures: No procedures performed   Clinical Data: No additional findings.   Subjective: Chief Complaint  Patient presents with  . Left Hip - Pain    HPI Bonnie [name] is a 69 year old female who comes in  today with left hip pain has been ongoing for greater than a year.  She is ambulating with a cane.  She is tried intra-articular injections in the hip.  Has been told that she has bone-on-bone arthritis in the hip.  She is also taking pain medications.  She having severe pain in her groin that is greatly affecting her activities of daily living.  She has difficulty donning shoes socks.  She has difficulty getting up from a seated position due to left hip pain.  Patient is diabetic with good control of her diabetes with last hemoglobin A1c 6.0.  She denies any history of DVT PE.  Had recent cardiac work-up that included a left heart catheterization and angiography that showed mild disease in her LAD otherwise angiographically normal coronary arteries.  Review of Systems Negative for fevers chills shortness of breath chest pain.  Objective: Vital Signs: Ht 5' 8.75" (1.746 m)   Wt 286 lb (129.7 kg)   BMI 42.54 kg/m   Physical Exam Constitutional:      Appearance: She is obese. She is not ill-appearing or diaphoretic.  Pulmonary:     Effort: Pulmonary effort is normal.  Neurological:     Mental Status: She is alert and oriented to person, place, and time.     Ortho Exam Right hip excellent range of motion without pain.  Left hip limited range of motion due to pain with  internal/external rotation.  Calves are supple nontender bilaterally dorsiflexion plantarflexion bilateral ankles intact.   Specialty Comments:  No specialty comments available.  Imaging: No results found.   PMFS History: Patient Active Problem List   Diagnosis Date Noted  . Acute kidney injury superimposed on CKD ([place]) 09/17/2019  . Insomnia secondary to anxiety 09/17/2019  . Increase in serum creatinine from prior measurement 09/17/2019  . Abnormal nuclear stress test 09/04/2019  . Mild diastolic dysfunction [id number]  . Right bundle branch block 07/05/2019  . Left axis deviation 07/05/2019  . Decreased exercise  tolerance 07/05/2019  . Sphincter of Oddi dysfunction 07/05/2019  . Multiple pulmonary nodules determined by computed tomography of lung 07/05/2019  . Abnormal QT interval present on electrocardiogram 07/05/2019  . Irregular heart beats 06/29/2019  . Family history of CHF (congestive heart failure) 06/29/2019  . Recurrent major depressive disorder, in partial remission ([place]) 06/12/2019  . Encounter for diabetic foot exam ([facility]) 06/12/2019  . Grief reaction 05/18/2019  . History of colon polyps 01/17/2019  . Encounter for long-term (current) use of high-risk medication 01/17/2019  . Primary osteoarthritis of left hip 12/19/2018  . Left hip pain 10/13/2018  . Centrilobular emphysema ([place]) 07/25/2018  . Closed compression fracture of body of L1 vertebra ([place]) 07/25/2018  . Renal cyst, acquired, left 07/25/2018  . Primary osteoarthritis involving multiple joints 07/06/2018  . Diabetic eye exam ([place]) 07/06/2018  . History of multiple pulmonary nodules 07/06/2018  . Encounter for screening for lung cancer 07/06/2018  . Type 2 diabetes mellitus with hyperglycemia, without long-term current use of insulin ([place]) 04/04/2018  . Dyslipidemia associated with type 2 diabetes mellitus ([place]) 04/04/2018  . Class 3 severe obesity due to excess calories with serious comorbidity and body mass index (BMI) of 40.0 to 44.9 in adult ([place]) 04/03/2018  . Prediabetes 04/03/2018  . Encounter for weight loss counseling 04/03/2018  . Onychomycosis 03/28/2018  . Morbid obesity ([place]) 02/28/2018  . Bilateral foot pain 02/28/2018  . Benign cyst of right breast 01/26/2018  . Abnormal mammogram of right breast 01/20/2018  . Chronic kidney disease, stage 3a 12/31/2017  . Encounter for chronic pain management 12/30/2017  . Chronic use of opiate for therapeutic purpose 12/30/2017  . History of melanoma 12/30/2017  . History of renal cell carcinoma 12/30/2017  . Arthritis of carpometacarpal [facility]) joint of left thumb  05/19/2017  . Controlled substance agreement signed 03/30/2017  . H/O acute pancreatitis 07/17/2016  . History of biliary stent insertion 07/17/2016  . Coronary artery calcification seen on CT scan 11/08/2015  . History of back surgery 09/23/2015  . Spondylosis of lumbar region without myelopathy or radiculopathy 08/30/2015  . Primary osteoarthritis of both knees 03/01/2015  . Cellulitis of face 02/19/2015  . Anxiety 07/24/2014  . Basal cell carcinoma 10/04/2013  . Depression, major 10/04/2013  . Squamous cell carcinoma 10/04/2013  . Dyspnea on exertion -> considered to be possible anginal equivalent 04/20/2012  . Essential hypertension 03/29/2012  . Former smoker 03/29/2012  . Gout 03/29/2012  . Hyperlipidemia 03/29/2012  . Palpitations 03/29/2012  . OSA on CPAP 03/29/2012   Past Medical History:  Diagnosis Date  . Abnormal mammogram of right breast 01/20/2018  . Anxiety   . Benign cyst of right breast 01/26/2018  . Blood transfusion without reported diagnosis   . Cataract   . Centrilobular emphysema ([place]) 07/25/2018  . Chronic kidney disease, stage 3a 12/31/2017  . Chronic low back pain   . Colon polyps   .  Depression   . Diabetes mellitus without complication ([place])   . Emphysema of lung ([place])   . Fatty liver   . Hypertension   . Melanoma ([place])    R upper extremity  . Melanoma in situ of right upper extremity ([place])   . Mild diastolic dysfunction 0000000  . Osteoarthritis of lumbar spine   . Osteoporosis   . Renal cancer ([place])   . Skin cancer   . Sleep apnea 2008   cpap nightly  . Sphincter of Oddi dysfunction    bile duct    Family History  Problem Relation Age of Onset  . Depression Mother   . Hyperlipidemia Mother   . Hypertension Mother   . Colon polyps Mother   . Heart attack Father   . Hyperlipidemia Father   . Hypertension Father   . Colon polyps Father   . Alcohol abuse Sister   . Depression Sister   . Alcohol abuse Son   . Alcohol abuse Son   .  Alcohol abuse Sister   . Depression Sister   . Stomach cancer Brother   . Colon cancer Neg Hx   . Esophageal cancer Neg Hx   . Rectal cancer Neg Hx     Past Surgical History:  Procedure Laterality Date  . ABDOMINAL HYSTERECTOMY    . ANKLE SURGERY Left    in her 8s  . BILE DUCT STENT PLACEMENT    . CHOLECYSTECTOMY    . COLONOSCOPY    . LEFT HEART CATH AND CORONARY ANGIOGRAPHY N/A 09/05/2019   Procedure: LEFT HEART CATH AND CORONARY ANGIOGRAPHY;  Surgeon: Leonie Man, MD;  Location: [facility];  Service: Cardiovascular;  Laterality: N/A;  . LUMBAR FUSION    . MOHS SURGERY     x 11  . PARTIAL NEPHRECTOMY    . POLYPECTOMY    . UPPER GASTROINTESTINAL ENDOSCOPY     Social History   Occupational History  . Not on file  Tobacco Use  . Smoking status: Former Smoker    Packs/day: 1.00    Years: 40.00    Pack years: 40.00    Types: Cigarettes    Quit date: 12/21/2010    Years since quitting: 9.1  . Smokeless tobacco: Never Used  Substance and Sexual Activity  . Alcohol use: Yes    Alcohol/week: 1.0 - 2.0 standard drinks    Types: 1 - 2 Standard drinks or equivalent per week  . Drug use: No  . Sexual activity: Yes    Birth control/protection: Surgical, None

## 2020-02-14 ENCOUNTER — Other Ambulatory Visit: Payer: Self-pay | Admitting: Physician Assistant

## 2020-02-15 NOTE — Patient Instructions (Signed)
DUE TO COVID-19 ONLY ONE VISITOR IS ALLOWED TO COME WITH YOU AND STAY IN THE WAITING ROOM ONLY DURING PRE OP AND PROCEDURE DAY OF SURGERY. THE 1 VISITOR MAY VISIT WITH YOU AFTER SURGERY IN YOUR PRIVATE ROOM DURING VISITING HOURS ONLY!  YOU NEED TO HAVE A COVID 19 TEST ON_3/2/21______ @_12 :00pm______, THIS TEST MUST BE DONE BEFORE SURGERY, COME  [address] , [id number].  ([place]) ONCE YOUR COVID TEST IS COMPLETED, PLEASE BEGIN THE QUARANTINE INSTRUCTIONS AS OUTLINED IN YOUR HANDOUT.                Tieler Lipnick    Your procedure is scheduled on: 02/23/20   Report to [facility] Main  Entrance   Report to Short Stay at 5:30  AM     Call this number if you have problems the morning of surgery [place], NO CHEWING GUM [place].   Do not eat food After Midnight.   YOU MAY HAVE CLEAR LIQUIDS FROM MIDNIGHT UNTIL 4:30AM.   At 4:30AM Please finish the prescribed Pre-Surgery Gatorade drink  . Nothing by mouth after you finish the Gatorade drink !   Take these medicines the morning of surgery with A SIP OF WATER: Wellbutrin, Venlafaxine, Allopurinol.   Bring your mask and tubing to the hospital with you  DO NOT TAKE ANY DIABETIC MEDICATIONS DAY OF YOUR SURGERY     How to Manage Your Diabetes Before and After Surgery  Why is it important to control my blood sugar before and after surgery? . Improving blood sugar levels before and after surgery helps healing and can limit problems. . A way of improving blood sugar control is eating a healthy diet by: o  Eating less sugar and carbohydrates o  Increasing activity/exercise o  Talking with your doctor about reaching your blood sugar goals . High blood sugars (greater than 180 mg/dL) can raise your risk of infections and slow your recovery, so you will need to focus on controlling your diabetes during the weeks before  surgery. . Make sure that the doctor who takes care of your diabetes knows about your planned surgery including the date and location.  How do I manage my blood sugar before surgery? . Check your blood sugar at least 4 times a day, starting 2 days before surgery, to make sure that the level is not too high or low. o Check your blood sugar the morning of your surgery when you wake up and every 2 hours until you get to the Short Stay unit. . If your blood sugar is less than 70 mg/dL, you will need to treat for low blood sugar: o Do not take insulin. o Treat a low blood sugar (less than 70 mg/dL) with  cup of clear juice (cranberry or apple), 4 glucose tablets, OR glucose gel. o Recheck blood sugar in 15 minutes after treatment (to make sure it is greater than 70 mg/dL). If your blood sugar is not greater than 70 mg/dL on recheck, call [phone] for further instructions. . Report your blood sugar to the short stay nurse when you get to Short Stay.  . If you are admitted to the hospital after surgery: o Your blood sugar will be checked by the staff and you will probably be given insulin after surgery (instead of oral diabetes medicines) to make sure you have good blood sugar levels. o  The goal for blood sugar control after surgery is 80-180 mg/dL.   WHAT DO I DO ABOUT MY DIABETES MEDICATION?  [place] Do not take oral diabetes medicines (pills) the morning of surgery.                             You may not have any metal on your body including hair pins and              piercings  Do not wear jewelry, make-up, lotions, powders or perfumes, deodorant             Do not wear nail polish on your fingernails.  Do not shave  48 hours prior to surgery.            Do not bring valuables to the hospital. [place].  Contacts, dentures or bridgework may not be worn into surgery.                Please read over the following fact sheets you were  given: _____________________________________________________________________             [facility] - Preparing for Surgery  Before surgery, you can play an important role.   Because skin is not sterile, your skin needs to be as free of germs as possible.   You can reduce the number of germs on your skin by washing with CHG (chlorahexidine gluconate) soap before surgery.   CHG is an antiseptic cleaner which kills germs and bonds with the skin to continue killing germs even after washing. Please DO NOT use if you have an allergy to CHG or antibacterial soaps.   If your skin becomes reddened/irritated stop using the CHG and inform your nurse when you arrive at Short Stay. Do not shave (including legs and underarms) for at least 48 hours prior to the first CHG shower.    Please follow these instructions carefully:  1.  Shower with CHG Soap the night before surgery and the  morning of Surgery.  2.  If you choose to wash your hair, wash your hair first as usual with your  normal  shampoo.  3.  After you shampoo, rinse your hair and body thoroughly to remove the  shampoo.                                        4.  Use CHG as you would any other liquid soap.  You can apply chg directly  to the skin and wash                       Gently with a scrungie or clean washcloth.  5.  Apply the CHG Soap to your body ONLY FROM THE NECK DOWN.   Do not use on face/ open                           Wound or open sores. Avoid contact with eyes, ears mouth and genitals (private parts).                       Wash face,  Genitals (private parts) with your normal soap.  6.  Wash thoroughly, paying special attention to the area where your surgery  will be performed.  7.  Thoroughly rinse your body with warm water from the neck down.  8.  DO NOT shower/wash with your normal soap after using and rinsing off  the CHG Soap.              9.  Pat yourself dry with a clean towel.            10.  Wear clean  pajamas.            11.  Place clean sheets on your bed the night of your first shower and do not  sleep with pets. Day of Surgery : Do not apply any lotions/deodorants the morning of surgery.  Please wear clean clothes to the hospital/surgery center.  FAILURE TO FOLLOW THESE INSTRUCTIONS MAY RESULT IN THE CANCELLATION OF YOUR SURGERY PATIENT SIGNATURE_________________________________  NURSE SIGNATURE__________________________________  ________________________________________________________________________   Adam Phenix  An incentive spirometer is a tool that can help keep your lungs clear and active. This tool measures how well you are filling your lungs with each breath. Taking long deep breaths may help reverse or decrease the chance of developing breathing (pulmonary) problems (especially infection) following:  A long period of time when you are unable to move or be active. BEFORE THE PROCEDURE   If the spirometer includes an indicator to show your best effort, your nurse or respiratory therapist will set it to a desired goal.  If possible, sit up straight or lean slightly forward. Try not to slouch.  Hold the incentive spirometer in an upright position. INSTRUCTIONS FOR USE  1. Sit on the edge of your bed if possible, or sit up as far as you can in bed or on a chair. 2. Hold the incentive spirometer in an upright position. 3. Breathe out normally. 4. Place the mouthpiece in your mouth and seal your lips tightly around it. 5. Breathe in slowly and as deeply as possible, raising the piston or the ball toward the top of the column. 6. Hold your breath for 3-5 seconds or for as long as possible. Allow the piston or ball to fall to the bottom of the column. 7. Remove the mouthpiece from your mouth and breathe out normally. 8. Rest for a few seconds and repeat Steps 1 through 7 at least 10 times every 1-2 hours when you are awake. Take your time and take a few normal breaths  between deep breaths. 9. The spirometer may include an indicator to show your best effort. Use the indicator as a goal to work toward during each repetition. 10. After each set of 10 deep breaths, practice coughing to be sure your lungs are clear. If you have an incision (the cut made at the time of surgery), support your incision when coughing by placing a pillow or rolled up towels firmly against it. Once you are able to get out of bed, walk around indoors and cough well. You may stop using the incentive spirometer when instructed by your caregiver.  RISKS AND COMPLICATIONS  Take your time so you do not get dizzy or light-headed.  If you are in pain, you may need to take or ask for pain medication before doing incentive spirometry. It is harder to take a deep breath if you are having pain. AFTER USE  Rest and breathe slowly and easily.  It can be helpful to keep track of a log of your progress.  Your caregiver can provide you with a simple table to help with this. If you are using the spirometer at home, follow these instructions: [place] IF:   You are having difficultly using the spirometer.  You have trouble using the spirometer as often as instructed.  Your pain medication is not giving enough relief while using the spirometer.  You develop fever of 100.5 F (38.1 C) or higher. SEEK IMMEDIATE MEDICAL CARE IF:   You cough up bloody sputum that had not been present before.  You develop fever of 102 F (38.9 C) or greater.  You develop worsening pain at or near the incision site. MAKE SURE YOU:   Understand these instructions.  Will watch your condition.  Will get help right away if you are not doing well or get worse. Document Released: 04/19/2007 Document Revised: 02/29/2012 Document Reviewed: 06/20/2007 [facility] Patient Information 2014 [place], [place].   ________________________________________________________________________

## 2020-02-16 ENCOUNTER — Encounter (HOSPITAL_COMMUNITY)
Admission: RE | Admit: 2020-02-16 | Discharge: 2020-02-16 | Disposition: A | Discharge status: Home / Self Care | Payer: BC Managed Care – PPO | Source: Ambulatory Visit | Attending: Orthopaedic Surgery | Admitting: Orthopaedic Surgery

## 2020-02-16 ENCOUNTER — Encounter (HOSPITAL_COMMUNITY): Payer: Self-pay

## 2020-02-16 ENCOUNTER — Other Ambulatory Visit: Payer: Self-pay

## 2020-02-16 NOTE — Progress Notes (Signed)
PCP - N. Alexnder DO Cardiologist - Dr. Stanford Breed  Chest x-ray - 07/11/19 EKG - 09/25/19 Stress Test - 08/10/19 ECHO - 07/07/19 Cardiac Cath - 07/07/19. Card work up for SOB results neg.  Sleep Study - yes CPAP - yes  Fasting Blood Sugar - 110-160 Checks Blood Sugar _____ times a day. Hardly ever.  Blood Thinner Instructions:ASA prescribed as preventative Aspirin Instructions:none. Pt told to call MD Last Dose:  Anesthesia review:   Patient denies shortness of breath, fever, cough and chest pain at PAT appointment yes  Patient verbalized understanding of instructions that were given to them at the PAT appointment. Patient was also instructed that they will need to review over the PAT instructions again at home before surgery. yes

## 2020-02-19 ENCOUNTER — Encounter (HOSPITAL_COMMUNITY)
Admission: RE | Admit: 2020-02-19 | Discharge: 2020-02-19 | Disposition: A | Discharge status: Home / Self Care | Payer: BC Managed Care – PPO | Source: Ambulatory Visit | Attending: Orthopaedic Surgery | Admitting: Orthopaedic Surgery

## 2020-02-19 ENCOUNTER — Other Ambulatory Visit: Payer: Self-pay

## 2020-02-19 ENCOUNTER — Telehealth: Payer: Self-pay | Admitting: Orthopaedic Surgery

## 2020-02-19 DIAGNOSIS — I129 Hypertensive chronic kidney disease with stage 1 through stage 4 chronic kidney disease, or unspecified chronic kidney disease: Secondary | ICD-10-CM | POA: Diagnosis not present

## 2020-02-19 DIAGNOSIS — F329 Major depressive disorder, single episode, unspecified: Secondary | ICD-10-CM | POA: Diagnosis not present

## 2020-02-19 DIAGNOSIS — F419 Anxiety disorder, unspecified: Secondary | ICD-10-CM | POA: Diagnosis not present

## 2020-02-19 DIAGNOSIS — Z87891 Personal history of nicotine dependence: Secondary | ICD-10-CM | POA: Diagnosis not present

## 2020-02-19 DIAGNOSIS — Z85820 Personal history of malignant melanoma of skin: Secondary | ICD-10-CM | POA: Diagnosis not present

## 2020-02-19 DIAGNOSIS — Z981 Arthrodesis status: Secondary | ICD-10-CM | POA: Insufficient documentation

## 2020-02-19 DIAGNOSIS — Z905 Acquired absence of kidney: Secondary | ICD-10-CM | POA: Insufficient documentation

## 2020-02-19 DIAGNOSIS — G4730 Sleep apnea, unspecified: Secondary | ICD-10-CM | POA: Diagnosis not present

## 2020-02-19 DIAGNOSIS — Z85528 Personal history of other malignant neoplasm of kidney: Secondary | ICD-10-CM | POA: Insufficient documentation

## 2020-02-19 DIAGNOSIS — E1122 Type 2 diabetes mellitus with diabetic chronic kidney disease: Secondary | ICD-10-CM | POA: Insufficient documentation

## 2020-02-19 DIAGNOSIS — N1831 Chronic kidney disease, stage 3a: Secondary | ICD-10-CM | POA: Insufficient documentation

## 2020-02-19 DIAGNOSIS — Z7982 Long term (current) use of aspirin: Secondary | ICD-10-CM | POA: Insufficient documentation

## 2020-02-19 DIAGNOSIS — J432 Centrilobular emphysema: Secondary | ICD-10-CM | POA: Diagnosis not present

## 2020-02-19 DIAGNOSIS — M1612 Unilateral primary osteoarthritis, left hip: Secondary | ICD-10-CM | POA: Diagnosis not present

## 2020-02-19 DIAGNOSIS — Z7984 Long term (current) use of oral hypoglycemic drugs: Secondary | ICD-10-CM | POA: Diagnosis not present

## 2020-02-19 DIAGNOSIS — Z01818 Encounter for other preprocedural examination: Secondary | ICD-10-CM | POA: Insufficient documentation

## 2020-02-19 DIAGNOSIS — Z01812 Encounter for preprocedural laboratory examination: Secondary | ICD-10-CM | POA: Insufficient documentation

## 2020-02-19 DIAGNOSIS — Z79899 Other long term (current) drug therapy: Secondary | ICD-10-CM | POA: Insufficient documentation

## 2020-02-19 LAB — HEMOGLOBIN A1C
Hgb A1c MFr Bld: 6.2 % — ABNORMAL HIGH (ref 4.8–5.6)
Mean Plasma Glucose: 131.24 mg/dL

## 2020-02-19 LAB — BASIC METABOLIC PANEL
Anion gap: 7 (ref 5–15)
BUN: 22 mg/dL (ref 8–23)
CO2: 26 mmol/L (ref 22–32)
Calcium: 9.3 mg/dL (ref 8.9–10.3)
Chloride: 105 mmol/L (ref 98–111)
Creatinine, Ser: 1.19 mg/dL — ABNORMAL HIGH (ref 0.44–1.00)
GFR calc Af Amer: 54 mL/min — ABNORMAL LOW (ref >60)
GFR calc non Af Amer: 47 mL/min — ABNORMAL LOW (ref >60)
Glucose, Bld: 105 mg/dL — ABNORMAL HIGH (ref 70–99)
Potassium: 4.3 mmol/L (ref 3.5–5.1)
Sodium: 138 mmol/L (ref 135–145)

## 2020-02-19 LAB — CBC
HCT: 37.2 % (ref 36.0–46.0)
Hemoglobin: 11.8 g/dL — ABNORMAL LOW (ref 12.0–15.0)
MCH: 29.6 pg (ref 26.0–34.0)
MCHC: 31.7 g/dL (ref 30.0–36.0)
MCV: 93.2 fL (ref 80.0–100.0)
Platelets: 308 10*3/uL (ref 150–400)
RBC: 3.99 MIL/uL (ref 3.87–5.11)
RDW: 13.6 % (ref 11.5–15.5)
WBC: 6.6 10*3/uL (ref 4.0–10.5)
nRBC: 0 % (ref 0.0–0.2)

## 2020-02-19 LAB — ABO/RH: ABO/RH(D): A POS

## 2020-02-19 LAB — SURGICAL PCR SCREEN
MRSA, PCR: NEGATIVE
Staphylococcus aureus: NEGATIVE

## 2020-02-19 NOTE — Telephone Encounter (Signed)
Patient having surgery on 02/23/20 and wanted to know if and when she should stop her 81mg  Asprin?  Please call patient to advise.  [phone]

## 2020-02-19 NOTE — Telephone Encounter (Signed)
She doesn't need to stop this does she?

## 2020-02-19 NOTE — Telephone Encounter (Signed)
Patient aware of below message

## 2020-02-19 NOTE — Telephone Encounter (Signed)
I agree, she does not need to stop it.

## 2020-02-20 ENCOUNTER — Other Ambulatory Visit (HOSPITAL_COMMUNITY)
Admission: RE | Admit: 2020-02-20 | Discharge: 2020-02-20 | Disposition: A | Discharge status: Home / Self Care | Payer: BC Managed Care – PPO | Source: Ambulatory Visit | Attending: Orthopaedic Surgery | Admitting: Orthopaedic Surgery

## 2020-02-20 DIAGNOSIS — G4733 Obstructive sleep apnea (adult) (pediatric): Secondary | ICD-10-CM | POA: Diagnosis not present

## 2020-02-20 DIAGNOSIS — Z20822 Contact with and (suspected) exposure to covid-19: Secondary | ICD-10-CM | POA: Diagnosis not present

## 2020-02-20 DIAGNOSIS — Z01812 Encounter for preprocedural laboratory examination: Secondary | ICD-10-CM | POA: Insufficient documentation

## 2020-02-20 LAB — SARS CORONAVIRUS 2 (TAT 6-24 HRS): SARS Coronavirus 2: NEGATIVE

## 2020-02-20 NOTE — Progress Notes (Signed)
Anesthesia Chart Review   Case: [id number] Date/Time: 02/23/20 0700   Procedure: LEFT TOTAL HIP ARTHROPLASTY ANTERIOR APPROACH (Left Hip)   Anesthesia type: Choice   Pre-op diagnosis: left hip endstage osteoarthritis   Location: [facility]OM 09 / [facility]   Surgeons: Mcarthur Rossetti, MD      DISCUSSION:69 y.o. former smoker (40 pack years, quit 12/21/10) with h/o HTN, sleep apnea w/CPAP, emphysema, DM II, CKD Stage 3a, left hip OA scheduled for above procedure 02/23/2020 with Dr. Jean Rosenthal.   Pt seen by cardiology for evaluation of chronic dyspnea 07/2019.  Abnormal nuclear study 08/10/2019.  Cardiac cath which showed minimal to mild CAD.  Prn follow up.   Last seen by PCP 02/04/20.  Stable at this visit.   Anticipate pt can proceed with planned procedure barring acute status change.   VS: BP (!) 145/85   Pulse 74   Temp 36.6 C (Oral)   Resp 16   Ht '5\' 9"'  (1.753 m)   Wt 132 kg   SpO2 100%   BMI 42.97 kg/m   PROVIDERS: Emeterio Reeve, DO is PCP   Kirk Ruths, MD is Cardiologist  LABS: Labs reviewed: Acceptable for surgery. (all labs ordered are listed, but only abnormal results are displayed)  Labs Reviewed  BASIC METABOLIC PANEL - Abnormal; Notable for the following components:      Result Value   Glucose, Bld 105 (*)    Creatinine, Ser 1.19 (*)    GFR calc non Af Amer 47 (*)    GFR calc Af Amer 54 (*)    All other components within normal limits  CBC - Abnormal; Notable for the following components:   Hemoglobin 11.8 (*)    All other components within normal limits  HEMOGLOBIN A1C - Abnormal; Notable for the following components:   Hgb A1c MFr Bld 6.2 (*)    All other components within normal limits  SURGICAL PCR SCREEN  TYPE AND SCREEN  ABO/RH     IMAGES:   EKG: 09/25/2019 Rate 79 bpm  Normal sinus rhythm  Left axis deviation  Right bundle branch block   CV: Cardiac Cath 04/13/9531  LV end diastolic pressure is normal.  There is no  aortic valve stenosis.  Angiographically minimal to mild CAD.   SUMMARY  Angiographically mild (less than 20%) disease in the LAD, otherwise angiographically normal coronary arteries  Normal LVEDP, with no normal EF by echo.  RECOMMENDATIONS  Consider evaluation for non-ischemic and potentially noncardiac etiology for her symptoms.  False positive stress test  Anticipate discharge home after bedrest  Myocardial Perfusion Imaging 08/10/2019  The left ventricular ejection fraction is normal (55-65%).  Nuclear stress EF: 58%.  There was no ST segment deviation noted during stress.  No T wave inversion was noted during stress.  Defect 1: There is a large defect of mild severity present in the basal anterior, basal anteroseptal, basal inferoseptal, basal inferior, mid anterior, mid anteroseptal, mid inferoseptal, mid inferior and apical inferior location.  Findings consistent with ischemia.  This is an intermediate risk study.  No prior study for comparison.   At rest, there is a small anterior and septal basal defect. With stress, the defect enlarges and extends to inferior wall towards apex. This is concerning for ischemia. TID is 1.20, cannot exclude balanced ischemia. Resting wall motion and LV function is normal.  Echo 07/07/2019 IMPRESSIONS    1. The left ventricle has hyperdynamic systolic function, with an  ejection fraction of >65%.  The cavity size was normal. Left ventricular  diastolic Doppler parameters are consistent with impaired relaxation.  Indeterminate filling pressures The E/e' is  8-15. No evidence of left ventricular regional wall motion abnormalities.  2. The mitral valve is grossly normal.  3. The aortic valve was not well visualized. No stenosis of the aortic  valve.  4. The aortic root is normal in size and structure. Past Medical History:  Diagnosis Date  . Abnormal mammogram of right breast 01/20/2018  . Anxiety   . Benign cyst of right  breast 01/26/2018  . Blood transfusion without reported diagnosis   . Cataract   . Centrilobular emphysema ([place]) 07/25/2018   mild  . 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 Chronic low back pain   . Colon polyps   . Depression   . Diabetes mellitus without complication ([place])   . Emphysema of lung ([place])   . Fatty liver   . Hypertension   . Melanoma ([place])    R upper extremity  . Melanoma in situ of right upper extremity ([place])   . Mild diastolic dysfunction 2/95/7473  . Osteoarthritis of lumbar spine   . Osteoporosis   . Renal cancer ([place])   . Skin cancer   . Sleep apnea 2008   cpap nightly  . Sphincter of Oddi dysfunction    bile duct    Past Surgical History:  Procedure Laterality Date  . ABDOMINAL HYSTERECTOMY    . ANKLE SURGERY Left    in her 43s  . BILE DUCT STENT PLACEMENT    . CARDIAC CATHETERIZATION    . CHOLECYSTECTOMY    . COLONOSCOPY    . LEFT HEART CATH AND CORONARY ANGIOGRAPHY N/A 09/05/2019   Procedure: LEFT HEART CATH AND CORONARY ANGIOGRAPHY;  Surgeon: Leonie Man, MD;  Location: [facility];  Service: Cardiovascular;  Laterality: N/A;  . LUMBAR FUSION     7 level   . MOHS SURGERY     x 11  . PARTIAL NEPHRECTOMY    . POLYPECTOMY    . UPPER GASTROINTESTINAL ENDOSCOPY      MEDICATIONS: . acetaminophen (TYLENOL) 650 MG CR tablet  . allopurinol (ZYLOPRIM) 100 MG tablet  . ALPRAZolam (XANAX) 0.25 MG tablet  . aspirin 81 MG tablet  . atorvastatin (LIPITOR) 40 MG tablet  . blood glucose meter kit and supplies KIT  . buPROPion (WELLBUTRIN SR) 150 MG 12 hr tablet  . Cholecalciferol (VITAMIN D3) 50 MCG (2000 UT) TABS  . enalapril (VASOTEC) 10 MG tablet  . fluticasone (FLONASE) 50 MCG/ACT nasal spray  . HYDROcodone-acetaminophen (NORCO/VICODIN) 5-325 MG tablet  . Multiple Vitamin tablet  . Omega-3 Fatty Acids (FISH OIL) 1000 MG CAPS  . ONE TOUCH ULTRA TEST test strip  . ONETOUCH DELICA LANCETS 40Z MISC  . Semaglutide  (RYBELSUS) 7 MG TABS  . traZODone (DESYREL) 50 MG tablet  . triamterene-hydrochlorothiazide (MAXZIDE-25) 37.5-25 MG tablet  . venlafaxine XR (EFFEXOR-XR) 75 MG 24 hr capsule   No current facility-administered medications for this encounter.    Maia Plan [facility] Pre-Surgical Testing [phone] 02/20/20  10:08 AM

## 2020-02-22 ENCOUNTER — Encounter (HOSPITAL_COMMUNITY): Payer: Self-pay | Admitting: Orthopaedic Surgery

## 2020-02-22 MED ORDER — DEXTROSE 5 % IV SOLN
3.0000 g | INTRAVENOUS | Status: AC
  Filled 2020-02-22: qty 3000

## 2020-02-22 NOTE — Anesthesia Preprocedure Evaluation (Addendum)
Anesthesia Evaluation  Patient identified by MRN, date of birth, ID band Patient awake    Reviewed: Allergy & Precautions, NPO status , Patient's Chart, lab work & pertinent test results  Airway Mallampati: II  TM Distance: >3 FB Neck ROM: Full    Dental no notable dental hx. (+) Teeth Intact, Dental Advisory Given   Pulmonary sleep apnea and Continuous Positive Airway Pressure Ventilation , COPD, former smoker,    Pulmonary exam normal breath sounds clear to auscultation       Cardiovascular hypertension, Pt. on medications Normal cardiovascular exam Rhythm:Regular Rate:Normal     Neuro/Psych PSYCHIATRIC DISORDERS Anxiety Depression  Neuromuscular disease    GI/Hepatic negative GI ROS, Neg liver ROS,   Endo/Other  diabetes, Type 2, Oral Hypoglycemic AgentsMorbid obesity  Renal/GU Renal InsufficiencyRenal disease     Musculoskeletal  (+) Arthritis , Osteoarthritis,    Abdominal (+) + obese,   Peds  Hematology hgb 11.8 plt 308   Anesthesia Other Findings  LV end diastolic pressure is normal.  There is no aortic valve stenosis.  Angiographically minimal to mild CAD.   SUMMARY  Angiographically mild (less than 20%) disease in the LAD, otherwise angiographically normal coronary arteries  Normal LVEDP, with no normal EF by echo.  RECOMMENDATIONS  Consider evaluation for non-ischemic and potentially noncardiac etiology for her symptoms.  False positive stress test  Anticipate discharge home after bedrest  Follow-up with Dr. Stanford Breed as directed.   Reproductive/Obstetrics                          Anesthesia Physical Anesthesia Plan  ASA: IV  Anesthesia Plan: Spinal and General   Post-op Pain Management:    Induction: Intravenous  PONV Risk Score and Plan: 2 and Ondansetron and Propofol infusion  Airway Management Planned: Oral ETT  Additional Equipment: None  Intra-op  Plan:   Post-operative Plan: Extubation in OR  Informed Consent: I have reviewed the patients History and Physical, chart, labs and discussed the procedure including the risks, benefits and alternatives for the proposed anesthesia with the patient or authorized representative who has indicated his/her understanding and acceptance.     Dental advisory given  Plan Discussed with: CRNA and Anesthesiologist  Anesthesia Plan Comments: (Will attempt spinal pt with Rods to t8 , backup GA pt aware)     Anesthesia Quick Evaluation

## 2020-02-23 ENCOUNTER — Other Ambulatory Visit: Payer: Self-pay

## 2020-02-23 ENCOUNTER — Telehealth: Payer: Self-pay

## 2020-02-23 NOTE — Telephone Encounter (Signed)
Needs more 10 mg Hydrocodone.  Do you want to Rx?  Was previously prescribed by Ortho in December.  She already has 5 mg.  She can take 2 of these instead if okay.   Per speaking with Dr. Ninfa Linden, okay to take 2 Hydrocodone 5mg .  I called and advised patient.

## 2020-02-27 ENCOUNTER — Other Ambulatory Visit (HOSPITAL_COMMUNITY)
Admission: RE | Admit: 2020-02-27 | Discharge: 2020-02-27 | Disposition: A | Discharge status: Home / Self Care | Payer: BC Managed Care – PPO | Source: Ambulatory Visit | Attending: Orthopaedic Surgery | Admitting: Orthopaedic Surgery

## 2020-02-27 DIAGNOSIS — Z20822 Contact with and (suspected) exposure to covid-19: Secondary | ICD-10-CM | POA: Diagnosis not present

## 2020-02-27 DIAGNOSIS — Z01812 Encounter for preprocedural laboratory examination: Secondary | ICD-10-CM | POA: Insufficient documentation

## 2020-02-27 LAB — SARS CORONAVIRUS 2 (TAT 6-24 HRS): SARS Coronavirus 2: NEGATIVE

## 2020-02-27 NOTE — Progress Notes (Signed)
Patient still has same prep instructions and will folllow for surgery on 03/01/2020.  Patient to be COVID tested on 02/27/2020.  Labs done 02/19/20 along with PCR.

## 2020-02-29 LAB — TYPE AND SCREEN
ABO/RH(D): A POS
Antibody Screen: NEGATIVE

## 2020-03-01 ENCOUNTER — Other Ambulatory Visit: Payer: Self-pay

## 2020-03-01 ENCOUNTER — Observation Stay (HOSPITAL_COMMUNITY): Payer: BC Managed Care – PPO

## 2020-03-01 ENCOUNTER — Ambulatory Visit (HOSPITAL_COMMUNITY): Payer: BC Managed Care – PPO

## 2020-03-01 ENCOUNTER — Encounter (HOSPITAL_COMMUNITY)
Admission: RE | Disposition: A | Discharge status: Home Health | Payer: Self-pay | Source: Home / Self Care | Attending: Orthopaedic Surgery

## 2020-03-01 ENCOUNTER — Ambulatory Visit (HOSPITAL_COMMUNITY): Payer: BC Managed Care – PPO | Admitting: Physician Assistant

## 2020-03-01 ENCOUNTER — Inpatient Hospital Stay (HOSPITAL_COMMUNITY)
Admission: RE | Admit: 2020-03-01 | Discharge: 2020-03-03 | DRG: 470 | Disposition: A | Discharge status: Home Health | Payer: BC Managed Care – PPO | Attending: Orthopaedic Surgery | Admitting: Orthopaedic Surgery

## 2020-03-01 ENCOUNTER — Encounter (HOSPITAL_COMMUNITY): Payer: Self-pay | Admitting: Orthopaedic Surgery

## 2020-03-01 ENCOUNTER — Ambulatory Visit (HOSPITAL_COMMUNITY): Payer: BC Managed Care – PPO | Admitting: Anesthesiology

## 2020-03-01 DIAGNOSIS — Z885 Allergy status to narcotic agent status: Secondary | ICD-10-CM

## 2020-03-01 DIAGNOSIS — M1612 Unilateral primary osteoarthritis, left hip: Principal | ICD-10-CM

## 2020-03-01 DIAGNOSIS — G4733 Obstructive sleep apnea (adult) (pediatric): Secondary | ICD-10-CM | POA: Diagnosis not present

## 2020-03-01 DIAGNOSIS — Z8249 Family history of ischemic heart disease and other diseases of the circulatory system: Secondary | ICD-10-CM | POA: Diagnosis not present

## 2020-03-01 DIAGNOSIS — Z981 Arthrodesis status: Secondary | ICD-10-CM

## 2020-03-01 DIAGNOSIS — Z96642 Presence of left artificial hip joint: Secondary | ICD-10-CM | POA: Diagnosis not present

## 2020-03-01 DIAGNOSIS — F329 Major depressive disorder, single episode, unspecified: Secondary | ICD-10-CM | POA: Diagnosis not present

## 2020-03-01 DIAGNOSIS — Z888 Allergy status to other drugs, medicaments and biological substances status: Secondary | ICD-10-CM

## 2020-03-01 DIAGNOSIS — Z85820 Personal history of malignant melanoma of skin: Secondary | ICD-10-CM

## 2020-03-01 DIAGNOSIS — N1831 Chronic kidney disease, stage 3a: Secondary | ICD-10-CM | POA: Diagnosis present

## 2020-03-01 DIAGNOSIS — Z7984 Long term (current) use of oral hypoglycemic drugs: Secondary | ICD-10-CM

## 2020-03-01 DIAGNOSIS — Y838 Other surgical procedures as the cause of abnormal reaction of the patient, or of later complication, without mention of misadventure at the time of the procedure: Secondary | ICD-10-CM | POA: Diagnosis not present

## 2020-03-01 DIAGNOSIS — Z8349 Family history of other endocrine, nutritional and metabolic diseases: Secondary | ICD-10-CM | POA: Diagnosis not present

## 2020-03-01 DIAGNOSIS — Z6841 Body Mass Index (BMI) 40.0 and over, adult: Secondary | ICD-10-CM | POA: Diagnosis not present

## 2020-03-01 DIAGNOSIS — Z471 Aftercare following joint replacement surgery: Secondary | ICD-10-CM | POA: Diagnosis not present

## 2020-03-01 DIAGNOSIS — D62 Acute posthemorrhagic anemia: Secondary | ICD-10-CM | POA: Diagnosis not present

## 2020-03-01 DIAGNOSIS — M109 Gout, unspecified: Secondary | ICD-10-CM | POA: Diagnosis present

## 2020-03-01 DIAGNOSIS — J432 Centrilobular emphysema: Secondary | ICD-10-CM | POA: Diagnosis present

## 2020-03-01 DIAGNOSIS — Z91040 Latex allergy status: Secondary | ICD-10-CM

## 2020-03-01 DIAGNOSIS — E785 Hyperlipidemia, unspecified: Secondary | ICD-10-CM | POA: Diagnosis not present

## 2020-03-01 DIAGNOSIS — I129 Hypertensive chronic kidney disease with stage 1 through stage 4 chronic kidney disease, or unspecified chronic kidney disease: Secondary | ICD-10-CM | POA: Diagnosis present

## 2020-03-01 DIAGNOSIS — Z87891 Personal history of nicotine dependence: Secondary | ICD-10-CM | POA: Diagnosis not present

## 2020-03-01 DIAGNOSIS — E1169 Type 2 diabetes mellitus with other specified complication: Secondary | ICD-10-CM | POA: Diagnosis not present

## 2020-03-01 DIAGNOSIS — Z419 Encounter for procedure for purposes other than remedying health state, unspecified: Secondary | ICD-10-CM

## 2020-03-01 DIAGNOSIS — I2510 Atherosclerotic heart disease of native coronary artery without angina pectoris: Secondary | ICD-10-CM | POA: Diagnosis present

## 2020-03-01 DIAGNOSIS — E1122 Type 2 diabetes mellitus with diabetic chronic kidney disease: Secondary | ICD-10-CM | POA: Diagnosis present

## 2020-03-01 DIAGNOSIS — Z85528 Personal history of other malignant neoplasm of kidney: Secondary | ICD-10-CM

## 2020-03-01 HISTORY — PX: TOTAL HIP ARTHROPLASTY: SHX124

## 2020-03-01 LAB — GLUCOSE, CAPILLARY
Glucose-Capillary: 123 mg/dL — ABNORMAL HIGH (ref 70–99)
Glucose-Capillary: 124 mg/dL — ABNORMAL HIGH (ref 70–99)
Glucose-Capillary: 144 mg/dL — ABNORMAL HIGH (ref 70–99)
Glucose-Capillary: 126 mg/dL — ABNORMAL HIGH (ref 70–99)
Glucose-Capillary: 137 mg/dL — ABNORMAL HIGH (ref 70–99)

## 2020-03-01 SURGERY — ARTHROPLASTY, HIP, TOTAL, ANTERIOR APPROACH
Anesthesia: General | Site: Hip | Laterality: Left

## 2020-03-01 MED ORDER — FENTANYL CITRATE (PF) 100 MCG/2ML IJ SOLN
INTRAMUSCULAR | Status: AC
  Filled 2020-03-01: qty 2

## 2020-03-01 MED ORDER — ENALAPRIL MALEATE 10 MG PO TABS
10.0000 mg | ORAL_TABLET | Freq: Every day | ORAL | Status: DC
  Administered 2020-03-01 – 2020-03-03 (×3): 10 mg via ORAL
  Filled 2020-03-01 (×3): qty 1

## 2020-03-01 MED ORDER — OXYCODONE HCL 5 MG PO TABS
10.0000 mg | ORAL_TABLET | ORAL | Status: DC | PRN
  Administered 2020-03-01 (×2): 15 mg via ORAL
  Administered 2020-03-02 – 2020-03-03 (×5): 10 mg via ORAL
  Filled 2020-03-01 (×2): qty 3
  Filled 2020-03-01 (×2): qty 2

## 2020-03-01 MED ORDER — DEXAMETHASONE SODIUM PHOSPHATE 10 MG/ML IJ SOLN
INTRAMUSCULAR | Status: AC
  Filled 2020-03-01: qty 1

## 2020-03-01 MED ORDER — MENTHOL 3 MG MT LOZG
1.0000 | LOZENGE | OROMUCOSAL | Status: DC | PRN
  Filled 2020-03-01: qty 9

## 2020-03-01 MED ORDER — METOCLOPRAMIDE HCL 5 MG/ML IJ SOLN: 5.0000 mg | Freq: Three times a day (TID) | INTRAMUSCULAR | Status: DC | PRN

## 2020-03-01 MED ORDER — PHENYLEPHRINE 40 MCG/ML (10ML) SYRINGE FOR IV PUSH (FOR BLOOD PRESSURE SUPPORT)
PREFILLED_SYRINGE | INTRAVENOUS | Status: AC
  Filled 2020-03-01: qty 10

## 2020-03-01 MED ORDER — SEMAGLUTIDE 7 MG PO TABS: 7.0000 mg | ORAL_TABLET | Freq: Every day | ORAL | Status: DC

## 2020-03-01 MED ORDER — CHLORHEXIDINE GLUCONATE 4 % EX LIQD: 60.0000 mL | Freq: Once | CUTANEOUS | Status: DC

## 2020-03-01 MED ORDER — VITAMIN D 25 MCG (1000 UNIT) PO TABS
2000.0000 [IU] | ORAL_TABLET | Freq: Every day | ORAL | Status: DC
  Administered 2020-03-02 – 2020-03-03 (×2): 2000 [IU] via ORAL
  Filled 2020-03-01 (×2): qty 2

## 2020-03-01 MED ORDER — ROCURONIUM BROMIDE 10 MG/ML (PF) SYRINGE
PREFILLED_SYRINGE | INTRAVENOUS | Status: DC | PRN
  Administered 2020-03-01: 20 mg via INTRAVENOUS
  Administered 2020-03-01: 50 mg via INTRAVENOUS

## 2020-03-01 MED ORDER — ONDANSETRON HCL 4 MG PO TABS: 4.0000 mg | ORAL_TABLET | Freq: Four times a day (QID) | ORAL | Status: DC | PRN

## 2020-03-01 MED ORDER — ALLOPURINOL 100 MG PO TABS
100.0000 mg | ORAL_TABLET | Freq: Every day | ORAL | Status: DC
  Administered 2020-03-02 – 2020-03-03 (×2): 100 mg via ORAL
  Filled 2020-03-01 (×2): qty 1

## 2020-03-01 MED ORDER — VENLAFAXINE HCL ER 75 MG PO CP24
75.0000 mg | ORAL_CAPSULE | Freq: Every day | ORAL | Status: DC
  Administered 2020-03-02 – 2020-03-03 (×2): 75 mg via ORAL
  Filled 2020-03-01 (×2): qty 1

## 2020-03-01 MED ORDER — ACETAMINOPHEN 10 MG/ML IV SOLN
1000.0000 mg | Freq: Once | INTRAVENOUS | Status: DC | PRN
  Administered 2020-03-01: 1000 mg via INTRAVENOUS

## 2020-03-01 MED ORDER — METHOCARBAMOL 500 MG IVPB - SIMPLE MED
INTRAVENOUS | Status: AC
  Filled 2020-03-01: qty 50

## 2020-03-01 MED ORDER — MIDAZOLAM HCL 2 MG/2ML IJ SOLN
INTRAMUSCULAR | Status: AC
  Filled 2020-03-01: qty 2

## 2020-03-01 MED ORDER — ONDANSETRON HCL 4 MG/2ML IJ SOLN
INTRAMUSCULAR | Status: DC | PRN
  Administered 2020-03-01: 4 mg via INTRAVENOUS

## 2020-03-01 MED ORDER — TRANEXAMIC ACID-NACL 1000-0.7 MG/100ML-% IV SOLN
1000.0000 mg | INTRAVENOUS | Status: AC
  Administered 2020-03-01: 1000 mg via INTRAVENOUS
  Filled 2020-03-01: qty 100

## 2020-03-01 MED ORDER — PROPOFOL 10 MG/ML IV BOLUS
INTRAVENOUS | Status: DC | PRN
  Administered 2020-03-01: 190 mg via INTRAVENOUS
  Administered 2020-03-01: 10 mg via INTRAVENOUS

## 2020-03-01 MED ORDER — DEXAMETHASONE SODIUM PHOSPHATE 10 MG/ML IJ SOLN
INTRAMUSCULAR | Status: DC | PRN
  Administered 2020-03-01: 8 mg via INTRAVENOUS

## 2020-03-01 MED ORDER — DOCUSATE SODIUM 100 MG PO CAPS
100.0000 mg | ORAL_CAPSULE | Freq: Two times a day (BID) | ORAL | Status: DC
  Administered 2020-03-01 – 2020-03-03 (×5): 100 mg via ORAL
  Filled 2020-03-01 (×5): qty 1

## 2020-03-01 MED ORDER — PROPOFOL 10 MG/ML IV BOLUS
INTRAVENOUS | Status: AC
  Filled 2020-03-01: qty 20

## 2020-03-01 MED ORDER — SODIUM CHLORIDE 0.9 % IR SOLN
Status: DC | PRN
  Administered 2020-03-01: 1000 mL

## 2020-03-01 MED ORDER — ALUM & MAG HYDROXIDE-SIMETH 200-200-20 MG/5ML PO SUSP: 30.0000 mL | ORAL | Status: DC | PRN

## 2020-03-01 MED ORDER — LIP MEDEX EX OINT
TOPICAL_OINTMENT | CUTANEOUS | Status: AC
  Filled 2020-03-01: qty 7

## 2020-03-01 MED ORDER — DEXMEDETOMIDINE HCL IN NACL 200 MCG/50ML IV SOLN
INTRAVENOUS | Status: DC | PRN
  Administered 2020-03-01 (×2): 8 ug via INTRAVENOUS
  Administered 2020-03-01: 12 ug via INTRAVENOUS
  Administered 2020-03-01: 8 ug via INTRAVENOUS

## 2020-03-01 MED ORDER — HYDROMORPHONE HCL 2 MG/ML IJ SOLN
INTRAMUSCULAR | Status: AC
  Filled 2020-03-01: qty 1

## 2020-03-01 MED ORDER — FLUTICASONE PROPIONATE 50 MCG/ACT NA SUSP
2.0000 | Freq: Every day | NASAL | Status: DC | PRN
  Filled 2020-03-01: qty 16

## 2020-03-01 MED ORDER — ASPIRIN 81 MG PO CHEW
81.0000 mg | CHEWABLE_TABLET | Freq: Two times a day (BID) | ORAL | Status: DC
  Administered 2020-03-01 – 2020-03-03 (×4): 81 mg via ORAL
  Filled 2020-03-01 (×4): qty 1

## 2020-03-01 MED ORDER — ACETAMINOPHEN 325 MG PO TABS: 325.0000 mg | ORAL_TABLET | Freq: Four times a day (QID) | ORAL | Status: DC | PRN

## 2020-03-01 MED ORDER — GABAPENTIN 100 MG PO CAPS
100.0000 mg | ORAL_CAPSULE | Freq: Three times a day (TID) | ORAL | Status: DC
  Administered 2020-03-01 – 2020-03-03 (×6): 100 mg via ORAL
  Filled 2020-03-01 (×6): qty 1

## 2020-03-01 MED ORDER — HYDROMORPHONE HCL 1 MG/ML IJ SOLN: 0.5000 mg | INTRAMUSCULAR | Status: DC | PRN

## 2020-03-01 MED ORDER — PANTOPRAZOLE SODIUM 40 MG PO TBEC
40.0000 mg | DELAYED_RELEASE_TABLET | Freq: Every day | ORAL | Status: DC
  Administered 2020-03-01 – 2020-03-03 (×3): 40 mg via ORAL
  Filled 2020-03-01 (×3): qty 1

## 2020-03-01 MED ORDER — METOCLOPRAMIDE HCL 5 MG PO TABS
5.0000 mg | ORAL_TABLET | Freq: Three times a day (TID) | ORAL | Status: DC | PRN
  Administered 2020-03-01: 5 mg via ORAL
  Filled 2020-03-01: qty 1

## 2020-03-01 MED ORDER — SUCCINYLCHOLINE CHLORIDE 200 MG/10ML IV SOSY
PREFILLED_SYRINGE | INTRAVENOUS | Status: DC | PRN
  Administered 2020-03-01: 120 mg via INTRAVENOUS

## 2020-03-01 MED ORDER — SODIUM CHLORIDE 0.9 % IV SOLN: INTRAVENOUS | Status: DC

## 2020-03-01 MED ORDER — DEXMEDETOMIDINE HCL IN NACL 200 MCG/50ML IV SOLN
INTRAVENOUS | Status: AC
  Filled 2020-03-01: qty 50

## 2020-03-01 MED ORDER — HYDROMORPHONE HCL 1 MG/ML IJ SOLN
INTRAMUSCULAR | Status: DC | PRN
  Administered 2020-03-01 (×4): .5 mg via INTRAVENOUS

## 2020-03-01 MED ORDER — POVIDONE-IODINE 10 % EX SWAB
2.0000 "application " | Freq: Once | CUTANEOUS | Status: AC
  Administered 2020-03-01: 2 via TOPICAL

## 2020-03-01 MED ORDER — ADULT MULTIVITAMIN W/MINERALS CH
1.0000 | ORAL_TABLET | Freq: Every day | ORAL | Status: DC
  Administered 2020-03-02 – 2020-03-03 (×2): 1 via ORAL
  Filled 2020-03-01 (×2): qty 1

## 2020-03-01 MED ORDER — OXYCODONE HCL 5 MG PO TABS
5.0000 mg | ORAL_TABLET | ORAL | Status: DC | PRN
  Administered 2020-03-01: 10 mg via ORAL
  Administered 2020-03-02: 5 mg via ORAL
  Administered 2020-03-03 (×2): 10 mg via ORAL
  Filled 2020-03-01: qty 1
  Filled 2020-03-01 (×6): qty 2

## 2020-03-01 MED ORDER — PHENOL 1.4 % MT LIQD: 1.0000 | OROMUCOSAL | Status: DC | PRN

## 2020-03-01 MED ORDER — TRIAMTERENE-HCTZ 37.5-25 MG PO TABS
1.0000 | ORAL_TABLET | Freq: Every day | ORAL | Status: DC
  Administered 2020-03-01 – 2020-03-03 (×3): 1 via ORAL
  Filled 2020-03-01 (×3): qty 1

## 2020-03-01 MED ORDER — BUPROPION HCL ER (SR) 150 MG PO TB12
150.0000 mg | ORAL_TABLET | Freq: Every day | ORAL | Status: DC
  Administered 2020-03-02 – 2020-03-03 (×2): 150 mg via ORAL
  Filled 2020-03-01 (×2): qty 1

## 2020-03-01 MED ORDER — METHOCARBAMOL 500 MG PO TABS
500.0000 mg | ORAL_TABLET | Freq: Four times a day (QID) | ORAL | Status: DC | PRN
  Administered 2020-03-01 – 2020-03-02 (×4): 500 mg via ORAL
  Filled 2020-03-01 (×4): qty 1

## 2020-03-01 MED ORDER — SUGAMMADEX SODIUM 500 MG/5ML IV SOLN
INTRAVENOUS | Status: AC
  Filled 2020-03-01: qty 5

## 2020-03-01 MED ORDER — ONDANSETRON HCL 4 MG/2ML IJ SOLN: 4.0000 mg | Freq: Four times a day (QID) | INTRAMUSCULAR | Status: DC | PRN

## 2020-03-01 MED ORDER — LIDOCAINE 2% (20 MG/ML) 5 ML SYRINGE
INTRAMUSCULAR | Status: DC | PRN
  Administered 2020-03-01: 40 mg via INTRAVENOUS

## 2020-03-01 MED ORDER — MIDAZOLAM HCL 2 MG/2ML IJ SOLN
INTRAMUSCULAR | Status: DC | PRN
  Administered 2020-03-01 (×2): 1 mg via INTRAVENOUS

## 2020-03-01 MED ORDER — ATORVASTATIN CALCIUM 40 MG PO TABS
40.0000 mg | ORAL_TABLET | Freq: Every day | ORAL | Status: DC
  Administered 2020-03-01 – 2020-03-02 (×2): 40 mg via ORAL
  Filled 2020-03-01 (×2): qty 1

## 2020-03-01 MED ORDER — DEXTROSE 5 % IV SOLN
3.0000 g | Freq: Once | INTRAVENOUS | Status: AC
  Administered 2020-03-01: 3 g via INTRAVENOUS
  Filled 2020-03-01: qty 3

## 2020-03-01 MED ORDER — METHOCARBAMOL 500 MG IVPB - SIMPLE MED
500.0000 mg | Freq: Four times a day (QID) | INTRAVENOUS | Status: DC | PRN
  Administered 2020-03-01: 500 mg via INTRAVENOUS
  Filled 2020-03-01: qty 50

## 2020-03-01 MED ORDER — 0.9 % SODIUM CHLORIDE (POUR BTL) OPTIME
TOPICAL | Status: DC | PRN
  Administered 2020-03-01: 1000 mL

## 2020-03-01 MED ORDER — LACTATED RINGERS IV SOLN: INTRAVENOUS | Status: DC

## 2020-03-01 MED ORDER — ONDANSETRON HCL 4 MG/2ML IJ SOLN
INTRAMUSCULAR | Status: AC
  Filled 2020-03-01: qty 2

## 2020-03-01 MED ORDER — ACETAMINOPHEN 10 MG/ML IV SOLN
INTRAVENOUS | Status: AC
  Filled 2020-03-01: qty 100

## 2020-03-01 MED ORDER — POLYETHYLENE GLYCOL 3350 17 G PO PACK: 17.0000 g | PACK | Freq: Every day | ORAL | Status: DC | PRN

## 2020-03-01 MED ORDER — LIDOCAINE 2% (20 MG/ML) 5 ML SYRINGE
INTRAMUSCULAR | Status: AC
  Filled 2020-03-01: qty 5

## 2020-03-01 MED ORDER — PHENYLEPHRINE 40 MCG/ML (10ML) SYRINGE FOR IV PUSH (FOR BLOOD PRESSURE SUPPORT)
PREFILLED_SYRINGE | INTRAVENOUS | Status: DC | PRN
  Administered 2020-03-01 (×3): 40 ug via INTRAVENOUS

## 2020-03-01 MED ORDER — FENTANYL CITRATE (PF) 250 MCG/5ML IJ SOLN
INTRAMUSCULAR | Status: DC | PRN
  Administered 2020-03-01 (×2): 50 ug via INTRAVENOUS

## 2020-03-01 MED ORDER — ONDANSETRON HCL 4 MG/2ML IJ SOLN: 4.0000 mg | Freq: Once | INTRAMUSCULAR | Status: DC | PRN

## 2020-03-01 MED ORDER — FENTANYL CITRATE (PF) 100 MCG/2ML IJ SOLN
25.0000 ug | INTRAMUSCULAR | Status: DC | PRN
  Administered 2020-03-01 (×2): 50 ug via INTRAVENOUS

## 2020-03-01 MED ORDER — CEFAZOLIN SODIUM-DEXTROSE 1-4 GM/50ML-% IV SOLN
1.0000 g | Freq: Four times a day (QID) | INTRAVENOUS | Status: AC
  Administered 2020-03-01 (×2): 1 g via INTRAVENOUS
  Filled 2020-03-01 (×2): qty 50

## 2020-03-01 MED ORDER — DIPHENHYDRAMINE HCL 12.5 MG/5ML PO ELIX
12.5000 mg | ORAL_SOLUTION | ORAL | Status: DC | PRN
  Administered 2020-03-01: 12.5 mg via ORAL
  Filled 2020-03-01: qty 5

## 2020-03-01 SURGICAL SUPPLY — 40 items
BAG ZIPLOCK 12X15 (MISCELLANEOUS) IMPLANT
BENZOIN TINCTURE PRP APPL 2/3 (GAUZE/BANDAGES/DRESSINGS) IMPLANT
BLADE SAW SGTL 18X1.27X75 (BLADE) ×2 IMPLANT
COVER PERINEAL POST (MISCELLANEOUS) ×2 IMPLANT
COVER SURGICAL LIGHT HANDLE (MISCELLANEOUS) ×2 IMPLANT
COVER WAND RF STERILE (DRAPES) ×2 IMPLANT
CUP SECTOR GRIPTON 50MM (Cup) ×2 IMPLANT
DRAPE STERI IOBAN 125X83 (DRAPES) ×2 IMPLANT
DRAPE U-SHAPE 47X51 STRL (DRAPES) ×4 IMPLANT
DRSG AQUACEL AG ADV 3.5X10 (GAUZE/BANDAGES/DRESSINGS) ×2 IMPLANT
DURAPREP 26ML APPLICATOR (WOUND CARE) ×2 IMPLANT
ELECT REM PT RETURN 15FT ADLT (MISCELLANEOUS) ×2 IMPLANT
GAUZE XEROFORM 1X8 LF (GAUZE/BANDAGES/DRESSINGS) ×2 IMPLANT
GLOVE BIO SURGEON STRL SZ7.5 (GLOVE) ×2 IMPLANT
GLOVE BIOGEL PI IND STRL 8 (GLOVE) ×2 IMPLANT
GLOVE BIOGEL PI INDICATOR 8 (GLOVE) ×2
GLOVE ECLIPSE 8.0 STRL XLNG CF (GLOVE) ×2 IMPLANT
GOWN STRL REUS W/TWL XL LVL3 (GOWN DISPOSABLE) ×4 IMPLANT
HANDPIECE INTERPULSE COAX TIP (DISPOSABLE) ×1
HEAD FEM STD 32X+5 STRL (Hips) ×2 IMPLANT
HOLDER FOLEY CATH W/STRAP (MISCELLANEOUS) ×2 IMPLANT
KIT TURNOVER KIT A (KITS) IMPLANT
LINER ACETABULAR 32X50 (Liner) ×2 IMPLANT
PACK ANTERIOR HIP CUSTOM (KITS) ×2 IMPLANT
PENCIL SMOKE EVACUATOR (MISCELLANEOUS) IMPLANT
SET HNDPC FAN SPRY TIP SCT (DISPOSABLE) ×1 IMPLANT
STAPLER VISISTAT 35W (STAPLE) ×2 IMPLANT
STEM FEMORAL SZ5 HIGH ACTIS (Stem) ×2 IMPLANT
STRIP CLOSURE SKIN 1/2X4 (GAUZE/BANDAGES/DRESSINGS) IMPLANT
SUT ETHIBOND NAB CT1 #1 30IN (SUTURE) ×2 IMPLANT
SUT ETHILON 2 0 PS N (SUTURE) IMPLANT
SUT MNCRL AB 4-0 PS2 18 (SUTURE) IMPLANT
SUT VIC AB 0 CT1 36 (SUTURE) ×2 IMPLANT
SUT VIC AB 1 CT1 36 (SUTURE) ×2 IMPLANT
SUT VIC AB 2-0 CT1 27 (SUTURE) ×2
SUT VIC AB 2-0 CT1 TAPERPNT 27 (SUTURE) ×2 IMPLANT
TRAY FOL W/BAG SLVR 16FR STRL (SET/KITS/TRAYS/PACK) ×1 IMPLANT
TRAY FOLEY MTR SLVR 16FR STAT (SET/KITS/TRAYS/PACK) IMPLANT
TRAY FOLEY W/BAG SLVR 16FR LF (SET/KITS/TRAYS/PACK) ×1
YANKAUER SUCT BULB TIP 10FT TU (MISCELLANEOUS) ×2 IMPLANT

## 2020-03-01 NOTE — Op Note (Signed)
NAMEKAIELLE, RESTA MEDICAL RECORD [id number] ACCOUNT [id number] DATE OF BIRTH:[birth year] FACILITY: [facility] LOCATION: [facility] PHYSICIAN:Lakeithia Rasor Kerry Fort, MD  OPERATIVE REPORT  DATE OF PROCEDURE:  03/01/2020  PREOPERATIVE DIAGNOSIS:  Severe end-stage arthritis and degenerative joint disease, left hip.  POSTOPERATIVE DIAGNOSIS:  Severe end-stage arthritis and degenerative joint disease, left hip.  PROCEDURE:  Left total hip arthroplasty through direct anterior approach.  IMPLANTS:  DePuy Sector Gription acetabular component size 50, size 32+0 neutral polyethylene liner, size 5 Actis femoral component with high offset, size 32+5 metal hip ball.  SURGEON:  Lind Guest. Ninfa Linden, MD  ASSISTANT:  Erskine Emery, PA-C  ANESTHESIA: 1.  Attempted spinal. 2.  General.  ESTIMATED BLOOD LOSS:  400 mL.  COMPLICATIONS:  None.  INDICATIONS:  The patient is a very pleasant 69 year old female that I have followed for some now.  She has debilitating arthritis involving her left hip.  The x-rays show complete loss of the joint space.  Her pain is daily and is detrimentally  affecting her mobility, her quality of life, and her activities of daily living.  She is morbidly obese with a BMI of 42.  Fortunately, she is not preoperatively from having the surgery.  I have examined her extensively and she has lost about 30 pounds  of weight.  I felt comfortable proceeding with anterior hip surgery based on what I see in the office as far as assessing her anatomy and how we can get to this hip.  I think she has done everything she can do to lose weight and I think having a hip  replacement with hopefully help her still attain her goals since she is focusing significant weight loss.  We had a long and thorough discussion about the surgery.  We talked about the risk of acute blood loss anemia, nerve or vessel injury, fracture,  infection, dislocation, DVT and implant failure.  We talked about all of  these goals being heightened given her morbid obesity.  She is not a diabetic.  We talked about our goals being decreased pain, improved mobility, and overall improved quality of  life.  DESCRIPTION OF PROCEDURE:  After informed consent was obtained, appropriate left hip was marked.  She was brought to the operating room and they sat her up on a stretcher where spinal anesthesia was attempted.  They were not able to obtain this so she  was laid in supine position on a stretcher.  General anesthesia was obtained.  A Foley catheter was placed.  I was able to assess her leg length and although she looks shorter on her plain film standing, when I laid her down clinically, her leg lengths  are equal and this needs to be a good candidate for intraoperative x-rays.  I placed traction boots on her feet and placed her supine on the Hana fracture table, the perineal post in place and both legs in line with skeletal traction device and no  traction applied.  I then assessed her hip again radiographically, so we could get a good starting point.  The left hip was prepped and draped with DuraPrep and sterile drapes.  A time-out was called.  She was identified as the correct patient, correct  left hip.  I then made an incision just inferior and posterior to the anterior iliac spine and carried this slightly obliquely down the leg.  I dissected down tensor fascia lata muscle.  Tensor fascia was then divided longitudinally to proceed with  direct anterior approach to the hip.  We identified and cauterized circumflex vessels and identified the hip capsule, opened up the hip capsule in an L-type format finding a moderate joint effusion and significant periarticular osteophytes around the  lateral femoral neck and head.  I placed a Cobra retractor around the medial and lateral femoral neck and then made our femoral neck cut with an oscillating saw just proximal to the lesser trochanter and I completed this with an osteotome.   I placed a  corkscrew guide in the femoral head and removed the femoral head in its entirety and there was a wide area devoid of cartilage at the weightbearing surface.  I then placed a bent Hohmann over the medial acetabular rim and removed remnants of the  acetabular labrum and other debris.  I then began reaming under direct visualization from a size 44 reamer and just then going to a 46 and a 49 with placing all reamers under direct visualization.  The last reamer was placed under direct fluoroscopy, so  I could obtain my depth of reaming by inclination and anteversion.  I then placed the real DePuy Sector Gription acetabular component size 50 and assessed it fluoroscopically.  I was pleased with the placement.  I went with a 32+0 neutral polyethylene  liner.  Attention was then turned to the femur.  With the leg externally rotated to 120 degrees, extended and adducted I was able to place a Mueller retractor medially and Hohman retractor behind the greater trochanter, released lateral joint capsule and  used a box-cutting osteotome to enter the femoral canal and a rongeur to lateralize and then began broaching using the Actis broaching system from a size zero going up to a size 5.  With a size 5 in place, we trialed a standard offset femoral neck and a  32+1 hip ball.  We brought the leg back over and up and with traction, internal rotation, reducing the pelvis.  Based on radiographic assessment, she definitely needed more offset and leg length.  This was also based on the clinical assessment rotating  the hip.  We dislocated the hip and removed the trial components.  I then went with a real size 5 Actis femoral component, but we went with high offset.  I then went with a metal 32+5 hip ball.  Reduced this in the acetabulum and clinically, I was very  pleased with the range of motion and stability and then assessing it radiographically, I felt like we had accomplished what we wanted to accomplish with  her offset and leg lengths.  We then irrigated the soft tissue with normal saline solution using  pulsatile lavage.  I closed the joint capsule with interrupted #1 Ethibond suture.  A #1 Vicryl was used to close the tensor fascia, 0 Vicryl was used to close deep tissue and 2-0 Vicryl was used to close subcutaneous tissue.  The skin was reapproximated  with staples.  Xeroform and Aquacel dressing was applied.  She was taken off the Hana table and awakened, extubated, and taken to recovery room in stable condition.  All final counts were correct.  There were no complications noted.  Of note, Benita Stabile,  PA-C, assisted during the entire case.  His assistance was crucial for facilitating every aspect of this case.  CN/NUANCE  D:03/01/2020 T:03/01/2020 JOB:[id number]

## 2020-03-01 NOTE — Anesthesia Procedure Notes (Signed)
Procedure Name: Intubation Date/Time: 03/01/2020 7:29 AM Performed by: Eben Burow, CRNA Pre-anesthesia Checklist: Patient identified, Emergency Drugs available, Suction available, Patient being monitored and Timeout performed Patient Re-evaluated:Patient Re-evaluated prior to induction Oxygen Delivery Method: Circle system utilized Preoxygenation: Pre-oxygenation with 100% oxygen Induction Type: IV induction Ventilation: Mask ventilation without difficulty Laryngoscope Size: Mac and 4 Tube type: Oral Number of attempts: 1 Airway Equipment and Method: Stylet Placement Confirmation: ETT inserted through vocal cords under direct vision,  positive ETCO2 and breath sounds checked- equal and bilateral Secured at: 22 cm Tube secured with: Tape Dental Injury: Teeth and Oropharynx as per pre-operative assessment

## 2020-03-01 NOTE — Plan of Care (Signed)
  Problem: Education: Goal: Knowledge of General Education information will improve Description: Including pain rating scale, medication(s)/side effects and non-pharmacologic comfort measures Outcome: Progressing   Problem: Health Behavior/Discharge Planning: Goal: Ability to manage health-related needs will improve Outcome: Progressing   Problem: Clinical Measurements: Goal: Ability to maintain clinical measurements within normal limits will improve Outcome: Progressing Goal: Will remain free from infection Outcome: Progressing Goal: Diagnostic test results will improve Outcome: Progressing Goal: Respiratory complications will improve Outcome: Progressing Goal: Cardiovascular complication will be avoided Outcome: Progressing   Problem: Activity: Goal: Risk for activity intolerance will decrease Outcome: Progressing   Problem: Nutrition: Goal: Adequate nutrition will be maintained Outcome: Progressing   Problem: Coping: Goal: Level of anxiety will decrease Outcome: Progressing   Problem: Elimination: Goal: Will not experience complications related to bowel motility Outcome: Progressing Goal: Will not experience complications related to urinary retention Outcome: Progressing   Problem: Pain Managment: Goal: General experience of comfort will improve Outcome: Progressing   Problem: Safety: Goal: Ability to remain free from injury will improve Outcome: Progressing   Problem: Skin Integrity: Goal: Risk for impaired skin integrity will decrease Outcome: Progressing   Problem: Safety: Goal: Ability to remain free from injury will improve Outcome: Progressing   Problem: Education: Goal: Knowledge of the prescribed therapeutic regimen will improve Outcome: Progressing Goal: Understanding of discharge needs will improve Outcome: Progressing Goal: Individualized Educational Video(s) Outcome: Progressing   Problem: Activity: Goal: Ability to avoid complications of  mobility impairment will improve Outcome: Progressing Goal: Ability to tolerate increased activity will improve Outcome: Progressing   Problem: Clinical Measurements: Goal: Postoperative complications will be avoided or minimized Outcome: Progressing   Problem: Pain Management: Goal: Pain level will decrease with appropriate interventions Outcome: Progressing   Problem: Skin Integrity: Goal: Will show signs of wound healing Outcome: Progressing

## 2020-03-01 NOTE — Evaluation (Signed)
Physical Therapy Evaluation Patient Details Name: Bonnie [name] MRN: [id number] DOB: Oct 28, 1951 Today's Date: 03/01/2020   History of Present Illness  Patient is 69 y.o. female s/p Lt THA anterior approach on 03/01/20 with PMH significant for OA, osteoporosis, melanoma, HTN, DM, depression, anxiety, CKD, lumbar fusion.    Clinical Impression  Bonnie [name] is a 69 y.o. female POD 0 s/p Lt THA anterior approach. Patient reports modified independence with RW and SPC for mobility prior to surgery. Patient is now limited by functional impairments (see PT problem list below) and requires min assist for transfers and gait with RW. Patient was able to ambulate ~90 feet with RW and min assist. Patient instructed in exercise to facilitate ROM and circulation. Patient will benefit from continued skilled PT interventions to address impairments and progress towards PLOF. Acute PT will follow to progress mobility and stair training in preparation for safe discharge home.     Follow Up Recommendations Follow surgeon's recommendation for DC plan and follow-up therapies;Home health PT    Equipment Recommendations  None recommended by PT    Recommendations for Other Services       Precautions / Restrictions Precautions Precautions: Fall Restrictions Weight Bearing Restrictions: No LLE Weight Bearing: Weight bearing as tolerated      Mobility  Bed Mobility Overal bed mobility: Needs Assistance Bed Mobility: Supine to Sit     Supine to sit: Min assist;HOB elevated     General bed mobility comments: cues for sequencing use of bed rail and assist for Lt LE mobility, assist to raise trunk  Transfers Overall transfer level: Needs assistance Equipment used: Rolling walker (2 wheeled) Transfers: Sit to/from Stand Sit to Stand: Min assist         General transfer comment: cues for hand placement/technique with RW. assist requried to complete power up.  Ambulation/Gait Ambulation/Gait  assistance: Min guard;Min assist Gait Distance (Feet): 90 Feet Assistive device: Rolling walker (2 wheeled) Gait Pattern/deviations: Step-through pattern;Decreased weight shift to left;Trunk flexed Gait velocity: fair   General Gait Details: pt required cues to maintain safe proximity to RW and for sequencing step pattern. pt allowing RW to get too far ahead intermittently and assist required to reposition.  Stairs            Wheelchair Mobility    Modified Rankin (Stroke Patients Only)       Balance Overall balance assessment: Needs assistance Sitting-balance support: Feet supported Sitting balance-Leahy Scale: Good     Standing balance support: Bilateral upper extremity supported;During functional activity Standing balance-Leahy Scale: Fair              Pertinent Vitals/Pain Pain Assessment: No/denies pain    Home Living Family/patient expects to be discharged to:: Private residence Living Arrangements: Spouse/significant other Available Help at Discharge: Family Type of Home: House Home Access: Level entry     Home Layout: One level Home Equipment: Bedside commode;Walker - 2 wheels;Cane - single point      Prior Function Level of Independence: Independent with assistive device(s)         Comments: Pt has been using RW for last 2 weeks due to pain, was using SPC prior to that.     Hand Dominance   Dominant Hand: Right    Extremity/Trunk Assessment   Upper Extremity Assessment Upper Extremity Assessment: Overall WFL for tasks assessed    Lower Extremity Assessment Lower Extremity Assessment: Overall WFL for tasks assessed    Cervical / Trunk Assessment Cervical / Trunk Assessment: Normal;Other  exceptions Cervical / Trunk Exceptions: history of 7 level fusion in back  Communication   Communication: No difficulties  Cognition Arousal/Alertness: Awake/alert Behavior During Therapy: WFL for tasks assessed/performed Overall Cognitive  Status: Within Functional Limits for tasks assessed               General Comments      Exercises Total Joint Exercises Ankle Circles/Pumps: AROM;Both;10 reps;Seated Quad Sets: AROM;Left;5 reps;Seated Heel Slides: AROM;Left;5 reps;Seated   Assessment/Plan    PT Assessment Patient needs continued PT services  PT Problem List Decreased strength;Decreased range of motion;Decreased activity tolerance;Decreased balance;Decreased mobility;Decreased knowledge of use of DME       PT Treatment Interventions Gait training;DME instruction;Functional mobility training;Stair training;Therapeutic activities;Therapeutic exercise;Balance training;Patient/family education    PT Goals (Current goals can be found in the Care Plan section)  Acute Rehab PT Goals Patient Stated Goal: to walk without pain PT Goal Formulation: With patient Time For Goal Achievement: 03/08/20 Potential to Achieve Goals: Good    Frequency 7X/week    AM-PAC PT "6 Clicks" Mobility  Outcome Measure Help needed turning from your back to your side while in a flat bed without using bedrails?: A Little Help needed moving from lying on your back to sitting on the side of a flat bed without using bedrails?: A Little Help needed moving to and from a bed to a chair (including a wheelchair)?: A Little Help needed standing up from a chair using your arms (e.g., wheelchair or bedside chair)?: A Little Help needed to walk in hospital room?: A Little Help needed climbing 3-5 steps with a railing? : A Little 6 Click Score: 18    End of Session Equipment Utilized During Treatment: Gait belt Activity Tolerance: Patient tolerated treatment well Patient left: in chair;with call bell/phone within reach;with chair alarm set Nurse Communication: Mobility status PT Visit Diagnosis: Muscle weakness (generalized) (M62.81);Difficulty in walking, not elsewhere classified (R26.2)    Time: [id number] PT Time Calculation (min) (ACUTE  ONLY): 26 min   Charges:   PT Evaluation $PT Eval Low Complexity: 1 Low PT Treatments $Therapeutic Exercise: 8-22 mins        Verner Mould, DPT Physical Therapist with [facility] [phone]  03/01/2020 2:26 PM

## 2020-03-01 NOTE — Brief Op Note (Signed)
03/01/2020  8:35 AM  PATIENT:  Bonnie [name]  69 y.o. female  PRE-OPERATIVE DIAGNOSIS:  left hip endstage osteoarthritis  POST-OPERATIVE DIAGNOSIS:  left hip endstage osteoarthritis  PROCEDURE:  Procedure(s): LEFT TOTAL HIP ARTHROPLASTY ANTERIOR APPROACH (Left)  SURGEON:  Surgeon(s) and Role:    Mcarthur Rossetti, MD - Primary  PHYSICIAN ASSISTANT: Benita Stabile, PA-C  ANESTHESIA:   spinal and general  EBL:  400 mL   COUNTS:  YES  DICTATION: .Other Dictation: Dictation Number [id number]  PLAN OF CARE: Admit to inpatient   PATIENT DISPOSITION:  PACU - hemodynamically stable.   Delay start of Pharmacological VTE agent (>24hrs) due to surgical blood loss or risk of bleeding: no

## 2020-03-01 NOTE — H&P (Signed)
TOTAL HIP ADMISSION H&P  Patient is admitted for left total hip arthroplasty.  Subjective:  Chief Complaint: left hip pain  HPI: Bonnie [name], 69 y.o. female, has a history of pain and functional disability in the left hip(s) due to arthritis and patient has failed non-surgical conservative treatments for greater than 12 weeks to include NSAID's and/or analgesics, flexibility and strengthening excercises, use of assistive devices, weight reduction as appropriate and activity modification.  Onset of symptoms was gradual starting 2 years ago with gradually worsening course since that time.The patient noted no past surgery on the left hip(s).  Patient currently rates pain in the left hip at 10 out of 10 with activity. Patient has night pain, worsening of pain with activity and weight bearing, trendelenberg gait, pain that interfers with activities of daily living, pain with passive range of motion and crepitus. Patient has evidence of subchondral cysts, subchondral sclerosis, periarticular osteophytes and joint space narrowing by imaging studies. This condition presents safety issues increasing the risk of falls.  There is no current active infection.  Patient Active Problem List   Diagnosis Date Noted  . Unilateral primary osteoarthritis, left hip 03/01/2020  . Acute kidney injury superimposed on CKD ([place]) 09/17/2019  . Insomnia secondary to anxiety 09/17/2019  . Increase in serum creatinine from prior measurement 09/17/2019  . Abnormal nuclear stress test 09/04/2019  . Mild diastolic dysfunction [id number]  . Right bundle branch block 07/05/2019  . Left axis deviation 07/05/2019  . Decreased exercise tolerance 07/05/2019  . Sphincter of Oddi dysfunction 07/05/2019  . Multiple pulmonary nodules determined by computed tomography of lung 07/05/2019  . Abnormal QT interval present on electrocardiogram 07/05/2019  . Irregular heart beats 06/29/2019  . Family history of CHF (congestive heart  failure) 06/29/2019  . Recurrent major depressive disorder, in partial remission ([place]) 06/12/2019  . Encounter for diabetic foot exam ([place]) 06/12/2019  . Grief reaction 05/18/2019  . History of colon polyps 01/17/2019  . Encounter for long-term (current) use of high-risk medication 01/17/2019  . Primary osteoarthritis of left hip 12/19/2018  . Left hip pain 10/13/2018  . Centrilobular emphysema ([place]) 07/25/2018  . Closed compression fracture of body of L1 vertebra ([place]) 07/25/2018  . Renal cyst, acquired, left 07/25/2018  . Primary osteoarthritis involving multiple joints 07/06/2018  . Diabetic eye exam ([place]) 07/06/2018  . History of multiple pulmonary nodules 07/06/2018  . Encounter for screening for lung cancer 07/06/2018  . Type 2 diabetes mellitus with hyperglycemia, without long-term current use of insulin ([place]) 04/04/2018  . Dyslipidemia associated with type 2 diabetes mellitus ([place]) 04/04/2018  . Class 3 severe obesity due to excess calories with serious comorbidity and body mass index (BMI) of 40.0 to 44.9 in adult ([place]) 04/03/2018  . Prediabetes 04/03/2018  . Encounter for weight loss counseling 04/03/2018  . Onychomycosis 03/28/2018  . Morbid obesity ([place]) 02/28/2018  . Bilateral foot pain 02/28/2018  . Benign cyst of right breast 01/26/2018  . Abnormal mammogram of right breast 01/20/2018  . Chronic kidney disease, stage 3a 12/31/2017  . Encounter for chronic pain management 12/30/2017  . Chronic use of opiate for therapeutic purpose 12/30/2017  . History of melanoma 12/30/2017  . History of renal cell carcinoma 12/30/2017  . Arthritis of carpometacarpal [facility]) joint of left thumb 05/19/2017  . Controlled substance agreement signed 03/30/2017  . H/O acute pancreatitis 07/17/2016  . History of biliary stent insertion 07/17/2016  . Coronary artery calcification seen on CT scan 11/08/2015  . History of  back surgery 09/23/2015  . Spondylosis of lumbar region without  myelopathy or radiculopathy 08/30/2015  . Primary osteoarthritis of both knees 03/01/2015  . Cellulitis of face 02/19/2015  . Anxiety 07/24/2014  . Basal cell carcinoma 10/04/2013  . Depression, major 10/04/2013  . Squamous cell carcinoma 10/04/2013  . Dyspnea on exertion -> considered to be possible anginal equivalent 04/20/2012  . Essential hypertension 03/29/2012  . Former smoker 03/29/2012  . Gout 03/29/2012  . Hyperlipidemia 03/29/2012  . Palpitations 03/29/2012  . OSA on CPAP 03/29/2012   Past Medical History:  Diagnosis Date  . Abnormal mammogram of right breast 01/20/2018  . Anxiety   . Benign cyst of right breast 01/26/2018  . Blood transfusion without reported diagnosis   . Cataract   . Centrilobular emphysema ([place]) 07/25/2018   mild  . 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 Chronic low back pain   . Colon polyps   . Depression   . Diabetes mellitus without complication ([place])   . Emphysema of lung ([place])   . Fatty liver   . Hypertension   . Melanoma ([place])    R upper extremity  . Melanoma in situ of right upper extremity ([place])   . Mild diastolic dysfunction 0000000  . Osteoarthritis of lumbar spine   . Osteoporosis   . Renal cancer ([place])   . Skin cancer   . Sleep apnea 2008   cpap nightly  . Sphincter of Oddi dysfunction    bile duct    Past Surgical History:  Procedure Laterality Date  . ABDOMINAL HYSTERECTOMY    . ANKLE SURGERY Left    in her 66s  . BILE DUCT STENT PLACEMENT    . CARDIAC CATHETERIZATION    . CHOLECYSTECTOMY    . COLONOSCOPY    . LEFT HEART CATH AND CORONARY ANGIOGRAPHY N/A 09/05/2019   Procedure: LEFT HEART CATH AND CORONARY ANGIOGRAPHY;  Surgeon: Leonie Man, MD;  Location: [facility];  Service: Cardiovascular;  Laterality: N/A;  . LUMBAR FUSION     7 level   . MOHS SURGERY     x 11  . PARTIAL NEPHRECTOMY    . POLYPECTOMY    . UPPER GASTROINTESTINAL ENDOSCOPY      Current Facility-Administered  Medications  Medication Dose Route Frequency Provider Last Rate Last Admin  . ceFAZolin (ANCEF) 3 g in dextrose 5 % 50 mL IVPB  3 g Intravenous Once Mcarthur Rossetti, MD      . chlorhexidine (HIBICLENS) 4 % liquid 4 application  60 mL Topical Once Pete Pelt, PA-C      . lactated ringers infusion   Intravenous Continuous Barnet Glasgow, MD 20 mL/hr at 03/01/20 0607 New Bag at 03/01/20 [id number]  . tranexamic acid (CYKLOKAPRON) IVPB 1,000 mg  1,000 mg Intravenous To OR Pete Pelt, PA-C       Allergies  Allergen Reactions  . Latex Itching and Rash  . Metformin And Related Other (See Comments)    Malaise, fatigue  . Morphine And Related Rash and Other (See Comments)    Agitation  . Tape Swelling and Other (See Comments)    Burning sensation  . Tramadol Palpitations    Heart fluttering    Social History   Tobacco Use  . Smoking status: Former Smoker    Packs/day: 1.00    Years: 40.00    Pack years: 40.00    Types: Cigarettes    Quit date: 12/21/2010  Years since quitting: 9.2  . Smokeless tobacco: Never Used  Substance Use Topics  . Alcohol use: Yes    Alcohol/week: 1.0 - 2.0 standard drinks    Types: 1 - 2 Standard drinks or equivalent per week    Family History  Problem Relation Age of Onset  . Depression Mother   . Hyperlipidemia Mother   . Hypertension Mother   . Colon polyps Mother   . Heart attack Father   . Hyperlipidemia Father   . Hypertension Father   . Colon polyps Father   . Alcohol abuse Sister   . Depression Sister   . Alcohol abuse Son   . Alcohol abuse Son   . Alcohol abuse Sister   . Depression Sister   . Stomach cancer Brother   . Colon cancer Neg Hx   . Esophageal cancer Neg Hx   . Rectal cancer Neg Hx      Review of Systems  Musculoskeletal: Positive for joint swelling.  All other systems reviewed and are negative.   Objective:  Physical Exam  Constitutional: She is oriented to person, place, and time. She appears  well-developed and well-nourished.  HENT:  Head: Normocephalic and atraumatic.  Eyes: Pupils are equal, round, and reactive to light. EOM are normal.  Cardiovascular: Normal rate.  Respiratory: Effort normal.  GI: Soft.  Musculoskeletal:     Cervical back: Normal range of motion and neck supple.     Left hip: Tenderness and bony tenderness present. Decreased range of motion. Decreased strength.  Neurological: She is alert and oriented to person, place, and time.  Skin: Skin is warm and dry.  Psychiatric: She has a normal mood and affect.    Vital signs in last 24 hours: Temp:  [98.3 F (36.8 C)] 98.3 F (36.8 C) (03/12 0558) Pulse Rate:  [94] 94 (03/12 0558) Resp:  [18] 18 (03/12 0558) BP: (130)/(85) 130/85 (03/12 0558) SpO2:  [97 %] 97 % (03/12 0558) Weight:  [132 kg] 132 kg (03/12 0603)  Labs:   Estimated body mass index is 42.97 kg/m as calculated from the following:   Height as of this encounter: 5\' 9"  (1.753 m).   Weight as of this encounter: 132 kg.   Imaging Review Plain radiographs demonstrate severe degenerative joint disease of the left hip(s). The bone quality appears to be good for age and reported activity level.      Assessment/Plan:  End stage arthritis, left hip(s)  The patient history, physical examination, clinical judgement of the provider and imaging studies are consistent with end stage degenerative joint disease of the left hip(s) and total hip arthroplasty is deemed medically necessary. The treatment options including medical management, injection therapy, arthroscopy and arthroplasty were discussed at length. The risks and benefits of total hip arthroplasty were presented and reviewed. The risks due to aseptic loosening, infection, stiffness, dislocation/subluxation,  thromboembolic complications and other imponderables were discussed.  The patient acknowledged the explanation, agreed to proceed with the plan and consent was signed. Patient is  being admitted for inpatient treatment for surgery, pain control, PT, OT, prophylactic antibiotics, VTE prophylaxis, progressive ambulation and ADL's and discharge planning.The patient is planning to be discharged home with home health services

## 2020-03-01 NOTE — Anesthesia Postprocedure Evaluation (Signed)
Anesthesia Post Note  Patient: Bonnie [name]  Procedure(s) Performed: LEFT TOTAL HIP ARTHROPLASTY ANTERIOR APPROACH (Left Hip)     Patient location during evaluation: PACU Anesthesia Type: Spinal Level of consciousness: awake and alert Pain management: pain level controlled Vital Signs Assessment: post-procedure vital signs reviewed and stable Respiratory status: spontaneous breathing, nonlabored ventilation, respiratory function stable and patient connected to nasal cannula oxygen Cardiovascular status: blood pressure returned to baseline and stable Postop Assessment: no apparent nausea or vomiting Anesthetic complications: no    Last Vitals:  Vitals:   03/01/20 0957 03/01/20 1008  BP: 120/64 130/71  Pulse: 85 81  Resp: 18 16  Temp: 36.6 C 36.8 C  SpO2: (!) 89% 99%    Last Pain:  Vitals:   03/01/20 1008  TempSrc: Oral  PainSc:                  Barnet Glasgow

## 2020-03-01 NOTE — Anesthesia Procedure Notes (Signed)
Procedure Name: MAC Date/Time: 03/01/2020 7:16 AM Performed by: Eben Burow, CRNA Pre-anesthesia Checklist: Patient identified, Emergency Drugs available, Suction available, Patient being monitored and Timeout performed Oxygen Delivery Method: Simple face mask Dental Injury: Teeth and Oropharynx as per pre-operative assessment

## 2020-03-01 NOTE — Transfer of Care (Signed)
Immediate Anesthesia Transfer of Care Note  Patient: Bonnie [name]  Procedure(s) Performed: LEFT TOTAL HIP ARTHROPLASTY ANTERIOR APPROACH (Left Hip)  Patient Location: PACU  Anesthesia Type:General  Level of Consciousness: awake, alert  and oriented  Airway & Oxygen Therapy: Patient Spontanous Breathing and Patient connected to face mask oxygen  Post-op Assessment: Report given to RN and Post -op Vital signs reviewed and stable  Post vital signs: Reviewed and stable  Last Vitals:  Vitals Value Taken Time  BP 133/66 03/01/20 0901  Temp    Pulse 87 03/01/20 0904  Resp 15 03/01/20 0904  SpO2 93 % 03/01/20 0904  Vitals shown include unvalidated device data.  Last Pain:  Vitals:   03/01/20 0603  TempSrc:   PainSc: 6          Complications: No apparent anesthesia complications

## 2020-03-02 DIAGNOSIS — Z6841 Body Mass Index (BMI) 40.0 and over, adult: Secondary | ICD-10-CM | POA: Diagnosis not present

## 2020-03-02 DIAGNOSIS — Z888 Allergy status to other drugs, medicaments and biological substances status: Secondary | ICD-10-CM | POA: Diagnosis not present

## 2020-03-02 DIAGNOSIS — Z885 Allergy status to narcotic agent status: Secondary | ICD-10-CM | POA: Diagnosis not present

## 2020-03-02 DIAGNOSIS — M109 Gout, unspecified: Secondary | ICD-10-CM | POA: Diagnosis present

## 2020-03-02 DIAGNOSIS — J432 Centrilobular emphysema: Secondary | ICD-10-CM | POA: Diagnosis present

## 2020-03-02 DIAGNOSIS — Z981 Arthrodesis status: Secondary | ICD-10-CM | POA: Diagnosis not present

## 2020-03-02 DIAGNOSIS — F329 Major depressive disorder, single episode, unspecified: Secondary | ICD-10-CM | POA: Diagnosis present

## 2020-03-02 DIAGNOSIS — D62 Acute posthemorrhagic anemia: Secondary | ICD-10-CM | POA: Diagnosis not present

## 2020-03-02 DIAGNOSIS — E1169 Type 2 diabetes mellitus with other specified complication: Secondary | ICD-10-CM | POA: Diagnosis present

## 2020-03-02 DIAGNOSIS — Y838 Other surgical procedures as the cause of abnormal reaction of the patient, or of later complication, without mention of misadventure at the time of the procedure: Secondary | ICD-10-CM | POA: Diagnosis not present

## 2020-03-02 DIAGNOSIS — Z85528 Personal history of other malignant neoplasm of kidney: Secondary | ICD-10-CM | POA: Diagnosis not present

## 2020-03-02 DIAGNOSIS — Z85820 Personal history of malignant melanoma of skin: Secondary | ICD-10-CM | POA: Diagnosis not present

## 2020-03-02 DIAGNOSIS — Z8249 Family history of ischemic heart disease and other diseases of the circulatory system: Secondary | ICD-10-CM | POA: Diagnosis not present

## 2020-03-02 DIAGNOSIS — I129 Hypertensive chronic kidney disease with stage 1 through stage 4 chronic kidney disease, or unspecified chronic kidney disease: Secondary | ICD-10-CM | POA: Diagnosis present

## 2020-03-02 DIAGNOSIS — Z91040 Latex allergy status: Secondary | ICD-10-CM | POA: Diagnosis not present

## 2020-03-02 DIAGNOSIS — N1831 Chronic kidney disease, stage 3a: Secondary | ICD-10-CM | POA: Diagnosis present

## 2020-03-02 DIAGNOSIS — I2510 Atherosclerotic heart disease of native coronary artery without angina pectoris: Secondary | ICD-10-CM | POA: Diagnosis present

## 2020-03-02 DIAGNOSIS — Z87891 Personal history of nicotine dependence: Secondary | ICD-10-CM | POA: Diagnosis not present

## 2020-03-02 DIAGNOSIS — E1122 Type 2 diabetes mellitus with diabetic chronic kidney disease: Secondary | ICD-10-CM | POA: Diagnosis present

## 2020-03-02 DIAGNOSIS — Z8349 Family history of other endocrine, nutritional and metabolic diseases: Secondary | ICD-10-CM | POA: Diagnosis not present

## 2020-03-02 DIAGNOSIS — Z7984 Long term (current) use of oral hypoglycemic drugs: Secondary | ICD-10-CM | POA: Diagnosis not present

## 2020-03-02 DIAGNOSIS — G4733 Obstructive sleep apnea (adult) (pediatric): Secondary | ICD-10-CM | POA: Diagnosis present

## 2020-03-02 DIAGNOSIS — M1612 Unilateral primary osteoarthritis, left hip: Secondary | ICD-10-CM | POA: Diagnosis present

## 2020-03-02 DIAGNOSIS — E785 Hyperlipidemia, unspecified: Secondary | ICD-10-CM | POA: Diagnosis present

## 2020-03-02 LAB — CBC
HCT: 29.9 % — ABNORMAL LOW (ref 36.0–46.0)
Hemoglobin: 9.1 g/dL — ABNORMAL LOW (ref 12.0–15.0)
MCH: 28.5 pg (ref 26.0–34.0)
MCHC: 30.4 g/dL (ref 30.0–36.0)
MCV: 93.7 fL (ref 80.0–100.0)
Platelets: 276 10*3/uL (ref 150–400)
RBC: 3.19 MIL/uL — ABNORMAL LOW (ref 3.87–5.11)
RDW: 13.6 % (ref 11.5–15.5)
WBC: 11 10*3/uL — ABNORMAL HIGH (ref 4.0–10.5)
nRBC: 0 % (ref 0.0–0.2)

## 2020-03-02 LAB — BASIC METABOLIC PANEL
Anion gap: 8 (ref 5–15)
BUN: 20 mg/dL (ref 8–23)
CO2: 26 mmol/L (ref 22–32)
Calcium: 8.4 mg/dL — ABNORMAL LOW (ref 8.9–10.3)
Chloride: 104 mmol/L (ref 98–111)
Creatinine, Ser: 1.12 mg/dL — ABNORMAL HIGH (ref 0.44–1.00)
GFR calc Af Amer: 58 mL/min — ABNORMAL LOW (ref >60)
GFR calc non Af Amer: 50 mL/min — ABNORMAL LOW (ref >60)
Glucose, Bld: 147 mg/dL — ABNORMAL HIGH (ref 70–99)
Potassium: 4.3 mmol/L (ref 3.5–5.1)
Sodium: 138 mmol/L (ref 135–145)

## 2020-03-02 MED ORDER — METHOCARBAMOL 500 MG PO TABS: 500.0000 mg | ORAL_TABLET | Freq: Four times a day (QID) | ORAL | 1 refills | Status: DC | PRN

## 2020-03-02 MED ORDER — ASPIRIN 81 MG PO CHEW: 81.0000 mg | CHEWABLE_TABLET | Freq: Two times a day (BID) | ORAL | 0 refills | Status: DC

## 2020-03-02 MED ORDER — ONDANSETRON 4 MG PO TBDP: 4.0000 mg | ORAL_TABLET | Freq: Three times a day (TID) | ORAL | 0 refills | Status: DC | PRN

## 2020-03-02 MED ORDER — OXYCODONE HCL 5 MG PO TABS: 5.0000 mg | ORAL_TABLET | ORAL | 0 refills | Status: DC | PRN

## 2020-03-02 NOTE — Progress Notes (Signed)
  Subjective: Bonnie [name] is a 69 y.o. female s/p left THA.  They are POD1.  Pt's pain is controlled.  Pt has ambulated with some difficulty.   She felt good about ambulating with PT and states this helped her pain. She is somewhat anxious about discharge today.    Objective: Vital signs in last 24 hours: Temp:  [97.7 F (36.5 C)-98.8 F (37.1 C)] 98.8 F (37.1 C) (03/13 0932) Pulse Rate:  [79-93] 93 (03/13 0932) Resp:  [14-18] 17 (03/13 0932) BP: (119-148)/(56-76) 129/56 (03/13 0932) SpO2:  [89 %-100 %] 96 % (03/13 0932)  Intake/Output from previous day: 03/12 0701 - 03/13 0700 In: 4205.2 [P.O.:840; I.V.:3021.8; IV Piggyback:343.3] Out: 2550 [Urine:2150; Blood:400] Intake/Output this shift: Total I/O In: 120 [P.O.:120] Out: -   Exam:  No gross blood or drainage overlying the dressing 1+ PT pulse Sensation intact distally in the left foot Able to dorsiflex and plantarflex the left foot   Labs: Recent Labs    03/02/20 0359  HGB 9.1*   Recent Labs    03/02/20 0359  WBC 11.0*  RBC 3.19*  HCT 29.9*  PLT 276   Recent Labs    03/02/20 0359  NA 138  K 4.3  CL 104  CO2 26  BUN 20  CREATININE 1.12*  GLUCOSE 147*  CALCIUM 8.4*   No results for input(s): LABPT, INR in the last 72 hours.  Assessment/Plan: Pt is POD1 s/p left THA.    -Plan to discharge to home today or tomorrow pending patient's pain and PT eval  -WBAT with a walker  -Okay to shower, dressing is waterproof.  Cautioned patient against soaking dressing in bath/pool/body of water     Donella Stade 03/02/2020, 9:48 AM

## 2020-03-02 NOTE — Discharge Instructions (Signed)

## 2020-03-02 NOTE — Progress Notes (Signed)
Physical Therapy Treatment Patient Details Name: Bonnie [name] MRN: [id number] DOB: [birth year] Today's Date: 03/02/2020    History of Present Illness Patient is 69 y.o. female s/p Lt THA anterior approach on 03/01/20 with PMH significant for OA, osteoporosis, melanoma, HTN, DM, depression, anxiety, CKD, lumbar fusion.    PT Comments    Progressing well with mobility. Moderate pain with activity. Will plan to have a 2nd session later today. Pt should be able to d/c home later today if cleared by surgeon and pt is agreeable.  Follow Up Recommendations  Follow surgeon's recommendation for DC plan and follow-up therapies;Home health PT     Equipment Recommendations  None recommended by PT    Recommendations for Other Services       Precautions / Restrictions Precautions Precautions: Fall Restrictions Weight Bearing Restrictions: No LLE Weight Bearing: Weight bearing as tolerated    Mobility  Bed Mobility Overal bed mobility: Needs Assistance Bed Mobility: Supine to Sit     Supine to sit: Min guard;HOB elevated     General bed mobility comments: Increased time. Cues for technique. Pt used gait belt as leg lifter.  Transfers Overall transfer level: Needs assistance Equipment used: Rolling walker (2 wheeled) Transfers: Sit to/from Stand Sit to Stand: Min guard;From elevated surface         General transfer comment: Close guard for safety. VCs safety, technique, hand/LE placement.  Ambulation/Gait Ambulation/Gait assistance: Min guard Gait Distance (Feet): 165 Feet Assistive device: Rolling walker (2 wheeled) Gait Pattern/deviations: Step-to pattern;Step-through pattern;Decreased step length - left;Decreased stride length     General Gait Details: Close guard for safety. VCs safety, sequence, distance from RW, posture. Fair gait speed. No ligthheadedness/dizziness   Stairs             Wheelchair Mobility    Modified Rankin (Stroke Patients Only)        Balance Overall balance assessment: Mild deficits observed, not formally tested         Standing balance support: Bilateral upper extremity supported Standing balance-Leahy Scale: Fair                              Cognition Arousal/Alertness: Awake/alert Behavior During Therapy: WFL for tasks assessed/performed Overall Cognitive Status: Within Functional Limits for tasks assessed                                        Exercises Total Joint Exercises Ankle Circles/Pumps: AROM;Both;15 reps Quad Sets: AROM;Both;15 reps Heel Slides: AAROM;Left;15 reps;Supine Hip ABduction/ADduction: AAROM;Left;15 reps;Supine    General Comments        Pertinent Vitals/Pain Pain Assessment: 0-10 Pain Score: 6  Pain Location: L hip/thigh Pain Descriptors / Indicators: Burning;Sore;Discomfort Pain Intervention(s): Monitored during session;Ice applied;Repositioned    Home Living                      Prior Function            PT Goals (current goals can now be found in the care plan section) Progress towards PT goals: Progressing toward goals    Frequency    7X/week      PT Plan Current plan remains appropriate    Co-evaluation              AM-PAC PT "6 Clicks" Mobility   Outcome Measure  Help  needed turning from your back to your side while in a flat bed without using bedrails?: A Little Help needed moving from lying on your back to sitting on the side of a flat bed without using bedrails?: A Little Help needed moving to and from a bed to a chair (including a wheelchair)?: A Little Help needed standing up from a chair using your arms (e.g., wheelchair or bedside chair)?: A Little Help needed to walk in hospital room?: A Little Help needed climbing 3-5 steps with a railing? : A Little 6 Click Score: 18    End of Session Equipment Utilized During Treatment: Gait belt Activity Tolerance: Patient tolerated treatment well Patient  left: in chair;with call bell/phone within reach   PT Visit Diagnosis: Pain;Other abnormalities of gait and mobility (R26.89) Pain - Right/Left: Left Pain - part of body: Hip     Time: [id number] PT Time Calculation (min) (ACUTE ONLY): 37 min  Charges:  $Gait Training: 8-22 mins $Therapeutic Exercise: 8-22 mins                         Doreatha Massed, PT Acute Rehabilitation

## 2020-03-02 NOTE — Progress Notes (Signed)
Physical Therapy Treatment Patient Details Name: Bonnie [name] MRN: [id number] DOB: [birth year] Today's Date: 03/02/2020    History of Present Illness Patient is 69 y.o. female s/p Lt THA anterior approach on 03/01/20 with PMH significant for OA, osteoporosis, melanoma, HTN, DM, depression, anxiety, CKD, lumbar fusion.    PT Comments    Progressing well with mobility. Plan is for d/c home on tomorrow if all goes well.    Follow Up Recommendations  Follow surgeon's recommendation for DC plan and follow-up therapies     Equipment Recommendations  None recommended by PT    Recommendations for Other Services       Precautions / Restrictions Precautions Precautions: Fall Restrictions Weight Bearing Restrictions: No LLE Weight Bearing: Weight bearing as tolerated    Mobility  Bed Mobility Overal bed mobility: Needs Assistance Bed Mobility: Sit to Supine       Sit to supine: Min guard;HOB elevated   General bed mobility comments: Increased time. Cues for technique. Pt used gait belt as leg lifter  Transfers Overall transfer level: Needs assistance Equipment used: Rolling walker (2 wheeled) Transfers: Sit to/from Stand Sit to Stand: Min guard         General transfer comment: Close guard for safety. VCs safety, technique, hand/LE placement.  Ambulation/Gait Ambulation/Gait assistance: Min guard Gait Distance (Feet): 125 Feet Assistive device: Rolling walker (2 wheeled) Gait Pattern/deviations: Step-to pattern;Step-through pattern;Decreased step length - left;Decreased stride length     General Gait Details: Close guard for safety. VCs safety, sequence, distance from RW, posture. Fair gait speed.   Stairs             Wheelchair Mobility    Modified Rankin (Stroke Patients Only)       Balance Overall balance assessment: Needs assistance         Standing balance support: Bilateral upper extremity supported Standing balance-Leahy Scale: Fair                              Cognition Arousal/Alertness: Awake/alert Behavior During Therapy: WFL for tasks assessed/performed Overall Cognitive Status: Within Functional Limits for tasks assessed                                        Exercises      General Comments        Pertinent Vitals/Pain Pain Assessment: 0-10 Pain Score: 6  Pain Location: L thigh Pain Descriptors / Indicators: Burning;Discomfort;Sore Pain Intervention(s): Monitored during session;Repositioned    Home Living                      Prior Function            PT Goals (current goals can now be found in the care plan section) Progress towards PT goals: Progressing toward goals    Frequency    7X/week      PT Plan Current plan remains appropriate    Co-evaluation              AM-PAC PT "6 Clicks" Mobility   Outcome Measure  Help needed turning from your back to your side while in a flat bed without using bedrails?: A Little Help needed moving from lying on your back to sitting on the side of a flat bed without using bedrails?: A Little Help needed moving to and from  a bed to a chair (including a wheelchair)?: A Little Help needed standing up from a chair using your arms (e.g., wheelchair or bedside chair)?: A Little Help needed to walk in hospital room?: A Little Help needed climbing 3-5 steps with a railing? : A Little 6 Click Score: 18    End of Session Equipment Utilized During Treatment: Gait belt Activity Tolerance: Patient tolerated treatment well Patient left: in bed;with call bell/phone within reach   PT Visit Diagnosis: Pain;Other abnormalities of gait and mobility (R26.89) Pain - Right/Left: Left Pain - part of body: Hip     Time: 1350-1405 PT Time Calculation (min) (ACUTE ONLY): 15 min  Charges:  $Gait Training: 8-22 mins                         Doreatha Massed, PT Acute Rehabilitation

## 2020-03-02 NOTE — TOC Progression Note (Signed)
Transition of Care [facility]) - Progression Note    Patient Details  Name: Bonnie [name] MRN: [id number] Date of Birth: 04-22-1951  [facility] CM/SW Contact  Joaquin Courts, RN Phone Number: 03/02/2020, 10:57 AM  Clinical Narrative:    CM spoke with patient at bedside. Patient is set up with [facility] at home for [place]. Reports has rolling walker and 3in1.    Expected Discharge Plan: [place] Barriers to Discharge: Continued Medical Work up  Expected Discharge Plan and Services Expected Discharge Plan: [place]   Discharge Planning Services: CM Consult Post Acute Care Choice: [place] arrangements for the past 2 months: Single Family Home                 DME Arranged: N/A DME Agency: NA       HH Arranged: PT HH Agency: [facility] [facility] [employer] Date [place]: 03/02/20 Time [place]: 1057 Representative spoke with at [place]: [place]) Interventions    Readmission Risk Interventions No flowsheet data found.

## 2020-03-02 NOTE — Progress Notes (Signed)
Patient ID: Bonnie [name], female   DOB: [birth year], 69 y.o.   MRN: [id number] The patient has some anxiety about discharge to home today.  Her vitals are stable.  She did experience acute blood loss anemia from surgery.  Will need to stay today to monitor this.  Will change to inpatient admit.  Can likely discharge to home tomorrow.

## 2020-03-02 NOTE — Plan of Care (Signed)
  Problem: Education: Goal: Knowledge of General Education information will improve Description: Including pain rating scale, medication(s)/side effects and non-pharmacologic comfort measures Outcome: Progressing   Problem: Health Behavior/Discharge Planning: Goal: Ability to manage health-related needs will improve Outcome: Progressing   Problem: Health Behavior/Discharge Planning: Goal: Ability to manage health-related needs will improve Outcome: Progressing   Problem: Clinical Measurements: Goal: Ability to maintain clinical measurements within normal limits will improve Outcome: Progressing Goal: Will remain free from infection Outcome: Progressing Goal: Diagnostic test results will improve Outcome: Progressing Goal: Cardiovascular complication will be avoided Outcome: Progressing   Problem: Activity: Goal: Risk for activity intolerance will decrease Outcome: Progressing   Problem: Nutrition: Goal: Adequate nutrition will be maintained Outcome: Progressing   Problem: Coping: Goal: Level of anxiety will decrease Outcome: Progressing   Problem: Elimination: Goal: Will not experience complications related to bowel motility Outcome: Progressing Goal: Will not experience complications related to urinary retention Outcome: Progressing   Problem: Pain Managment: Goal: General experience of comfort will improve Outcome: Progressing   Problem: Safety: Goal: Ability to remain free from injury will improve Outcome: Progressing   Problem: Skin Integrity: Goal: Risk for impaired skin integrity will decrease Outcome: Progressing   Problem: Education: Goal: Knowledge of the prescribed therapeutic regimen will improve Outcome: Progressing Goal: Understanding of discharge needs will improve Outcome: Progressing Goal: Individualized Educational Video(s) Outcome: Progressing   Problem: Activity: Goal: Ability to avoid complications of mobility impairment will  improve Outcome: Progressing Goal: Ability to tolerate increased activity will improve Outcome: Progressing   Problem: Clinical Measurements: Goal: Postoperative complications will be avoided or minimized Outcome: Progressing   Problem: Pain Management: Goal: Pain level will decrease with appropriate interventions Outcome: Progressing   Problem: Skin Integrity: Goal: Will show signs of wound healing Outcome: Progressing

## 2020-03-03 LAB — CBC
HCT: 28.3 % — ABNORMAL LOW (ref 36.0–46.0)
Hemoglobin: 8.6 g/dL — ABNORMAL LOW (ref 12.0–15.0)
MCH: 28.4 pg (ref 26.0–34.0)
MCHC: 30.4 g/dL (ref 30.0–36.0)
MCV: 93.4 fL (ref 80.0–100.0)
Platelets: 251 10*3/uL (ref 150–400)
RBC: 3.03 MIL/uL — ABNORMAL LOW (ref 3.87–5.11)
RDW: 13.7 % (ref 11.5–15.5)
WBC: 9.2 10*3/uL (ref 4.0–10.5)
nRBC: 0 % (ref 0.0–0.2)

## 2020-03-03 NOTE — Progress Notes (Signed)
Physical Therapy Treatment Patient Details Name: Bonnie [name] MRN: [id number] DOB: [birth year] Today's Date: 03/03/2020    History of Present Illness Patient is 69 y.o. female s/p Lt THA anterior approach on 03/01/20 with PMH significant for OA, osteoporosis, melanoma, HTN, DM, depression, anxiety, CKD, lumbar fusion.    PT Comments    Pt progressing well with mobility and hopeful for dc home this date.   Follow Up Recommendations  Follow surgeon's recommendation for DC plan and follow-up therapies     Equipment Recommendations  None recommended by PT    Recommendations for Other Services       Precautions / Restrictions Precautions Precautions: Fall Restrictions Weight Bearing Restrictions: No LLE Weight Bearing: Weight bearing as tolerated    Mobility  Bed Mobility Overal bed mobility: Needs Assistance Bed Mobility: Supine to Sit     Supine to sit: Supervision;HOB elevated     General bed mobility comments: Pt self cues and using belt to self assist L LE  Transfers Overall transfer level: Needs assistance Equipment used: Rolling walker (2 wheeled) Transfers: Sit to/from Stand Sit to Stand: Supervision         General transfer comment: min cues for use of UEs to self assist  Ambulation/Gait Ambulation/Gait assistance: Min guard;Supervision Gait Distance (Feet): 150 Feet Assistive device: Rolling walker (2 wheeled) Gait Pattern/deviations: Step-to pattern;Step-through pattern;Decreased step length - left;Decreased stride length;Shuffle;Trunk flexed Gait velocity: fair   General Gait Details: cues for posture and position from [employer]             Wheelchair Mobility    Modified Rankin (Stroke Patients Only)       Balance Overall balance assessment: Needs assistance Sitting-balance support: Feet supported Sitting balance-Leahy Scale: Good     Standing balance support: Bilateral upper extremity supported Standing balance-Leahy Scale:  Fair                              Cognition Arousal/Alertness: Awake/alert Behavior During Therapy: WFL for tasks assessed/performed Overall Cognitive Status: Within Functional Limits for tasks assessed                                        Exercises Total Joint Exercises Ankle Circles/Pumps: AROM;Both;15 reps Quad Sets: AROM;Both;15 reps Heel Slides: AAROM;Left;Supine;20 reps Hip ABduction/ADduction: AAROM;Left;15 reps;Supine Long Arc Quad: AROM;Left;10 reps;Seated    General Comments        Pertinent Vitals/Pain Pain Assessment: 0-10 Pain Score: 4  Pain Location: L thigh Pain Descriptors / Indicators: Burning;Discomfort;Sore Pain Intervention(s): Limited activity within patient's tolerance;Monitored during session;Premedicated before session;Ice applied    Home Living                      Prior Function            PT Goals (current goals can now be found in the care plan section) Acute Rehab PT Goals Patient Stated Goal: to walk without pain PT Goal Formulation: With patient Time For Goal Achievement: 03/08/20 Potential to Achieve Goals: Good Progress towards PT goals: Progressing toward goals    Frequency    7X/week      PT Plan Current plan remains appropriate    Co-evaluation              AM-PAC PT "6 Clicks" Mobility   Outcome Measure  Help needed turning from your back to your side while in a flat bed without using bedrails?: A Little Help needed moving from lying on your back to sitting on the side of a flat bed without using bedrails?: A Little Help needed moving to and from a bed to a chair (including a wheelchair)?: A Little Help needed standing up from a chair using your arms (e.g., wheelchair or bedside chair)?: A Little Help needed to walk in hospital room?: A Little Help needed climbing 3-5 steps with a railing? : A Little 6 Click Score: 18    End of Session Equipment Utilized During  Treatment: Gait belt Activity Tolerance: Patient tolerated treatment well Patient left: in chair;with call bell/phone within reach Nurse Communication: Mobility status PT Visit Diagnosis: Pain;Other abnormalities of gait and mobility (R26.89);Difficulty in walking, not elsewhere classified (R26.2) Pain - Right/Left: Left Pain - part of body: Hip     Time: [id number] PT Time Calculation (min) (ACUTE ONLY): 33 min  Charges:  $Gait Training: 8-22 mins $Therapeutic Exercise: 8-22 mins                     [place] Pager [phone] Office [phone]    Delani Kohli 03/03/2020, 1:24 PM

## 2020-03-03 NOTE — Plan of Care (Signed)
resolved 

## 2020-03-03 NOTE — Progress Notes (Signed)
Physical Therapy Treatment Patient Details Name: Bonnie [name] MRN: [id number] DOB: November 03, 1951 Today's Date: 03/03/2020    History of Present Illness Patient is 69 y.o. female s/p Lt THA anterior approach on 03/01/20 with PMH significant for OA, osteoporosis, melanoma, HTN, DM, depression, anxiety, CKD, lumbar fusion.    PT Comments    Pt concerned about rise in temp and fatigue but would like to ambulate "Seeing how I do will help me decide if I am going home or not".  Pt mobilizing with min difficulty and with temp of 98.4 following.  "I think I will call my husband and have him come get me".   Follow Up Recommendations  Follow surgeon's recommendation for DC plan and follow-up therapies     Equipment Recommendations  None recommended by PT    Recommendations for Other Services       Precautions / Restrictions Precautions Precautions: Fall Restrictions Weight Bearing Restrictions: No LLE Weight Bearing: Weight bearing as tolerated    Mobility  Bed Mobility Overal bed mobility: Needs Assistance Bed Mobility: Supine to Sit;Sit to Supine     Supine to sit: Supervision;HOB elevated Sit to supine: Min guard;HOB elevated   General bed mobility comments: Pt self cues and using belt to self assist L LE  Transfers Overall transfer level: Needs assistance Equipment used: Rolling walker (2 wheeled) Transfers: Sit to/from Stand Sit to Stand: Supervision         General transfer comment: min cues for use of UEs to self assist  Ambulation/Gait Ambulation/Gait assistance: Min guard;Supervision Gait Distance (Feet): 160 Feet Assistive device: Rolling walker (2 wheeled) Gait Pattern/deviations: Step-to pattern;Step-through pattern;Decreased step length - left;Decreased stride length;Shuffle;Trunk flexed Gait velocity: fair   General Gait Details: cues for posture and position from [employer]             Wheelchair Mobility    Modified Rankin (Stroke Patients  Only)       Balance Overall balance assessment: Needs assistance Sitting-balance support: Feet supported Sitting balance-Leahy Scale: Good     Standing balance support: Bilateral upper extremity supported Standing balance-Leahy Scale: Fair                              Cognition Arousal/Alertness: Awake/alert Behavior During Therapy: WFL for tasks assessed/performed Overall Cognitive Status: Within Functional Limits for tasks assessed                                        Exercises Total Joint Exercises Ankle Circles/Pumps: AROM;Both;15 reps Quad Sets: AROM;Both;15 reps Heel Slides: AAROM;Left;Supine;20 reps Hip ABduction/ADduction: AAROM;Left;15 reps;Supine Long Arc Quad: AROM;Left;10 reps;Seated    General Comments        Pertinent Vitals/Pain Pain Assessment: 0-10 Pain Score: 4  Pain Location: L thigh Pain Descriptors / Indicators: Burning;Discomfort;Sore Pain Intervention(s): Limited activity within patient's tolerance;Monitored during session;Premedicated before session;Ice applied    Home Living                      Prior Function            PT Goals (current goals can now be found in the care plan section) Acute Rehab PT Goals Patient Stated Goal: to walk without pain PT Goal Formulation: With patient Time For Goal Achievement: 03/08/20 Potential to Achieve Goals: Good Progress towards PT goals:  Progressing toward goals    Frequency    7X/week      PT Plan Current plan remains appropriate    Co-evaluation              AM-PAC PT "6 Clicks" Mobility   Outcome Measure  Help needed turning from your back to your side while in a flat bed without using bedrails?: A Little Help needed moving from lying on your back to sitting on the side of a flat bed without using bedrails?: A Little Help needed moving to and from a bed to a chair (including a wheelchair)?: A Little Help needed standing up from a chair  using your arms (e.g., wheelchair or bedside chair)?: A Little Help needed to walk in hospital room?: A Little Help needed climbing 3-5 steps with a railing? : A Little 6 Click Score: 18    End of Session Equipment Utilized During Treatment: Gait belt Activity Tolerance: Patient tolerated treatment well Patient left: in bed;with call bell/phone within reach Nurse Communication: Mobility status PT Visit Diagnosis: Pain;Other abnormalities of gait and mobility (R26.89);Difficulty in walking, not elsewhere classified (R26.2) Pain - Right/Left: Left Pain - part of body: Hip     Time: 1450-1510 PT Time Calculation (min) (ACUTE ONLY): 20 min  Charges:  $Gait Training: 8-22 mins $Therapeutic Exercise: 8-22 mins                     [place] Pager [phone] Office [phone]    Sidney Silberman 03/03/2020, 3:35 PM

## 2020-03-03 NOTE — Progress Notes (Signed)
  Subjective: Pt stable - feels poorly    Objective: Vital signs in last 24 hours: Temp:  [98.5 F (36.9 C)-99.7 F (37.6 C)] 98.6 F (37 C) (03/14 0553) Pulse Rate:  [87-97] 97 (03/14 0553) Resp:  [15-18] 17 (03/14 0553) BP: (112-140)/(55-72) 118/55 (03/14 0553) SpO2:  [90 %-97 %] 90 % (03/14 0553)  Intake/Output from previous day: 03/13 0701 - 03/14 0700 In: 1180 [P.O.:1180] Out: 150 [Urine:150] Intake/Output this shift: Total I/O In: 240 [P.O.:240] Out: -   Exam:  Intact pulses distally  Labs: Recent Labs    03/02/20 0359 03/03/20 0403  HGB 9.1* 8.6*   Recent Labs    03/02/20 0359 03/03/20 0403  WBC 11.0* 9.2  RBC 3.19* 3.03*  HCT 29.9* 28.3*  PLT 276 251   Recent Labs    03/02/20 0359  NA 138  K 4.3  CL 104  CO2 26  BUN 20  CREATININE 1.12*  GLUCOSE 147*  CALCIUM 8.4*   No results for input(s): LABPT, INR in the last 72 hours.  Assessment/Plan: hgb 8.6 - plan for dc today but may delay til am if energy level remains low   G Alphonzo Severance 03/03/2020, 10:48 AM

## 2020-03-04 ENCOUNTER — Encounter: Payer: Self-pay | Admitting: *Deleted

## 2020-03-04 ENCOUNTER — Telehealth: Payer: Self-pay | Admitting: Orthopedic Surgery

## 2020-03-04 DIAGNOSIS — N6011 Diffuse cystic mastopathy of right breast: Secondary | ICD-10-CM | POA: Diagnosis not present

## 2020-03-04 DIAGNOSIS — F329 Major depressive disorder, single episode, unspecified: Secondary | ICD-10-CM | POA: Diagnosis not present

## 2020-03-04 DIAGNOSIS — F419 Anxiety disorder, unspecified: Secondary | ICD-10-CM | POA: Diagnosis not present

## 2020-03-04 DIAGNOSIS — G8929 Other chronic pain: Secondary | ICD-10-CM | POA: Diagnosis not present

## 2020-03-04 DIAGNOSIS — N1831 Chronic kidney disease, stage 3a: Secondary | ICD-10-CM | POA: Diagnosis not present

## 2020-03-04 DIAGNOSIS — K760 Fatty (change of) liver, not elsewhere classified: Secondary | ICD-10-CM | POA: Diagnosis not present

## 2020-03-04 DIAGNOSIS — E1122 Type 2 diabetes mellitus with diabetic chronic kidney disease: Secondary | ICD-10-CM | POA: Diagnosis not present

## 2020-03-04 DIAGNOSIS — I1310 Hypertensive heart and chronic kidney disease without heart failure, with stage 1 through stage 4 chronic kidney disease, or unspecified chronic kidney disease: Secondary | ICD-10-CM | POA: Diagnosis not present

## 2020-03-04 DIAGNOSIS — J432 Centrilobular emphysema: Secondary | ICD-10-CM | POA: Diagnosis not present

## 2020-03-04 DIAGNOSIS — E1136 Type 2 diabetes mellitus with diabetic cataract: Secondary | ICD-10-CM | POA: Diagnosis not present

## 2020-03-04 DIAGNOSIS — M810 Age-related osteoporosis without current pathological fracture: Secondary | ICD-10-CM | POA: Diagnosis not present

## 2020-03-04 DIAGNOSIS — M170 Bilateral primary osteoarthritis of knee: Secondary | ICD-10-CM | POA: Diagnosis not present

## 2020-03-04 DIAGNOSIS — Z471 Aftercare following joint replacement surgery: Secondary | ICD-10-CM | POA: Diagnosis not present

## 2020-03-04 DIAGNOSIS — G4730 Sleep apnea, unspecified: Secondary | ICD-10-CM | POA: Diagnosis not present

## 2020-03-04 DIAGNOSIS — M47816 Spondylosis without myelopathy or radiculopathy, lumbar region: Secondary | ICD-10-CM | POA: Diagnosis not present

## 2020-03-04 DIAGNOSIS — K635 Polyp of colon: Secondary | ICD-10-CM | POA: Diagnosis not present

## 2020-03-04 NOTE — Telephone Encounter (Signed)
Crystal with [facility] called needing verbal orders for physical therapy.  Requesting Home Health P.T. 3 times a week for 4 weeks.  Please call Crystal at [phone]

## 2020-03-04 NOTE — Telephone Encounter (Signed)
Verbal order given  

## 2020-03-05 NOTE — Discharge Summary (Signed)
Patient ID: Bonnie [name] MRN: [id number] DOB/AGE: 08-22-1951 69 y.o.  Admit date: 03/01/2020 Discharge date: 03/05/2020  Admission Diagnoses:  Principal Problem:   Unilateral primary osteoarthritis, left hip Active Problems:   Status post total replacement of left hip   Discharge Diagnoses:  Same  Past Medical History:  Diagnosis Date  . Abnormal mammogram of right breast 01/20/2018  . Anxiety   . Benign cyst of right breast 01/26/2018  . Blood transfusion without reported diagnosis   . Cataract   . Centrilobular emphysema ([place]) 07/25/2018   mild  . 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 Chronic low back pain   . Colon polyps   . Depression   . Diabetes mellitus without complication ([place])   . Emphysema of lung ([place])   . Fatty liver   . Hypertension   . Melanoma ([place])    R upper extremity  . Melanoma in situ of right upper extremity ([place])   . Mild diastolic dysfunction 6/38/7564  . Osteoarthritis of lumbar spine   . Osteoporosis   . Renal cancer ([place])   . Skin cancer   . Sleep apnea 2008   cpap nightly  . Sphincter of Oddi dysfunction    bile duct    Surgeries: Procedure(s): LEFT TOTAL HIP ARTHROPLASTY ANTERIOR APPROACH on 03/01/2020   Consultants:   Discharged Condition: Improved  Hospital Course: Bonnie [name] is an 69 y.o. female who was admitted 03/01/2020 for operative treatment ofUnilateral primary osteoarthritis, left hip. Patient has severe unremitting pain that affects sleep, daily activities, and work/hobbies. After pre-op clearance the patient was taken to the operating room on 03/01/2020 and underwent  Procedure(s): LEFT TOTAL HIP ARTHROPLASTY ANTERIOR APPROACH.    Patient was given perioperative antibiotics:  Anti-infectives (From admission, onward)   Start     Dose/Rate Route Frequency Ordered Stop   03/01/20 1330  ceFAZolin (ANCEF) IVPB 1 g/50 mL premix     1 g 100 mL/hr over 30 Minutes Intravenous Every 6 hours 03/01/20 1023  03/01/20 2208   03/01/20 0700  ceFAZolin (ANCEF) 3 g in dextrose 5 % 50 mL IVPB     3 g 100 mL/hr over 30 Minutes Intravenous  Once 03/01/20 0651 03/01/20 0730   02/23/20 0600  ceFAZolin (ANCEF) 3 g in dextrose 5 % 50 mL IVPB     3 g 100 mL/hr over 30 Minutes Intravenous On call to O.R. 02/22/20 3329 02/24/20 0559       Patient was given sequential compression devices, early ambulation, and chemoprophylaxis to prevent DVT.  Patient benefited maximally from hospital stay and there were no complications.    Recent vital signs: No data found.   Recent laboratory studies:  Recent Labs    03/03/20 0403  WBC 9.2  HGB 8.6*  HCT 28.3*  PLT 251     Discharge Medications:   Allergies as of 03/03/2020      Reactions   Latex Itching, Rash   Metformin And Related Other (See Comments)   Malaise, fatigue   Morphine And Related Rash, Other (See Comments)   Agitation   Tape Swelling, Other (See Comments)   Burning sensation   Tramadol Palpitations   Heart fluttering      Medication List    STOP taking these medications   aspirin 81 MG tablet Replaced by: aspirin 81 MG chewable tablet   HYDROcodone-acetaminophen 5-325 MG tablet Commonly known as: NORCO/VICODIN     TAKE these medications   acetaminophen 650 MG  CR tablet Commonly known as: TYLENOL Take 1 tablet (650 mg total) by mouth every 8 (eight) hours. MAX 4G/DAY What changed:   when to take this  reasons to take this   allopurinol 100 MG tablet Commonly known as: ZYLOPRIM TAKE 1 TABLET DAILY   ALPRAZolam 0.25 MG tablet Commonly known as: XANAX Take 1-2 tablets (0.25-0.5 mg total) by mouth at bedtime as needed for anxiety or sleep. DO NOT COMBINE WITH HYDROCODONE/NORCO   aspirin 81 MG chewable tablet Chew 1 tablet (81 mg total) by mouth 2 (two) times daily. Replaces: aspirin 81 MG tablet   atorvastatin 40 MG tablet Commonly known as: LIPITOR Take 1 tablet (40 mg total) by mouth daily.   blood glucose meter  kit and supplies Kit Check morning fasting blood glucose and up to four times daily as directed.   buPROPion 150 MG 12 hr tablet Commonly known as: WELLBUTRIN SR Take 150 mg by mouth daily.   enalapril 10 MG tablet Commonly known as: VASOTEC Take 1 tablet (10 mg total) by mouth daily.   Fish Oil 1000 MG Caps Take 1,000 mg by mouth daily.   fluticasone 50 MCG/ACT nasal spray Commonly known as: FLONASE Place 2 sprays into both nostrils daily as needed for allergies or rhinitis.   methocarbamol 500 MG tablet Commonly known as: ROBAXIN Take 1 tablet (500 mg total) by mouth every 6 (six) hours as needed for muscle spasms.   Multiple Vitamin tablet Take 1 tablet by mouth daily.   ondansetron 4 MG disintegrating tablet Commonly known as: Zofran ODT Take 1 tablet (4 mg total) by mouth every 8 (eight) hours as needed for nausea or vomiting.   ONE TOUCH ULTRA TEST test strip Generic drug: glucose blood   OneTouch Delica Lancets 94B Misc   oxyCODONE 5 MG immediate release tablet Commonly known as: Oxy IR/ROXICODONE Take 1-2 tablets (5-10 mg total) by mouth every 4 (four) hours as needed for moderate pain (pain score 4-6).   Rybelsus 7 MG Tabs Generic drug: Semaglutide Take 7 mg by mouth daily.   traZODone 50 MG tablet Commonly known as: DESYREL Take 1 tablet (50 mg total) by mouth at bedtime as needed.   triamterene-hydrochlorothiazide 37.5-25 MG tablet Commonly known as: MAXZIDE-25 Take 1 tablet by mouth daily.   venlafaxine XR 75 MG 24 hr capsule Commonly known as: EFFEXOR-XR Take 1 capsule (75 mg total) by mouth daily with breakfast.   Vitamin D3 50 MCG (2000 UT) Tabs Take 2,000 Units by mouth daily.       Diagnostic Studies: DG Pelvis Portable  Result Date: 03/01/2020 CLINICAL DATA:  Postop, left hip arthroplasty EXAM: PORTABLE PELVIS 1-2 VIEWS COMPARISON:  11/27/2019 FINDINGS: Status post left hip total arthroplasty with expected overlying postoperative  changes. No evidence of perihardware fracture or component malposition. IMPRESSION: Status post left hip total arthroplasty with expected overlying postoperative changes. No evidence of perihardware fracture or component malposition. Electronically Signed   By: Eddie Candle M.D.   On: 03/01/2020 09:34   DG C-Arm 1-60 Min-No Report  Result Date: 03/01/2020 Fluoroscopy was utilized by the requesting physician.  No radiographic interpretation.   DG HIP OPERATIVE UNILAT W OR W/O PELVIS LEFT  Result Date: 03/01/2020 CLINICAL DATA:  Hip arthroplasty EXAM: OPERATIVE LEFT HIP (WITH PELVIS IF PERFORMED) 3+ VIEWS TECHNIQUE: Fluoroscopic spot image(s) were submitted for interpretation post-operatively. COMPARISON:  None. FINDINGS: Total LEFT hip arthroplasty. No fracture or dislocation. Prosthetic components well seated. IMPRESSION: No complication following total hip arthroplasty  Electronically Signed   By: Suzy Bouchard M.D.   On: 03/01/2020 09:34    Disposition: Discharge disposition: 01-Home or Self Care       Discharge Instructions    Call MD / Call 911   Complete by: As directed    If you experience chest pain or shortness of breath, CALL 911 and be transported to the hospital emergency room.  If you develope a fever above 101 F, pus (white drainage) or increased drainage or redness at the wound, or calf pain, call your surgeon's office.   Constipation Prevention   Complete by: As directed    Drink plenty of fluids.  Prune juice may be helpful.  You may use a stool softener, such as Colace (over the counter) 100 mg twice a day.  Use MiraLax (over the counter) for constipation as needed.   Diet - low sodium heart healthy   Complete by: As directed    Increase activity slowly as tolerated   Complete by: As directed       Follow-up Information    Mcarthur Rossetti, MD Follow up in 2 week(s).   Specialty: Orthopedic Surgery Contact information: [place]  [address] [phone]        [facility] Follow up.   Specialty: Home Health Services Why: agency will provide home heath physical therapy. agency will call you to schedule first visit Contact information: [place] [address] [phone]            Signed: Erskine Emery 03/05/2020, 1:50 PM

## 2020-03-06 DIAGNOSIS — M47816 Spondylosis without myelopathy or radiculopathy, lumbar region: Secondary | ICD-10-CM | POA: Diagnosis not present

## 2020-03-06 DIAGNOSIS — Z471 Aftercare following joint replacement surgery: Secondary | ICD-10-CM | POA: Diagnosis not present

## 2020-03-06 DIAGNOSIS — K635 Polyp of colon: Secondary | ICD-10-CM | POA: Diagnosis not present

## 2020-03-06 DIAGNOSIS — N1831 Chronic kidney disease, stage 3a: Secondary | ICD-10-CM | POA: Diagnosis not present

## 2020-03-06 DIAGNOSIS — E1136 Type 2 diabetes mellitus with diabetic cataract: Secondary | ICD-10-CM | POA: Diagnosis not present

## 2020-03-06 DIAGNOSIS — M170 Bilateral primary osteoarthritis of knee: Secondary | ICD-10-CM | POA: Diagnosis not present

## 2020-03-06 DIAGNOSIS — G4730 Sleep apnea, unspecified: Secondary | ICD-10-CM | POA: Diagnosis not present

## 2020-03-06 DIAGNOSIS — I1310 Hypertensive heart and chronic kidney disease without heart failure, with stage 1 through stage 4 chronic kidney disease, or unspecified chronic kidney disease: Secondary | ICD-10-CM | POA: Diagnosis not present

## 2020-03-06 DIAGNOSIS — F419 Anxiety disorder, unspecified: Secondary | ICD-10-CM | POA: Diagnosis not present

## 2020-03-06 DIAGNOSIS — G8929 Other chronic pain: Secondary | ICD-10-CM | POA: Diagnosis not present

## 2020-03-06 DIAGNOSIS — K760 Fatty (change of) liver, not elsewhere classified: Secondary | ICD-10-CM | POA: Diagnosis not present

## 2020-03-06 DIAGNOSIS — F329 Major depressive disorder, single episode, unspecified: Secondary | ICD-10-CM | POA: Diagnosis not present

## 2020-03-06 DIAGNOSIS — M810 Age-related osteoporosis without current pathological fracture: Secondary | ICD-10-CM | POA: Diagnosis not present

## 2020-03-06 DIAGNOSIS — E1122 Type 2 diabetes mellitus with diabetic chronic kidney disease: Secondary | ICD-10-CM | POA: Diagnosis not present

## 2020-03-06 DIAGNOSIS — N6011 Diffuse cystic mastopathy of right breast: Secondary | ICD-10-CM | POA: Diagnosis not present

## 2020-03-06 DIAGNOSIS — J432 Centrilobular emphysema: Secondary | ICD-10-CM | POA: Diagnosis not present

## 2020-03-07 ENCOUNTER — Inpatient Hospital Stay: Payer: BC Managed Care – PPO | Admitting: Orthopaedic Surgery

## 2020-03-07 ENCOUNTER — Ambulatory Visit: Payer: BC Managed Care – PPO | Admitting: Orthopaedic Surgery

## 2020-03-08 ENCOUNTER — Telehealth: Payer: Self-pay | Admitting: Orthopaedic Surgery

## 2020-03-08 NOTE — Telephone Encounter (Signed)
Bonnie [name] with [facility] called in wanting to inform dr.blackman that they cancel a pt appt for today due to the pt having problems with her knee.   [phone]

## 2020-03-12 ENCOUNTER — Other Ambulatory Visit: Payer: Self-pay | Admitting: Physician Assistant

## 2020-03-13 ENCOUNTER — Telehealth: Payer: Self-pay

## 2020-03-13 MED ORDER — HYDROCODONE-ACETAMINOPHEN 5-325 MG PO TABS: 1.0000 | ORAL_TABLET | Freq: Three times a day (TID) | ORAL | 0 refills | Status: DC | PRN

## 2020-03-13 NOTE — Telephone Encounter (Signed)
Pt called requesting med refill for hydrocodone. Pls send rx to [facility].

## 2020-03-13 NOTE — Telephone Encounter (Signed)
Sent!

## 2020-03-14 ENCOUNTER — Telehealth: Payer: Self-pay | Admitting: Orthopaedic Surgery

## 2020-03-14 ENCOUNTER — Encounter: Payer: Self-pay | Admitting: Orthopaedic Surgery

## 2020-03-14 ENCOUNTER — Other Ambulatory Visit: Payer: Self-pay

## 2020-03-14 ENCOUNTER — Ambulatory Visit (INDEPENDENT_AMBULATORY_CARE_PROVIDER_SITE_OTHER): Payer: BC Managed Care – PPO | Admitting: Orthopaedic Surgery

## 2020-03-14 DIAGNOSIS — Z96642 Presence of left artificial hip joint: Secondary | ICD-10-CM

## 2020-03-14 NOTE — Telephone Encounter (Signed)
Left a detailed vm msg for pt regarding med refill for hydrocodone. Direct call back info provided.

## 2020-03-14 NOTE — Telephone Encounter (Signed)
FYI:  Patient wants to hold off on PT. Her right knee has been bothering her and she wants to wait until she can get further directions from [place].  Call Baxter Flattery if needed [phone]

## 2020-03-14 NOTE — Progress Notes (Signed)
The patient will be 2 weeks status post a left total hip arthroplasty tomorrow.  She states she is doing great overall.  She is ambulate with a cane.  She is driving because she is only taking Tylenol.  She has been taking an aspirin twice daily.  She denies any calf swelling or pain or foot swelling and pain.  On exam her left hip incision looks good.  Remove all staples in place Steri-Strips.  She is slightly obese so there is a raw area at the top of her incision where I will have her place Bactroban ointment on this daily with a Band-Aid after each shower.  Her leg lengths look great.  She does feel like she is balanced as well.  At this point she can go down to just 1 baby aspirin daily.  We will see her back in 4 weeks to see how she is doing overall but no x-rays are needed.  All question concerns were answered and addressed.

## 2020-03-15 ENCOUNTER — Telehealth: Payer: Self-pay | Admitting: Orthopaedic Surgery

## 2020-03-15 NOTE — Telephone Encounter (Signed)
Baxter Flattery aware of the below message from Dr. Ninfa Linden

## 2020-03-15 NOTE — Telephone Encounter (Signed)
I said she wouldn't need outpatient PT.  If the patient feels she needs more home PT, that is fine.  If not, that is fine.

## 2020-03-15 NOTE — Telephone Encounter (Signed)
Baxter Flattery with [facility] called stating the pt told her Dr. Ninfa Linden said she would no longer need PT and Baxter Flattery called wanting to get that confirmed. Baxter Flattery would like a call back with this information.   [phone]

## 2020-03-22 DIAGNOSIS — G4733 Obstructive sleep apnea (adult) (pediatric): Secondary | ICD-10-CM | POA: Diagnosis not present

## 2020-03-25 ENCOUNTER — Other Ambulatory Visit: Payer: Self-pay | Admitting: Osteopathic Medicine

## 2020-03-25 DIAGNOSIS — Z1231 Encounter for screening mammogram for malignant neoplasm of breast: Secondary | ICD-10-CM

## 2020-03-29 ENCOUNTER — Ambulatory Visit
Admission: RE | Admit: 2020-03-29 | Discharge: 2020-03-29 | Disposition: A | Discharge status: Home / Self Care | Payer: BC Managed Care – PPO | Source: Ambulatory Visit | Attending: Osteopathic Medicine | Admitting: Osteopathic Medicine

## 2020-03-29 ENCOUNTER — Other Ambulatory Visit: Payer: Self-pay

## 2020-03-29 DIAGNOSIS — Z1231 Encounter for screening mammogram for malignant neoplasm of breast: Secondary | ICD-10-CM | POA: Diagnosis not present

## 2020-04-04 ENCOUNTER — Ambulatory Visit: Payer: BC Managed Care – PPO

## 2020-04-11 ENCOUNTER — Other Ambulatory Visit: Payer: Self-pay

## 2020-04-11 ENCOUNTER — Ambulatory Visit (INDEPENDENT_AMBULATORY_CARE_PROVIDER_SITE_OTHER): Payer: BC Managed Care – PPO | Admitting: Orthopaedic Surgery

## 2020-04-11 ENCOUNTER — Encounter: Payer: Self-pay | Admitting: Orthopaedic Surgery

## 2020-04-11 DIAGNOSIS — Z96642 Presence of left artificial hip joint: Secondary | ICD-10-CM

## 2020-04-11 NOTE — Progress Notes (Signed)
The patient is now 6 weeks out from a left total hip arthroplasty.  She is doing great overall.  She is having a little bit of discomfort in her upper back but overall is doing well.  On exam she is walking without a limp.  Her leg lengths are equal.  She tolerates me easily putting her left operative hip through internal and external rotation.  At this point I do not need to see her back for 6 months unless she is having issues.  At that visit I like a standing length pelvis and lateral of her left operative hip.

## 2020-04-15 ENCOUNTER — Other Ambulatory Visit: Payer: Self-pay

## 2020-04-15 MED ORDER — HYDROCODONE-ACETAMINOPHEN 5-325 MG PO TABS: 1.0000 | ORAL_TABLET | Freq: Three times a day (TID) | ORAL | 0 refills | Status: DC | PRN

## 2020-04-21 DIAGNOSIS — G4733 Obstructive sleep apnea (adult) (pediatric): Secondary | ICD-10-CM | POA: Diagnosis not present

## 2020-04-29 DIAGNOSIS — L814 Other melanin hyperpigmentation: Secondary | ICD-10-CM | POA: Diagnosis not present

## 2020-04-29 DIAGNOSIS — L821 Other seborrheic keratosis: Secondary | ICD-10-CM | POA: Diagnosis not present

## 2020-04-29 DIAGNOSIS — D485 Neoplasm of uncertain behavior of skin: Secondary | ICD-10-CM | POA: Diagnosis not present

## 2020-04-29 DIAGNOSIS — D1801 Hemangioma of skin and subcutaneous tissue: Secondary | ICD-10-CM | POA: Diagnosis not present

## 2020-04-29 DIAGNOSIS — D225 Melanocytic nevi of trunk: Secondary | ICD-10-CM | POA: Diagnosis not present

## 2020-05-21 ENCOUNTER — Other Ambulatory Visit: Payer: Self-pay

## 2020-05-21 NOTE — Telephone Encounter (Signed)
Pt called requesting med refill for hydrocodone. Last written on 04/15/20. Rx pended.

## 2020-05-22 DIAGNOSIS — G4733 Obstructive sleep apnea (adult) (pediatric): Secondary | ICD-10-CM | POA: Diagnosis not present

## 2020-05-22 MED ORDER — HYDROCODONE-ACETAMINOPHEN 5-325 MG PO TABS: 1.0000 | ORAL_TABLET | Freq: Three times a day (TID) | ORAL | 0 refills | Status: DC | PRN

## 2020-05-23 NOTE — Telephone Encounter (Signed)
Pt has been updated and is aware that med refill is available for her at the pharmacy. No other inquiries during the call.

## 2020-05-28 ENCOUNTER — Other Ambulatory Visit: Payer: Self-pay

## 2020-05-28 ENCOUNTER — Telehealth: Payer: Self-pay | Admitting: Sports Medicine

## 2020-05-28 ENCOUNTER — Ambulatory Visit (INDEPENDENT_AMBULATORY_CARE_PROVIDER_SITE_OTHER): Payer: BC Managed Care – PPO | Admitting: Sports Medicine

## 2020-05-28 DIAGNOSIS — M170 Bilateral primary osteoarthritis of knee: Secondary | ICD-10-CM

## 2020-05-28 NOTE — Telephone Encounter (Signed)
Orthovisc approval please, both knees, we tried this before with [employer] but she has switched to [employer].

## 2020-05-28 NOTE — Assessment & Plan Note (Signed)
This is a very pleasant 69 year old female, she has bilateral knee osteoarthritis, we last injected her sometime last year, repeat bilateral knee injections today, worsening of pain. She has switched insurance, so we are going to try to get her approved for Orthovisc again.

## 2020-05-28 NOTE — Telephone Encounter (Signed)
Started approval process on Orthovisc. - CF

## 2020-05-28 NOTE — Progress Notes (Signed)
    Procedures performed today:    Procedure: Real-time Ultrasound Guided injection of the left knee Device: Samsung HS60  Verbal informed consent obtained.  Time-out conducted.  Noted no overlying erythema, induration, or other signs of local infection.  Skin prepped in a sterile fashion.  Local anesthesia: Topical Ethyl chloride.  With sterile technique and under real time ultrasound guidance: 1 cc Kenalog 40, 2 cc lidocaine, 2 cc bupivacaine injected easily Completed without difficulty  Pain immediately resolved suggesting accurate placement of the medication.  Advised to call if fevers/chills, erythema, induration, drainage, or persistent bleeding.  Images permanently stored and available for review in the ultrasound unit.  Impression: Technically successful ultrasound guided injection.  Procedure: Real-time Ultrasound Guided injection of the right knee Device: Samsung HS60  Verbal informed consent obtained.  Time-out conducted.  Noted no overlying erythema, induration, or other signs of local infection.  Skin prepped in a sterile fashion.  Local anesthesia: Topical Ethyl chloride.  With sterile technique and under real time ultrasound guidance: 1 cc Kenalog 40, 2 cc lidocaine, 2 cc bupivacaine injected easily Completed without difficulty  Pain immediately resolved suggesting accurate placement of the medication.  Advised to call if fevers/chills, erythema, induration, drainage, or persistent bleeding.  Images permanently stored and available for review in the ultrasound unit.  Impression: Technically successful ultrasound guided injection.  Independent interpretation of notes and tests performed by another provider:   None.  Brief History, Exam, Impression, and Recommendations:    Primary osteoarthritis of both knees This is a very pleasant 69 year old female, she has bilateral knee osteoarthritis, we last injected her sometime last year, repeat bilateral knee injections  today, worsening of pain. She has switched insurance, so we are going to try to get her approved for Orthovisc again.    ___________________________________________ Gwen Her. Dianah Field, M.D., ABFM., CAQSM. Primary Care and [place] Instructor of [employer] [facility] [employer] Medicine

## 2020-05-30 NOTE — Telephone Encounter (Signed)
Received Benefits Investigation and Orthovisc is not covered under patient's medical or pharmacy plan.  They stated the patient may qualify for the [employer] Direct Access Program. - CF

## 2020-05-30 NOTE — Telephone Encounter (Signed)
Not entirely sure what that is, would you look into and let me know?

## 2020-06-03 NOTE — Telephone Encounter (Signed)
Dr. Earney Mallet called Hayley at [facility] and left a message she emailed with the following information on what the [employer] Direct Access program is.   The Patient Direct Access Program is a program that we partner with [employer] on. We send the patients prescriber signed enrollment form, and benefits outcome over to [employer]. They review and run everything through their system. They will then contact the patient and let them know if they do/do not qualify for the program. If they do, they will inform them of what their copayment is (this is based off of income). Once the patient consents and pays, the medication will ship from [place] to your office to administer.

## 2020-06-16 ENCOUNTER — Other Ambulatory Visit: Payer: Self-pay | Admitting: Osteopathic Medicine

## 2020-06-21 DIAGNOSIS — G4733 Obstructive sleep apnea (adult) (pediatric): Secondary | ICD-10-CM | POA: Diagnosis not present

## 2020-06-26 ENCOUNTER — Other Ambulatory Visit: Payer: Self-pay

## 2020-06-26 ENCOUNTER — Encounter: Payer: Self-pay | Admitting: Osteopathic Medicine

## 2020-06-26 ENCOUNTER — Ambulatory Visit (INDEPENDENT_AMBULATORY_CARE_PROVIDER_SITE_OTHER): Payer: BC Managed Care – PPO | Admitting: Osteopathic Medicine

## 2020-06-26 VITALS — BP 138/79 | HR 79 | Temp 98.0°F | Wt 282.0 lb

## 2020-06-26 DIAGNOSIS — D509 Iron deficiency anemia, unspecified: Secondary | ICD-10-CM

## 2020-06-26 DIAGNOSIS — E1169 Type 2 diabetes mellitus with other specified complication: Secondary | ICD-10-CM | POA: Diagnosis not present

## 2020-06-26 LAB — CBC
HCT: 37.9 % (ref 35.0–45.0)
Hemoglobin: 12.1 g/dL (ref 11.7–15.5)
MCH: 27.4 pg (ref 27.0–33.0)
MCHC: 31.9 g/dL — ABNORMAL LOW (ref 32.0–36.0)
MCV: 85.9 fL (ref 80.0–100.0)
MPV: 10 fL (ref 7.5–12.5)
Platelets: 347 10*3/uL (ref 140–400)
RBC: 4.41 10*6/uL (ref 3.80–5.10)
RDW: 13.6 % (ref 11.0–15.0)
WBC: 5.6 10*3/uL (ref 3.8–10.8)

## 2020-06-26 LAB — POCT GLYCOSYLATED HEMOGLOBIN (HGB A1C): Hemoglobin A1C: 6.1 % — AB (ref 4.0–5.6)

## 2020-06-26 LAB — IRON,TIBC AND FERRITIN PANEL
%SAT: 25 % (calc) (ref 16–45)
Ferritin: 46 ng/mL (ref 16–288)
Iron: 99 ug/dL (ref 45–160)
TIBC: 390 mcg/dL (calc) (ref 250–450)

## 2020-06-26 NOTE — Progress Notes (Signed)
Bonnie [name] is a 69 y.o. female who presents to  [place] at [facility]  today, 06/26/20, seeking care for the following:  The primary encounter diagnosis was Type 2 diabetes mellitus with other specified complication, without long-term current use of insulin ([place]). A diagnosis of Microcytic anemia was also pertinent to this visit.    ASSESSMENT & PLAN with other pertinent history/findings:  Type 2 diabetes mellitus  Increased Rybelsus last visit to help weight loss, last A1C 6.2 02/2020. Today: A1C 6.1 Results for orders placed or performed in visit on 06/26/20 (from the past 24 hour(s))  POCT HgB A1C     Status: Abnormal   Collection Time: 06/26/20 10:45 AM  Result Value Ref Range   Hemoglobin A1C 6.1 (A) 4.0 - 5.6 %   HbA1c POC (<> result, manual entry)     HbA1c, POC (prediabetic range)     HbA1c, POC (controlled diabetic range)     Anemia CBC results reviewed perioperatively Will recheck  Orders Placed This Encounter  Procedures  . CBC  . Fe+TIBC+Fer  . POCT HgB A1C    Primary osteoarthritis of both knees Primary osteoarthritis of left hip Following w/ sports med and ortho  S/p L hip replacement 02/2020 and doing well!  Continue meds   Obesity Working on weight loss      There are no Patient Instructions on file for this visit. No orders of the defined types were placed in this encounter.     Follow-up instructions: Return in about 6 months (around 12/27/2020) for Jay (call week prior to visit for lab orders).                         BP 138/79 (BP Location: Left Arm, Patient Position: Sitting)   Pulse 79   Temp 98 F (36.7 C)   Wt 282 lb (127.9 kg)   SpO2 97%   BMI 41.64 kg/m   Current Meds  Medication Sig  . allopurinol (ZYLOPRIM) 100 MG tablet TAKE 1 TABLET DAILY (Patient taking differently: Take 100 mg by mouth daily. )  . ALPRAZolam (XANAX) 0.25 MG tablet Take 1-2 tablets  (0.25-0.5 mg total) by mouth at bedtime as needed for anxiety or sleep. DO NOT COMBINE WITH HYDROCODONE/NORCO  . aspirin 81 MG chewable tablet Chew by mouth daily.  Marland Kitchen atorvastatin (LIPITOR) 40 MG tablet Take 1 tablet (40 mg total) by mouth daily.  . blood glucose meter kit and supplies KIT Check morning fasting blood glucose and up to four times daily as directed.  Marland Kitchen buPROPion (WELLBUTRIN SR) 150 MG 12 hr tablet TAKE 1 TABLET EVERY MORNING  . Cholecalciferol (VITAMIN D3) 50 MCG (2000 UT) TABS Take 2,000 Units by mouth daily.  . enalapril (VASOTEC) 10 MG tablet Take 1 tablet (10 mg total) by mouth daily.  . fluticasone (FLONASE) 50 MCG/ACT nasal spray Place 2 sprays into both nostrils daily as needed for allergies or rhinitis.   Marland Kitchen HYDROcodone-acetaminophen (NORCO/VICODIN) 5-325 MG tablet Take 1 tablet by mouth every 8 (eight) hours as needed for moderate pain or severe pain.  . Multiple Vitamin tablet Take 1 tablet by mouth daily.   . Omega-3 Fatty Acids (FISH OIL) 1000 MG CAPS Take 1,000 mg by mouth daily.   . ONE TOUCH ULTRA TEST test strip   . ONETOUCH DELICA LANCETS 38G MISC   . RYBELSUS 7 MG TABS TAKE 1 TABLET DAILY  . traZODone (DESYREL) 50  MG tablet Take 1 tablet (50 mg total) by mouth at bedtime as needed.  . triamterene-hydrochlorothiazide (MAXZIDE-25) 37.5-25 MG tablet Take 1 tablet by mouth daily.  Marland Kitchen venlafaxine XR (EFFEXOR-XR) 75 MG 24 hr capsule Take 1 capsule (75 mg total) by mouth daily with breakfast.    No results found for this or any previous visit (from the past 72 hour(s)).  No results found.  Depression screen [facility] 2/9 10/10/2019 08/24/2019 01/17/2019  Decreased Interest 0 1 0  Down, Depressed, Hopeless 0 1 0  PHQ - 2 Score 0 2 0  Altered sleeping _0 Tired, decreased energy 0 1 0  Change in appetite _1 Feeling bad or failure about yourself  0 0 0  Trouble concentrating 0 1 0  Moving slowly or fidgety/restless 0 0 0  Suicidal thoughts 0 0 0  PHQ-9 Score _2 Difficult doing work/chores - - Not difficult at all    GAD 7 : Generalized Anxiety Score 10/10/2019 08/24/2019 01/17/2019 10/06/2018  Nervous, Anxious, on Edge 0 1 0 0  Control/stop worrying 0 1 0 1  Worry too much - different things 0 1 0 0  Trouble relaxing 1 1 0 0  Restless 0 0 0 0  Easily annoyed or irritable 1 0 0 0  Afraid - awful might happen 0 0 0 0  Total GAD 7 Score 2 4 0 1      All questions at time of visit were answered - patient instructed to contact office with any additional concerns or updates.  ER/RTC precautions were reviewed with the patient.  Please note: voice recognition software was used to produce this document, and typos may escape review. Please contact Dr. Sheppard Coil for any needed clarifications.

## 2020-06-26 NOTE — Patient Instructions (Signed)
Trial increase Trazodone 50 mg to 1/5 tablet (75 mg) or 2 tablets (100 mg) as needed for sleep   Getting labs today to recheck blood counts

## 2020-07-05 ENCOUNTER — Other Ambulatory Visit: Payer: Self-pay

## 2020-07-05 DIAGNOSIS — M1612 Unilateral primary osteoarthritis, left hip: Secondary | ICD-10-CM

## 2020-07-05 DIAGNOSIS — Z79891 Long term (current) use of opiate analgesic: Secondary | ICD-10-CM

## 2020-07-05 NOTE — Telephone Encounter (Signed)
Last refill- 05/22/2020  Last Ov-- 06/26/2020

## 2020-07-09 MED ORDER — HYDROCODONE-ACETAMINOPHEN 5-325 MG PO TABS: 1.0000 | ORAL_TABLET | Freq: Three times a day (TID) | ORAL | 0 refills | Status: DC | PRN

## 2020-07-09 NOTE — Telephone Encounter (Signed)
Last UDS was 12/2018.  Needs to come in for UDS>

## 2020-07-10 ENCOUNTER — Telehealth: Payer: Self-pay | Admitting: *Deleted

## 2020-07-10 NOTE — Telephone Encounter (Signed)
Order signed.

## 2020-07-10 NOTE — Telephone Encounter (Signed)
Per covering provider, overdue for a urine drug screen. Pt was updated and is agreeable with completing urine drug screen. Order pended for review.

## 2020-07-10 NOTE — Telephone Encounter (Signed)
Spoke with pt this afternoon. She has decided to not do orthovisc due to cost.  She was asking about synvisc and wanted to know if we could try to get that approved instead, hoping to lower the cost a little.  Can you work on this?

## 2020-07-11 NOTE — Telephone Encounter (Signed)
Pt was informed to complete the lab order sometime next week in the initial call. Aware order must be completed before next refill request. No other inquiries during the call.

## 2020-07-11 NOTE — Telephone Encounter (Signed)
I called insurance and the Synvisc and no other injection is covered by the plan.  Patient is aware and will think on the options she may have. Do you think she would be a candidate for a PRP injection? Please advise.

## 2020-07-12 NOTE — Telephone Encounter (Signed)
If nothing is covered including Synvisc then yes I think PRP would be the only next available option, it would be $475 a piece, and we typically do 3 injections per knee.

## 2020-07-15 NOTE — Telephone Encounter (Signed)
Patient is going to check with her husband and let us know about starting this process. She will call back once she has made a decision.

## 2020-07-19 LAB — DRUG MONITORING, PANEL 6 WITH CONFIRMATION, URINE
6 Acetylmorphine: NEGATIVE ng/mL (ref <10)
Alcohol Metabolites: NEGATIVE ng/mL
Amphetamines: NEGATIVE ng/mL (ref <500)
Barbiturates: NEGATIVE ng/mL (ref <300)
Benzodiazepines: NEGATIVE ng/mL (ref <100)
Cocaine Metabolite: NEGATIVE ng/mL (ref <150)
Creatinine: 161 mg/dL
Marijuana Metabolite: NEGATIVE ng/mL (ref <20)
Methadone Metabolite: NEGATIVE ng/mL (ref <100)
Opiates: NEGATIVE ng/mL (ref <100)
Oxidant: NEGATIVE ug/mL
Oxycodone: NEGATIVE ng/mL (ref <100)
Phencyclidine: NEGATIVE ng/mL (ref <25)
pH: 6 (ref 4.5–9.0)

## 2020-07-19 LAB — DM TEMPLATE

## 2020-07-19 IMAGING — DX DG HIP (WITH OR WITHOUT PELVIS) 2-3V*L*
3 series · 3 of 3 positions shown · non-contrast
Comparison: 12/09/2018

CLINICAL DATA: Left hip pain.

EXAM:
DG HIP (WITH OR WITHOUT PELVIS) 2-3V LEFT

[pelvis ap]
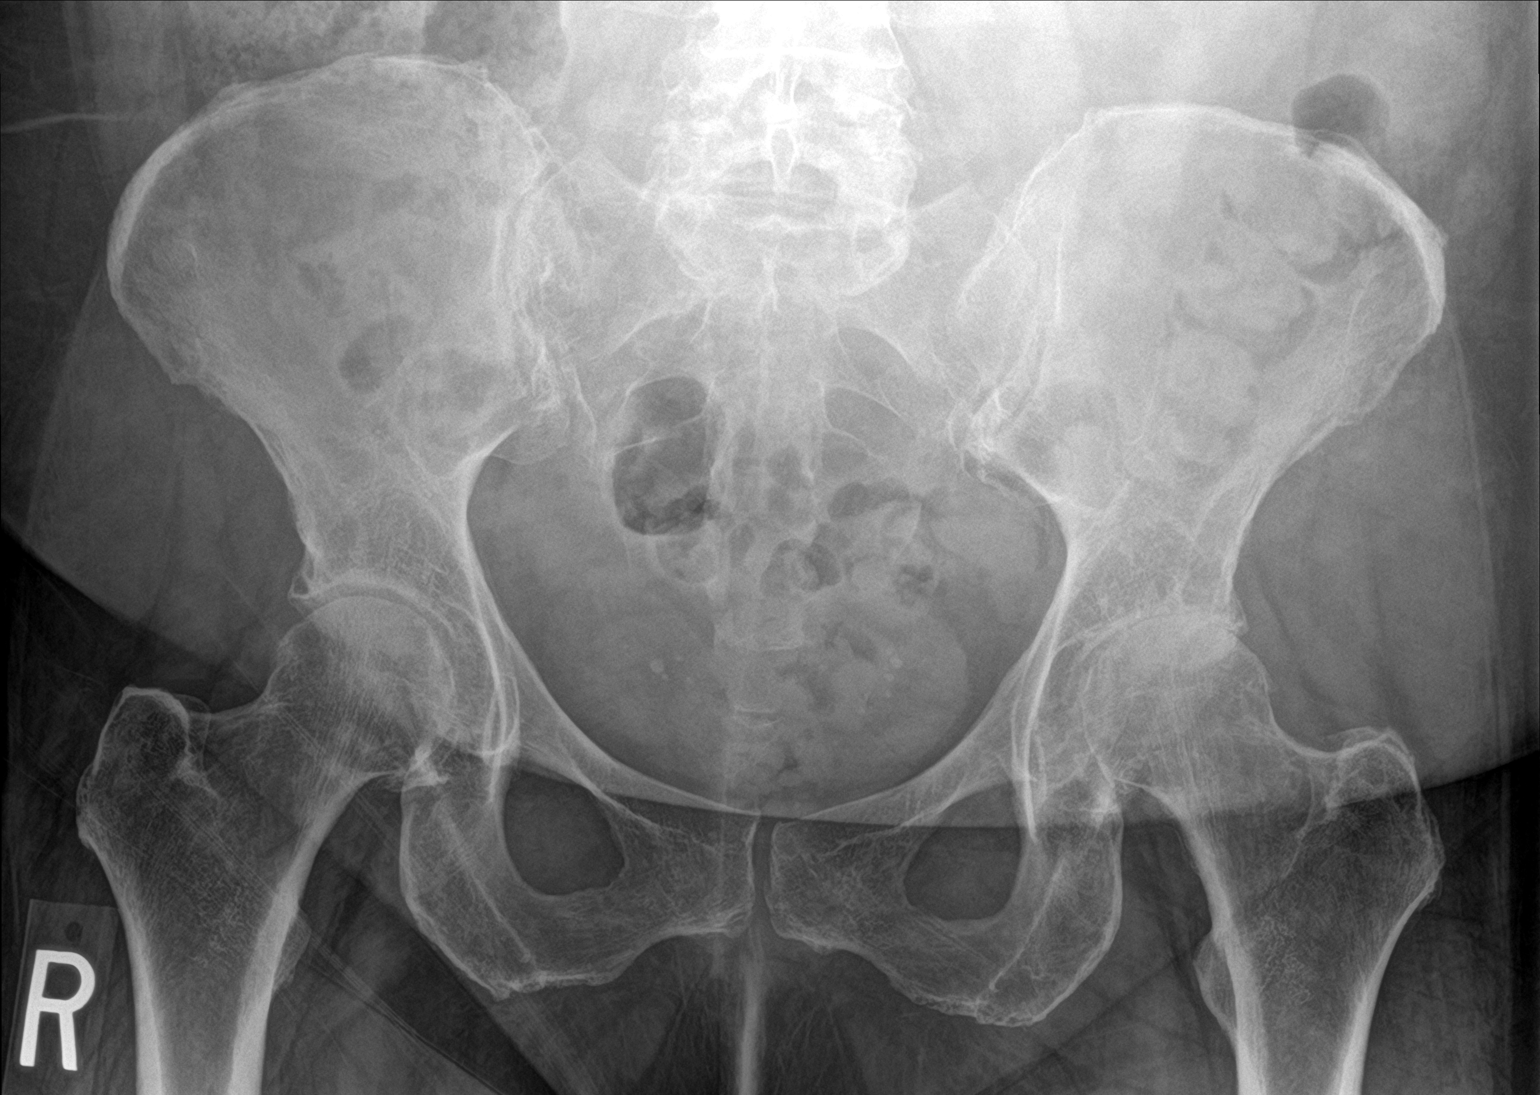

[hip ap]
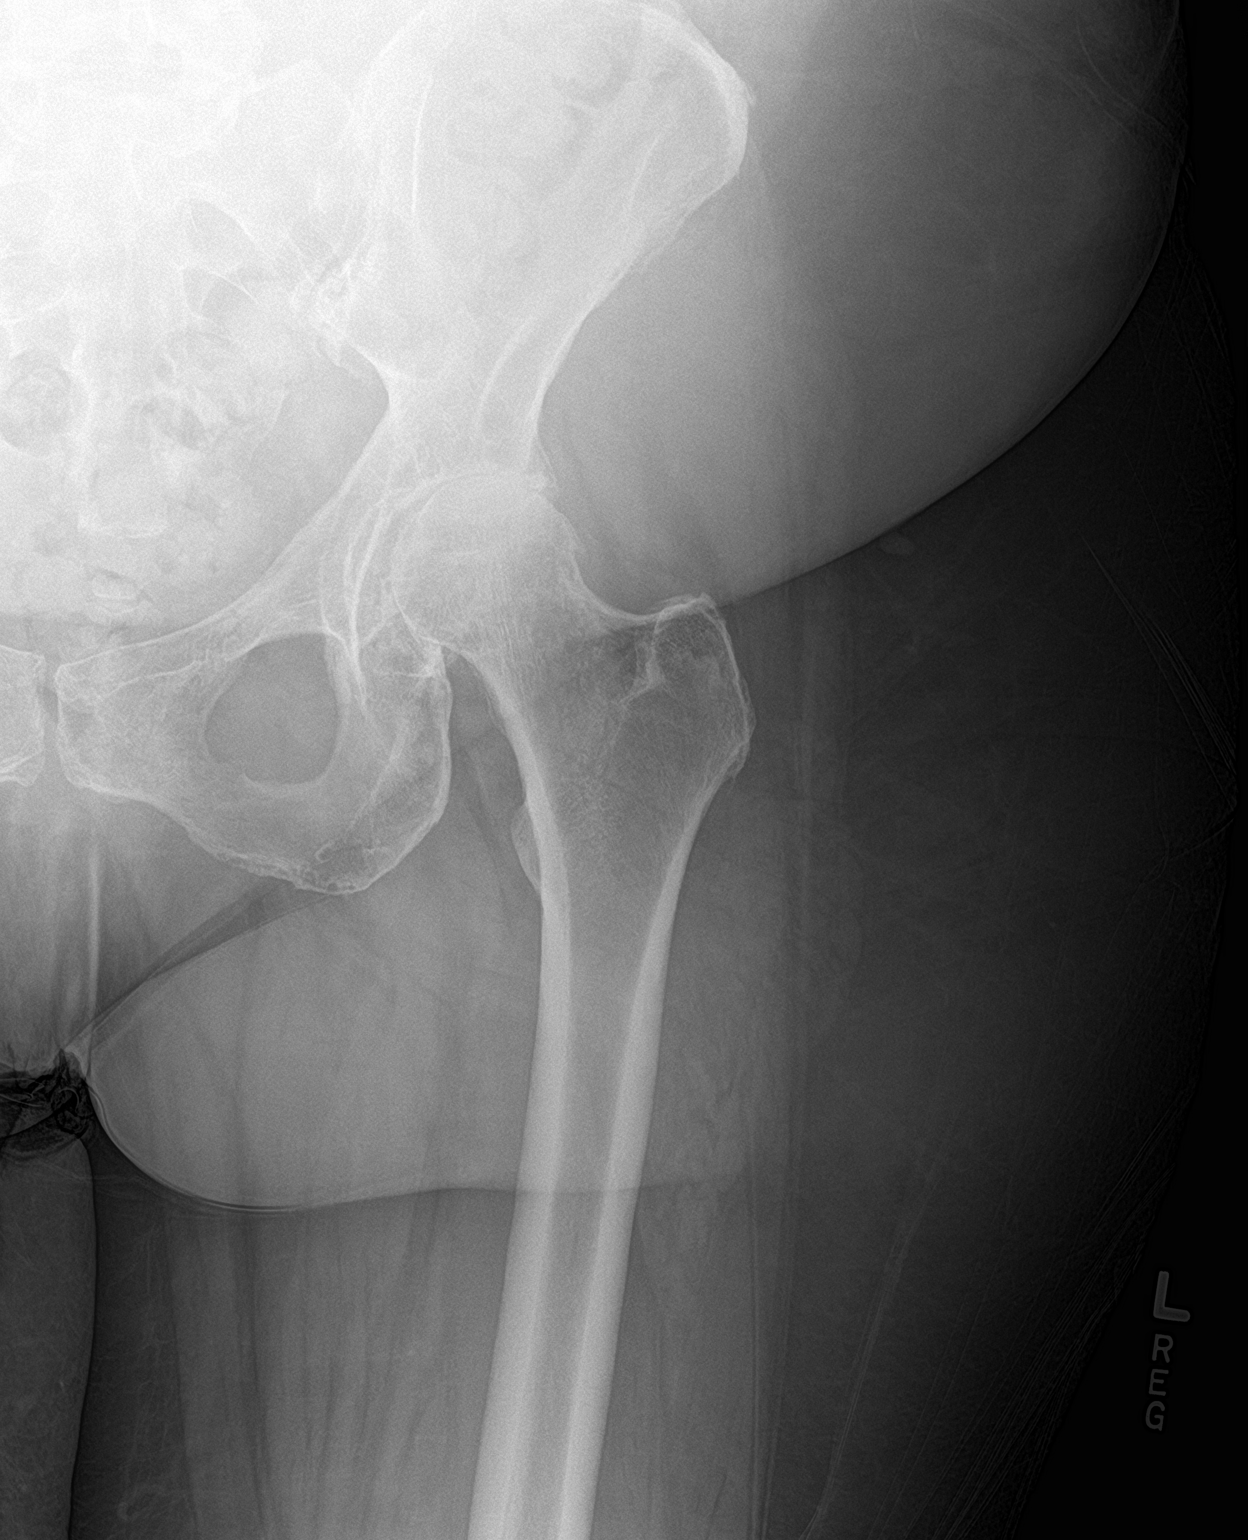

[hip lat]
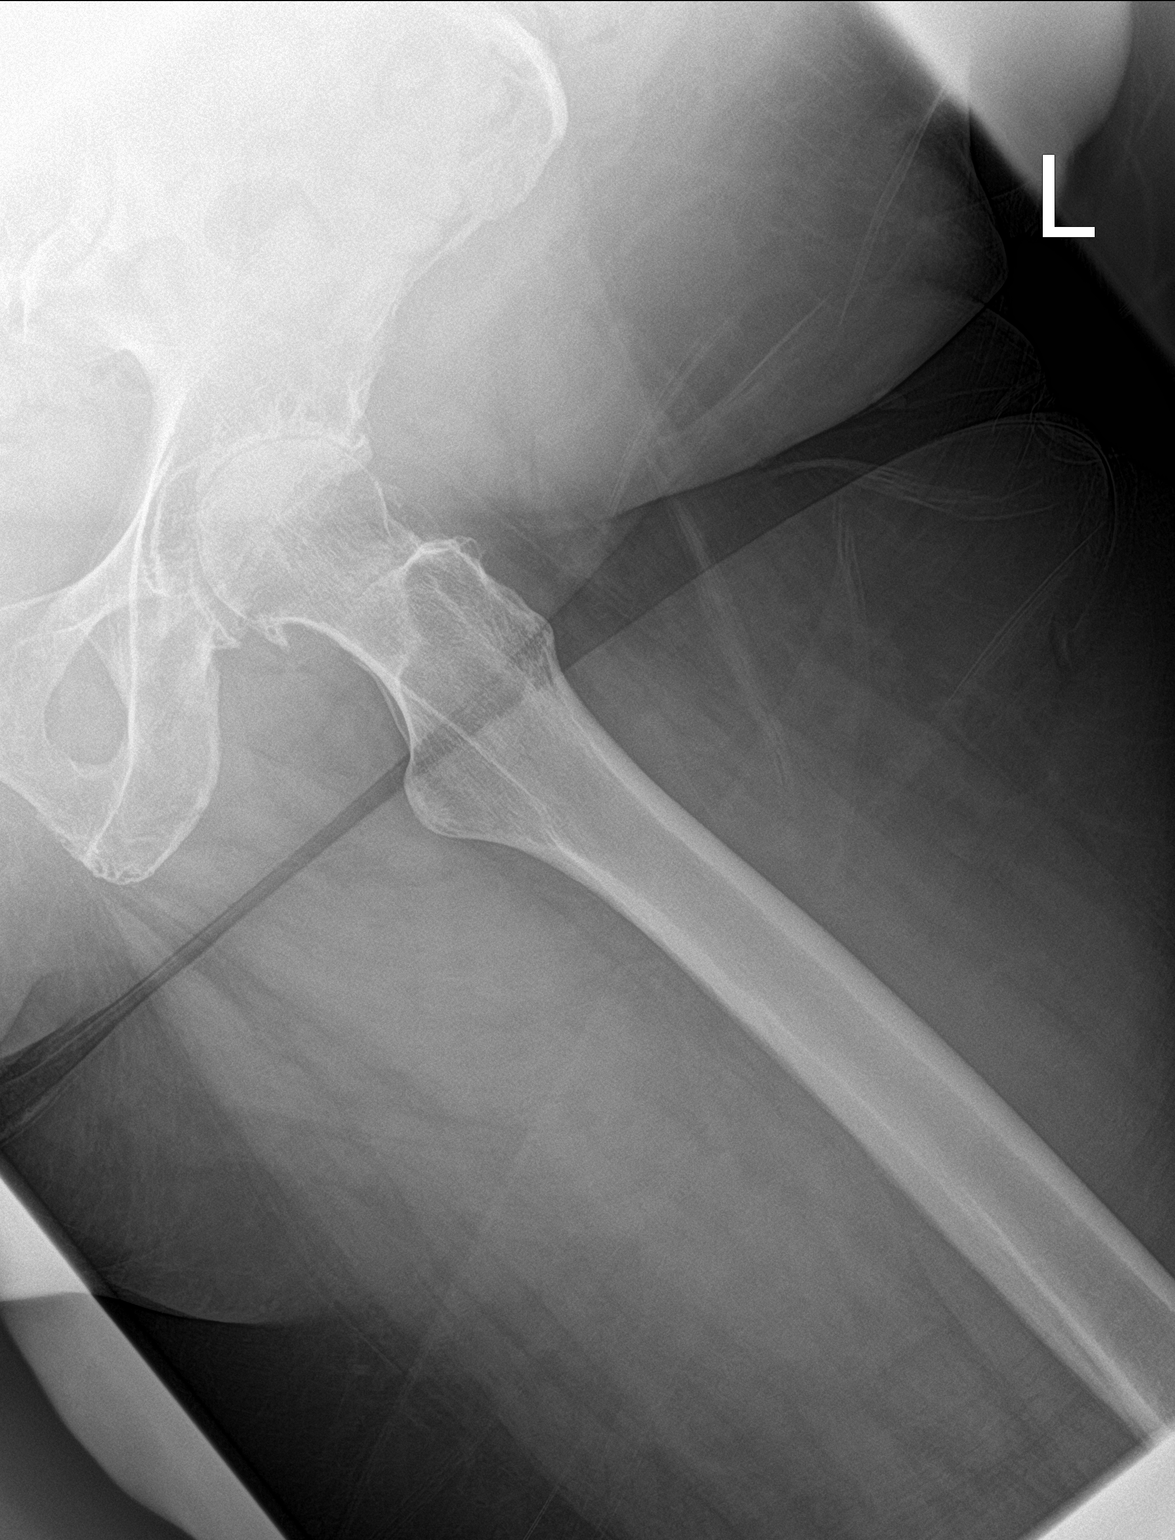

[3 of 3 positions shown; findings below may reference images not displayed]

FINDINGS: Both hips are normally located. Severe left and moderate right hip
joint degenerative changes.

The left hip demonstrates severe joint space narrowing, osteophytic
spurring, subchondral cystic change and bony eburnation. I do not
see any obvious changes of AVN. No acute fracture. The pubic
symphysis and SI joints are intact. The bony pelvis is intact.
IMPRESSION: 1. Severe left and moderate right hip joint degenerative changes.
2. No definite plain film findings for AVN involving the left hip.
3. Intact bony pelvis.

## 2020-07-22 DIAGNOSIS — G4733 Obstructive sleep apnea (adult) (pediatric): Secondary | ICD-10-CM | POA: Diagnosis not present

## 2020-08-02 ENCOUNTER — Other Ambulatory Visit: Payer: Self-pay

## 2020-08-02 DIAGNOSIS — E1169 Type 2 diabetes mellitus with other specified complication: Secondary | ICD-10-CM

## 2020-08-02 DIAGNOSIS — E1165 Type 2 diabetes mellitus with hyperglycemia: Secondary | ICD-10-CM

## 2020-08-02 MED ORDER — ATORVASTATIN CALCIUM 40 MG PO TABS: 40.0000 mg | ORAL_TABLET | Freq: Every day | ORAL | 3 refills | Status: DC

## 2020-08-03 ENCOUNTER — Encounter: Payer: Self-pay | Admitting: Osteopathic Medicine

## 2020-08-03 DIAGNOSIS — R911 Solitary pulmonary nodule: Secondary | ICD-10-CM

## 2020-08-23 ENCOUNTER — Ambulatory Visit (INDEPENDENT_AMBULATORY_CARE_PROVIDER_SITE_OTHER): Payer: BC Managed Care – PPO

## 2020-08-23 ENCOUNTER — Other Ambulatory Visit: Payer: Self-pay

## 2020-08-23 DIAGNOSIS — I700 Atherosclerosis of aorta: Secondary | ICD-10-CM | POA: Diagnosis not present

## 2020-08-23 DIAGNOSIS — R911 Solitary pulmonary nodule: Secondary | ICD-10-CM

## 2020-08-23 DIAGNOSIS — R918 Other nonspecific abnormal finding of lung field: Secondary | ICD-10-CM | POA: Diagnosis not present

## 2020-08-23 DIAGNOSIS — I2510 Atherosclerotic heart disease of native coronary artery without angina pectoris: Secondary | ICD-10-CM | POA: Diagnosis not present

## 2020-08-28 ENCOUNTER — Other Ambulatory Visit: Payer: Self-pay

## 2020-08-28 DIAGNOSIS — M1612 Unilateral primary osteoarthritis, left hip: Secondary | ICD-10-CM

## 2020-08-28 LAB — HM DIABETES EYE EXAM

## 2020-08-28 MED ORDER — HYDROCODONE-ACETAMINOPHEN 5-325 MG PO TABS: 1.0000 | ORAL_TABLET | Freq: Three times a day (TID) | ORAL | 0 refills | Status: DC | PRN

## 2020-08-28 NOTE — Telephone Encounter (Signed)
Last refill- 07/09/20 Last Ov- 06/26/2020

## 2020-09-02 ENCOUNTER — Other Ambulatory Visit: Payer: Self-pay

## 2020-09-02 ENCOUNTER — Ambulatory Visit: Payer: Self-pay

## 2020-09-02 ENCOUNTER — Encounter: Payer: Self-pay | Admitting: Orthopaedic Surgery

## 2020-09-02 ENCOUNTER — Ambulatory Visit (INDEPENDENT_AMBULATORY_CARE_PROVIDER_SITE_OTHER): Payer: BC Managed Care – PPO | Admitting: Orthopaedic Surgery

## 2020-09-02 DIAGNOSIS — G8929 Other chronic pain: Secondary | ICD-10-CM | POA: Diagnosis not present

## 2020-09-02 DIAGNOSIS — M545 Low back pain, unspecified: Secondary | ICD-10-CM

## 2020-09-02 DIAGNOSIS — Z96642 Presence of left artificial hip joint: Secondary | ICD-10-CM | POA: Diagnosis not present

## 2020-09-02 NOTE — Progress Notes (Signed)
Office Visit Note   Patient: Bonnie [name]           Date of Birth: 07/29/1951           MRN: [id number] Visit Date: 09/02/2020              Requested by: Emeterio Reeve, [place] [address] [place],  [place] [address] PCP: Emeterio Reeve, DO   Assessment & Plan: Visit Diagnoses:  1. Status post total replacement of left hip   2. Chronic midline low back pain without sciatica     Plan: I do feel that she would benefit from formal outpatient physical therapy on her lumbar spine with any modalities that could help decrease her pain symptoms including e-stim and diaphoresis as well as dry needling.  They can also evaluate her for a TENS unit.  All question concerns were answered and addressed.  We will see her back in 6 weeks after course of therapy.  At that visit I would like a standing AP and lateral of the lumbar spine.  Follow-Up Instructions: Return in about 6 weeks (around 10/14/2020).   Orders:  Orders Placed This Encounter  Procedures  . XR HIP UNILAT W OR W/O PELVIS 1V LEFT   No orders of the defined types were placed in this encounter.     Procedures: No procedures performed   Clinical Data: No additional findings.   Subjective: Chief Complaint  Patient presents with  . Left Hip - Follow-up  The patient is now 6 months status post a left total hip arthroplasty through direct anterior approach. She states that overall she is doing well but her left hip has been bothering her at night recently.  She says it does not hurt her during the daytime at all.  She understands it can take up to a year to completely just from hip replacement surgery.  Her hip was significantly arthritic.  She has been having some low back pain for some time now.  She has a history of a L1 compression fracture and instrumentation with spinal fusion around that fracture.  This was done years ago.  In 2019 she did have an MRI of her lumbar spine by her primary care and sports  medicine physician.  She was also sent to a physician for multiple facet joint injections and even radiofrequency ablation.  Her only complaint is pain along the lower back.  She denies any radicular symptoms at all.  HPI  Review of Systems There is currently no listed headache, chest pain, shortness of breath, fever, chills, nausea, vomiting  Objective: Vital Signs: There were no vitals taken for this visit.  Physical Exam She is alert and orient x3 and in no acute distress. Ortho Exam Her left operative hip moves smoothly and fluidly.  Her leg lengths are equal.  She does have lower lumbar spine pain across the lower aspect of the lumbar spine. Specialty Comments:  No specialty comments available.  Imaging: XR HIP UNILAT W OR W/O PELVIS 1V LEFT  Result Date: 09/02/2020 An AP pelvis and lateral of the left hip shows a well-seated total hip arthroplasty with no complicating features.  The previous MRI of her lumbar spine from 2019 showed no significant findings in terms of neuro encroachment of the lower lumbar spine.  There was some mild arthritic findings at multiple levels.  PMFS History: Patient Active Problem List   Diagnosis Date Noted  . Unilateral primary osteoarthritis, left hip 03/01/2020  . Status  post total replacement of left hip 03/01/2020  . Acute kidney injury superimposed on CKD ([place]) 09/17/2019  . Insomnia secondary to anxiety 09/17/2019  . Increase in serum creatinine from prior measurement 09/17/2019  . Abnormal nuclear stress test 09/04/2019  . Mild diastolic dysfunction 65/02/5464  . Right bundle branch block 07/05/2019  . Left axis deviation 07/05/2019  . Decreased exercise tolerance 07/05/2019  . Sphincter of Oddi dysfunction 07/05/2019  . Multiple pulmonary nodules determined by computed tomography of lung 07/05/2019  . Abnormal QT interval present on electrocardiogram 07/05/2019  . Irregular heart beats 06/29/2019  . Family history of CHF (congestive  heart failure) 06/29/2019  . Recurrent major depressive disorder, in partial remission ([place]) 06/12/2019  . Encounter for diabetic foot exam ([place]) 06/12/2019  . Grief reaction 05/18/2019  . History of colon polyps 01/17/2019  . Encounter for long-term (current) use of high-risk medication 01/17/2019  . Primary osteoarthritis of left hip 12/19/2018  . Left hip pain 10/13/2018  . Centrilobular emphysema ([place]) 07/25/2018  . Closed compression fracture of body of L1 vertebra ([place]) 07/25/2018  . Renal cyst, acquired, left 07/25/2018  . Primary osteoarthritis involving multiple joints 07/06/2018  . Diabetic eye exam ([place]) 07/06/2018  . History of multiple pulmonary nodules 07/06/2018  . Encounter for screening for lung cancer 07/06/2018  . Type 2 diabetes mellitus with hyperglycemia, without long-term current use of insulin ([place]) 04/04/2018  . Dyslipidemia associated with type 2 diabetes mellitus ([place]) 04/04/2018  . Class 3 severe obesity due to excess calories with serious comorbidity and body mass index (BMI) of 40.0 to 44.9 in adult ([place]) 04/03/2018  . Prediabetes 04/03/2018  . Encounter for weight loss counseling 04/03/2018  . Onychomycosis 03/28/2018  . Morbid obesity ([place]) 02/28/2018  . Bilateral foot pain 02/28/2018  . Benign cyst of right breast 01/26/2018  . Abnormal mammogram of right breast 01/20/2018  . Chronic kidney disease, stage 3a 12/31/2017  . Encounter for chronic pain management 12/30/2017  . Chronic use of opiate for therapeutic purpose 12/30/2017  . History of melanoma 12/30/2017  . History of renal cell carcinoma 12/30/2017  . Arthritis of carpometacarpal [facility]) joint of left thumb 05/19/2017  . Controlled substance agreement signed 03/30/2017  . H/O acute pancreatitis 07/17/2016  . History of biliary stent insertion 07/17/2016  . Coronary artery calcification seen on CT scan 11/08/2015  . History of back surgery 09/23/2015  . Spondylosis of lumbar region without  myelopathy or radiculopathy 08/30/2015  . Primary osteoarthritis of both knees 03/01/2015  . Cellulitis of face 02/19/2015  . Anxiety 07/24/2014  . Basal cell carcinoma 10/04/2013  . Depression, major 10/04/2013  . Squamous cell carcinoma 10/04/2013  . Dyspnea on exertion -> considered to be possible anginal equivalent 04/20/2012  . Essential hypertension 03/29/2012  . Former smoker 03/29/2012  . Gout 03/29/2012  . Hyperlipidemia 03/29/2012  . Palpitations 03/29/2012  . OSA on CPAP 03/29/2012   Past Medical History:  Diagnosis Date  . Abnormal mammogram of right breast 01/20/2018  . Anxiety   . Benign cyst of right breast 01/26/2018  . Blood transfusion without reported diagnosis   . Cataract   . Centrilobular emphysema ([place]) 07/25/2018   mild  . 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 Chronic low back pain   . Colon polyps   . Depression   . Diabetes mellitus without complication ([place])   . Emphysema of lung ([place])   . Fatty liver   . Hypertension   .  Melanoma ([place])    R upper extremity  . Melanoma in situ of right upper extremity ([place])   . Mild diastolic dysfunction 06/11/2978  . Osteoarthritis of lumbar spine   . Osteoporosis   . Renal cancer ([place])   . Skin cancer   . Sleep apnea 2008   cpap nightly  . Sphincter of Oddi dysfunction    bile duct    Family History  Problem Relation Age of Onset  . Depression Mother   . Hyperlipidemia Mother   . Hypertension Mother   . Colon polyps Mother   . Heart attack Father   . Hyperlipidemia Father   . Hypertension Father   . Colon polyps Father   . Alcohol abuse Sister   . Depression Sister   . Alcohol abuse Son   . Alcohol abuse Son   . Alcohol abuse Sister   . Depression Sister   . Stomach cancer Brother   . Colon cancer Neg Hx   . Esophageal cancer Neg Hx   . Rectal cancer Neg Hx     Past Surgical History:  Procedure Laterality Date  . ABDOMINAL HYSTERECTOMY    . ANKLE SURGERY Left    in her  36s  . BILE DUCT STENT PLACEMENT    . CARDIAC CATHETERIZATION    . CHOLECYSTECTOMY    . COLONOSCOPY    . LEFT HEART CATH AND CORONARY ANGIOGRAPHY N/A 09/05/2019   Procedure: LEFT HEART CATH AND CORONARY ANGIOGRAPHY;  Surgeon: Leonie Man, MD;  Location: [facility];  Service: Cardiovascular;  Laterality: N/A;  . LUMBAR FUSION     7 level   . MOHS SURGERY     x 11  . PARTIAL NEPHRECTOMY    . POLYPECTOMY    . TOTAL HIP ARTHROPLASTY Left 03/01/2020   Procedure: LEFT TOTAL HIP ARTHROPLASTY ANTERIOR APPROACH;  Surgeon: Mcarthur Rossetti, MD;  Location: [facility];  Service: Orthopedics;  Laterality: Left;  . UPPER GASTROINTESTINAL ENDOSCOPY     Social History   Occupational History  . Not on file  Tobacco Use  . Smoking status: Former Smoker    Packs/day: 1.00    Years: 40.00    Pack years: 40.00    Types: Cigarettes    Quit date: 12/21/2010    Years since quitting: 9.7  . Smokeless tobacco: Never Used  Vaping Use  . Vaping Use: Former  . Devices: 2012-2014 while quitting cigaretter  Substance and Sexual Activity  . Alcohol use: Yes    Alcohol/week: 1.0 - 2.0 standard drink    Types: 1 - 2 Standard drinks or equivalent per week  . Drug use: No  . Sexual activity: Yes    Birth control/protection: Surgical, None

## 2020-09-10 ENCOUNTER — Other Ambulatory Visit: Payer: Self-pay

## 2020-09-10 ENCOUNTER — Ambulatory Visit (INDEPENDENT_AMBULATORY_CARE_PROVIDER_SITE_OTHER): Payer: BC Managed Care – PPO | Admitting: Rehabilitative and Restorative Service Providers"

## 2020-09-10 DIAGNOSIS — R293 Abnormal posture: Secondary | ICD-10-CM

## 2020-09-10 DIAGNOSIS — R2689 Other abnormalities of gait and mobility: Secondary | ICD-10-CM

## 2020-09-10 DIAGNOSIS — M545 Low back pain, unspecified: Secondary | ICD-10-CM

## 2020-09-10 DIAGNOSIS — M6281 Muscle weakness (generalized): Secondary | ICD-10-CM

## 2020-09-10 NOTE — Therapy (Signed)
[place], [place], [address] Phone: [phone]   Fax:  [phone]  Physical Therapy Evaluation  Patient Details  Name: Bonnie [name] MRN: [id number] Date of Birth: 04/12/1951 Referring Provider (PT): Jean Rosenthal, MD   Encounter Date: 09/10/2020   PT End of Session - 09/10/20 1342    Visit Number 1    Number of Visits 12    Date for PT Re-Evaluation 10/22/20    Authorization Type BCBS community PPO    PT Start Time 1020    PT Stop Time 1100    PT Time Calculation (min) 40 min    Activity Tolerance Patient tolerated treatment well    Behavior During Therapy [facility] for tasks assessed/performed           Past Medical History:  Diagnosis Date  . Abnormal mammogram of right breast 01/20/2018  . Anxiety   . Benign cyst of right breast 01/26/2018  . Blood transfusion without reported diagnosis   . Cataract   . Centrilobular emphysema ([place]) 07/25/2018   mild  . 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 Chronic low back pain   . Colon polyps   . Depression   . Diabetes mellitus without complication ([place])   . Emphysema of lung ([place])   . Fatty liver   . Hypertension   . Melanoma ([place])    R upper extremity  . Melanoma in situ of right upper extremity ([place])   . Mild diastolic dysfunction 2/77/4128  . Osteoarthritis of lumbar spine   . Osteoporosis   . Renal cancer ([place])   . Skin cancer   . Sleep apnea 2008   cpap nightly  . Sphincter of Oddi dysfunction    bile duct    Past Surgical History:  Procedure Laterality Date  . ABDOMINAL HYSTERECTOMY    . ANKLE SURGERY Left    in her 34s  . BILE DUCT STENT PLACEMENT    . CARDIAC CATHETERIZATION    . CHOLECYSTECTOMY    . COLONOSCOPY    . LEFT HEART CATH AND CORONARY ANGIOGRAPHY N/A 09/05/2019   Procedure: LEFT HEART CATH AND CORONARY ANGIOGRAPHY;  Surgeon: Leonie Man, MD;  Location: [facility];  Service:  Cardiovascular;  Laterality: N/A;  . LUMBAR FUSION     7 level   . MOHS SURGERY     x 11  . PARTIAL NEPHRECTOMY    . POLYPECTOMY    . TOTAL HIP ARTHROPLASTY Left 03/01/2020   Procedure: LEFT TOTAL HIP ARTHROPLASTY ANTERIOR APPROACH;  Surgeon: Mcarthur Rossetti, MD;  [facility];  Service: Orthopedics;  Laterality: Left;  . UPPER GASTROINTESTINAL ENDOSCOPY      There were no vitals filed for this visit.    Subjective Assessment - 09/10/20 1020    Subjective The patient reports chronic h/o LBP that is worsening.  She reports joints impacted by arthritis with pain.  She is waking with L hip aching in the morning and feels "it is connected" between her low back and L hip.    Pertinent History severe OA, h/o multiple level fusion,  L THR 03/01/20 ,diabetes    Patient Stated Goals reduce pain, build up stamina    Currently in Pain? Yes    Pain Score 8     Pain Location Back    Pain Orientation Left    Pain Descriptors / Indicators Aching;Sore    Pain Type Acute pain;Chronic pain  Pain Radiating Towards radiates into L hip    Pain Onset More than a month ago    Pain Frequency Constant    Aggravating Factors  worse in the morning    Pain Relieving Factors meds    Effect of Pain on Daily Activities can wake her from sleep              [facility] PT Assessment - 09/10/20 1029      Assessment   Medical Diagnosis Midline LBP, S/p L THR    Referring Provider (PT) Jean Rosenthal, MD    Onset Date/Surgical Date 03/01/20    Hand Dominance Right    Prior Therapy None      Precautions   Precautions Fall      Restrictions   Weight Bearing Restrictions No      Balance Screen   Has the patient fallen in the past 6 months No    Has the patient had a decrease in activity level because of a fear of falling?  No    Is the patient reluctant to leave their home because of a fear of falling?  No      Home Environment   Living Environment Private residence    Living  Arrangements Spouse/significant other    Type of [place] Access Level entry    [place] None      Prior Function   Level of [place] Independent      Observation/Other Assessments   Focus on Therapeutic Outcomes (FOTO)  49% limitation      Sensation   Light Touch Appears Intact   some mild numbness in L hip     Posture/Postural Control   Posture/Postural Control Postural limitations    Postural Limitations Rounded Shoulders;Forward head   L hip elevated slightly     ROM / Strength   AROM / PROM / Strength AROM;Strength      AROM   Overall AROM  Deficits    Overall AROM Comments R shoulder gets soreness with gentle AROM; forward head more prevalent during ROM    AROM Assessment Site Lumbar    Lumbar Flexion 50% limitation    Lumbar - Right Side Bend 50% limitation     mid line LBP   Lumbar - Left Side Bend 50% limitation   L hip   Lumbar - Right Rotation 50% limitation    Lumbar - Left Rotation 50% limitation      Strength   Overall Strength Deficits    Strength Assessment Site Hip;Knee;Ankle    Right/Left Hip Right;Left    Right Hip Flexion 5/5    Right Hip ABduction 5/5   pain in L hip   Left Hip Flexion 5/5    Left Hip ABduction 2+/5    Right/Left Knee Right;Left    Right Knee Flexion 5/5    Right Knee Extension 5/5    Left Knee Flexion 5/5    Left Knee Extension 5/5    Right/Left Ankle Right;Left    Right Ankle Dorsiflexion 5/5    Left Ankle Dorsiflexion 5/5      Flexibility   Soft Tissue Assessment /Muscle Length yes    Quadriceps tightness noted during bridges in anterior hips and quads      Palpation   Palpation comment tender to palpation low back; *severe tenderness to palpation L IT Band and lateral hip      Special Tests   Other  special tests able to bridge, gets tightness in quads      Ambulation/Gait   Ambulation/Gait Yes    Ambulation/Gait Assistance 7: Independent    Assistive device None     Gait Pattern Step-through pattern;Trendelenburg;Lateral hip instability      High Level Balance   High Level Balance Comments single leg stance 1 second left and 3 seconds R                      Objective measurements completed on examination: See above findings.       [place] Adult PT Treatment/Exercise - 09/10/20 1029      Self-Care   Self-Care Other Self-Care Comments    Other Self-Care Comments  massage- self tissue mobilization with pastry roller      Exercises   Exercises Knee/Hip      Knee/Hip Exercises: Stretches   Active Hamstring Stretch Right;Left;1 rep;30 seconds      Knee/Hip Exercises: Standing   Heel Raises Both;10 reps    SLS near support surface discussing hips level for glut med activation      Knee/Hip Exercises: Sidelying   Clams L LE x 10 reps                  PT Education - 09/10/20 1125    Education Details HEP initiated    Person(s) Educated Patient    Methods Explanation;Demonstration;Handout    Comprehension Verbalized understanding;Returned demonstration               PT Long Term Goals - 09/10/20 1348      PT LONG TERM GOAL #1   Title The patient will be indep with HEP.    Time 6    Period Weeks    Target Date 10/22/20      PT LONG TERM GOAL #2   Title The patient will reduce functional limitation per FOTO from 49% to 44%.    Time 6    Period Weeks    Target Date 10/22/20      PT LONG TERM GOAL #3   Title The patient will improve L hip abductor strength to 4/5.    Baseline 2+/5    Time 6    Period Weeks    Target Date 10/22/20      PT LONG TERM GOAL #4   Title The patient will maintain L single leg stance x 5 seconds to demo improved control for gait (to reduce trendelenberg).    Time 6    Period Weeks    Target Date 10/22/20      PT LONG TERM GOAL #5   Title The patient will report pain in low back and L hip < or equal to 4/10 in the mornings.    Baseline 8/10    Time 6    Period Weeks     Target Date 10/22/20      Additional Long Term Goals   Additional Long Term Goals Yes      PT LONG TERM GOAL #6   Title The patient will return to vacuuming and/or golf with back pain < or equal to 3/10.    Baseline reports avoiding these activities due to back and hip pain    Time 6    Period Weeks    Target Date 10/22/20                  Plan - 09/10/20 1350    Clinical Impression Statement The patient  is a 69 yo female presenting to OP rehab with chronic low back pain and L hip pain.  She underwent THR 03/01/20 and notes she felt fully recovered and then began waking with low back pain radiating into her hip.  She presents with imapirments in posture, strength (especially L hip abduction), balance, ROM lumbar spine, myofascial tightness L IT band, and pain.   These impairments limit function for IADLs, ADLs, and recreational activities.    Personal Factors and Comorbidities Comorbidity 3+;Time since onset of injury/illness/exacerbation    Comorbidities OA, h/o spinal fusion, diabetes, HTN    Examination-Activity Limitations Squat;Stairs;Stand;Locomotion Level;Sleep    Examination-Participation Restrictions Cleaning;Meal Prep;Community Activity    Stability/Clinical Decision Making Stable/Uncomplicated    Clinical Decision Making Low    Rehab Potential Good    PT Frequency 2x / week    PT Duration 6 weeks    PT Treatment/Interventions ADLs/Self Care Home Management;Gait training;Stair training;Functional mobility training;Therapeutic activities;Therapeutic exercise;Balance training;Neuromuscular re-education;Taping;Patient/family education;Dry needling;Manual techniques;Electrical Stimulation;Iontophoresis 4mg /ml Dexamethasone;Moist Heat;Cryotherapy;Passive range of motion    PT Next Visit Plan STM IASTM L IT Band, glut med/min, DN L IT Band and quad/HS, hip abduction strengthening L, core stability for low back, flexibility LEs, education on body mechanics with ADLs, return to  golf, balance.    PT Home Exercise Plan [email or web address]     Consulted and Agree with Plan of Care Patient           Patient will benefit from skilled therapeutic intervention in order to improve the following deficits and impairments:  Abnormal gait, Pain, Impaired flexibility, Postural dysfunction, Decreased strength, Decreased range of motion, Increased fascial restricitons, Decreased activity tolerance, Decreased balance, Decreased mobility, Obesity  Visit Diagnosis: Muscle weakness (generalized)  Midline low back pain without sciatica, unspecified chronicity  Abnormal posture  Other abnormalities of gait and mobility     Problem List Patient Active Problem List   Diagnosis Date Noted  . Unilateral primary osteoarthritis, left hip 03/01/2020  . Status post total replacement of left hip 03/01/2020  . Acute kidney injury superimposed on CKD ([place]) 09/17/2019  . Insomnia secondary to anxiety 09/17/2019  . Increase in serum creatinine from prior measurement 09/17/2019  . Abnormal nuclear stress test 09/04/2019  . Mild diastolic dysfunction 76/28/3151  . Right bundle branch block 07/05/2019  . Left axis deviation 07/05/2019  . Decreased exercise tolerance 07/05/2019  . Sphincter of Oddi dysfunction 07/05/2019  . Multiple pulmonary nodules determined by computed tomography of lung 07/05/2019  . Abnormal QT interval present on electrocardiogram 07/05/2019  . Irregular heart beats 06/29/2019  . Family history of CHF (congestive heart failure) 06/29/2019  . Recurrent major depressive disorder, in partial remission ([place]) 06/12/2019  . Encounter for diabetic foot exam ([place]) 06/12/2019  . Grief reaction 05/18/2019  . History of colon polyps 01/17/2019  . Encounter for long-term (current) use of high-risk medication 01/17/2019  . Primary osteoarthritis of left hip 12/19/2018  . Left hip pain 10/13/2018  . Centrilobular emphysema ([place]) 07/25/2018  . Closed compression fracture of body of  L1 vertebra ([place]) 07/25/2018  . Renal cyst, acquired, left 07/25/2018  . Primary osteoarthritis involving multiple joints 07/06/2018  . Diabetic eye exam ([place]) 07/06/2018  . History of multiple pulmonary nodules 07/06/2018  . Encounter for screening for lung cancer 07/06/2018  . Type 2 diabetes mellitus with hyperglycemia, without long-term current use of insulin ([place]) 04/04/2018  . Dyslipidemia associated with type 2 diabetes mellitus ([place]) 04/04/2018  . Class 3 severe obesity due to excess  calories with serious comorbidity and body mass index (BMI) of 40.0 to 44.9 in adult ([place]) 04/03/2018  . Prediabetes 04/03/2018  . Encounter for weight loss counseling 04/03/2018  . Onychomycosis 03/28/2018  . Morbid obesity ([place]) 02/28/2018  . Bilateral foot pain 02/28/2018  . Benign cyst of right breast 01/26/2018  . Abnormal mammogram of right breast 01/20/2018  . Chronic kidney disease, stage 3a 12/31/2017  . Encounter for chronic pain management 12/30/2017  . Chronic use of opiate for therapeutic purpose 12/30/2017  . History of melanoma 12/30/2017  . History of renal cell carcinoma 12/30/2017  . Arthritis of carpometacarpal [facility]) joint of left thumb 05/19/2017  . Controlled substance agreement signed 03/30/2017  . H/O acute pancreatitis 07/17/2016  . History of biliary stent insertion 07/17/2016  . Coronary artery calcification seen on CT scan 11/08/2015  . History of back surgery 09/23/2015  . Spondylosis of lumbar region without myelopathy or radiculopathy 08/30/2015  . Primary osteoarthritis of both knees 03/01/2015  . Cellulitis of face 02/19/2015  . Anxiety 07/24/2014  . Basal cell carcinoma 10/04/2013  . Depression, major 10/04/2013  . Squamous cell carcinoma 10/04/2013  . Dyspnea on exertion -> considered to be possible anginal equivalent 04/20/2012  . Essential hypertension 03/29/2012  . Former smoker 03/29/2012  . Gout 03/29/2012  . Hyperlipidemia 03/29/2012  . Palpitations  03/29/2012  . OSA on CPAP 03/29/2012    Ramzy Cappelletti, PT 09/10/2020, 1:57 PM  [facility] [place], [place], [address] Phone: [phone]   Fax:  [phone]  Name: Bonnie [name] MRN: [id number] Date of Birth: 12-15-1951

## 2020-09-10 NOTE — Patient Instructions (Signed)
Access Code: [email or web address]  URL: [email or web address] Date: 09/10/2020 Prepared by: Rudell Cobb  Exercises Roller Massage Elongated IT Band Release - 2 x daily - 7 x weekly - 1 sets - 10 reps Beginner Clam - 2 x daily - 7 x weekly - 1 sets - 10 reps Single Leg Stance with Support - 2 x daily - 7 x weekly - 1 sets - 3 reps - 10-15 seconds hold Standing Heel Raise - 2 x daily - 7 x weekly - 1 sets - 10 reps Seated Hamstring Stretch - 2 x daily - 7 x weekly - 1 sets - 3 reps - 20 seconds hold

## 2020-09-11 DIAGNOSIS — G4733 Obstructive sleep apnea (adult) (pediatric): Secondary | ICD-10-CM | POA: Diagnosis not present

## 2020-09-12 ENCOUNTER — Ambulatory Visit (INDEPENDENT_AMBULATORY_CARE_PROVIDER_SITE_OTHER): Payer: BC Managed Care – PPO | Admitting: Physical Therapy

## 2020-09-12 ENCOUNTER — Other Ambulatory Visit: Payer: Self-pay | Admitting: Osteopathic Medicine

## 2020-09-12 ENCOUNTER — Encounter: Payer: Self-pay | Admitting: Physical Therapy

## 2020-09-12 ENCOUNTER — Other Ambulatory Visit: Payer: Self-pay | Admitting: Physician Assistant

## 2020-09-12 ENCOUNTER — Other Ambulatory Visit: Payer: Self-pay

## 2020-09-12 DIAGNOSIS — M545 Low back pain, unspecified: Secondary | ICD-10-CM

## 2020-09-12 DIAGNOSIS — R293 Abnormal posture: Secondary | ICD-10-CM

## 2020-09-12 DIAGNOSIS — I10 Essential (primary) hypertension: Secondary | ICD-10-CM

## 2020-09-12 DIAGNOSIS — M6281 Muscle weakness (generalized): Secondary | ICD-10-CM

## 2020-09-12 NOTE — Therapy (Signed)
[place], [place], [address] Phone: [phone]   Fax:  [phone]  Physical Therapy Treatment  Patient Details  Name: Bonnie [name] MRN: [id number] Date of Birth: [birth year] Referring Provider (PT): Jean Rosenthal, MD   Encounter Date: 09/12/2020   PT End of Session - 09/12/20 1544    Visit Number 2    Number of Visits 12    Date for PT Re-Evaluation 10/22/20    Authorization Type BCBS community PPO    PT Start Time 8588    PT Stop Time 1617    PT Time Calculation (min) 39 min    Activity Tolerance Patient tolerated treatment well    Behavior During Therapy [facility] for tasks assessed/performed           Past Medical History:  Diagnosis Date  . Abnormal mammogram of right breast 01/20/2018  . Anxiety   . Benign cyst of right breast 01/26/2018  . Blood transfusion without reported diagnosis   . Cataract   . Centrilobular emphysema ([place]) 07/25/2018   mild  . 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 Chronic low back pain   . Colon polyps   . Depression   . Diabetes mellitus without complication ([place])   . Emphysema of lung ([place])   . Fatty liver   . Hypertension   . Melanoma ([place])    R upper extremity  . Melanoma in situ of right upper extremity ([place])   . Mild diastolic dysfunction 04/21/7740  . Osteoarthritis of lumbar spine   . Osteoporosis   . Renal cancer ([place])   . Skin cancer   . Sleep apnea 2008   cpap nightly  . Sphincter of Oddi dysfunction    bile duct    Past Surgical History:  Procedure Laterality Date  . ABDOMINAL HYSTERECTOMY    . ANKLE SURGERY Left    in her 24s  . BILE DUCT STENT PLACEMENT    . CARDIAC CATHETERIZATION    . CHOLECYSTECTOMY    . COLONOSCOPY    . LEFT HEART CATH AND CORONARY ANGIOGRAPHY N/A 09/05/2019   Procedure: LEFT HEART CATH AND CORONARY ANGIOGRAPHY;  Surgeon: Leonie Man, MD;  Location: [place] [facility];  Service:  Cardiovascular;  Laterality: N/A;  . LUMBAR FUSION     7 level   . MOHS SURGERY     x 11  . PARTIAL NEPHRECTOMY    . POLYPECTOMY    . TOTAL HIP ARTHROPLASTY Left 03/01/2020   Procedure: LEFT TOTAL HIP ARTHROPLASTY ANTERIOR APPROACH;  Surgeon: Mcarthur Rossetti, MD;  [facility];  Service: Orthopedics;  Laterality: Left;  . UPPER GASTROINTESTINAL ENDOSCOPY      There were no vitals filed for this visit.   Subjective Assessment - 09/12/20 1538    Subjective Pt brought in TENS for instruction.  Today is a better day (weather has changed).  She has been working on chores around house, so she is more bent over since then.    Patient Stated Goals reduce pain, build up stamina, walk longer.    Currently in Pain? Yes    Pain Score 4     Pain Location Back    Pain Orientation Lower    Pain Descriptors / Indicators Sore;Aching              [facility] PT Assessment - 09/12/20 0001      Assessment   Medical Diagnosis Midline LBP, S/p L  THR    Referring Provider (PT) Jean Rosenthal, MD    Onset Date/Surgical Date 03/01/20    Hand Dominance Right    Next MD Visit 10/12/20    Prior Therapy None            [facility] Adult PT Treatment/Exercise - 09/12/20 0001      Self-Care   Other Self-Care Comments  Pt instructed in safety and application of TENs unit; pt returned demo       Lumbar Exercises: Stretches   Standing Extension 2 reps;5 seconds    Passive Hamstring Stretch Right;Left;2 reps;20 seconds   seated, cues for straight back     Lumbar Exercises: Standing   Other Standing Lumbar Exercises scap retraction x 5 sec x 5 reps with back against noodle       Knee/Hip Exercises: Stretches   Hip Flexor Stretch Right;Left;2 reps   15 sec, leg extended in standing.      Knee/Hip Exercises: Standing   Heel Raises Both;1 set;10 reps    SLS LLE x 5 sec; increased pain - switched to tandem stance with improved tolerance    Other Standing Knee Exercises tandem stance without  UE support x 20 sec each foot forward.       Knee/Hip Exercises: Supine   Bridges Both;1 set;10 reps      Knee/Hip Exercises: Sidelying   Clams each x 10 reps, cues for form.       Shoulder Exercises: Stretch   Wall Stretch - Flexion 5 reps;10 seconds   hands sliding up cabinet   Other Shoulder Stretches midlevel doorway stretch x 20 sec x 2 reps       Modalities   Modalities Electrical Stimulation;Moist Heat      Moist Heat Therapy   Number Minutes Moist Heat 10 Minutes    Moist Heat Location Lumbar Spine      Electrical Stimulation   Electrical Stimulation Location bilat lumbar back     Electrical Stimulation Action TENS (pt's own unit)    Electrical Stimulation Parameters 10 min, intensity to tolernace    Electrical Stimulation Goals Pain                       PT Long Term Goals - 09/10/20 1348      PT LONG TERM GOAL #1   Title The patient will be indep with HEP.    Time 6    Period Weeks    Target Date 10/22/20      PT LONG TERM GOAL #2   Title The patient will reduce functional limitation per FOTO from 49% to 44%.    Time 6    Period Weeks    Target Date 10/22/20      PT LONG TERM GOAL #3   Title The patient will improve L hip abductor strength to 4/5.    Baseline 2+/5    Time 6    Period Weeks    Target Date 10/22/20      PT LONG TERM GOAL #4   Title The patient will maintain L single leg stance x 5 seconds to demo improved control for gait (to reduce trendelenberg).    Time 6    Period Weeks    Target Date 10/22/20      PT LONG TERM GOAL #5   Title The patient will report pain in low back and L hip < or equal to 4/10 in the mornings.    Baseline 8/10  Time 6    Period Weeks    Target Date 10/22/20      Additional Long Term Goals   Additional Long Term Goals Yes      PT LONG TERM GOAL #6   Title The patient will return to vacuuming and/or golf with back pain < or equal to 3/10.    Baseline reports avoiding these activities due to  back and hip pain    Time 6    Period Weeks    Target Date 10/22/20                 Plan - 09/12/20 2011    Clinical Impression Statement Pt reported reduction of overall pain with performance of stretches.  Frequent cues required for upright posture.  Pt verbalized understanding of self application of TENS unit for home.  HEP updated.  Progressing towards goals.    Personal Factors and Comorbidities Comorbidity 3+;Time since onset of injury/illness/exacerbation    Comorbidities OA, h/o spinal fusion, diabetes, HTN    Examination-Activity Limitations Squat;Stairs;Stand;Locomotion Level;Sleep    Examination-Participation Restrictions Cleaning;Meal Prep;Community Activity    Stability/Clinical Decision Making Stable/Uncomplicated    Rehab Potential Good    PT Frequency 2x / week    PT Duration 6 weeks    PT Treatment/Interventions ADLs/Self Care Home Management;Gait training;Stair training;Functional mobility training;Therapeutic activities;Therapeutic exercise;Balance training;Neuromuscular re-education;Taping;Patient/family education;Dry needling;Manual techniques;Electrical Stimulation;Iontophoresis 4mg /ml Dexamethasone;Moist Heat;Cryotherapy;Passive range of motion    PT Next Visit Plan DN L IT Band and quad/HS, hip abduction strengthening L, core stability for low back, flexibility LEs, education on body mechanics with ADLs, return to golf, balance.    PT Home Exercise Plan [email or web address]     Consulted and Agree with Plan of Care Patient           Patient will benefit from skilled therapeutic intervention in order to improve the following deficits and impairments:  Abnormal gait, Pain, Impaired flexibility, Postural dysfunction, Decreased strength, Decreased range of motion, Increased fascial restricitons, Decreased activity tolerance, Decreased balance, Decreased mobility, Obesity  Visit Diagnosis: Muscle weakness (generalized)  Midline low back pain without sciatica, unspecified  chronicity  Abnormal posture     Problem List Patient Active Problem List   Diagnosis Date Noted  . Unilateral primary osteoarthritis, left hip 03/01/2020  . Status post total replacement of left hip 03/01/2020  . Acute kidney injury superimposed on CKD ([place]) 09/17/2019  . Insomnia secondary to anxiety 09/17/2019  . Increase in serum creatinine from prior measurement 09/17/2019  . Abnormal nuclear stress test 09/04/2019  . Mild diastolic dysfunction 16/09/9603  . Right bundle branch block 07/05/2019  . Left axis deviation 07/05/2019  . Decreased exercise tolerance 07/05/2019  . Sphincter of Oddi dysfunction 07/05/2019  . Multiple pulmonary nodules determined by computed tomography of lung 07/05/2019  . Abnormal QT interval present on electrocardiogram 07/05/2019  . Irregular heart beats 06/29/2019  . Family history of CHF (congestive heart failure) 06/29/2019  . Recurrent major depressive disorder, in partial remission ([place]) 06/12/2019  . Encounter for diabetic foot exam ([place]) 06/12/2019  . Grief reaction 05/18/2019  . History of colon polyps 01/17/2019  . Encounter for long-term (current) use of high-risk medication 01/17/2019  . Primary osteoarthritis of left hip 12/19/2018  . Left hip pain 10/13/2018  . Centrilobular emphysema ([place]) 07/25/2018  . Closed compression fracture of body of L1 vertebra ([place]) 07/25/2018  . Renal cyst, acquired, left 07/25/2018  . Primary osteoarthritis involving multiple joints 07/06/2018  . Diabetic eye exam ([place]) 07/06/2018  .  History of multiple pulmonary nodules 07/06/2018  . Encounter for screening for lung cancer 07/06/2018  . Type 2 diabetes mellitus with hyperglycemia, without long-term current use of insulin ([place]) 04/04/2018  . Dyslipidemia associated with type 2 diabetes mellitus ([place]) 04/04/2018  . Class 3 severe obesity due to excess calories with serious comorbidity and body mass index (BMI) of 40.0 to 44.9 in adult ([place]) 04/03/2018    . Prediabetes 04/03/2018  . Encounter for weight loss counseling 04/03/2018  . Onychomycosis 03/28/2018  . Morbid obesity ([place]) 02/28/2018  . Bilateral foot pain 02/28/2018  . Benign cyst of right breast 01/26/2018  . Abnormal mammogram of right breast 01/20/2018  . Chronic kidney disease, stage 3a 12/31/2017  . Encounter for chronic pain management 12/30/2017  . Chronic use of opiate for therapeutic purpose 12/30/2017  . History of melanoma 12/30/2017  . History of renal cell carcinoma 12/30/2017  . Arthritis of carpometacarpal [facility]) joint of left thumb 05/19/2017  . Controlled substance agreement signed 03/30/2017  . H/O acute pancreatitis 07/17/2016  . History of biliary stent insertion 07/17/2016  . Coronary artery calcification seen on CT scan 11/08/2015  . History of back surgery 09/23/2015  . Spondylosis of lumbar region without myelopathy or radiculopathy 08/30/2015  . Primary osteoarthritis of both knees 03/01/2015  . Cellulitis of face 02/19/2015  . Anxiety 07/24/2014  . Basal cell carcinoma 10/04/2013  . Depression, major 10/04/2013  . Squamous cell carcinoma 10/04/2013  . Dyspnea on exertion -> considered to be possible anginal equivalent 04/20/2012  . Essential hypertension 03/29/2012  . Former smoker 03/29/2012  . Gout 03/29/2012  . Hyperlipidemia 03/29/2012  . Palpitations 03/29/2012  . OSA on CPAP 03/29/2012    Shelbie Hutching 09/12/2020, 8:17 PM  [facility] [place], [place], [address] Phone: [phone]   Fax:  [phone]  Name: Bonnie [name] MRN: [id number] Date of Birth: [birth year]

## 2020-09-12 NOTE — Patient Instructions (Signed)
Access Code: [email or web address] URL: [email or web address]: 09/23/2021Prepared by: [place] - 2 x daily - 7 x weekly - 1 sets - 10 reps  Seated Hamstring Stretch - 2 x daily - 7 x weekly - 1 sets - 3 reps - 20 seconds hold  Doorway Pec Stretch at 90 Degrees Abduction - 1 x daily - 7 x weekly - 3 sets - 10 reps  Standing Heel Raise - 2 x daily - 7 x weekly - 1 sets - 10 reps  Standing Shoulder Flexion Stretch on Wall - 1 x daily - 7 x weekly - 1 sets - 3-5 reps  Tandem Stance - 2 x daily - 7 x weekly - 1 sets - 3 reps - 10-15 sec hold  Standing Hip Extension with Counter Support - 1 x daily - 7 x weekly - 1 sets - 2 reps - 20 sec hold  Standing Lumbar Extension - 2 x daily - 7 x weekly - 1 sets - 2-3 reps - 5 sec hold

## 2020-09-17 ENCOUNTER — Ambulatory Visit (INDEPENDENT_AMBULATORY_CARE_PROVIDER_SITE_OTHER): Payer: BC Managed Care – PPO | Admitting: Physical Therapy

## 2020-09-17 ENCOUNTER — Other Ambulatory Visit: Payer: Self-pay

## 2020-09-17 ENCOUNTER — Encounter: Payer: Self-pay | Admitting: Physical Therapy

## 2020-09-17 ENCOUNTER — Encounter: Payer: Self-pay | Admitting: Osteopathic Medicine

## 2020-09-17 DIAGNOSIS — F419 Anxiety disorder, unspecified: Secondary | ICD-10-CM

## 2020-09-17 DIAGNOSIS — R293 Abnormal posture: Secondary | ICD-10-CM

## 2020-09-17 DIAGNOSIS — M545 Low back pain, unspecified: Secondary | ICD-10-CM

## 2020-09-17 DIAGNOSIS — F5105 Insomnia due to other mental disorder: Secondary | ICD-10-CM

## 2020-09-17 DIAGNOSIS — M6281 Muscle weakness (generalized): Secondary | ICD-10-CM

## 2020-09-17 DIAGNOSIS — R2689 Other abnormalities of gait and mobility: Secondary | ICD-10-CM

## 2020-09-17 NOTE — Therapy (Signed)
[place], [place], [address] Phone: [phone]   Fax:  [phone]  Physical Therapy Treatment  Patient Details  Name: Bonnie [name] MRN: [id number] Date of Birth: October 24, 1951 Referring Provider (PT): Jean Rosenthal, MD   Encounter Date: 09/17/2020   PT End of Session - 09/17/20 1155    Visit Number 3    Number of Visits 12    Date for PT Re-Evaluation 10/22/20    Authorization Type BCBS community PPO    PT Start Time 1150    PT Stop Time 1230    PT Time Calculation (min) 40 min    Activity Tolerance Patient tolerated treatment well    Behavior During Therapy [facility] for tasks assessed/performed           Past Medical History:  Diagnosis Date  . Abnormal mammogram of right breast 01/20/2018  . Anxiety   . Benign cyst of right breast 01/26/2018  . Blood transfusion without reported diagnosis   . Cataract   . Centrilobular emphysema ([place]) 07/25/2018   mild  . 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 Chronic low back pain   . Colon polyps   . Depression   . Diabetes mellitus without complication ([place])   . Emphysema of lung ([place])   . Fatty liver   . Hypertension   . Melanoma ([place])    R upper extremity  . Melanoma in situ of right upper extremity ([place])   . Mild diastolic dysfunction 9/48/5462  . Osteoarthritis of lumbar spine   . Osteoporosis   . Renal cancer ([place])   . Skin cancer   . Sleep apnea 2008   cpap nightly  . Sphincter of Oddi dysfunction    bile duct    Past Surgical History:  Procedure Laterality Date  . ABDOMINAL HYSTERECTOMY    . ANKLE SURGERY Left    in her 66s  . BILE DUCT STENT PLACEMENT    . CARDIAC CATHETERIZATION    . CHOLECYSTECTOMY    . COLONOSCOPY    . LEFT HEART CATH AND CORONARY ANGIOGRAPHY N/A 09/05/2019   Procedure: LEFT HEART CATH AND CORONARY ANGIOGRAPHY;  Surgeon: Leonie Man, MD;  Location: [place] [facility];  Service:  Cardiovascular;  Laterality: N/A;  . LUMBAR FUSION     7 level   . MOHS SURGERY     x 11  . PARTIAL NEPHRECTOMY    . POLYPECTOMY    . TOTAL HIP ARTHROPLASTY Left 03/01/2020   Procedure: LEFT TOTAL HIP ARTHROPLASTY ANTERIOR APPROACH;  Surgeon: Mcarthur Rossetti, MD;  Location: [facility];  Service: Orthopedics;  Laterality: Left;  . UPPER GASTROINTESTINAL ENDOSCOPY      There were no vitals filed for this visit.   Subjective Assessment - 09/17/20 1156    Subjective Pt reports she did her exercises some, but no as much as she would have liked.  She hasn't used TENS unit since last visit.  She did a lot of sitting this weekend, so back pain wasn't as present.    Pertinent History severe OA, h/o multiple level fusion,  L THR 03/01/20 ,diabetes    Patient Stated Goals reduce pain, build up stamina, walk longer.    Currently in Pain? Yes    Pain Score 6    5/10 is baseline   Pain Location Knee    Pain Orientation Left;Right    Pain Descriptors / Indicators Sore  [facility] PT Assessment - 09/17/20 0001      Assessment   Medical Diagnosis Midline LBP, S/p L THR    Referring Provider (PT) Jean Rosenthal, MD    Onset Date/Surgical Date 03/01/20    Hand Dominance Right    Next MD Visit 10/12/20    Prior Therapy None            [facility] Adult PT Treatment/Exercise - 09/17/20 0001      Knee/Hip Exercises: Stretches   Passive Hamstring Stretch Right;Left;2 reps;20 seconds   seated, cues for straight back   Quad Stretch Right;Left;2 reps;20 seconds   seated, one foot under chair   Hip Flexor Stretch Right;Left;2 reps   15 sec, leg extended in standing.      Knee/Hip Exercises: Aerobic   Nustep L5: 6 min - arms/legs       Knee/Hip Exercises: Standing   Heel Raises Both;1 set;20 reps    Heel Raises Limitations cues to push weight up towards great toe     Hip Abduction Stengthening;Left;Right;1 set;10 reps;Knee straight    Abduction Limitations hands on blocks on  counter for improved posture during exercise    Hip Extension Stengthening;Right;Left;1 set;10 reps;Knee straight    Other Standing Knee Exercises tandem stance without UE support x 20 sec each foot forward. x 2 reps.  tandem gait with light UE support on counter x 8 ft x 4 reps.  Retro gait x 8 ft x 4 reps with intermittent UE on counter      Shoulder Exercises: Standing   Row Both;10 reps;Strengthening    Theraband Level (Shoulder Row) Level 3 (Green)    Retraction Strengthening;5 reps   5 sec hold; back against noodle     Shoulder Exercises: Stretch   Wall Stretch - Flexion 3 reps;10 seconds    Other Shoulder Stretches midlevel doorway stretch x 15-20 sec x 3 reps                        PT Long Term Goals - 09/10/20 1348      PT LONG TERM GOAL #1   Title The patient will be indep with HEP.    Time 6    Period Weeks    Target Date 10/22/20      PT LONG TERM GOAL #2   Title The patient will reduce functional limitation per FOTO from 49% to 44%.    Time 6    Period Weeks    Target Date 10/22/20      PT LONG TERM GOAL #3   Title The patient will improve L hip abductor strength to 4/5.    Baseline 2+/5    Time 6    Period Weeks    Target Date 10/22/20      PT LONG TERM GOAL #4   Title The patient will maintain L single leg stance x 5 seconds to demo improved control for gait (to reduce trendelenberg).    Time 6    Period Weeks    Target Date 10/22/20      PT LONG TERM GOAL #5   Title The patient will report pain in low back and L hip < or equal to 4/10 in the mornings.    Baseline 8/10    Time 6    Period Weeks    Target Date 10/22/20      Additional Long Term Goals   Additional Long Term Goals Yes      PT  LONG TERM GOAL #6   Title The patient will return to vacuuming and/or golf with back pain < or equal to 3/10.    Baseline reports avoiding these activities due to back and hip pain    Time 6    Period Weeks    Target Date 10/22/20                  Plan - 09/17/20 1221    Clinical Impression Statement Overall positive response to exercises thus far.  Pt observed walking with more upright posture.  She required intermittent cues for upright posture during exercises.  She tolerated all new exercises today and reported improved knee pain at end of session.    Personal Factors and Comorbidities Comorbidity 3+;Time since onset of injury/illness/exacerbation    Comorbidities OA, h/o spinal fusion, diabetes, HTN    Examination-Activity Limitations Squat;Stairs;Stand;Locomotion Level;Sleep    Examination-Participation Restrictions Cleaning;Meal Prep;Community Activity    Stability/Clinical Decision Making Stable/Uncomplicated    Rehab Potential Good    PT Frequency 2x / week    PT Duration 6 weeks    PT Treatment/Interventions ADLs/Self Care Home Management;Gait training;Stair training;Functional mobility training;Therapeutic activities;Therapeutic exercise;Balance training;Neuromuscular re-education;Taping;Patient/family education;Dry needling;Manual techniques;Electrical Stimulation;Iontophoresis 4mg /ml Dexamethasone;Moist Heat;Cryotherapy;Passive range of motion    PT Next Visit Plan abduction strengthening L, core stability for low back, flexibility LEs, education on body mechanics with ADLs.    PT Home Exercise Plan [email or web address]     Consulted and Agree with Plan of Care Patient           Patient will benefit from skilled therapeutic intervention in order to improve the following deficits and impairments:  Abnormal gait, Pain, Impaired flexibility, Postural dysfunction, Decreased strength, Decreased range of motion, Increased fascial restricitons, Decreased activity tolerance, Decreased balance, Decreased mobility, Obesity  Visit Diagnosis: Muscle weakness (generalized)  Midline low back pain without sciatica, unspecified chronicity  Abnormal posture  Other abnormalities of gait and mobility     Problem List Patient  Active Problem List   Diagnosis Date Noted  . Unilateral primary osteoarthritis, left hip 03/01/2020  . Status post total replacement of left hip 03/01/2020  . Acute kidney injury superimposed on CKD ([place]) 09/17/2019  . Insomnia secondary to anxiety 09/17/2019  . Increase in serum creatinine from prior measurement 09/17/2019  . Abnormal nuclear stress test 09/04/2019  . Mild diastolic dysfunction 81/82/9937  . Right bundle branch block 07/05/2019  . Left axis deviation 07/05/2019  . Decreased exercise tolerance 07/05/2019  . Sphincter of Oddi dysfunction 07/05/2019  . Multiple pulmonary nodules determined by computed tomography of lung 07/05/2019  . Abnormal QT interval present on electrocardiogram 07/05/2019  . Irregular heart beats 06/29/2019  . Family history of CHF (congestive heart failure) 06/29/2019  . Recurrent major depressive disorder, in partial remission ([place]) 06/12/2019  . Encounter for diabetic foot exam ([place]) 06/12/2019  . Grief reaction 05/18/2019  . History of colon polyps 01/17/2019  . Encounter for long-term (current) use of high-risk medication 01/17/2019  . Primary osteoarthritis of left hip 12/19/2018  . Left hip pain 10/13/2018  . Centrilobular emphysema ([place]) 07/25/2018  . Closed compression fracture of body of L1 vertebra ([place]) 07/25/2018  . Renal cyst, acquired, left 07/25/2018  . Primary osteoarthritis involving multiple joints 07/06/2018  . Diabetic eye exam ([place]) 07/06/2018  . History of multiple pulmonary nodules 07/06/2018  . Encounter for screening for lung cancer 07/06/2018  . Type 2 diabetes mellitus with hyperglycemia, without long-term current use of insulin ([place]) 04/04/2018  .  Dyslipidemia associated with type 2 diabetes mellitus ([place]) 04/04/2018  . Class 3 severe obesity due to excess calories with serious comorbidity and body mass index (BMI) of 40.0 to 44.9 in adult ([place]) 04/03/2018  . Prediabetes 04/03/2018  . Encounter for weight loss  counseling 04/03/2018  . Onychomycosis 03/28/2018  . Morbid obesity ([place]) 02/28/2018  . Bilateral foot pain 02/28/2018  . Benign cyst of right breast 01/26/2018  . Abnormal mammogram of right breast 01/20/2018  . Chronic kidney disease, stage 3a 12/31/2017  . Encounter for chronic pain management 12/30/2017  . Chronic use of opiate for therapeutic purpose 12/30/2017  . History of melanoma 12/30/2017  . History of renal cell carcinoma 12/30/2017  . Arthritis of carpometacarpal [facility]) joint of left thumb 05/19/2017  . Controlled substance agreement signed 03/30/2017  . H/O acute pancreatitis 07/17/2016  . History of biliary stent insertion 07/17/2016  . Coronary artery calcification seen on CT scan 11/08/2015  . History of back surgery 09/23/2015  . Spondylosis of lumbar region without myelopathy or radiculopathy 08/30/2015  . Primary osteoarthritis of both knees 03/01/2015  . Cellulitis of face 02/19/2015  . Anxiety 07/24/2014  . Basal cell carcinoma 10/04/2013  . Depression, major 10/04/2013  . Squamous cell carcinoma 10/04/2013  . Dyspnea on exertion -> considered to be possible anginal equivalent 04/20/2012  . Essential hypertension 03/29/2012  . Former smoker 03/29/2012  . Gout 03/29/2012  . Hyperlipidemia 03/29/2012  . Palpitations 03/29/2012  . OSA on CPAP 03/29/2012   Kerin Perna, PTA 09/17/20 12:56 PM  [place], [place], [address] Phone: [phone]   Fax:  [phone]  Name: Aditi Rovira MRN: [id number] Date of Birth: [birth year]

## 2020-09-18 MED ORDER — ALPRAZOLAM 0.25 MG PO TABS: 0.2500 mg | ORAL_TABLET | Freq: Three times a day (TID) | ORAL | 0 refills | Status: DC | PRN

## 2020-09-18 NOTE — Telephone Encounter (Signed)
Medication refill request Last refill-  09/20/19 Last ov- 06/26/2020

## 2020-09-19 ENCOUNTER — Encounter: Payer: BC Managed Care – PPO | Admitting: Physical Therapy

## 2020-09-25 ENCOUNTER — Other Ambulatory Visit: Payer: Self-pay

## 2020-09-25 ENCOUNTER — Ambulatory Visit (INDEPENDENT_AMBULATORY_CARE_PROVIDER_SITE_OTHER): Payer: BC Managed Care – PPO | Admitting: Physical Therapy

## 2020-09-25 DIAGNOSIS — M545 Low back pain, unspecified: Secondary | ICD-10-CM

## 2020-09-25 DIAGNOSIS — M6281 Muscle weakness (generalized): Secondary | ICD-10-CM | POA: Diagnosis not present

## 2020-09-25 DIAGNOSIS — R293 Abnormal posture: Secondary | ICD-10-CM

## 2020-09-25 NOTE — Therapy (Addendum)
[place], [place], [address] Phone: [phone]   Fax:  [phone]  Physical Therapy Treatment and Discharge Summary  Patient Details  Name: Bonnie [name] MRN: [id number] Date of Birth: 1951-10-12 Referring Provider (PT): Jean Rosenthal, MD   Encounter Date: 09/25/2020   PT End of Session - 09/25/20 1355    Visit Number 4    Number of Visits 12    Date for PT Re-Evaluation 10/22/20    Authorization Type BCBS community PPO    PT Start Time 4680    PT Stop Time 1428    PT Time Calculation (min) 41 min    Activity Tolerance Patient tolerated treatment well    Behavior During Therapy [facility] for tasks assessed/performed           Past Medical History:  Diagnosis Date  . Abnormal mammogram of right breast 01/20/2018  . Anxiety   . Benign cyst of right breast 01/26/2018  . Blood transfusion without reported diagnosis   . Cataract   . Centrilobular emphysema ([place]) 07/25/2018   mild  . 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 Chronic low back pain   . Colon polyps   . Depression   . Diabetes mellitus without complication ([place])   . Emphysema of lung ([place])   . Fatty liver   . Hypertension   . Melanoma ([place])    R upper extremity  . Melanoma in situ of right upper extremity ([place])   . Mild diastolic dysfunction 03/11/2247  . Osteoarthritis of lumbar spine   . Osteoporosis   . Renal cancer ([place])   . Skin cancer   . Sleep apnea 2008   cpap nightly  . Sphincter of Oddi dysfunction    bile duct    Past Surgical History:  Procedure Laterality Date  . ABDOMINAL HYSTERECTOMY    . ANKLE SURGERY Left    in her 66s  . BILE DUCT STENT PLACEMENT    . CARDIAC CATHETERIZATION    . CHOLECYSTECTOMY    . COLONOSCOPY    . LEFT HEART CATH AND CORONARY ANGIOGRAPHY N/A 09/05/2019   Procedure: LEFT HEART CATH AND CORONARY ANGIOGRAPHY;  Surgeon: Leonie Man, MD;  Location: [facility];  Service: Cardiovascular;  Laterality: N/A;  . LUMBAR FUSION     7 level   . MOHS SURGERY     x 11  . PARTIAL NEPHRECTOMY    . POLYPECTOMY    . TOTAL HIP ARTHROPLASTY Left 03/01/2020   Procedure: LEFT TOTAL HIP ARTHROPLASTY ANTERIOR APPROACH;  Surgeon: Mcarthur Rossetti, MD;  Location: [facility];  Service: Orthopedics;  Laterality: Left;  . UPPER GASTROINTESTINAL ENDOSCOPY      There were no vitals filed for this visit.   Subjective Assessment - 09/25/20 1355    Subjective "I feel good",  but Rt shoulder has been sore with abdct since doorway stretch over a week ago.  She reports her back pain varies day to day.  She notes that her Lt hip is not bothering her.    Pertinent History severe OA, h/o multiple level fusion,  L THR 03/01/20 ,diabetes    Currently in Pain? Yes    Pain Score 3     Pain Location Back    Pain Orientation Right;Left    Pain Descriptors / Indicators Sore              [facility] PT Assessment - 09/25/20 0001  Assessment   Medical Diagnosis Midline LBP, S/p L THR    Referring Provider (PT) Jean Rosenthal, MD    Onset Date/Surgical Date 03/01/20    Hand Dominance Right    Next MD Visit 10/12/20    Prior Therapy None           [facility] Adult PT Treatment/Exercise - 09/25/20 0001      Knee/Hip Exercises: Stretches   Passive Hamstring Stretch Right;Left;2 reps;20 seconds   seated, cues for straight back   Quad Stretch Right;Left;2 reps;20 seconds   seated, one foot under chair     Knee/Hip Exercises: Aerobic   Nustep L5: 6.5 min - arms/legs       Knee/Hip Exercises: Standing   Hip Abduction Stengthening;Left;Right;1 set;10 reps;Knee straight    Hip Extension Stengthening;Right;Left;1 set;10 reps;Knee straight    Forward Step Up Right;Left;1 set;5 reps;Hand Hold: 2;Step Height: 6"    Forward Step Up Limitations painful ascending with RLE    Other Standing Knee Exercises retro gait at counter, with light UE support on counter x 4  reps of 10 ft.       Shoulder Exercises: Standing   Row Both;10 reps;Strengthening    Theraband Level (Shoulder Row) Level 2 (Red)      Shoulder Exercises: Stretch   Internal Rotation Stretch 2 reps   15 sec, with strap, arm behind back.    Table Stretch -Flexion Limitations L stretch in standing with hands on railing x 10 sec x 3 reps    Table Stretch - Abduction 5 reps   RUE    Other Shoulder Stretches Rt/Lt pec stretch at wall x 15 sec                        PT Long Term Goals - 09/10/20 1348      PT LONG TERM GOAL #1   Title The patient will be indep with HEP.    Time 6    Period Weeks    Target Date 10/22/20      PT LONG TERM GOAL #2   Title The patient will reduce functional limitation per FOTO from 49% to 44%.    Time 6    Period Weeks    Target Date 10/22/20      PT LONG TERM GOAL #3   Title The patient will improve L hip abductor strength to 4/5.    Baseline 2+/5    Time 6    Period Weeks    Target Date 10/22/20      PT LONG TERM GOAL #4   Title The patient will maintain L single leg stance x 5 seconds to demo improved control for gait (to reduce trendelenberg).    Time 6    Period Weeks    Target Date 10/22/20      PT LONG TERM GOAL #5   Title The patient will report pain in low back and L hip < or equal to 4/10 in the mornings.    Baseline 8/10    Time 6    Period Weeks    Target Date 10/22/20      Additional Long Term Goals   Additional Long Term Goals Yes      PT LONG TERM GOAL #6   Title The patient will return to vacuuming and/or golf with back pain < or equal to 3/10.    Baseline reports avoiding these activities due to back and hip pain    Time 6  Period Weeks    Target Date 10/22/20                 Plan - 09/25/20 1421    Clinical Impression Statement Pt reporting overall improvement in LBP in upright position.  Pt unable to tolerate doorway stretch, but was able to tolerate straight arm-wall pec stretch.  Pt  requiring less frequent cues for upright posture during session.  Pt reported no increase in pain in shoulder/low back with exercises. Progressing well towards goals.    Personal Factors and Comorbidities Comorbidity 3+;Time since onset of injury/illness/exacerbation    Comorbidities OA, h/o spinal fusion, diabetes, HTN    Examination-Activity Limitations Squat;Stairs;Stand;Locomotion Level;Sleep    Examination-Participation Restrictions Cleaning;Meal Prep;Community Activity    Stability/Clinical Decision Making Stable/Uncomplicated    Rehab Potential Good    PT Frequency 2x / week    PT Duration 6 weeks    PT Treatment/Interventions ADLs/Self Care Home Management;Gait training;Stair training;Functional mobility training;Therapeutic activities;Therapeutic exercise;Balance training;Neuromuscular re-education;Taping;Patient/family education;Dry needling;Manual techniques;Electrical Stimulation;Iontophoresis 40m/ml Dexamethasone;Moist Heat;Cryotherapy;Passive range of motion    PT Next Visit Plan abduction strengthening L, core stability for low back, flexibility LEs, education on body mechanics with ADLs.    PT Home Exercise Plan [email or web address]    Consulted and Agree with Plan of Care Patient           Patient will benefit from skilled therapeutic intervention in order to improve the following deficits and impairments:  Abnormal gait, Pain, Impaired flexibility, Postural dysfunction, Decreased strength, Decreased range of motion, Increased fascial restricitons, Decreased activity tolerance, Decreased balance, Decreased mobility, Obesity  Visit Diagnosis: Muscle weakness (generalized)  Midline low back pain without sciatica, unspecified chronicity  Abnormal posture     Problem List Patient Active Problem List   Diagnosis Date Noted  . Unilateral primary osteoarthritis, left hip 03/01/2020  . Status post total replacement of left hip 03/01/2020  . Acute kidney injury superimposed on CKD  ([place] 09/17/2019  . Insomnia secondary to anxiety 09/17/2019  . Increase in serum creatinine from prior measurement 09/17/2019  . Abnormal nuclear stress test 09/04/2019  . Mild diastolic dysfunction 014/48/1856 . Right bundle branch block 07/05/2019  . Left axis deviation 07/05/2019  . Decreased exercise tolerance 07/05/2019  . Sphincter of Oddi dysfunction 07/05/2019  . Multiple pulmonary nodules determined by computed tomography of lung 07/05/2019  . Abnormal QT interval present on electrocardiogram 07/05/2019  . Irregular heart beats 06/29/2019  . Family history of CHF (congestive heart failure) 06/29/2019  . Recurrent major depressive disorder, in partial remission ([place] 06/12/2019  . Encounter for diabetic foot exam ([place] 06/12/2019  . Grief reaction 05/18/2019  . History of colon polyps 01/17/2019  . Encounter for long-term (current) use of high-risk medication 01/17/2019  . Primary osteoarthritis of left hip 12/19/2018  . Left hip pain 10/13/2018  . Centrilobular emphysema ([place] 07/25/2018  . Closed compression fracture of body of L1 vertebra ([facility] 07/25/2018  . Renal cyst, acquired, left 07/25/2018  . Primary osteoarthritis involving multiple joints 07/06/2018  . Diabetic eye exam ([facility] 07/06/2018  . History of multiple pulmonary nodules 07/06/2018  . Encounter for screening for lung cancer 07/06/2018  . Type 2 diabetes mellitus with hyperglycemia, without long-term current use of insulin ([place] 04/04/2018  . Dyslipidemia associated with type 2 diabetes mellitus ([facility] 04/04/2018  . Class 3 severe obesity due to excess calories with serious comorbidity and body mass index (BMI) of 40.0 to 44.9 in adult ([facility] 04/03/2018  . Prediabetes 04/03/2018  .  Encounter for weight loss counseling 04/03/2018  . Onychomycosis 03/28/2018  . Morbid obesity ([place]) 02/28/2018  . Bilateral foot pain 02/28/2018  . Benign cyst of right breast 01/26/2018  . Abnormal mammogram of right breast  01/20/2018  . Chronic kidney disease, stage 3a ([place]) 12/31/2017  . Encounter for chronic pain management 12/30/2017  . Chronic use of opiate for therapeutic purpose 12/30/2017  . History of melanoma 12/30/2017  . History of renal cell carcinoma 12/30/2017  . Arthritis of carpometacarpal [facility]) joint of left thumb 05/19/2017  . Controlled substance agreement signed 03/30/2017  . H/O acute pancreatitis 07/17/2016  . History of biliary stent insertion 07/17/2016  . Coronary artery calcification seen on CT scan 11/08/2015  . History of back surgery 09/23/2015  . Spondylosis of lumbar region without myelopathy or radiculopathy 08/30/2015  . Primary osteoarthritis of both knees 03/01/2015  . Cellulitis of face 02/19/2015  . Anxiety 07/24/2014  . Basal cell carcinoma 10/04/2013  . Depression, major 10/04/2013  . Squamous cell carcinoma 10/04/2013  . Dyspnea on exertion -> considered to be possible anginal equivalent 04/20/2012  . Essential hypertension 03/29/2012  . Former smoker 03/29/2012  . Gout 03/29/2012  . Hyperlipidemia 03/29/2012  . Palpitations 03/29/2012  . OSA on CPAP 03/29/2012   PHYSICAL THERAPY DISCHARGE SUMMARY  Visits from Start of Care: 4  Current functional level related to goals / functional outcomes: Patient did not return/ goal status not known.   Remaining deficits: See note above for last known status   Education / Equipment: HEP  Plan: Patient agrees to discharge.  Patient goals were not met. Patient is being discharged due to not returning since the last visit.  ?????         Thank you for the referral of this patient. Rudell Cobb, MPT Kerin Perna, [place] 09/25/20 4:50 PM  [facility] [place], [place], [address] Phone: [phone]   Fax:  [phone]  Name: Bonnie [name] MRN: [id number] Date of Birth: 22-Aug-1951

## 2020-09-30 ENCOUNTER — Encounter: Payer: BC Managed Care – PPO | Admitting: Rehabilitative and Restorative Service Providers"

## 2020-10-02 ENCOUNTER — Other Ambulatory Visit: Payer: Self-pay | Admitting: Physician Assistant

## 2020-10-02 DIAGNOSIS — F331 Major depressive disorder, recurrent, moderate: Secondary | ICD-10-CM

## 2020-10-03 ENCOUNTER — Encounter: Payer: BC Managed Care – PPO | Admitting: Physical Therapy

## 2020-10-03 ENCOUNTER — Other Ambulatory Visit: Payer: Self-pay

## 2020-10-03 ENCOUNTER — Telehealth: Payer: Self-pay | Admitting: Osteopathic Medicine

## 2020-10-03 DIAGNOSIS — I10 Essential (primary) hypertension: Secondary | ICD-10-CM

## 2020-10-03 DIAGNOSIS — E1165 Type 2 diabetes mellitus with hyperglycemia: Secondary | ICD-10-CM

## 2020-10-03 DIAGNOSIS — D509 Iron deficiency anemia, unspecified: Secondary | ICD-10-CM

## 2020-10-03 DIAGNOSIS — Z0000 Encounter for general adult medical examination without abnormal findings: Secondary | ICD-10-CM

## 2020-10-03 DIAGNOSIS — M1612 Unilateral primary osteoarthritis, left hip: Secondary | ICD-10-CM

## 2020-10-03 MED ORDER — HYDROCODONE-ACETAMINOPHEN 5-325 MG PO TABS: 1.0000 | ORAL_TABLET | Freq: Three times a day (TID) | ORAL | 0 refills | Status: DC | PRN

## 2020-10-03 NOTE — Telephone Encounter (Signed)
PT scheduled for medicare wellness. Please put in bloodwork beforehand.

## 2020-10-03 NOTE — Telephone Encounter (Signed)
Bonnie [name] is requesting a refill on Hydrocodone. She will come by tomorrow and sign the treatment contract.

## 2020-10-04 ENCOUNTER — Encounter: Payer: Self-pay | Admitting: Osteopathic Medicine

## 2020-10-04 MED ORDER — HYDROCODONE-ACETAMINOPHEN 5-325 MG PO TABS: 1.0000 | ORAL_TABLET | Freq: Three times a day (TID) | ORAL | 0 refills | Status: DC | PRN

## 2020-10-04 NOTE — Telephone Encounter (Signed)
See pended labs  Patient does have history of infusions/anemia, unsure if you wanted to add any iron panels. Most recent iron panel in July was normal

## 2020-10-04 NOTE — Addendum Note (Signed)
Addended by: Maryla Morrow on: 10/04/2020 12:00 PM   Modules accepted: Orders

## 2020-10-04 NOTE — Telephone Encounter (Signed)
Orders updated, thanks for double checking!

## 2020-10-04 NOTE — Telephone Encounter (Signed)
Patient advised orders placed and to  have done fasting

## 2020-10-08 DIAGNOSIS — G4733 Obstructive sleep apnea (adult) (pediatric): Secondary | ICD-10-CM | POA: Diagnosis not present

## 2020-10-08 DIAGNOSIS — R0600 Dyspnea, unspecified: Secondary | ICD-10-CM | POA: Diagnosis not present

## 2020-10-08 DIAGNOSIS — Z9989 Dependence on other enabling machines and devices: Secondary | ICD-10-CM | POA: Diagnosis not present

## 2020-10-08 DIAGNOSIS — R918 Other nonspecific abnormal finding of lung field: Secondary | ICD-10-CM | POA: Diagnosis not present

## 2020-10-14 ENCOUNTER — Other Ambulatory Visit: Payer: Self-pay

## 2020-10-14 ENCOUNTER — Ambulatory Visit: Payer: Self-pay

## 2020-10-14 ENCOUNTER — Ambulatory Visit (INDEPENDENT_AMBULATORY_CARE_PROVIDER_SITE_OTHER): Payer: BC Managed Care – PPO | Admitting: Physician Assistant

## 2020-10-14 ENCOUNTER — Encounter: Payer: Self-pay | Admitting: Orthopaedic Surgery

## 2020-10-14 DIAGNOSIS — M25511 Pain in right shoulder: Secondary | ICD-10-CM

## 2020-10-14 DIAGNOSIS — G8929 Other chronic pain: Secondary | ICD-10-CM | POA: Diagnosis not present

## 2020-10-14 DIAGNOSIS — M545 Low back pain, unspecified: Secondary | ICD-10-CM | POA: Diagnosis not present

## 2020-10-14 NOTE — Progress Notes (Signed)
Office Visit Note   Patient: Bonnie [name]           Date of Birth: 1951/09/26           MRN: [id number] Visit Date: 10/14/2020              Requested by: Emeterio Reeve, [place] [address] [place],  [place] [address] PCP: Emeterio Reeve, DO   Assessment & Plan: Visit Diagnoses:  1. Chronic midline low back pain without sciatica   2. Acute pain of right shoulder     Plan: She will continue to work on stretching exercises that shown by physical therapy for her back.  In regards to her shoulder she is shown [place]man, pendulum, wall crawls and forward flexion exercises for the shoulder.  We will see her back on an as-needed basis.  Questions were encouraged and answered at length.  Follow-Up Instructions: No follow-ups on file.   Orders:  Orders Placed This Encounter  Procedures  . XR Lumbar Spine 2-3 Views   No orders of the defined types were placed in this encounter.     Procedures: No procedures performed   Clinical Data: No additional findings.   Subjective: Chief Complaint  Patient presents with  . Lower Back - Pain, Follow-up  . Left Hip - Follow-up    HPI Bonnie [name] returns today follow-up of her low back pain.  She states that physical therapy has been helpful.  She is having no radicular symptoms down either leg.  Pain is worse at night whenever she lies down.  Unfortunately she went to physical therapy and did tweak her right shoulder.  She was shown some wall crawl exercises by therapy and forward flexion exercises by therapy for the shoulder she has feels that the shoulder is slightly improved.  Unfortunately her husband's been recently diagnosed with a rectal cancer and they are going through the staging process for this. Review of Systems See HPI otherwise negative  Objective: Vital Signs: There were no vitals taken for this visit.  Physical Exam General: Well-developed well-nourished female no acute distress. Ortho Exam Lumbar spine:  Negative straight leg raise bilaterally. Right shoulder she has 5-5 strength with external/internal rotation against resistance.  External rotation of the right shoulder does cause some discomfort.  Negative impingement testing on the right.  Able to bring her right arm over her head fully.  Otherwise fluid range of motion of the shoulder. Specialty Comments:  No specialty comments available.  Imaging: XR Lumbar Spine 2-3 Views  Result Date: 10/14/2020 Lumbar spine 2 views: No acute fractures.  Back surgery with retained hardware from mid and lower thoracic spine to L3.  L1 compression fracture unchanged from previous imaging.  Lower lumbar facet changes otherwise no acute findings.    PMFS History: Patient Active Problem List   Diagnosis Date Noted  . Unilateral primary osteoarthritis, left hip 03/01/2020  . Status post total replacement of left hip 03/01/2020  . Acute kidney injury superimposed on CKD ([place]) 09/17/2019  . Insomnia secondary to anxiety 09/17/2019  . Increase in serum creatinine from prior measurement 09/17/2019  . Abnormal nuclear stress test 09/04/2019  . Mild diastolic dysfunction 02/63/7858  . Right bundle branch block 07/05/2019  . Left axis deviation 07/05/2019  . Decreased exercise tolerance 07/05/2019  . Sphincter of Oddi dysfunction 07/05/2019  . Multiple pulmonary nodules determined by computed tomography of lung 07/05/2019  . Abnormal QT interval present on electrocardiogram 07/05/2019  . Irregular heart beats  06/29/2019  . Family history of CHF (congestive heart failure) 06/29/2019  . Recurrent major depressive disorder, in partial remission ([place]) 06/12/2019  . Encounter for diabetic foot exam ([place]) 06/12/2019  . Grief reaction 05/18/2019  . History of colon polyps 01/17/2019  . Encounter for long-term (current) use of high-risk medication 01/17/2019  . Primary osteoarthritis of left hip 12/19/2018  . Left hip pain 10/13/2018  . Centrilobular  emphysema ([place]) 07/25/2018  . Closed compression fracture of body of L1 vertebra ([place]) 07/25/2018  . Renal cyst, acquired, left 07/25/2018  . Primary osteoarthritis involving multiple joints 07/06/2018  . Diabetic eye exam ([place]) 07/06/2018  . History of multiple pulmonary nodules 07/06/2018  . Encounter for screening for lung cancer 07/06/2018  . Type 2 diabetes mellitus with hyperglycemia, without long-term current use of insulin ([place]) 04/04/2018  . Dyslipidemia associated with type 2 diabetes mellitus ([place]) 04/04/2018  . Class 3 severe obesity due to excess calories with serious comorbidity and body mass index (BMI) of 40.0 to 44.9 in adult ([place]) 04/03/2018  . Prediabetes 04/03/2018  . Encounter for weight loss counseling 04/03/2018  . Onychomycosis 03/28/2018  . Morbid obesity ([place]) 02/28/2018  . Bilateral foot pain 02/28/2018  . Benign cyst of right breast 01/26/2018  . Abnormal mammogram of right breast 01/20/2018  . Chronic kidney disease, stage 3a ([place]) 12/31/2017  . Encounter for chronic pain management 12/30/2017  . Chronic use of opiate for therapeutic purpose 12/30/2017  . History of melanoma 12/30/2017  . History of renal cell carcinoma 12/30/2017  . Arthritis of carpometacarpal [facility]) joint of left thumb 05/19/2017  . Controlled substance agreement signed 03/30/2017  . H/O acute pancreatitis 07/17/2016  . History of biliary stent insertion 07/17/2016  . Coronary artery calcification seen on CT scan 11/08/2015  . History of back surgery 09/23/2015  . Spondylosis of lumbar region without myelopathy or radiculopathy 08/30/2015  . Primary osteoarthritis of both knees 03/01/2015  . Cellulitis of face 02/19/2015  . Anxiety 07/24/2014  . Basal cell carcinoma 10/04/2013  . Depression, major 10/04/2013  . Squamous cell carcinoma 10/04/2013  . Dyspnea on exertion -> considered to be possible anginal equivalent 04/20/2012  . Essential hypertension 03/29/2012  . Former smoker  03/29/2012  . Gout 03/29/2012  . Hyperlipidemia 03/29/2012  . Palpitations 03/29/2012  . OSA on CPAP 03/29/2012   Past Medical History:  Diagnosis Date  . Abnormal mammogram of right breast 01/20/2018  . Anxiety   . Benign cyst of right breast 01/26/2018  . Blood transfusion without reported diagnosis   . Cataract   . Centrilobular emphysema ([place]) 07/25/2018   mild  . 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 Chronic low back pain   . Colon polyps   . Depression   . Diabetes mellitus without complication ([place])   . Emphysema of lung ([place])   . Fatty liver   . Hypertension   . Melanoma ([place])    R upper extremity  . Melanoma in situ of right upper extremity ([place])   . Mild diastolic dysfunction 01/08/4173  . Osteoarthritis of lumbar spine   . Osteoporosis   . Renal cancer ([place])   . Skin cancer   . Sleep apnea 2008   cpap nightly  . Sphincter of Oddi dysfunction    bile duct    Family History  Problem Relation Age of Onset  . Depression Mother   . Hyperlipidemia Mother   . Hypertension Mother   . Colon  polyps Mother   . Heart attack Father   . Hyperlipidemia Father   . Hypertension Father   . Colon polyps Father   . Alcohol abuse Sister   . Depression Sister   . Alcohol abuse Son   . Alcohol abuse Son   . Alcohol abuse Sister   . Depression Sister   . Stomach cancer Brother   . Colon cancer Neg Hx   . Esophageal cancer Neg Hx   . Rectal cancer Neg Hx     Past Surgical History:  Procedure Laterality Date  . ABDOMINAL HYSTERECTOMY    . ANKLE SURGERY Left    in her 31s  . BILE DUCT STENT PLACEMENT    . CARDIAC CATHETERIZATION    . CHOLECYSTECTOMY    . COLONOSCOPY    . LEFT HEART CATH AND CORONARY ANGIOGRAPHY N/A 09/05/2019   Procedure: LEFT HEART CATH AND CORONARY ANGIOGRAPHY;  Surgeon: Leonie Man, MD;  Location: [facility];  Service: Cardiovascular;  Laterality: N/A;  . LUMBAR FUSION     7 level   . MOHS SURGERY     x 11    . PARTIAL NEPHRECTOMY    . POLYPECTOMY    . TOTAL HIP ARTHROPLASTY Left 03/01/2020   Procedure: LEFT TOTAL HIP ARTHROPLASTY ANTERIOR APPROACH;  Surgeon: Mcarthur Rossetti, MD;  Location: [facility];  Service: Orthopedics;  Laterality: Left;  . UPPER GASTROINTESTINAL ENDOSCOPY     Social History   Occupational History  . Not on file  Tobacco Use  . Smoking status: Former Smoker    Packs/day: 1.00    Years: 40.00    Pack years: 40.00    Types: Cigarettes    Quit date: 12/21/2010    Years since quitting: 9.8  . Smokeless tobacco: Never Used  Vaping Use  . Vaping Use: Former  . Devices: 2012-2014 while quitting cigaretter  Substance and Sexual Activity  . Alcohol use: Yes    Alcohol/week: 1.0 - 2.0 standard drink    Types: 1 - 2 Standard drinks or equivalent per week  . Drug use: No  . Sexual activity: Yes    Birth control/protection: Surgical, None

## 2020-10-15 DIAGNOSIS — Z23 Encounter for immunization: Secondary | ICD-10-CM | POA: Diagnosis not present

## 2020-10-18 ENCOUNTER — Ambulatory Visit: Payer: BC Managed Care – PPO | Admitting: Physical Therapy

## 2020-10-25 ENCOUNTER — Ambulatory Visit: Payer: BC Managed Care – PPO | Admitting: Physical Therapy

## 2020-10-28 DIAGNOSIS — C642 Malignant neoplasm of left kidney, except renal pelvis: Secondary | ICD-10-CM | POA: Diagnosis not present

## 2020-10-28 DIAGNOSIS — C44311 Basal cell carcinoma of skin of nose: Secondary | ICD-10-CM | POA: Diagnosis not present

## 2020-10-28 DIAGNOSIS — L820 Inflamed seborrheic keratosis: Secondary | ICD-10-CM | POA: Diagnosis not present

## 2020-10-28 DIAGNOSIS — L859 Epidermal thickening, unspecified: Secondary | ICD-10-CM | POA: Diagnosis not present

## 2020-10-28 DIAGNOSIS — D225 Melanocytic nevi of trunk: Secondary | ICD-10-CM | POA: Diagnosis not present

## 2020-10-28 DIAGNOSIS — D485 Neoplasm of uncertain behavior of skin: Secondary | ICD-10-CM | POA: Diagnosis not present

## 2020-10-28 DIAGNOSIS — D1801 Hemangioma of skin and subcutaneous tissue: Secondary | ICD-10-CM | POA: Diagnosis not present

## 2020-10-30 DIAGNOSIS — C44311 Basal cell carcinoma of skin of nose: Secondary | ICD-10-CM | POA: Diagnosis not present

## 2020-10-31 ENCOUNTER — Ambulatory Visit (INDEPENDENT_AMBULATORY_CARE_PROVIDER_SITE_OTHER): Payer: BC Managed Care – PPO | Admitting: Osteopathic Medicine

## 2020-10-31 ENCOUNTER — Encounter: Payer: Self-pay | Admitting: Osteopathic Medicine

## 2020-10-31 ENCOUNTER — Other Ambulatory Visit: Payer: Self-pay

## 2020-10-31 VITALS — BP 135/82 | HR 93 | Temp 97.9°F | Wt 299.0 lb

## 2020-10-31 DIAGNOSIS — E1165 Type 2 diabetes mellitus with hyperglycemia: Secondary | ICD-10-CM | POA: Diagnosis not present

## 2020-10-31 DIAGNOSIS — Z0000 Encounter for general adult medical examination without abnormal findings: Secondary | ICD-10-CM

## 2020-10-31 DIAGNOSIS — D649 Anemia, unspecified: Secondary | ICD-10-CM

## 2020-10-31 DIAGNOSIS — I10 Essential (primary) hypertension: Secondary | ICD-10-CM | POA: Diagnosis not present

## 2020-10-31 LAB — COMPLETE METABOLIC PANEL WITH GFR
AG Ratio: 1.5 (calc) (ref 1.0–2.5)
ALT: 24 U/L (ref 6–29)
AST: 19 U/L (ref 10–35)
Albumin: 4 g/dL (ref 3.6–5.1)
Alkaline phosphatase (APISO): 102 U/L (ref 37–153)
BUN/Creatinine Ratio: 15 (calc) (ref 6–22)
BUN: 16 mg/dL (ref 7–25)
CO2: 28 mmol/L (ref 20–32)
Calcium: 9.7 mg/dL (ref 8.6–10.4)
Chloride: 106 mmol/L (ref 98–110)
Creat: 1.06 mg/dL — ABNORMAL HIGH (ref 0.50–0.99)
GFR, Est African American: 62 mL/min/{1.73_m2} (ref >=60)
GFR, Est Non African American: 54 mL/min/{1.73_m2} — ABNORMAL LOW (ref >=60)
Globulin: 2.6 g/dL (calc) (ref 1.9–3.7)
Glucose, Bld: 95 mg/dL (ref 65–99)
Potassium: 4.7 mmol/L (ref 3.5–5.3)
Sodium: 141 mmol/L (ref 135–146)
Total Bilirubin: 0.6 mg/dL (ref 0.2–1.2)
Total Protein: 6.6 g/dL (ref 6.1–8.1)

## 2020-10-31 LAB — CBC
HCT: 34.4 % — ABNORMAL LOW (ref 35.0–45.0)
Hemoglobin: 11.4 g/dL — ABNORMAL LOW (ref 11.7–15.5)
MCH: 29.3 pg (ref 27.0–33.0)
MCHC: 33.1 g/dL (ref 32.0–36.0)
MCV: 88.4 fL (ref 80.0–100.0)
MPV: 9.9 fL (ref 7.5–12.5)
Platelets: 333 10*3/uL (ref 140–400)
RBC: 3.89 10*6/uL (ref 3.80–5.10)
RDW: 12.9 % (ref 11.0–15.0)
WBC: 6 10*3/uL (ref 3.8–10.8)

## 2020-10-31 LAB — HEMOGLOBIN A1C
Hgb A1c MFr Bld: 6.1 % of total Hgb — ABNORMAL HIGH (ref <5.7)
Mean Plasma Glucose: 128 (calc)
eAG (mmol/L): 7.1 (calc)

## 2020-10-31 LAB — LIPID PANEL W/REFLEX DIRECT LDL
Cholesterol: 165 mg/dL (ref <200)
HDL: 52 mg/dL (ref >=50)
LDL Cholesterol (Calc): 90 mg/dL (calc)
Non-HDL Cholesterol (Calc): 113 mg/dL (calc) (ref <130)
Total CHOL/HDL Ratio: 3.2 (calc) (ref <5.0)
Triglycerides: 136 mg/dL (ref <150)

## 2020-10-31 MED ORDER — BUPROPION HCL ER (XL) 150 MG PO TB24: 150.0000 mg | ORAL_TABLET | ORAL | 0 refills | Status: DC

## 2020-10-31 NOTE — Patient Instructions (Signed)
General Preventive Care  Most recent routine screening labs: see attached.   Blood pressure goal 130/80 or less.   Tobacco: don't!   Alcohol: responsible moderation is ok for most adults - if you have concerns about your alcohol intake, please talk to me!   Exercise: as tolerated to reduce risk of cardiovascular disease and diabetes. Strength training will also prevent osteoporosis.   Mental health: if need for mental health care (medicines, counseling, other), or concerns about moods, please let me know!   Sexual / Reproductive health: if need for STD testing, or if concerns with libido/pain problems, please let me know! If you need to discuss family planning, please let me know!   Advanced Directive: Living Will and/or Healthcare Power of Attorney recommended for all adults, regardless of age or health.  Vaccines  Flu vaccine: for almost everyone, every fall.   Shingles vaccine: after age 61 - done.   Pneumonia vaccines: after age 69 - done.  Tetanus booster: every 10 years - due 2022  COVID vaccine: THANKS for getting your vaccine! :)  Cancer screenings   Colon cancer screening: for everyone age 15-75. Colonoscopy due 11/2022  Breast cancer screening: mammogram due 03/2021  Cervical cancer screening: Pap not needed w/ hysterectomy.   Lung cancer screening: follow up as directed by lung doctor  Infection screenings  . HIV: recommended screening at least once age 68-65, more often as needed. . Gonorrhea/Chlamydia: screening as needed, though many insurances require testing for anyone on birth control pills. . Hepatitis C: recommended once for everyone age 87-75 . TB: certain at-risk populations, or depending on work requirements and/or travel history Other . Bone Density Test: recommended for women at age 42, men at age 95, sooner depending on risk factors . Abdominal Aortic Aneurysm: screening with ultrasound recommended once for men age 82-75 who have ever smoked

## 2020-10-31 NOTE — Progress Notes (Signed)
Bonnie [name] is a 69 y.o. female who presents to  [place] at [facility]  today, 10/31/20, seeking care for the following:  . Annual physical - see below for review of preventive care  . Mental health - under significant stress lately d/t family illness (injury w/ one son, addition issues w/ other son, husband recent dx rectal cancer) and social isolation d/t pandemic and moving. Furstrated w/ stress eating.  . Labs reviewed, anemia w/ hgb trending down      ASSESSMENT & PLAN with other pertinent findings:  The primary encounter diagnosis was Annual physical exam. Diagnoses of Normocytic anemia - hx iron deficiency, Essential hypertension, and Type 2 diabetes mellitus with hyperglycemia, without long-term current use of insulin ([place]) were also pertinent to this visit.   No results found for this or any previous visit (from the past 24 hour(s)).  --> switch to XR Wellbutrin, hopefully this will help w/ stress eating and mood, close f/u mental health     Patient Instructions  General Preventive Care  Most recent routine screening labs: see attached.   Blood pressure goal 130/80 or less.   Tobacco: don't!   Alcohol: responsible moderation is ok for most adults - if you have concerns about your alcohol intake, please talk to me!   Exercise: as tolerated to reduce risk of cardiovascular disease and diabetes. Strength training will also prevent osteoporosis.   Mental health: if need for mental health care (medicines, counseling, other), or concerns about moods, please let me know!   Sexual / Reproductive health: if need for STD testing, or if concerns with libido/pain problems, please let me know! If you need to discuss family planning, please let me know!   Advanced Directive: Living Will and/or Healthcare Power of Attorney recommended for all adults, regardless of age or health.  Vaccines  Flu vaccine: for almost everyone, every fall.    Shingles vaccine: after age 39 - done.   Pneumonia vaccines: after age 31 - done.  Tetanus booster: every 10 years - due 2022  COVID vaccine: THANKS for getting your vaccine! :)  Cancer screenings   Colon cancer screening: for everyone age 81-75. Colonoscopy due 11/2022  Breast cancer screening: mammogram due 03/2021  Cervical cancer screening: Pap not needed w/ hysterectomy.   Lung cancer screening: follow up as directed by lung doctor  Infection screenings  . HIV: recommended screening at least once age 76-65, more often as needed. . Gonorrhea/Chlamydia: screening as needed, though many insurances require testing for anyone on birth control pills. . Hepatitis C: recommended once for everyone age 35-75 . TB: certain at-risk populations, or depending on work requirements and/or travel history Other . Bone Density Test: recommended for women at age 3, men at age 59, sooner depending on risk factors . Abdominal Aortic Aneurysm: screening with ultrasound recommended once for men age 62-75 who have ever smoked    Orders Placed This Encounter  Procedures  . CBC (INCLUDES DIFF/PLT) WITH PATHOLOGIST REVIEW  . Fe+TIBC+Fer  . Reticulocytes    Meds ordered this encounter  Medications  . buPROPion (WELLBUTRIN XL) 150 MG 24 hr tablet    Sig: Take 1 tablet (150 mg total) by mouth every morning.    Dispense:  90 tablet    Refill:  0       Follow-up instructions: Return in about 2 weeks (around 11/14/2020) for LAB VISIT RECHECK ANEMIA. VIRTUAL AFTER LABS TO GO OVER RESULTS AND TO RECHECK MENTAL  HEALTH .                                         BP 135/82 (BP Location: Left Arm, Patient Position: Sitting, Cuff Size: Large)   Pulse 93   Temp 97.9 F (36.6 C) (Oral)   Wt 299 lb 0.6 oz (135.6 kg)   BMI 44.16 kg/m   Current Meds  Medication Sig  . ALPRAZolam (XANAX) 0.25 MG tablet Take 1-2 tablets (0.25-0.5 mg total) by mouth 3 (three)  times daily as needed for anxiety or sleep.  Marland Kitchen aspirin 81 MG chewable tablet Chew by mouth daily.  Marland Kitchen atorvastatin (LIPITOR) 40 MG tablet Take 1 tablet (40 mg total) by mouth daily.  . blood glucose meter kit and supplies KIT Check morning fasting blood glucose and up to four times daily as directed.  . Cholecalciferol (VITAMIN D3) 50 MCG (2000 UT) TABS Take 2,000 Units by mouth daily.  . enalapril (VASOTEC) 10 MG tablet Take 1 tablet (10 mg total) by mouth daily.  . fluticasone (FLONASE) 50 MCG/ACT nasal spray Place 2 sprays into both nostrils daily as needed for allergies or rhinitis.   Marland Kitchen HYDROcodone-acetaminophen (NORCO/VICODIN) 5-325 MG tablet Take 1 tablet by mouth every 8 (eight) hours as needed for moderate pain or severe pain.  . Multiple Vitamin tablet Take 1 tablet by mouth daily.   . Omega-3 Fatty Acids (FISH OIL) 1000 MG CAPS Take 1,000 mg by mouth daily.   . ONE TOUCH ULTRA TEST test strip   . ONETOUCH DELICA LANCETS 50P MISC   . Semaglutide (RYBELSUS) 7 MG TABS Take 1 tablet by mouth daily.  . traZODone (DESYREL) 50 MG tablet Take 1 tablet (50 mg total) by mouth at bedtime as needed.  . triamterene-hydrochlorothiazide (MAXZIDE-25) 37.5-25 MG tablet TAKE 1 TABLET DAILY  . venlafaxine XR (EFFEXOR-XR) 75 MG 24 hr capsule TAKE 2 CAPSULES DAILY WITH BREAKFAST  . [DISCONTINUED] buPROPion (WELLBUTRIN SR) 150 MG 12 hr tablet TAKE 1 TABLET EVERY MORNING    Results for orders placed or performed in visit on 10/03/20 (from the past 72 hour(s))  COMPLETE METABOLIC PANEL WITH GFR     Status: Abnormal   Collection Time: 10/30/20 12:00 AM  Result Value Ref Range   Glucose, Bld 95 65 - 99 mg/dL    Comment: .            Fasting reference interval .    BUN 16 7 - 25 mg/dL   Creat 1.06 (H) 0.50 - 0.99 mg/dL    Comment: For patients >83 years of age, the reference limit for Creatinine is approximately 13% higher for people identified as African-American. .    GFR, Est Non African American  54 (L) > OR = 60 mL/min/1.34m   GFR, Est African American 62 > OR = 60 mL/min/1.748m  BUN/Creatinine Ratio 15 6 - 22 (calc)   Sodium 141 135 - 146 mmol/L   Potassium 4.7 3.5 - 5.3 mmol/L   Chloride 106 98 - 110 mmol/L   CO2 28 20 - 32 mmol/L   Calcium 9.7 8.6 - 10.4 mg/dL   Total Protein 6.6 6.1 - 8.1 g/dL   Albumin 4.0 3.6 - 5.1 g/dL   Globulin 2.6 1.9 - 3.7 g/dL (calc)   AG Ratio 1.5 1.0 - 2.5 (calc)   Total Bilirubin 0.6 0.2 - 1.2 mg/dL   Alkaline phosphatase (  APISO) 102 37 - 153 U/L   AST 19 10 - 35 U/L   ALT 24 6 - 29 U/L  Lipid Panel w/reflex Direct LDL     Status: None   Collection Time: 10/30/20 12:00 AM  Result Value Ref Range   Cholesterol 165 <200 mg/dL   HDL 52 > OR = 50 mg/dL   Triglycerides 136 <150 mg/dL   LDL Cholesterol (Calc) 90 mg/dL (calc)    Comment: Reference range: <100 . Desirable range <100 mg/dL for primary prevention;   <70 mg/dL for patients with CHD or diabetic patients  with > or = 2 CHD risk factors. Marland Kitchen LDL-C is now calculated using the Martin-Hopkins  calculation, which is a validated novel method providing  better accuracy than the Friedewald equation in the  estimation of LDL-C.  Cresenciano Genre et al. Annamaria Helling. 8478;412(82): 2061-2068  (http://education.[employer].com/faq/FAQ164)    Total CHOL/HDL Ratio 3.2 <5.0 (calc)   Non-HDL Cholesterol (Calc) 113 <130 mg/dL (calc)    Comment: For patients with diabetes plus 1 major ASCVD risk  factor, treating to a non-HDL-C goal of <100 mg/dL  (LDL-C of <70 mg/dL) is considered a therapeutic  option.   CBC     Status: Abnormal   Collection Time: 10/30/20 12:00 AM  Result Value Ref Range   WBC 6.0 3.8 - 10.8 Thousand/uL   RBC 3.89 3.80 - 5.10 Million/uL   Hemoglobin 11.4 (L) 11.7 - 15.5 g/dL   HCT 34.4 (L) 35 - 45 %   MCV 88.4 80.0 - 100.0 fL   MCH 29.3 27.0 - 33.0 pg   MCHC 33.1 32.0 - 36.0 g/dL   RDW 12.9 11.0 - 15.0 %   Platelets 333 140 - 400 Thousand/uL   MPV 9.9 7.5 - 12.5 fL   Hemoglobin A1c     Status: Abnormal   Collection Time: 10/30/20 12:00 AM  Result Value Ref Range   Hgb A1c MFr Bld 6.1 (H) <5.7 % of total Hgb    Comment: For someone without known diabetes, a hemoglobin  A1c value between 5.7% and 6.4% is consistent with prediabetes and should be confirmed with a  follow-up test. . For someone with known diabetes, a value <7% indicates that their diabetes is well controlled. A1c targets should be individualized based on duration of diabetes, age, comorbid conditions, and other considerations. . This assay result is consistent with an increased risk of diabetes. . Currently, no consensus exists regarding use of hemoglobin A1c for diagnosis of diabetes for children. .    Mean Plasma Glucose 128 (calc)   eAG (mmol/L) 7.1 (calc)    No results found.     All questions at time of visit were answered - patient instructed to contact office with any additional concerns or updates.  ER/RTC precautions were reviewed with the patient as applicable.   Please note: voice recognition software was used to produce this document, and typos may escape review. Please contact Dr. Sheppard Coil for any needed clarifications.

## 2020-11-01 DIAGNOSIS — D1779 Benign lipomatous neoplasm of other sites: Secondary | ICD-10-CM | POA: Diagnosis not present

## 2020-11-01 DIAGNOSIS — D171 Benign lipomatous neoplasm of skin and subcutaneous tissue of trunk: Secondary | ICD-10-CM | POA: Diagnosis not present

## 2020-11-01 DIAGNOSIS — C642 Malignant neoplasm of left kidney, except renal pelvis: Secondary | ICD-10-CM | POA: Diagnosis not present

## 2020-11-01 DIAGNOSIS — N281 Cyst of kidney, acquired: Secondary | ICD-10-CM | POA: Diagnosis not present

## 2020-11-06 ENCOUNTER — Other Ambulatory Visit: Payer: Self-pay

## 2020-11-06 DIAGNOSIS — F419 Anxiety disorder, unspecified: Secondary | ICD-10-CM

## 2020-11-06 DIAGNOSIS — F5105 Insomnia due to other mental disorder: Secondary | ICD-10-CM

## 2020-11-06 MED ORDER — ALPRAZOLAM 0.25 MG PO TABS: 0.2500 mg | ORAL_TABLET | Freq: Three times a day (TID) | ORAL | 0 refills | Status: DC | PRN

## 2020-11-06 NOTE — Telephone Encounter (Signed)
Pt called requesting med refill for alprazolam. Per pt, having a real issues with her husband, anxiety and sleep issues. Rx pended.

## 2020-11-11 NOTE — Telephone Encounter (Signed)
Task completed. Pt notified by pharmacy of rx refill.

## 2020-11-19 ENCOUNTER — Telehealth: Payer: Self-pay

## 2020-11-19 NOTE — Telephone Encounter (Signed)
Pt called with concerns of possible internal bleeding. Per pt, she noticed blood on her stool over the weekend. She still continues to have a persistent dry cough. She mentioned that her sputum had some blood too. She completed her lab order earlier today for iron defiency. She's worried about the situation. Her daughter came in for a surprise visit and her husband recently was diagnosed with colon cancer, so she is trying to avoid worrying her daughter. Pls advise, thanks.

## 2020-11-19 NOTE — Telephone Encounter (Signed)
Couldn't say without a visit - if profuse bleeding, lightheadedness, coughing up gobs of blood or anything that looks like coffee grounds (slight streaks in mucus is not too concerning but clots are not good).

## 2020-11-20 DIAGNOSIS — C44311 Basal cell carcinoma of skin of nose: Secondary | ICD-10-CM | POA: Diagnosis not present

## 2020-11-20 LAB — IRON,TIBC AND FERRITIN PANEL
%SAT: 19 % (calc) (ref 16–45)
Ferritin: 45 ng/mL (ref 16–288)
Iron: 77 ug/dL (ref 45–160)
TIBC: 408 mcg/dL (calc) (ref 250–450)

## 2020-11-20 LAB — CBC (INCLUDES DIFF/PLT) WITH PATHOLOGIST REVIEW
Absolute Monocytes: 523 cells/uL (ref 200–950)
Basophils Absolute: 50 cells/uL (ref 0–200)
Basophils Relative: 0.8 %
Eosinophils Absolute: 151 cells/uL (ref 15–500)
Eosinophils Relative: 2.4 %
HCT: 37.2 % (ref 35.0–45.0)
Hemoglobin: 12.1 g/dL (ref 11.7–15.5)
Lymphs Abs: 1632 cells/uL (ref 850–3900)
MCH: 28.5 pg (ref 27.0–33.0)
MCHC: 32.5 g/dL (ref 32.0–36.0)
MCV: 87.5 fL (ref 80.0–100.0)
MPV: 9.9 fL (ref 7.5–12.5)
Monocytes Relative: 8.3 %
Neutro Abs: 3944 cells/uL (ref 1500–7800)
Neutrophils Relative %: 62.6 %
Platelets: 360 10*3/uL (ref 140–400)
RBC: 4.25 10*6/uL (ref 3.80–5.10)
RDW: 12.9 % (ref 11.0–15.0)
Total Lymphocyte: 25.9 %
WBC: 6.3 10*3/uL (ref 3.8–10.8)

## 2020-11-20 LAB — RETICULOCYTES
ABS Retic: 59500 cells/uL (ref 20000–8000)
Retic Ct Pct: 1.4 %

## 2020-11-21 ENCOUNTER — Other Ambulatory Visit: Payer: Self-pay

## 2020-11-21 DIAGNOSIS — M1612 Unilateral primary osteoarthritis, left hip: Secondary | ICD-10-CM

## 2020-11-21 MED ORDER — HYDROCODONE-ACETAMINOPHEN 5-325 MG PO TABS: 1.0000 | ORAL_TABLET | Freq: Three times a day (TID) | ORAL | 0 refills | Status: DC | PRN

## 2020-11-21 NOTE — Telephone Encounter (Signed)
Patient states she is feeling better. She will reach out to GI.

## 2020-11-22 ENCOUNTER — Encounter: Payer: Self-pay | Admitting: Osteopathic Medicine

## 2020-11-25 ENCOUNTER — Other Ambulatory Visit: Payer: Self-pay

## 2020-11-25 DIAGNOSIS — M1612 Unilateral primary osteoarthritis, left hip: Secondary | ICD-10-CM

## 2020-11-25 MED ORDER — HYDROCODONE-ACETAMINOPHEN 5-325 MG PO TABS: 1.0000 | ORAL_TABLET | Freq: Three times a day (TID) | ORAL | 0 refills | Status: DC | PRN

## 2020-11-25 NOTE — Telephone Encounter (Signed)
Bonnie [name] needs a refill on Hydrocodone. She states she did not receive the printed prescription. I checked up front and it isn't in the file. Please send to the pharmacy.

## 2020-12-03 ENCOUNTER — Encounter: Payer: Self-pay | Admitting: Physician Assistant

## 2020-12-03 ENCOUNTER — Ambulatory Visit (INDEPENDENT_AMBULATORY_CARE_PROVIDER_SITE_OTHER): Payer: BC Managed Care – PPO | Admitting: Physician Assistant

## 2020-12-03 VITALS — BP 140/88 | HR 94 | Ht 69.0 in | Wt 300.0 lb

## 2020-12-03 DIAGNOSIS — K640 First degree hemorrhoids: Secondary | ICD-10-CM

## 2020-12-03 DIAGNOSIS — K219 Gastro-esophageal reflux disease without esophagitis: Secondary | ICD-10-CM | POA: Diagnosis not present

## 2020-12-03 DIAGNOSIS — K625 Hemorrhage of anus and rectum: Secondary | ICD-10-CM

## 2020-12-03 MED ORDER — FAMOTIDINE 20 MG PO TABS: ORAL_TABLET | ORAL | Status: DC

## 2020-12-03 NOTE — Progress Notes (Signed)
Chief Complaint: Bright red blood per rectum  HPI:    Bonnie [name] is a 69 year old Caucasian female with a past medical history as listed below, known to Dr. Havery Moros, who presents to clinic today with a complaint of bright red blood per rectum.    12/08/2019 colonoscopy with a single colonic angiodysplastic lesion, 2 polyps in the cecum, 4 polyps in the transverse colon and diverticulosis in the descending colon as well as internal hemorrhoids.  Pathology showed adenomatous polyps.  Repeat recommended in 3 years.    11/19/2020 CBC with a normal hemoglobin.  Iron studies normal.    Today, the patient explains that she had one episode of some bright red blood mixed in with her stool as well as in the toilet on 11/25.  Explains that she has had none since then.  Denies any rectal or abdominal pain at that time.  Tells me her bowel movements have been normal since then.    Also describes that she went to see her dentist recently who told her that she may have reflux and that she just should discuss this with her physician.  Tells me that she does have a small amount of heartburn but does not ever taste food in her mouth or acid.    Patient is under a lot of familial stress at the moment with various health aspects in her family.  Tells me she has been carb eating like a "mother bear".    Denies fever, chills, weight loss or change in bowel habits.  Past Medical History:  Diagnosis Date  . Abnormal mammogram of right breast 01/20/2018  . Anxiety   . Benign cyst of right breast 01/26/2018  . Blood transfusion without reported diagnosis   . Cataract   . Centrilobular emphysema ([place]) 07/25/2018   mild  . 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 Chronic low back pain   . Colon polyps   . Depression   . Diabetes mellitus without complication ([place])   . Emphysema of lung ([place])   . Fatty liver   . Hypertension   . Melanoma ([place])    R upper extremity  . Melanoma in situ of  right upper extremity ([place])   . Mild diastolic dysfunction 01/12/4824  . Osteoarthritis of lumbar spine   . Osteoporosis   . Renal cancer ([place])   . Skin cancer   . Sleep apnea 2008   cpap nightly  . Sphincter of Oddi dysfunction    bile duct    Past Surgical History:  Procedure Laterality Date  . ABDOMINAL HYSTERECTOMY    . ANKLE SURGERY Left    in her 43s  . BILE DUCT STENT PLACEMENT    . CARDIAC CATHETERIZATION    . CHOLECYSTECTOMY    . COLONOSCOPY    . LEFT HEART CATH AND CORONARY ANGIOGRAPHY N/A 09/05/2019   Procedure: LEFT HEART CATH AND CORONARY ANGIOGRAPHY;  Surgeon: Leonie Man, MD;  Location: [facility];  Service: Cardiovascular;  Laterality: N/A;  . LUMBAR FUSION     7 level   . MOHS SURGERY     x 11  . PARTIAL NEPHRECTOMY    . POLYPECTOMY    . TOTAL HIP ARTHROPLASTY Left 03/01/2020   Procedure: LEFT TOTAL HIP ARTHROPLASTY ANTERIOR APPROACH;  Surgeon: Mcarthur Rossetti, MD;  Location: [facility];  Service: Orthopedics;  Laterality: Left;  . UPPER GASTROINTESTINAL ENDOSCOPY      Current Outpatient Medications  Medication  Sig Dispense Refill  . allopurinol (ZYLOPRIM) 100 MG tablet TAKE 1 TABLET DAILY (Patient not taking: Reported on 09/10/2020) 90 tablet 0  . ALPRAZolam (XANAX) 0.25 MG tablet Take 1-2 tablets (0.25-0.5 mg total) by mouth 3 (three) times daily as needed for anxiety or sleep. 30 tablet 0  . aspirin 81 MG chewable tablet Chew by mouth daily.    Marland Kitchen atorvastatin (LIPITOR) 40 MG tablet Take 1 tablet (40 mg total) by mouth daily. 90 tablet 3  . blood glucose meter kit and supplies KIT Check morning fasting blood glucose and up to four times daily as directed. 1 each 0  . buPROPion (WELLBUTRIN XL) 150 MG 24 hr tablet Take 1 tablet (150 mg total) by mouth every morning. 90 tablet 0  . Cholecalciferol (VITAMIN D3) 50 MCG (2000 UT) TABS Take 2,000 Units by mouth daily. 90 tablet 3  . enalapril (VASOTEC) 10 MG tablet Take 1 tablet (10 mg total) by  mouth daily. 90 tablet 3  . fluticasone (FLONASE) 50 MCG/ACT nasal spray Place 2 sprays into both nostrils daily as needed for allergies or rhinitis.     Marland Kitchen HYDROcodone-acetaminophen (NORCO/VICODIN) 5-325 MG tablet Take 1 tablet by mouth every 8 (eight) hours as needed for moderate pain or severe pain. 30 tablet 0  . Multiple Vitamin tablet Take 1 tablet by mouth daily.     . Omega-3 Fatty Acids (FISH OIL) 1000 MG CAPS Take 1,000 mg by mouth daily.     . ONE TOUCH ULTRA TEST test strip     . ONETOUCH DELICA LANCETS 33G MISC     . Semaglutide (RYBELSUS) 7 MG TABS Take 1 tablet by mouth daily. 90 tablet 1  . traZODone (DESYREL) 50 MG tablet Take 1 tablet (50 mg total) by mouth at bedtime as needed. 90 tablet 3  . triamterene-hydrochlorothiazide (MAXZIDE-25) 37.5-25 MG tablet TAKE 1 TABLET DAILY 90 tablet 1  . venlafaxine XR (EFFEXOR-XR) 75 MG 24 hr capsule TAKE 2 CAPSULES DAILY WITH BREAKFAST 180 capsule 0   No current facility-administered medications for this visit.    Allergies as of 12/03/2020 - Review Complete 10/31/2020  Allergen Reaction Noted  . Latex Itching and Rash 05/19/2017  . Metformin and related Other (See Comments) 07/06/2018  . Morphine and related Rash and Other (See Comments) 10/06/2018  . Tape Swelling and Other (See Comments) 05/19/2017  . Tramadol Palpitations 08/29/2015    Family History  Problem Relation Age of Onset  . Depression Mother   . Hyperlipidemia Mother   . Hypertension Mother   . Colon polyps Mother   . Heart attack Father   . Hyperlipidemia Father   . Hypertension Father   . Colon polyps Father   . Alcohol abuse Sister   . Depression Sister   . Alcohol abuse Son   . Alcohol abuse Son   . Alcohol abuse Sister   . Depression Sister   . Stomach cancer Brother   . Colon cancer Neg Hx   . Esophageal cancer Neg Hx   . Rectal cancer Neg Hx     Social History   Socioeconomic History  . Marital status: Married    Spouse name: Not on file  .  Number of children: Not on file  . Years of education: Not on file  . Highest education level: Not on file  Occupational History  . Not on file  Tobacco Use  . Smoking status: Former Smoker    Packs/day: 1.00    Years: 40.00  Pack years: 40.00    Types: Cigarettes    Quit date: 12/21/2010    Years since quitting: 9.9  . Smokeless tobacco: Never Used  Vaping Use  . Vaping Use: Former  . Devices: 2012-2014 while quitting cigaretter  Substance and Sexual Activity  . Alcohol use: Yes    Alcohol/week: 1.0 - 2.0 standard drink    Types: 1 - 2 Standard drinks or equivalent per week  . Drug use: No  . Sexual activity: Yes    Birth control/protection: Surgical, None  Other Topics Concern  . Not on file  Social History Narrative  . Not on file   Social Determinants of Health   Financial Resource Strain: Not on file  Food Insecurity: Not on file  Transportation Needs: Not on file  Physical Activity: Not on file  Stress: Not on file  Social Connections: Not on file  Intimate Partner Violence: Not on file    Review of Systems:    Constitutional: No weight loss, fever or chills Cardiovascular: No chest pain Respiratory: No SOB  Gastrointestinal: See HPI and otherwise negative   Physical Exam:  Vital signs: BP 140/88   Pulse 94   Ht _0  (1.753 m)   Wt 300 lb (136.1 kg)   BMI 44.30 kg/m   Constitutional:   Pleasant overweight Caucasian female appears to be in NAD, Well developed, Well nourished, alert and cooperative Respiratory: Respirations even and unlabored. Lungs clear to auscultation bilaterally.   No wheezes, crackles, or rhonchi.  Cardiovascular: Normal S1, S2. No MRG. Regular rate and rhythm. No peripheral edema, cyanosis or pallor.  Gastrointestinal:  Soft, nondistended, nontender. No rebound or guarding. Normal bowel sounds. No appreciable masses or hepatomegaly. Rectal: External: Posterior hemorrhoid tag, no fissure, no tenderness; internal: No mass, no  discharge; anoscopy: Grade 1 hemorrhoids and otherwise normal Psychiatric: Demonstrates good judgement and reason without abnormal affect or behaviors.  RELEVANT LABS AND IMAGING: CBC    Component Value Date/Time   WBC 6.3 11/19/2020 1053   RBC 4.25 11/19/2020 1053   HGB 12.1 11/19/2020 1053   HGB 12.6 08/21/2019 1216   HCT 37.2 11/19/2020 1053   HCT 37.4 08/21/2019 1216   PLT 360 11/19/2020 1053   PLT 345 08/21/2019 1216   MCV 87.5 11/19/2020 1053   MCV 90 08/21/2019 1216   MCH 28.5 11/19/2020 1053   MCHC 32.5 11/19/2020 1053   RDW 12.9 11/19/2020 1053   RDW 13.1 08/21/2019 1216   LYMPHSABS 1,632 11/19/2020 1053   MONOABS 0.5 10/27/2019 1328   EOSABS 151 11/19/2020 1053   BASOSABS 50 11/19/2020 1053    CMP     Component Value Date/Time   NA 141 10/30/2020 0000   NA 143 08/21/2019 1216   K 4.7 10/30/2020 0000   CL 106 10/30/2020 0000   CO2 28 10/30/2020 0000   GLUCOSE 95 10/30/2020 0000   BUN 16 10/30/2020 0000   BUN 22 08/21/2019 1216   CREATININE 1.06 (H) 10/30/2020 0000   CALCIUM 9.7 10/30/2020 0000   PROT 6.6 10/30/2020 0000   ALBUMIN 3.4 (L) 10/27/2019 1328   AST 19 10/30/2020 0000   ALT 24 10/30/2020 0000   ALKPHOS 93 10/27/2019 1328   BILITOT 0.6 10/30/2020 0000   GFRNONAA 54 (L) 10/30/2020 0000   GFRAA 62 10/30/2020 0000    Assessment: 1.  Rectal bleeding: One episode about 3 weeks ago, none since, internal hemorrhoids on exam today are likely the cause, colonoscopy a year ago with multiple  polyps, diverticuli and hemorrhoids 2.  GERD: Per her dentist, also some heartburn sensation; likely gastritis from recent stress/change in diet  Plan: 1.  Discussed with patient that due to her normal anoscopy today and normal colonoscopy a year ago I am not very concerned about her bleeding as it was just one instance.  Discussed this is likely from her hemorrhoids.  Told her that if she sees any more blood in the future would recommend that we try Hydrocortisone  suppositories twice daily x7 days.  If she continues with bleeding after that point then she may need a sigmoidoscopy. 2.  Encouraged the patient to start Pepcid 20 mg daily, either every morning or nightly.  Prescribed #30 with three refills for now.  If she continues with reflux symptoms beyond this then we could try increasing to twice daily or a PPI. 3.  Patient to follow in clinic with Dr. Havery Moros or myself as needed.  Bonnie Newer, PA-C [facility] 12/03/2020, 11:03 AM  Cc: Emeterio Reeve, DO

## 2020-12-03 NOTE — Patient Instructions (Signed)
If you are age 69 or older, your body mass index should be between 23-30. Your Body mass index is 44.3 kg/m. If this is out of the aforementioned range listed, please consider follow up with your Primary Care Provider.  Please restart Pepcid 20mg  one tablet every day or at bedtime.  Please let us know how you feel after taking medications.  Call if you have more bleeding.   You will follow up with our office on an as needed basis.  Thank you for entrusting me with your care and choosing [facility].  Ellouise Newer, PA-C

## 2020-12-03 NOTE — Progress Notes (Signed)
Agree with assessment and plan as outlined.  

## 2020-12-16 DIAGNOSIS — G4733 Obstructive sleep apnea (adult) (pediatric): Secondary | ICD-10-CM | POA: Diagnosis not present

## 2020-12-31 ENCOUNTER — Other Ambulatory Visit: Payer: Self-pay

## 2020-12-31 DIAGNOSIS — M1612 Unilateral primary osteoarthritis, left hip: Secondary | ICD-10-CM

## 2020-12-31 NOTE — Telephone Encounter (Signed)
Pt called requesting med refill for hydrocodone-ace. Rx pended.  

## 2021-01-01 ENCOUNTER — Telehealth: Payer: Self-pay

## 2021-01-01 MED ORDER — ONETOUCH DELICA LANCETS 33G MISC: 99 refills | Status: DC

## 2021-01-01 MED ORDER — GLUCOSE BLOOD VI STRP: ORAL_STRIP | 99 refills | Status: DC

## 2021-01-01 MED ORDER — HYDROCODONE-ACETAMINOPHEN 5-325 MG PO TABS: 1.0000 | ORAL_TABLET | Freq: Three times a day (TID) | ORAL | 0 refills | Status: DC | PRN

## 2021-01-01 NOTE — Telephone Encounter (Signed)
[facility]ills for one touch verio test strips and one touch [address]nc. Both rxs written by historical provider.

## 2021-01-07 ENCOUNTER — Telehealth: Payer: Self-pay

## 2021-01-07 MED ORDER — GLUCOSE BLOOD VI STRP
ORAL_STRIP | 99 refills | Status: DC
Start: 2021-01-07 — End: 2022-04-13

## 2021-01-07 NOTE — Telephone Encounter (Signed)
[facility] requesting a 90 day supply for One touch Verio test strips. New rx.

## 2021-01-22 ENCOUNTER — Other Ambulatory Visit: Payer: Self-pay | Admitting: Osteopathic Medicine

## 2021-01-22 DIAGNOSIS — F3342 Major depressive disorder, recurrent, in full remission: Secondary | ICD-10-CM

## 2021-01-22 MED ORDER — TRAZODONE HCL 50 MG PO TABS: 50.0000 mg | ORAL_TABLET | Freq: Every evening | ORAL | 1 refills | Status: DC | PRN

## 2021-02-25 ENCOUNTER — Other Ambulatory Visit: Payer: Self-pay

## 2021-02-25 DIAGNOSIS — M1612 Unilateral primary osteoarthritis, left hip: Secondary | ICD-10-CM

## 2021-02-25 DIAGNOSIS — M47816 Spondylosis without myelopathy or radiculopathy, lumbar region: Secondary | ICD-10-CM

## 2021-02-25 NOTE — Telephone Encounter (Signed)
Pt called requesting a med refill for hydrocodone-ace. Pt states that she wants the dx associated with her rx changed. Per pt, the rx was not given for hip pain, it was for her back. Rx pended.

## 2021-02-26 MED ORDER — HYDROCODONE-ACETAMINOPHEN 5-325 MG PO TABS: 1.0000 | ORAL_TABLET | Freq: Three times a day (TID) | ORAL | 0 refills | Status: DC | PRN

## 2021-02-28 ENCOUNTER — Other Ambulatory Visit: Payer: Self-pay

## 2021-02-28 DIAGNOSIS — M47816 Spondylosis without myelopathy or radiculopathy, lumbar region: Secondary | ICD-10-CM

## 2021-02-28 NOTE — Telephone Encounter (Signed)
Contacted the pt regarding alternative pharmacy information for hydrocodone ace rx. Per pt, rx can be sent to [facility] located in [place]. Previous rx sent on 02/26/21 cancelled at [facility]. Rx pended/pharmacy updated.

## 2021-03-03 MED ORDER — HYDROCODONE-ACETAMINOPHEN 5-325 MG PO TABS: 1.0000 | ORAL_TABLET | Freq: Three times a day (TID) | ORAL | 0 refills | Status: DC | PRN

## 2021-03-04 ENCOUNTER — Encounter: Payer: Self-pay | Admitting: Osteopathic Medicine

## 2021-03-19 ENCOUNTER — Other Ambulatory Visit: Payer: Self-pay

## 2021-03-19 MED ORDER — RYBELSUS 7 MG PO TABS: 1.0000 | ORAL_TABLET | Freq: Every day | ORAL | 1 refills | Status: DC

## 2021-03-28 ENCOUNTER — Other Ambulatory Visit: Payer: Self-pay

## 2021-03-28 DIAGNOSIS — F331 Major depressive disorder, recurrent, moderate: Secondary | ICD-10-CM

## 2021-03-28 DIAGNOSIS — I10 Essential (primary) hypertension: Secondary | ICD-10-CM

## 2021-03-28 MED ORDER — ENALAPRIL MALEATE 10 MG PO TABS: 10.0000 mg | ORAL_TABLET | Freq: Every day | ORAL | 1 refills | Status: DC

## 2021-03-28 MED ORDER — TRIAMTERENE-HCTZ 37.5-25 MG PO TABS: 1.0000 | ORAL_TABLET | Freq: Every day | ORAL | 1 refills | Status: DC

## 2021-03-28 MED ORDER — VENLAFAXINE HCL ER 75 MG PO CP24: ORAL_CAPSULE | ORAL | 1 refills | Status: DC

## 2021-04-14 DIAGNOSIS — G4733 Obstructive sleep apnea (adult) (pediatric): Secondary | ICD-10-CM | POA: Diagnosis not present

## 2021-04-21 ENCOUNTER — Other Ambulatory Visit: Payer: Self-pay

## 2021-04-21 DIAGNOSIS — M47816 Spondylosis without myelopathy or radiculopathy, lumbar region: Secondary | ICD-10-CM

## 2021-04-21 NOTE — Telephone Encounter (Signed)
Pt called requesting med refill for hydrocodone. [facility]. Rx pended.

## 2021-04-22 ENCOUNTER — Other Ambulatory Visit: Payer: Self-pay | Admitting: Osteopathic Medicine

## 2021-04-22 MED ORDER — HYDROCODONE-ACETAMINOPHEN 5-325 MG PO TABS: 1.0000 | ORAL_TABLET | Freq: Three times a day (TID) | ORAL | 0 refills | Status: DC | PRN

## 2021-04-28 DIAGNOSIS — L218 Other seborrheic dermatitis: Secondary | ICD-10-CM | POA: Diagnosis not present

## 2021-04-28 DIAGNOSIS — L570 Actinic keratosis: Secondary | ICD-10-CM | POA: Diagnosis not present

## 2021-04-28 DIAGNOSIS — L814 Other melanin hyperpigmentation: Secondary | ICD-10-CM | POA: Diagnosis not present

## 2021-04-28 DIAGNOSIS — D1801 Hemangioma of skin and subcutaneous tissue: Secondary | ICD-10-CM | POA: Diagnosis not present

## 2021-04-28 DIAGNOSIS — D225 Melanocytic nevi of trunk: Secondary | ICD-10-CM | POA: Diagnosis not present

## 2021-04-29 ENCOUNTER — Encounter: Payer: Self-pay | Admitting: Osteopathic Medicine

## 2021-04-29 ENCOUNTER — Other Ambulatory Visit: Payer: Self-pay

## 2021-04-29 ENCOUNTER — Ambulatory Visit: Payer: BC Managed Care – PPO | Admitting: Osteopathic Medicine

## 2021-04-29 ENCOUNTER — Other Ambulatory Visit: Payer: Self-pay | Admitting: Osteopathic Medicine

## 2021-04-29 VITALS — BP 129/65 | HR 92 | Temp 98.5°F | Wt 305.0 lb

## 2021-04-29 DIAGNOSIS — F3341 Major depressive disorder, recurrent, in partial remission: Secondary | ICD-10-CM

## 2021-04-29 DIAGNOSIS — F331 Major depressive disorder, recurrent, moderate: Secondary | ICD-10-CM

## 2021-04-29 DIAGNOSIS — Z1231 Encounter for screening mammogram for malignant neoplasm of breast: Secondary | ICD-10-CM

## 2021-04-29 DIAGNOSIS — M159 Polyosteoarthritis, unspecified: Secondary | ICD-10-CM

## 2021-04-29 DIAGNOSIS — M8949 Other hypertrophic osteoarthropathy, multiple sites: Secondary | ICD-10-CM

## 2021-04-29 DIAGNOSIS — I10 Essential (primary) hypertension: Secondary | ICD-10-CM | POA: Diagnosis not present

## 2021-04-29 DIAGNOSIS — E1169 Type 2 diabetes mellitus with other specified complication: Secondary | ICD-10-CM | POA: Diagnosis not present

## 2021-04-29 DIAGNOSIS — E785 Hyperlipidemia, unspecified: Secondary | ICD-10-CM

## 2021-04-29 DIAGNOSIS — E1165 Type 2 diabetes mellitus with hyperglycemia: Secondary | ICD-10-CM | POA: Diagnosis not present

## 2021-04-29 DIAGNOSIS — D509 Iron deficiency anemia, unspecified: Secondary | ICD-10-CM | POA: Diagnosis not present

## 2021-04-29 DIAGNOSIS — F5105 Insomnia due to other mental disorder: Secondary | ICD-10-CM

## 2021-04-29 DIAGNOSIS — F419 Anxiety disorder, unspecified: Secondary | ICD-10-CM

## 2021-04-29 MED ORDER — BUPROPION HCL ER (XL) 150 MG PO TB24: 150.0000 mg | ORAL_TABLET | Freq: Every day | ORAL | 3 refills | Status: DC

## 2021-04-29 MED ORDER — ALPRAZOLAM 0.25 MG PO TABS: 0.2500 mg | ORAL_TABLET | Freq: Three times a day (TID) | ORAL | 0 refills | Status: DC | PRN

## 2021-04-29 MED ORDER — VENLAFAXINE HCL ER 75 MG PO CP24: 75.0000 mg | ORAL_CAPSULE | Freq: Every day | ORAL | 3 refills | Status: DC

## 2021-04-29 MED ORDER — TETANUS-DIPHTH-ACELL PERTUSSIS 5-2.5-18.5 LF-MCG/0.5 IM SUSP: 0.5000 mL | Freq: Once | INTRAMUSCULAR | 0 refills | Status: AC

## 2021-04-29 MED ORDER — BUPROPION HCL ER (XL) 150 MG PO TB24: 300.0000 mg | ORAL_TABLET | Freq: Every day | ORAL | 3 refills | Status: DC

## 2021-04-29 MED ORDER — TETANUS-DIPHTH-ACELL PERTUSSIS 5-2.5-18.5 LF-MCG/0.5 IM SUSP: 0.5000 mL | Freq: Once | INTRAMUSCULAR | 0 refills | Status: DC

## 2021-04-29 NOTE — Progress Notes (Signed)
Bonnie [name] is a 70 y.o. female who presents to  [place] at [facility]  today, 04/29/21, seeking care for the following:   Maintain opiate Rx   Requests labs - see below   Will be traveling soon, requests anxiolytic for flight   Discussed weight loss strategies     ASSESSMENT & PLAN with other pertinent findings:  The primary encounter diagnosis was Type 2 diabetes mellitus with hyperglycemia, without long-term current use of insulin ([place]). Diagnoses of Iron deficiency anemia, unspecified iron deficiency anemia type, Essential hypertension, Dyslipidemia associated with type 2 diabetes mellitus ([place]), Recurrent major depressive disorder, in partial remission ([place]), Anxiety disorder, unspecified type, Insomnia secondary to anxiety, Primary osteoarthritis involving multiple joints, Moderate episode of recurrent major depressive disorder ([place]), and Morbid obesity ([place]) were also pertinent to this visit.   1. Type 2 diabetes mellitus with hyperglycemia, without long-term current use of insulin (HCC) A1C pending   2. Iron deficiency anemia, unspecified iron deficiency anemia type Levels pending  3. Essential hypertension BP Readings from Last 3 Encounters:  04/29/21 129/65  12/03/20 140/88  10/31/20 135/82  Controlled  4. Dyslipidemia associated with type 2 diabetes mellitus ([place]) Lipids pending  5. Recurrent major depressive disorder, in partial remission (HCC) Stable, increasing Wellbutrin to help with weight   6. Anxiety disorder, unspecified type Stable Xanax for situational anxiety  7. Insomnia secondary to anxiety Continue trazodone  8. Primary osteoarthritis involving multiple joints Opiates manage pain and improve functionality   9. Moderate episode of recurrent major depressive disorder (HCC) Increase Wellbutrin   10. Obesity  Increase wellbutrin      Patient Instructions  Follow-up in office every 3 months  needed to maintain chronic opiate medications per [place] law and [place] policy   Strongly recommend COVID booster #2! Especially if you'll be traveling.      Orders Placed This Encounter  Procedures  . CBC  . COMPLETE METABOLIC PANEL WITH GFR  . Lipid panel  . Hemoglobin A1c  . Fe+TIBC+Fer    Meds ordered this encounter  Medications  . DISCONTD: buPROPion (WELLBUTRIN XL) 150 MG 24 hr tablet    Sig: Take 1 tablet (150 mg total) by mouth daily.    Dispense:  90 tablet    Refill:  3  . ALPRAZolam (XANAX) 0.25 MG tablet    Sig: Take 1-2 tablets (0.25-0.5 mg total) by mouth 3 (three) times daily as needed for anxiety or sleep.    Dispense:  30 tablet    Refill:  0  . DISCONTD: Tdap (BOOSTRIX) 5-2.5-18.5 LF-MCG/0.5 injection    Sig: Inject 0.5 mLs into the muscle once for 1 dose.    Dispense:  0.5 mL    Refill:  0  . Tdap (BOOSTRIX) 5-2.5-18.5 LF-MCG/0.5 injection    Sig: Inject 0.5 mLs into the muscle once for 1 dose.    Dispense:  0.5 mL    Refill:  0  . buPROPion (WELLBUTRIN XL) 150 MG 24 hr tablet    Sig: Take 2 tablets (300 mg total) by mouth daily.    Dispense:  90 tablet    Refill:  3  . venlafaxine XR (EFFEXOR-XR) 75 MG 24 hr capsule    Sig: Take 1 capsule (75 mg total) by mouth daily.    Dispense:  90 capsule    Refill:  3     See below for relevant physical exam findings  See below for recent lab and  imaging results reviewed  Medications, allergies, PMH, PSH, SocH, FamH reviewed below    Follow-up instructions: Return in about 3 months (around 07/30/2021) for MAINTAIN OPIATE RX, SEE [place] SOONER IF NEEDED. CALL/MESSAGE W/ QUESTIONS.                                        Exam:  BP 129/65 (BP Location: Left Arm, Patient Position: Sitting, Cuff Size: Large)   Pulse 92   Temp 98.5 F (36.9 C) (Oral)   Wt (!) 305 lb (138.3 kg)   BMI 45.04 kg/m   Constitutional: VS see above. General Appearance: alert,  well-developed, well-nourished, NAD  Neck: No masses, trachea midline.   Respiratory: Normal respiratory effort. no wheeze, no rhonchi, no rales  Cardiovascular: S1/S2 normal, no murmur, no rub/gallop auscultated. RRR.   Musculoskeletal: Gait normal.   Neurological: Normal balance/coordination. No tremor.  Skin: warm, dry, intact.   Psychiatric: Normal judgment/insight. Normal mood and affect. Oriented x3.   Current Meds  Medication Sig  . aspirin 81 MG chewable tablet Chew by mouth daily.  Marland Kitchen atorvastatin (LIPITOR) 40 MG tablet Take 1 tablet (40 mg total) by mouth daily.  . blood glucose meter kit and supplies KIT Check morning fasting blood glucose and up to four times daily as directed.  . Cholecalciferol (VITAMIN D3) 50 MCG (2000 UT) TABS Take 2,000 Units by mouth daily.  . enalapril (VASOTEC) 10 MG tablet Take 1 tablet (10 mg total) by mouth daily.  . famotidine (PEPCID) 20 MG tablet Take one tablet every morning or at bedtime  . fluticasone (FLONASE) 50 MCG/ACT nasal spray Place 2 sprays into both nostrils daily as needed for allergies or rhinitis.   Marland Kitchen glucose blood test strip Use up to 4 times per day as directed with glucometer. Disp: 100. Refill x99  . HYDROcodone-acetaminophen (NORCO/VICODIN) 5-325 MG tablet Take 1 tablet by mouth every 8 (eight) hours as needed for moderate pain or severe pain.  . Multiple Vitamin tablet Take 1 tablet by mouth daily.   . Omega-3 Fatty Acids (FISH OIL) 1000 MG CAPS Take 1,000 mg by mouth daily.   Glory Rosebush Delica Lancets 53G MISC As directed up to qid  . Semaglutide (RYBELSUS) 7 MG TABS Take 1 tablet by mouth daily.  . traZODone (DESYREL) 50 MG tablet Take 1 tablet (50 mg total) by mouth at bedtime as needed.  . triamterene-hydrochlorothiazide (MAXZIDE-25) 37.5-25 MG tablet Take 1 tablet by mouth daily.  . [DISCONTINUED] ALPRAZolam (XANAX) 0.25 MG tablet Take 1-2 tablets (0.25-0.5 mg total) by mouth 3 (three) times daily as needed for  anxiety or sleep.  . [DISCONTINUED] buPROPion (WELLBUTRIN XL) 150 MG 24 hr tablet TAKE 1 TABLET BY MOUTH EVERY DAY IN THE MORNING  . [DISCONTINUED] Tdap (BOOSTRIX) 5-2.5-18.5 LF-MCG/0.5 injection Inject 0.5 mLs into the muscle once for 1 dose.  . [DISCONTINUED] venlafaxine XR (EFFEXOR-XR) 75 MG 24 hr capsule TAKE 2 CAPSULES DAILY WITH BREAKFAST    Allergies  Allergen Reactions  . Latex Itching and Rash  . Metformin And Related Other (See Comments)    Malaise, fatigue  . Morphine And Related Rash and Other (See Comments)    Agitation  . Tape Swelling and Other (See Comments)    Burning sensation  . Tramadol Palpitations    Heart fluttering    Patient Active Problem List   Diagnosis Date Noted  . Unilateral primary  osteoarthritis, left hip 03/01/2020  . Status post total replacement of left hip 03/01/2020  . Acute kidney injury superimposed on CKD ([place]) 09/17/2019  . Insomnia secondary to anxiety 09/17/2019  . Increase in serum creatinine from prior measurement 09/17/2019  . Abnormal nuclear stress test 09/04/2019  . Mild diastolic dysfunction 58/08/9832  . Right bundle branch block 07/05/2019  . Left axis deviation 07/05/2019  . Decreased exercise tolerance 07/05/2019  . Sphincter of Oddi dysfunction 07/05/2019  . Multiple pulmonary nodules determined by computed tomography of lung 07/05/2019  . Abnormal QT interval present on electrocardiogram 07/05/2019  . Irregular heart beats 06/29/2019  . Family history of CHF (congestive heart failure) 06/29/2019  . Recurrent major depressive disorder, in partial remission ([place]) 06/12/2019  . Encounter for diabetic foot exam ([place]) 06/12/2019  . Grief reaction 05/18/2019  . History of colon polyps 01/17/2019  . Encounter for long-term (current) use of high-risk medication 01/17/2019  . Primary osteoarthritis of left hip 12/19/2018  . Left hip pain 10/13/2018  . Centrilobular emphysema ([place]) 07/25/2018  . Closed compression fracture of  body of L1 vertebra ([place]) 07/25/2018  . Renal cyst, acquired, left 07/25/2018  . Primary osteoarthritis involving multiple joints 07/06/2018  . Diabetic eye exam ([place]) 07/06/2018  . History of multiple pulmonary nodules 07/06/2018  . Encounter for screening for lung cancer 07/06/2018  . Type 2 diabetes mellitus with hyperglycemia, without long-term current use of insulin ([place]) 04/04/2018  . Dyslipidemia associated with type 2 diabetes mellitus ([place]) 04/04/2018  . Class 3 severe obesity due to excess calories with serious comorbidity and body mass index (BMI) of 40.0 to 44.9 in adult ([place]) 04/03/2018  . Prediabetes 04/03/2018  . Encounter for weight loss counseling 04/03/2018  . Onychomycosis 03/28/2018  . Morbid obesity ([place]) 02/28/2018  . Bilateral foot pain 02/28/2018  . Benign cyst of right breast 01/26/2018  . Abnormal mammogram of right breast 01/20/2018  . Chronic kidney disease, stage 3a ([place]) 12/31/2017  . Encounter for chronic pain management 12/30/2017  . Chronic use of opiate for therapeutic purpose 12/30/2017  . History of melanoma 12/30/2017  . History of renal cell carcinoma 12/30/2017  . Arthritis of carpometacarpal [facility]) joint of left thumb 05/19/2017  . Controlled substance agreement signed 03/30/2017  . H/O acute pancreatitis 07/17/2016  . History of biliary stent insertion 07/17/2016  . Coronary artery calcification seen on CT scan 11/08/2015  . History of back surgery 09/23/2015  . Spondylosis of lumbar region without myelopathy or radiculopathy 08/30/2015  . Primary osteoarthritis of both knees 03/01/2015  . Cellulitis of face 02/19/2015  . Anxiety 07/24/2014  . Basal cell carcinoma 10/04/2013  . Depression, major 10/04/2013  . Squamous cell carcinoma 10/04/2013  . Dyspnea on exertion -> considered to be possible anginal equivalent 04/20/2012  . Essential hypertension 03/29/2012  . Former smoker 03/29/2012  . Gout 03/29/2012  . Hyperlipidemia 03/29/2012  .  Palpitations 03/29/2012  . OSA on CPAP 03/29/2012    Family History  Problem Relation Age of Onset  . Depression Mother   . Hyperlipidemia Mother   . Hypertension Mother   . Colon polyps Mother   . Heart attack Father   . Hyperlipidemia Father   . Hypertension Father   . Colon polyps Father   . Alcohol abuse Sister   . Depression Sister   . Alcohol abuse Son   . Alcohol abuse Son   . Alcohol abuse Sister   . Depression Sister   . Stomach cancer Brother   .  Colon cancer Neg Hx   . Esophageal cancer Neg Hx   . Rectal cancer Neg Hx     Social History   Tobacco Use  Smoking Status Former Smoker  . Packs/day: 1.00  . Years: 40.00  . Pack years: 40.00  . Types: Cigarettes  . Quit date: 12/21/2010  . Years since quitting: 10.3  Smokeless Tobacco Never Used    Past Surgical History:  Procedure Laterality Date  . ABDOMINAL HYSTERECTOMY    . ANKLE SURGERY Left    in her 75s  . BILE DUCT STENT PLACEMENT    . CARDIAC CATHETERIZATION    . CHOLECYSTECTOMY    . COLONOSCOPY    . LEFT HEART CATH AND CORONARY ANGIOGRAPHY N/A 09/05/2019   Procedure: LEFT HEART CATH AND CORONARY ANGIOGRAPHY;  Surgeon: Leonie Man, MD;  Location: [facility];  Service: Cardiovascular;  Laterality: N/A;  . LUMBAR FUSION     7 level   . MOHS SURGERY     x 11  . PARTIAL NEPHRECTOMY    . POLYPECTOMY    . TOTAL HIP ARTHROPLASTY Left 03/01/2020   Procedure: LEFT TOTAL HIP ARTHROPLASTY ANTERIOR APPROACH;  Surgeon: Mcarthur Rossetti, MD;  Location: [facility];  Service: Orthopedics;  Laterality: Left;  . UPPER GASTROINTESTINAL ENDOSCOPY      Immunization History  Administered Date(s) Administered  . Fluad Quad(high Dose 65+) 08/08/2019  . Influenza Split 07/23/2020  . Influenza,inj,Quad PF,6+ Mos 10/04/2018  . Influenza-Unspecified 08/23/2020  . Moderna Sars-Covid-2 Vaccination 01/16/2020, 02/13/2020, 10/15/2020  . Pneumococcal Conjugate-13 05/19/2017  . Pneumococcal  Polysaccharide-23 10/19/2011, 07/06/2018  . Tdap 04/26/2011  . Zoster 02/18/2014  . Zoster Recombinat (Shingrix) 07/28/2018, 10/06/2018    No results found for this or any previous visit (from the past 2160 hour(s)).  No results found.     All questions at time of visit were answered - patient instructed to contact office with any additional concerns or updates. ER/RTC precautions were reviewed with the patient as applicable.   Please note: manual typing as well as voice recognition software may have been used to produce this document - typos may escape review. Please contact Dr. Sheppard Coil for any needed clarifications.

## 2021-04-29 NOTE — Patient Instructions (Addendum)
Follow-up in office every 3 months needed to maintain chronic opiate medications per [place] law and [place]   Strongly recommend COVID booster #2! Especially if you'll be traveling.

## 2021-05-09 LAB — HEMOGLOBIN A1C
Hgb A1c MFr Bld: 6.2 % of total Hgb — ABNORMAL HIGH (ref <5.7)
Mean Plasma Glucose: 131 mg/dL
eAG (mmol/L): 7.3 mmol/L

## 2021-05-09 LAB — COMPLETE METABOLIC PANEL WITH GFR
AG Ratio: 1.5 (calc) (ref 1.0–2.5)
ALT: 32 U/L — ABNORMAL HIGH (ref 6–29)
AST: 25 U/L (ref 10–35)
Albumin: 3.9 g/dL (ref 3.6–5.1)
Alkaline phosphatase (APISO): 110 U/L (ref 37–153)
BUN/Creatinine Ratio: 15 (calc) (ref 6–22)
BUN: 17 mg/dL (ref 7–25)
CO2: 27 mmol/L (ref 20–32)
Calcium: 9.6 mg/dL (ref 8.6–10.4)
Chloride: 106 mmol/L (ref 98–110)
Creat: 1.13 mg/dL — ABNORMAL HIGH (ref 0.50–0.99)
GFR, Est African American: 57 mL/min/{1.73_m2} — ABNORMAL LOW (ref >=60)
GFR, Est Non African American: 50 mL/min/{1.73_m2} — ABNORMAL LOW (ref >=60)
Globulin: 2.6 g/dL (calc) (ref 1.9–3.7)
Glucose, Bld: 118 mg/dL — ABNORMAL HIGH (ref 65–99)
Potassium: 4.4 mmol/L (ref 3.5–5.3)
Sodium: 142 mmol/L (ref 135–146)
Total Bilirubin: 0.4 mg/dL (ref 0.2–1.2)
Total Protein: 6.5 g/dL (ref 6.1–8.1)

## 2021-05-09 LAB — CBC
HCT: 37.1 % (ref 35.0–45.0)
Hemoglobin: 11.9 g/dL (ref 11.7–15.5)
MCH: 28.9 pg (ref 27.0–33.0)
MCHC: 32.1 g/dL (ref 32.0–36.0)
MCV: 90 fL (ref 80.0–100.0)
MPV: 10 fL (ref 7.5–12.5)
Platelets: 351 10*3/uL (ref 140–400)
RBC: 4.12 10*6/uL (ref 3.80–5.10)
RDW: 13.1 % (ref 11.0–15.0)
WBC: 6.2 10*3/uL (ref 3.8–10.8)

## 2021-05-09 LAB — IRON,TIBC AND FERRITIN PANEL
%SAT: 19 % (calc) (ref 16–45)
Ferritin: 40 ng/mL (ref 16–288)
Iron: 70 ug/dL (ref 45–160)
TIBC: 374 mcg/dL (calc) (ref 250–450)

## 2021-05-09 LAB — LIPID PANEL
Cholesterol: 175 mg/dL (ref <200)
HDL: 58 mg/dL (ref >=50)
LDL Cholesterol (Calc): 92 mg/dL (calc)
Non-HDL Cholesterol (Calc): 117 mg/dL (calc) (ref <130)
Total CHOL/HDL Ratio: 3 (calc) (ref <5.0)
Triglycerides: 153 mg/dL — ABNORMAL HIGH (ref <150)

## 2021-06-02 ENCOUNTER — Ambulatory Visit: Payer: BC Managed Care – PPO | Admitting: Sports Medicine

## 2021-06-02 ENCOUNTER — Other Ambulatory Visit: Payer: Self-pay

## 2021-06-02 ENCOUNTER — Ambulatory Visit (INDEPENDENT_AMBULATORY_CARE_PROVIDER_SITE_OTHER): Payer: BC Managed Care – PPO

## 2021-06-02 DIAGNOSIS — M170 Bilateral primary osteoarthritis of knee: Secondary | ICD-10-CM

## 2021-06-02 NOTE — Progress Notes (Signed)
    Procedures performed today:    Procedure: Real-time Ultrasound Guided injection of the left knee Device: Samsung HS60  Verbal informed consent obtained.  Time-out conducted.  Noted no overlying erythema, induration, or other signs of local infection.  Skin prepped in a sterile fashion.  Local anesthesia: Topical Ethyl chloride.  With sterile technique and under real time ultrasound guidance: Noted trace effusion, 1 cc Kenalog 40, 2 cc lidocaine, 2 cc bupivacaine injected easily Completed without difficulty  Advised to call if fevers/chills, erythema, induration, drainage, or persistent bleeding.  Images permanently stored and available for review in PACS.  Impression: Technically successful ultrasound guided injection.  Procedure: Real-time Ultrasound Guided injection of the right knee Device: Samsung HS60  Verbal informed consent obtained.  Time-out conducted.  Noted no overlying erythema, induration, or other signs of local infection.  Skin prepped in a sterile fashion.  Local anesthesia: Topical Ethyl chloride.  With sterile technique and under real time ultrasound guidance: Noted trace effusion, 1 cc Kenalog 40, 2 cc lidocaine, 2 cc bupivacaine injected easily Completed without difficulty  Advised to call if fevers/chills, erythema, induration, drainage, or persistent bleeding.  Images permanently stored and available for review in PACS.  Impression: Technically successful ultrasound guided injection.  Independent interpretation of notes and tests performed by another provider:   None.  Brief History, Exam, Impression, and Recommendations:    Primary osteoarthritis of both knees This pleasant 70 year old female returns, she has bilateral knee osteoarthritis, there is an exacerbation of chronic process, she was last injected May 28, 2020, recurrence of pain, repeat bilateral knee injections today, we try to get viscosupplementation approved but it was too pricey. Return  to see me as needed.    ___________________________________________ Gwen Her. Dianah Field, M.D., ABFM., CAQSM. Primary Care and [place] Instructor of [place] [employer] [facility] [employer] Medicine

## 2021-06-02 NOTE — Assessment & Plan Note (Signed)
This pleasant 70 year old female returns, she has bilateral knee osteoarthritis, there is an exacerbation of chronic process, she was last injected May 28, 2020, recurrence of pain, repeat bilateral knee injections today, we try to get viscosupplementation approved but it was too pricey. Return to see me as needed.

## 2021-06-10 ENCOUNTER — Telehealth: Payer: Self-pay | Admitting: Osteopathic Medicine

## 2021-06-10 DIAGNOSIS — M47816 Spondylosis without myelopathy or radiculopathy, lumbar region: Secondary | ICD-10-CM

## 2021-06-10 MED ORDER — HYDROCODONE-ACETAMINOPHEN 5-325 MG PO TABS: 1.0000 | ORAL_TABLET | Freq: Three times a day (TID) | ORAL | 0 refills | Status: DC | PRN

## 2021-06-10 NOTE — Telephone Encounter (Signed)
Sent!

## 2021-06-10 NOTE — Telephone Encounter (Signed)
Patient calling in stating that she left a voicemail last Wednesday with PCP's CMA about a refill on her HYDROcodone-acetaminophen (NORCO/VICODIN) 5-325 MG tablet [[id number]] . States that she has not got a call back or heard anything back about this and needs a refill. Please contact and advise.

## 2021-06-10 NOTE — Telephone Encounter (Signed)
Pended prescription

## 2021-06-24 ENCOUNTER — Other Ambulatory Visit: Payer: Self-pay

## 2021-06-24 ENCOUNTER — Ambulatory Visit
Admission: RE | Admit: 2021-06-24 | Discharge: 2021-06-24 | Disposition: A | Discharge status: Home / Self Care | Payer: BC Managed Care – PPO | Source: Ambulatory Visit | Attending: Osteopathic Medicine | Admitting: Osteopathic Medicine

## 2021-06-24 DIAGNOSIS — Z1231 Encounter for screening mammogram for malignant neoplasm of breast: Secondary | ICD-10-CM

## 2021-06-26 ENCOUNTER — Other Ambulatory Visit: Payer: Self-pay | Admitting: Osteopathic Medicine

## 2021-06-26 DIAGNOSIS — R928 Other abnormal and inconclusive findings on diagnostic imaging of breast: Secondary | ICD-10-CM

## 2021-06-30 ENCOUNTER — Encounter: Payer: Self-pay | Admitting: Osteopathic Medicine

## 2021-06-30 DIAGNOSIS — F5105 Insomnia due to other mental disorder: Secondary | ICD-10-CM

## 2021-06-30 DIAGNOSIS — F419 Anxiety disorder, unspecified: Secondary | ICD-10-CM

## 2021-07-01 MED ORDER — ALPRAZOLAM 0.25 MG PO TABS: 0.2500 mg | ORAL_TABLET | Freq: Three times a day (TID) | ORAL | 0 refills | Status: DC | PRN

## 2021-07-18 ENCOUNTER — Ambulatory Visit
Admission: RE | Admit: 2021-07-18 | Discharge: 2021-07-18 | Disposition: A | Discharge status: Home / Self Care | Payer: BC Managed Care – PPO | Source: Ambulatory Visit | Attending: Osteopathic Medicine | Admitting: Osteopathic Medicine

## 2021-07-18 ENCOUNTER — Other Ambulatory Visit: Payer: Self-pay

## 2021-07-18 ENCOUNTER — Ambulatory Visit: Payer: BC Managed Care – PPO

## 2021-07-18 DIAGNOSIS — R928 Other abnormal and inconclusive findings on diagnostic imaging of breast: Secondary | ICD-10-CM | POA: Diagnosis not present

## 2021-07-18 DIAGNOSIS — R922 Inconclusive mammogram: Secondary | ICD-10-CM | POA: Diagnosis not present

## 2021-08-04 ENCOUNTER — Other Ambulatory Visit: Payer: Self-pay | Admitting: Osteopathic Medicine

## 2021-08-04 DIAGNOSIS — F3342 Major depressive disorder, recurrent, in full remission: Secondary | ICD-10-CM

## 2021-08-06 ENCOUNTER — Other Ambulatory Visit: Payer: Self-pay

## 2021-08-06 DIAGNOSIS — E1165 Type 2 diabetes mellitus with hyperglycemia: Secondary | ICD-10-CM

## 2021-08-06 DIAGNOSIS — E1169 Type 2 diabetes mellitus with other specified complication: Secondary | ICD-10-CM

## 2021-08-06 MED ORDER — ATORVASTATIN CALCIUM 40 MG PO TABS: 40.0000 mg | ORAL_TABLET | Freq: Every day | ORAL | 0 refills | Status: DC

## 2021-08-14 ENCOUNTER — Other Ambulatory Visit: Payer: Self-pay

## 2021-08-14 DIAGNOSIS — M47816 Spondylosis without myelopathy or radiculopathy, lumbar region: Secondary | ICD-10-CM

## 2021-08-14 MED ORDER — HYDROCODONE-ACETAMINOPHEN 5-325 MG PO TABS: 1.0000 | ORAL_TABLET | Freq: Three times a day (TID) | ORAL | 0 refills | Status: DC | PRN

## 2021-08-14 NOTE — Telephone Encounter (Signed)
Pt left a vm msg requesting med refill for hydrocodone-ace. Per pt, this is her second request. Rx pended.

## 2021-08-19 DIAGNOSIS — G4733 Obstructive sleep apnea (adult) (pediatric): Secondary | ICD-10-CM | POA: Diagnosis not present

## 2021-08-28 ENCOUNTER — Other Ambulatory Visit: Payer: Self-pay | Admitting: Osteopathic Medicine

## 2021-08-28 ENCOUNTER — Encounter: Payer: Self-pay | Admitting: Osteopathic Medicine

## 2021-08-28 DIAGNOSIS — F419 Anxiety disorder, unspecified: Secondary | ICD-10-CM

## 2021-08-28 DIAGNOSIS — F5105 Insomnia due to other mental disorder: Secondary | ICD-10-CM

## 2021-09-11 ENCOUNTER — Telehealth: Payer: Self-pay

## 2021-09-11 NOTE — Telephone Encounter (Signed)
PA renewal request received from [facility] for Ozempic 2 mg/1.5 mL. Pt has not been on this medication since 09/10/2019. Denied via CMM with this notation

## 2021-09-14 ENCOUNTER — Other Ambulatory Visit: Payer: Self-pay | Admitting: Osteopathic Medicine

## 2021-10-06 ENCOUNTER — Ambulatory Visit: Payer: BC Managed Care – PPO | Admitting: Family Medicine

## 2021-10-17 ENCOUNTER — Other Ambulatory Visit: Payer: Self-pay

## 2021-10-17 DIAGNOSIS — M47816 Spondylosis without myelopathy or radiculopathy, lumbar region: Secondary | ICD-10-CM

## 2021-10-17 MED ORDER — HYDROCODONE-ACETAMINOPHEN 5-325 MG PO TABS: 1.0000 | ORAL_TABLET | Freq: Three times a day (TID) | ORAL | 0 refills | Status: DC | PRN

## 2021-10-21 DIAGNOSIS — I700 Atherosclerosis of aorta: Secondary | ICD-10-CM | POA: Diagnosis not present

## 2021-10-21 DIAGNOSIS — C642 Malignant neoplasm of left kidney, except renal pelvis: Secondary | ICD-10-CM | POA: Diagnosis not present

## 2021-10-21 DIAGNOSIS — Z85528 Personal history of other malignant neoplasm of kidney: Secondary | ICD-10-CM | POA: Diagnosis not present

## 2021-10-21 DIAGNOSIS — N281 Cyst of kidney, acquired: Secondary | ICD-10-CM | POA: Diagnosis not present

## 2021-10-27 ENCOUNTER — Other Ambulatory Visit: Payer: Self-pay

## 2021-10-27 ENCOUNTER — Ambulatory Visit (INDEPENDENT_AMBULATORY_CARE_PROVIDER_SITE_OTHER): Payer: BC Managed Care – PPO

## 2021-10-27 ENCOUNTER — Other Ambulatory Visit: Payer: Self-pay | Admitting: Urology

## 2021-10-27 DIAGNOSIS — C642 Malignant neoplasm of left kidney, except renal pelvis: Secondary | ICD-10-CM | POA: Diagnosis not present

## 2021-10-27 DIAGNOSIS — M810 Age-related osteoporosis without current pathological fracture: Secondary | ICD-10-CM | POA: Diagnosis not present

## 2021-10-27 DIAGNOSIS — I700 Atherosclerosis of aorta: Secondary | ICD-10-CM | POA: Diagnosis not present

## 2021-10-28 ENCOUNTER — Encounter: Payer: Self-pay | Admitting: Family Medicine

## 2021-10-28 ENCOUNTER — Ambulatory Visit (INDEPENDENT_AMBULATORY_CARE_PROVIDER_SITE_OTHER): Payer: BC Managed Care – PPO | Admitting: Family Medicine

## 2021-10-28 VITALS — BP 126/85 | HR 88 | Temp 97.4°F | Ht 69.0 in | Wt 309.2 lb

## 2021-10-28 DIAGNOSIS — Z79891 Long term (current) use of opiate analgesic: Secondary | ICD-10-CM

## 2021-10-28 DIAGNOSIS — E1165 Type 2 diabetes mellitus with hyperglycemia: Secondary | ICD-10-CM

## 2021-10-28 DIAGNOSIS — M47816 Spondylosis without myelopathy or radiculopathy, lumbar region: Secondary | ICD-10-CM

## 2021-10-28 DIAGNOSIS — Z0000 Encounter for general adult medical examination without abnormal findings: Secondary | ICD-10-CM | POA: Diagnosis not present

## 2021-10-28 DIAGNOSIS — F331 Major depressive disorder, recurrent, moderate: Secondary | ICD-10-CM

## 2021-10-28 DIAGNOSIS — E1169 Type 2 diabetes mellitus with other specified complication: Secondary | ICD-10-CM

## 2021-10-28 DIAGNOSIS — E785 Hyperlipidemia, unspecified: Secondary | ICD-10-CM

## 2021-10-28 DIAGNOSIS — I10 Essential (primary) hypertension: Secondary | ICD-10-CM

## 2021-10-28 MED ORDER — TRIAMTERENE-HCTZ 37.5-25 MG PO TABS: 1.0000 | ORAL_TABLET | Freq: Every day | ORAL | 3 refills | Status: DC

## 2021-10-28 MED ORDER — VENLAFAXINE HCL ER 75 MG PO CP24: 75.0000 mg | ORAL_CAPSULE | Freq: Every day | ORAL | 3 refills | Status: DC

## 2021-10-28 MED ORDER — ENALAPRIL MALEATE 10 MG PO TABS: 10.0000 mg | ORAL_TABLET | Freq: Every day | ORAL | 3 refills | Status: DC

## 2021-10-28 MED ORDER — TIRZEPATIDE 7.5 MG/0.5ML ~~LOC~~ SOAJ: 7.5000 mg | SUBCUTANEOUS | 1 refills | Status: DC

## 2021-10-28 MED ORDER — TIRZEPATIDE 2.5 MG/0.5ML ~~LOC~~ SOAJ: 2.5000 mg | SUBCUTANEOUS | 0 refills | Status: DC

## 2021-10-28 MED ORDER — BUPROPION HCL ER (XL) 150 MG PO TB24: 300.0000 mg | ORAL_TABLET | Freq: Every day | ORAL | 3 refills | Status: DC

## 2021-10-28 MED ORDER — ATORVASTATIN CALCIUM 40 MG PO TABS: 40.0000 mg | ORAL_TABLET | Freq: Every day | ORAL | 3 refills | Status: DC

## 2021-10-28 MED ORDER — TIRZEPATIDE 5 MG/0.5ML ~~LOC~~ SOAJ: 5.0000 mg | SUBCUTANEOUS | 0 refills | Status: DC

## 2021-10-28 MED ORDER — HYDROCODONE-ACETAMINOPHEN 5-325 MG PO TABS: 1.0000 | ORAL_TABLET | Freq: Three times a day (TID) | ORAL | 0 refills | Status: DC | PRN

## 2021-10-28 NOTE — Progress Notes (Signed)
Bonnie [name] - 70 y.o. female MRN [id number]  Date of birth: [birth year]  Subjective Chief Complaint  Patient presents with   Annual Exam    HPI Bonnie [name] is a 70 year old female here today for annual exam.  She reports she is doing okay.  She does admit to some increased stress related to health of her husband.  She reports she has had continued difficulty losing weight.  She is currently taking Rybelsus for management of her diabetes.  She has has not noted any appetite suppression with Rybelsus.  She denies any side effects related to medication.  She is treated with chronic opioids for management of pain related to osteoarthritis.  She is unable to utilize chronic NSAIDs due to chronic kidney disease and history of renal cell carcinoma.  She denies any side effects from opioids.  These continue to be effective in managing her pain.  She reports that she does not exercise regularly.  She does admit to stress eating frequently which is contributing to her weight gain.  She is a non-smoker.  She does not consume alcohol regularly.  She is up-to-date on colon cancer screening and mammogram.  ROS:  A comprehensive ROS was completed and negative except as noted per HPI    Allergies  Allergen Reactions   Latex Itching and Rash   Metformin And Related Other (See Comments)    Malaise, fatigue   Morphine And Related Rash and Other (See Comments)    Agitation   Tape Swelling and Other (See Comments)    Burning sensation   Tramadol Palpitations    Heart fluttering    Past Medical History:  Diagnosis Date   Abnormal mammogram of right breast 01/20/2018   Anxiety    Benign cyst of right breast 01/26/2018   Blood transfusion without reported diagnosis    Cataract    Centrilobular emphysema ([place]) 07/25/2018   mild   Chronic kidney disease, stage 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   Chronic low back pain    Colon polyps    Depression    Diabetes mellitus without complication  (HCC)    Emphysema of lung ([place])    Fatty liver    Hypertension    Melanoma ([place])    R upper extremity   Melanoma in situ of right upper extremity (HCC)    Mild diastolic dysfunction 03/29/8118   Osteoarthritis of lumbar spine    Osteoporosis    Renal cancer ([place])    Skin cancer    Sleep apnea 2008   cpap nightly   Sphincter of Oddi dysfunction    bile duct    Past Surgical History:  Procedure Laterality Date   ABDOMINAL HYSTERECTOMY     ANKLE SURGERY Left    in her 38s   BILE DUCT STENT PLACEMENT     CARDIAC CATHETERIZATION     CHOLECYSTECTOMY     COLONOSCOPY     LEFT HEART CATH AND CORONARY ANGIOGRAPHY N/A 09/05/2019   Procedure: LEFT HEART CATH AND CORONARY ANGIOGRAPHY;  Surgeon: Leonie Man, MD;  Location: [facility];  Service: Cardiovascular;  Laterality: N/A;   LUMBAR FUSION     7 level    MOHS SURGERY     x 11   PARTIAL NEPHRECTOMY     POLYPECTOMY     TOTAL HIP ARTHROPLASTY Left 03/01/2020   Procedure: LEFT TOTAL HIP ARTHROPLASTY ANTERIOR APPROACH;  Surgeon: Mcarthur Rossetti, MD;  Location: [facility];  Service: Orthopedics;  Laterality: Left;   UPPER GASTROINTESTINAL ENDOSCOPY      Social History   Socioeconomic History   Marital status: Married    Spouse name: Not on file   Number of children: Not on file   Years of education: Not on file   Highest education level: Not on file  Occupational History   Not on file  Tobacco Use   Smoking status: Former    Packs/day: 1.00    Years: 40.00    Pack years: 40.00    Types: Cigarettes    Quit date: 12/21/2010    Years since quitting: 10.8   Smokeless tobacco: Never  Vaping Use   Vaping Use: Former   Devices: 2012-2014 while quitting cigaretter  Substance and Sexual Activity   Alcohol use: Yes    Alcohol/week: 1.0 - 2.0 standard drink    Types: 1 - 2 Standard drinks or equivalent per week   Drug use: No   Sexual activity: Yes    Birth control/protection: Surgical, None  Other Topics  Concern   Not on file  Social History Narrative   Not on file   Social Determinants of Health   Financial Resource Strain: Not on file  Food Insecurity: Not on file  Transportation Needs: Not on file  Physical Activity: Not on file  Stress: Not on file  Social Connections: Not on file    Family History  Problem Relation Age of Onset   Depression Mother    Hyperlipidemia Mother    Hypertension Mother    Colon polyps Mother    Heart attack Father    Hyperlipidemia Father    Hypertension Father    Colon polyps Father    Alcohol abuse Sister    Depression Sister    Alcohol abuse Son    Alcohol abuse Son    Alcohol abuse Sister    Depression Sister    Stomach cancer Brother    Colon cancer Neg Hx    Esophageal cancer Neg Hx    Rectal cancer Neg Hx     Health Maintenance  Topic Date Due   OPHTHALMOLOGY EXAM  08/28/2021   TETANUS/TDAP  04/29/2022 (Originally 04/25/2021)   HEMOGLOBIN A1C  11/08/2021   FOOT EXAM  04/29/2022   COLONOSCOPY (Pts 45-58yrs Insurance coverage will need to be confirmed)  12/07/2022   MAMMOGRAM  06/25/2023   Pneumonia Vaccine 63+ Years old  Completed   INFLUENZA VACCINE  Completed   DEXA SCAN  Completed   COVID-19 Vaccine  Completed   Hepatitis C Screening  Completed   Zoster Vaccines- Shingrix  Completed   HPV VACCINES  Aged Out     ----------------------------------------------------------------------------------------------------------------------------------------------------------------------------------------------------------------- Physical Exam BP 126/85   Pulse 88   Temp (!) 97.4 F (36.3 C)   Ht 5\' 9"  (1.753 m)   Wt (!) 309 lb 3.2 oz (140.3 kg)   SpO2 95%   BMI 45.66 kg/m   Physical Exam Constitutional:      General: She is not in acute distress. HENT:     Head: Normocephalic and atraumatic.     Right Ear: Tympanic membrane and ear canal normal.     Left Ear: Tympanic membrane and ear canal normal.     Nose: Nose  normal.  Eyes:     General: No scleral icterus.    Conjunctiva/sclera: Conjunctivae normal.  Neck:     Thyroid: No thyromegaly.  Cardiovascular:     Rate and Rhythm: Normal rate and regular rhythm.  Heart sounds: Normal heart sounds.  Pulmonary:     Effort: Pulmonary effort is normal.     Breath sounds: Normal breath sounds.  Abdominal:     General: Bowel sounds are normal. There is no distension.     Palpations: Abdomen is soft.     Tenderness: There is no abdominal tenderness. There is no guarding.  Musculoskeletal:        General: Normal range of motion.     Cervical back: Normal range of motion and neck supple.  Lymphadenopathy:     Cervical: No cervical adenopathy.  Skin:    General: Skin is warm and dry.     Findings: No rash.  Neurological:     General: No focal deficit present.     Mental Status: She is alert and oriented to person, place, and time.     Cranial Nerves: No cranial nerve deficit.     Coordination: Coordination normal.  Psychiatric:        Mood and Affect: Mood normal.        Behavior: Behavior normal.    ------------------------------------------------------------------------------------------------------------------------------------------------------------------------------------------------------------------- Assessment and Plan  Type 2 diabetes mellitus with hyperglycemia, without long-term current use of insulin (HCC) Discussed changing Rybelsus to [facility] for better glucose control as well as weight management.  Chronic use of opiate for therapeutic purpose Her pain is well managed with current opioid prescription of hydrocodone 5/325 mg.  Updated opioid contract signed today.  PDMP reviewed and updated urine drug screen ordered.  We will continue at this time.  We discussed avoiding taking this with alprazolam.  Well adult exam Well adult Orders Placed This Encounter  Procedures   DRUG MONITOR, PANEL 5, SCREEN, URINE   COMPLETE  METABOLIC PANEL WITH GFR   CBC with Differential   HgB A1c  Screenings: Up-to-date Immunizations: Up-to-date Anticipatory guidance/risk factor reduction: Recommendations per AVS.   Meds ordered this encounter  Medications   triamterene-hydrochlorothiazide (MAXZIDE-25) 37.5-25 MG tablet    Sig: Take 1 tablet by mouth daily.    Dispense:  90 tablet    Refill:  3   venlafaxine XR (EFFEXOR-XR) 75 MG 24 hr capsule    Sig: Take 1 capsule (75 mg total) by mouth daily.    Dispense:  90 capsule    Refill:  3   HYDROcodone-acetaminophen (NORCO/VICODIN) 5-325 MG tablet    Sig: Take 1 tablet by mouth every 8 (eight) hours as needed for moderate pain or severe pain.    Dispense:  60 tablet    Refill:  0   enalapril (VASOTEC) 10 MG tablet    Sig: Take 1 tablet (10 mg total) by mouth daily.    Dispense:  90 tablet    Refill:  3   buPROPion (WELLBUTRIN XL) 150 MG 24 hr tablet    Sig: Take 2 tablets (300 mg total) by mouth daily.    Dispense:  90 tablet    Refill:  3   atorvastatin (LIPITOR) 40 MG tablet    Sig: Take 1 tablet (40 mg total) by mouth daily.    Dispense:  90 tablet    Refill:  3   tirzepatide (MOUNJARO) 2.5 MG/0.5ML Pen    Sig: Inject 2.5 mg into the skin once a week. Take for 28 days and increase to 5mg     Dispense:  2 mL    Refill:  0   tirzepatide (MOUNJARO) 5 MG/0.5ML Pen    Sig: Inject 5 mg into the skin once a week. Take for  28 days and increase to 7.5mg     Dispense:  2 mL    Refill:  0   tirzepatide (MOUNJARO) 7.5 MG/0.5ML Pen    Sig: Inject 7.5 mg into the skin once a week.    Dispense:  6 mL    Refill:  1    Return in about 3 months (around 01/28/2022) for HTN/DM.    This visit occurred during the SARS-CoV-2 public health emergency.  Safety protocols were in place, including screening questions prior to the visit, additional usage of staff PPE, and extensive cleaning of exam room while observing appropriate contact time as indicated for disinfecting solutions.

## 2021-10-28 NOTE — Assessment & Plan Note (Signed)
Discussed changing Rybelsus to [facility] for better glucose control as well as weight management.

## 2021-10-28 NOTE — Patient Instructions (Signed)
Discontinue Rybelsus and change to [facility].  Preventive Care 96 Years and Older, Female Preventive care refers to lifestyle choices and visits with your health care provider that can promote health and wellness. Preventive care visits are also called wellness exams. What can I expect for my preventive care visit? Counseling Your health care provider may ask you questions about your: Medical history, including: Past medical problems. Family medical history. Pregnancy and menstrual history. History of falls. Current health, including: Memory and ability to understand (cognition). Emotional well-being. Home life and relationship well-being. Sexual activity and sexual health. Lifestyle, including: Alcohol, nicotine or tobacco, and drug use. Access to firearms. Diet, exercise, and sleep habits. Work and work Statistician. Sunscreen use. Safety issues such as seatbelt and bike helmet use. Physical exam Your health care provider will check your: Height and weight. These may be used to calculate your BMI (body mass index). BMI is a measurement that tells if you are at a healthy weight. Waist circumference. This measures the distance around your waistline. This measurement also tells if you are at a healthy weight and may help predict your risk of certain diseases, such as type 2 diabetes and high blood pressure. Heart rate and blood pressure. Body temperature. Skin for abnormal spots. What immunizations do I need? Vaccines are usually given at various ages, according to a schedule. Your health care provider will recommend vaccines for you based on your age, medical history, and lifestyle or other factors, such as travel or where you work. What tests do I need? Screening Your health care provider may recommend screening tests for certain conditions. This may include: Lipid and cholesterol levels. Hepatitis C test. Hepatitis B test. HIV (human immunodeficiency virus) test. STI (sexually  transmitted infection) testing, if you are at risk. Lung cancer screening. Colorectal cancer screening. Diabetes screening. This is done by checking your blood sugar (glucose) after you have not eaten for a while (fasting). Mammogram. Talk with your health care provider about how often you should have regular mammograms. BRCA-related cancer screening. This may be done if you have a family history of breast, ovarian, tubal, or peritoneal cancers. Bone density scan. This is done to screen for osteoporosis. Talk with your health care provider about your test results, treatment options, and if necessary, the need for more tests. Follow these instructions at home: Eating and drinking  Eat a diet that includes fresh fruits and vegetables, whole grains, lean protein, and low-fat dairy products. Limit your intake of foods with high amounts of sugar, saturated fats, and salt. Take vitamin and mineral supplements as recommended by your health care provider. Do not drink alcohol if your health care provider tells you not to drink. If you drink alcohol: Limit how much you have to 0-1 drink a day. Know how much alcohol is in your drink. In the [place] one drink equals one 12 oz bottle of beer (355 mL), one 5 oz glass of wine (148 mL), or one 1 oz glass of hard liquor (44 mL). Lifestyle Brush your teeth every morning and night with fluoride toothpaste. Floss one time each day. Exercise for at least 30 minutes 5 or more days each week. Do not use any products that contain nicotine or tobacco. These products include cigarettes, chewing tobacco, and vaping devices, such as e-cigarettes. If you need help quitting, ask your health care provider. Do not use drugs. If you are sexually active, practice safe sex. Use a condom or other form of protection in order to prevent STIs.  Take aspirin only as told by your health care provider. Make sure that you understand how much to take and what form to take. Work with your  health care provider to find out whether it is safe and beneficial for you to take aspirin daily. Ask your health care provider if you need to take a cholesterol-lowering medicine (statin). Find healthy ways to manage stress, such as: Meditation, yoga, or listening to music. Journaling. Talking to a trusted person. Spending time with friends and family. Minimize exposure to UV radiation to reduce your risk of skin cancer. Safety Always wear your seat belt while driving or riding in a vehicle. Do not drive: If you have been drinking alcohol. Do not ride with someone who has been drinking. When you are tired or distracted. While texting. If you have been using any mind-altering substances or drugs. Wear a helmet and other protective equipment during sports activities. If you have firearms in your house, make sure you follow all gun safety procedures. What's next? Visit your health care provider once a year for an annual wellness visit. Ask your health care provider how often you should have your eyes and teeth checked. Stay up to date on all vaccines. This information is not intended to replace advice given to you by your health care provider. Make sure you discuss any questions you have with your health care provider. Document Revised: 06/04/2021 Document Reviewed: 06/04/2021 Elsevier Patient Education  [place].

## 2021-10-28 NOTE — Assessment & Plan Note (Signed)
Her pain is well managed with current opioid prescription of hydrocodone 5/325 mg.  Updated opioid contract signed today.  PDMP reviewed and updated urine drug screen ordered.  We will continue at this time.  We discussed avoiding taking this with alprazolam.

## 2021-10-28 NOTE — Assessment & Plan Note (Signed)
Well adult Orders Placed This Encounter  Procedures  . DRUG MONITOR, PANEL 5, SCREEN, URINE  . COMPLETE METABOLIC PANEL WITH GFR  . CBC with Differential  . HgB A1c  Screenings: Up-to-date Immunizations: Up-to-date Anticipatory guidance/risk factor reduction: Recommendations per AVS.

## 2021-10-29 LAB — CBC WITH DIFFERENTIAL/PLATELET
Absolute Monocytes: 515 cells/uL (ref 200–950)
Basophils Absolute: 50 cells/uL (ref 0–200)
Basophils Relative: 0.8 %
Eosinophils Absolute: 180 cells/uL (ref 15–500)
Eosinophils Relative: 2.9 %
HCT: 39.1 % (ref 35.0–45.0)
Hemoglobin: 12.6 g/dL (ref 11.7–15.5)
Lymphs Abs: 1581 cells/uL (ref 850–3900)
MCH: 29.1 pg (ref 27.0–33.0)
MCHC: 32.2 g/dL (ref 32.0–36.0)
MCV: 90.3 fL (ref 80.0–100.0)
MPV: 10 fL (ref 7.5–12.5)
Monocytes Relative: 8.3 %
Neutro Abs: 3875 cells/uL (ref 1500–7800)
Neutrophils Relative %: 62.5 %
Platelets: 331 10*3/uL (ref 140–400)
RBC: 4.33 10*6/uL (ref 3.80–5.10)
RDW: 12.7 % (ref 11.0–15.0)
Total Lymphocyte: 25.5 %
WBC: 6.2 10*3/uL (ref 3.8–10.8)

## 2021-10-29 LAB — DRUG MONITOR, PANEL 5, SCREEN, URINE
Amphetamines: NEGATIVE ng/mL (ref <500)
Barbiturates: NEGATIVE ng/mL (ref <300)
Benzodiazepines: NEGATIVE ng/mL (ref <100)
Cocaine Metabolite: NEGATIVE ng/mL (ref <150)
Creatinine: 112.7 mg/dL (ref >=20.0)
Marijuana Metabolite: NEGATIVE ng/mL (ref <20)
Methadone Metabolite: NEGATIVE ng/mL (ref <100)
Opiates: NEGATIVE ng/mL (ref <100)
Oxidant: NEGATIVE ug/mL (ref <200)
Oxycodone: NEGATIVE ng/mL (ref <100)
pH: 6.5 (ref 4.5–9.0)

## 2021-10-29 LAB — COMPLETE METABOLIC PANEL WITH GFR
AG Ratio: 1.4 (calc) (ref 1.0–2.5)
ALT: 22 U/L (ref 6–29)
AST: 20 U/L (ref 10–35)
Albumin: 4.2 g/dL (ref 3.6–5.1)
Alkaline phosphatase (APISO): 100 U/L (ref 37–153)
BUN/Creatinine Ratio: 16 (calc) (ref 6–22)
BUN: 18 mg/dL (ref 7–25)
CO2: 25 mmol/L (ref 20–32)
Calcium: 10.2 mg/dL (ref 8.6–10.4)
Chloride: 106 mmol/L (ref 98–110)
Creat: 1.16 mg/dL — ABNORMAL HIGH (ref 0.60–1.00)
Globulin: 3 g/dL (calc) (ref 1.9–3.7)
Glucose, Bld: 105 mg/dL — ABNORMAL HIGH (ref 65–99)
Potassium: 4.7 mmol/L (ref 3.5–5.3)
Sodium: 144 mmol/L (ref 135–146)
Total Bilirubin: 0.4 mg/dL (ref 0.2–1.2)
Total Protein: 7.2 g/dL (ref 6.1–8.1)
eGFR: 51 mL/min/{1.73_m2} — ABNORMAL LOW (ref >=60)

## 2021-10-29 LAB — HEMOGLOBIN A1C
Hgb A1c MFr Bld: 5.9 % of total Hgb — ABNORMAL HIGH (ref <5.7)
Mean Plasma Glucose: 123 mg/dL
eAG (mmol/L): 6.8 mmol/L

## 2021-10-29 LAB — DM TEMPLATE

## 2021-10-30 DIAGNOSIS — Z85528 Personal history of other malignant neoplasm of kidney: Secondary | ICD-10-CM | POA: Diagnosis not present

## 2021-11-02 ENCOUNTER — Encounter: Payer: Self-pay | Admitting: Family Medicine

## 2021-11-03 DIAGNOSIS — L814 Other melanin hyperpigmentation: Secondary | ICD-10-CM | POA: Diagnosis not present

## 2021-11-03 DIAGNOSIS — L821 Other seborrheic keratosis: Secondary | ICD-10-CM | POA: Diagnosis not present

## 2021-11-03 DIAGNOSIS — D235 Other benign neoplasm of skin of trunk: Secondary | ICD-10-CM | POA: Diagnosis not present

## 2021-11-03 DIAGNOSIS — B079 Viral wart, unspecified: Secondary | ICD-10-CM | POA: Diagnosis not present

## 2021-11-03 DIAGNOSIS — D485 Neoplasm of uncertain behavior of skin: Secondary | ICD-10-CM | POA: Diagnosis not present

## 2021-11-03 NOTE — Telephone Encounter (Signed)
Did they give a reason for denial?

## 2021-11-19 DIAGNOSIS — G4733 Obstructive sleep apnea (adult) (pediatric): Secondary | ICD-10-CM | POA: Diagnosis not present

## 2021-11-21 ENCOUNTER — Encounter: Payer: Self-pay | Admitting: Family Medicine

## 2021-11-21 DIAGNOSIS — F419 Anxiety disorder, unspecified: Secondary | ICD-10-CM

## 2021-11-24 MED ORDER — ALPRAZOLAM 0.25 MG PO TABS: 0.2500 mg | ORAL_TABLET | Freq: Three times a day (TID) | ORAL | 0 refills | Status: DC | PRN

## 2021-12-01 LAB — HM DIABETES EYE EXAM

## 2021-12-05 ENCOUNTER — Ambulatory Visit (INDEPENDENT_AMBULATORY_CARE_PROVIDER_SITE_OTHER): Payer: BC Managed Care – PPO

## 2021-12-05 ENCOUNTER — Other Ambulatory Visit: Payer: Self-pay

## 2021-12-05 ENCOUNTER — Ambulatory Visit: Payer: BC Managed Care – PPO | Admitting: Sports Medicine

## 2021-12-05 DIAGNOSIS — M7541 Impingement syndrome of right shoulder: Secondary | ICD-10-CM | POA: Diagnosis not present

## 2021-12-05 DIAGNOSIS — M170 Bilateral primary osteoarthritis of knee: Secondary | ICD-10-CM

## 2021-12-05 DIAGNOSIS — M25511 Pain in right shoulder: Secondary | ICD-10-CM | POA: Diagnosis not present

## 2021-12-05 NOTE — Progress Notes (Signed)
° ° °  Procedures performed today:    Procedure: Real-time Ultrasound Guided injection of the left knee Device: Samsung HS60  Verbal informed consent obtained.  Time-out conducted.  Noted no overlying erythema, induration, or other signs of local infection.  Skin prepped in a sterile fashion.  Local anesthesia: Topical Ethyl chloride.  With sterile technique and under real time ultrasound guidance: Noted trace effusion, 1 cc Kenalog 40, 2 cc lidocaine, 2 cc bupivacaine injected easily Completed without difficulty  Advised to call if fevers/chills, erythema, induration, drainage, or persistent bleeding.  Images permanently stored and available for review in PACS.  Impression: Technically successful ultrasound guided injection.   Procedure: Real-time Ultrasound Guided injection of the right knee Device: Samsung HS60  Verbal informed consent obtained.  Time-out conducted.  Noted no overlying erythema, induration, or other signs of local infection.  Skin prepped in a sterile fashion.  Local anesthesia: Topical Ethyl chloride.  With sterile technique and under real time ultrasound guidance: Noted trace effusion, 1 cc Kenalog 40, 2 cc lidocaine, 2 cc bupivacaine injected easily Completed without difficulty  Advised to call if fevers/chills, erythema, induration, drainage, or persistent bleeding.  Images permanently stored and available for review in PACS.  Impression: Technically successful ultrasound guided injection.  Independent interpretation of notes and tests performed by another provider:   None.  Brief History, Exam, Impression, and Recommendations:    Primary osteoarthritis of both knees Bilateral knee osteoarthritis, exacerbation of chronic process, bilateral injections today, last injected in June of this year. Return as needed. Of note viscosupplementation was approved but not sufficiently covered.  Impingement syndrome, shoulder, right Pain over the deltoid, worse at  night, positive Neer's, Hawkins, empty can signs, explained the anatomy, adding x-rays, cuff conditioning over the next 6 weeks, return to see me, injection if no better.    ___________________________________________ Gwen Her. Dianah Field, M.D., ABFM., CAQSM. Primary Care and [place] Instructor of [place] [facility]

## 2021-12-05 NOTE — Assessment & Plan Note (Signed)
Pain over the deltoid, worse at night, positive Neer's, Hawkins, empty can signs, explained the anatomy, adding x-rays, cuff conditioning over the next 6 weeks, return to see me, injection if no better.

## 2021-12-05 NOTE — Assessment & Plan Note (Signed)
Bilateral knee osteoarthritis, exacerbation of chronic process, bilateral injections today, last injected in June of this year. Return as needed. Of note viscosupplementation was approved but not sufficiently covered.

## 2021-12-10 ENCOUNTER — Encounter: Payer: Self-pay | Admitting: Family Medicine

## 2021-12-11 ENCOUNTER — Telehealth (INDEPENDENT_AMBULATORY_CARE_PROVIDER_SITE_OTHER): Payer: BC Managed Care – PPO | Admitting: Family Medicine

## 2021-12-11 ENCOUNTER — Encounter: Payer: Self-pay | Admitting: Family Medicine

## 2021-12-11 VITALS — Temp 98.8°F | Ht 70.0 in | Wt 275.0 lb

## 2021-12-11 DIAGNOSIS — U071 COVID-19: Secondary | ICD-10-CM

## 2021-12-11 NOTE — Progress Notes (Signed)
Virtual Video Visit via MyChart Note  I connected with  Bonnie [name] on 12/11/21 at  1:20 PM EST by the video enabled telemedicine application for MyChart, and verified that I am speaking with the correct person using two identifiers.   I introduced myself as a Designer, jewellery with the practice. We discussed the limitations of evaluation and management by telemedicine and the availability of in person appointments. The patient expressed understanding and agreed to proceed.  Participating parties in this visit include: The patient and the nurse practitioner listed.  The patient is: At home I am: In the office - [place]  Subjective:    CC:  Chief Complaint  Patient presents with   Covid Positive    Congested and excessive mucus. Taking OTC Mucinex.     HPI: Bonnie [name] is a 70 y.o. year old female presenting today via [place] today for COVID infection.  Patient states she initially started with a mild headache 5 days ago and had a negative COVID test 3 days ago. Yesterday she started feeling more congested and running a fever. She had a positive COVID test yesterday. States she feels like she is already on the mend, feeling better. Still having a congested cough, but reports O2 sats have remained 97-98% and she has not had any more fevers. She has been getting good results with Mucinex, OTC cough/cold/analgesics, warm liquids with honey, and vitamins.   She doesn't think she needs anything, but wanted to check in given her age to ensure she didn't need to be doing anything differently.    Past medical history, Surgical history, Family history not pertinant except as noted below, Social history, Allergies, and medications have been entered into the medical record, reviewed, and corrections made.   Review of Systems:  All review of systems negative except what is listed in the HPI   Objective:    General:  Speaking clearly in complete sentences. Absent  shortness of breath noted.   Alert and oriented x3.   Normal judgment.  Absent acute distress.   Impression and Recommendations:    1. COVID-19 Patient is doing remarkably well with conservative measures. Discussed that she would be eligible for antivirals if she was interested. States that she is feeling relatively good and would prefer to continue with conservative measures. She will let us know if she begins feeling worse before the weekend. Continue supportive measures including rest, hydration, humidifier use, steam showers, warm compresses to sinuses, warm liquids with lemon and honey, and over-the-counter cough, cold, and analgesics as needed.  Patient aware of signs/symptoms requiring further/urgent evaluation.    Follow-up if symptoms worsen or fail to improve.    I discussed the assessment and treatment plan with the patient. The patient was provided an opportunity to ask questions and all were answered. The patient agreed with the plan and demonstrated an understanding of the instructions.   The patient was advised to call back or seek an in-person evaluation if the symptoms worsen or if the condition fails to improve as anticipated.  I spent 20 minutes dedicated to the care of this patient on the date of this encounter to include pre-visit chart review of prior notes and results, face-to-face time with the patient, and post-visit ordering of testing as indicated.   Terrilyn Saver, NP

## 2021-12-19 DIAGNOSIS — G4733 Obstructive sleep apnea (adult) (pediatric): Secondary | ICD-10-CM | POA: Diagnosis not present

## 2022-01-16 ENCOUNTER — Ambulatory Visit: Payer: BC Managed Care – PPO | Admitting: Sports Medicine

## 2022-01-19 DIAGNOSIS — G4733 Obstructive sleep apnea (adult) (pediatric): Secondary | ICD-10-CM | POA: Diagnosis not present

## 2022-01-28 ENCOUNTER — Ambulatory Visit: Payer: BC Managed Care – PPO | Admitting: Family Medicine

## 2022-01-28 ENCOUNTER — Encounter: Payer: Self-pay | Admitting: Family Medicine

## 2022-01-28 ENCOUNTER — Other Ambulatory Visit: Payer: Self-pay | Admitting: Family Medicine

## 2022-01-28 ENCOUNTER — Other Ambulatory Visit: Payer: Self-pay

## 2022-01-28 VITALS — BP 115/75 | HR 111 | Ht 69.0 in | Wt 308.0 lb

## 2022-01-28 DIAGNOSIS — E1165 Type 2 diabetes mellitus with hyperglycemia: Secondary | ICD-10-CM

## 2022-01-28 DIAGNOSIS — F3341 Major depressive disorder, recurrent, in partial remission: Secondary | ICD-10-CM

## 2022-01-28 DIAGNOSIS — I10 Essential (primary) hypertension: Secondary | ICD-10-CM | POA: Diagnosis not present

## 2022-01-28 LAB — POCT GLYCOSYLATED HEMOGLOBIN (HGB A1C): HbA1c, POC (controlled diabetic range): 5.6 % (ref 0.0–7.0)

## 2022-01-28 MED ORDER — OZEMPIC (0.25 OR 0.5 MG/DOSE) 2 MG/1.5ML ~~LOC~~ SOPN: PEN_INJECTOR | SUBCUTANEOUS | 2 refills | Status: DC

## 2022-01-28 NOTE — Assessment & Plan Note (Signed)
Blood pressure remains well controlled at this time.  Continue enalapril and Maxide at current strength.

## 2022-01-28 NOTE — Assessment & Plan Note (Signed)
Lab Results  Component Value Date   HGBA1C 5.6 01/28/2022  Blood sugars remain pretty well controlled however she continues to have difficulty with weight loss.  Changing Rybelsus back to Ozempic.

## 2022-01-28 NOTE — Progress Notes (Signed)
Bonnie [name] - 71 y.o. female MRN [id number]  Date of birth: 1951/12/06  Subjective Chief Complaint  Patient presents with   Hypertension   Diabetes    HPI Bonnie [name] is a 71 year old female here today for follow-up visit.  Reports overall Bonnie [name] is doing well at this time.  Has been on Rybelsus for management of diabetes as well as weight management.  Bonnie [name] has not noted any significant weight loss with this.  Bonnie [name] feels that Bonnie [name] did better with Ozempic and would like to restart this.  Darcel Bayley was not covered by her insurance previously.  Blood pressure remains well controlled with enalapril and Maxide.  Tolerating well without side effects.  Denies chest pain, shortness of breath, palpitations, headaches or vision changes.  ROS:  A comprehensive ROS was completed and negative except as noted per HPI  Allergies  Allergen Reactions   Latex Itching and Rash   Metformin And Related Other (See Comments)    Malaise, fatigue   Morphine And Related Rash and Other (See Comments)    Agitation   Tape Swelling and Other (See Comments)    Burning sensation   Tramadol Palpitations    Heart fluttering    Past Medical History:  Diagnosis Date   Abnormal mammogram of right breast 01/20/2018   Anxiety    Benign cyst of right breast 01/26/2018   Blood transfusion without reported diagnosis    Cataract    Centrilobular emphysema ([place]) 07/25/2018   mild   Chronic kidney disease, 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   Chronic low back pain    Colon polyps    Depression    Diabetes mellitus without complication (HCC)    Emphysema of lung (HCC)    Fatty liver    Hypertension    Melanoma ([place])    R upper extremity   Melanoma in situ of right upper extremity (HCC)    Mild diastolic dysfunction 1/75/1025   Osteoarthritis of lumbar spine    Osteoporosis    Renal cancer ([place])    Skin cancer    Sleep apnea 2008   cpap nightly   Sphincter of Oddi dysfunction    bile duct    Past Surgical History:   Procedure Laterality Date   ABDOMINAL HYSTERECTOMY     ANKLE SURGERY Left    in her 71s   BILE DUCT STENT PLACEMENT     CARDIAC CATHETERIZATION     CHOLECYSTECTOMY     COLONOSCOPY     LEFT HEART CATH AND CORONARY ANGIOGRAPHY N/A 09/05/2019   Procedure: LEFT HEART CATH AND CORONARY ANGIOGRAPHY;  Surgeon: Leonie Man, MD;  Location: [facility];  Service: Cardiovascular;  Laterality: N/A;   LUMBAR FUSION     7 level    MOHS SURGERY     x 11   PARTIAL NEPHRECTOMY     POLYPECTOMY     TOTAL HIP ARTHROPLASTY Left 03/01/2020   Procedure: LEFT TOTAL HIP ARTHROPLASTY ANTERIOR APPROACH;  Surgeon: Mcarthur Rossetti, MD;  Location: [facility];  Service: Orthopedics;  Laterality: Left;   UPPER GASTROINTESTINAL ENDOSCOPY      Social History   Socioeconomic History   Marital status: Married    Spouse name: Not on file   Number of children: Not on file   Years of education: Not on file   Highest education level: Not on file  Occupational History   Not on file  Tobacco Use   Smoking status: Former  Packs/day: 1.00    Years: 40.00    Pack years: 40.00    Types: Cigarettes    Quit date: 12/21/2010    Years since quitting: 11.1   Smokeless tobacco: Never  Vaping Use   Vaping Use: Former   Devices: 2012-2014 while quitting cigaretter  Substance and Sexual Activity   Alcohol use: Yes    Alcohol/week: 1.0 - 2.0 standard drink    Types: 1 - 2 Standard drinks or equivalent per week   Drug use: No   Sexual activity: Yes    Birth control/protection: Surgical, None  Other Topics Concern   Not on file  Social History Narrative   Not on file   Social Determinants of Health   Financial Resource Strain: Not on file  Food Insecurity: Not on file  Transportation Needs: Not on file  Physical Activity: Not on file  Stress: Not on file  Social Connections: Not on file    Family History  Problem Relation Age of Onset   Depression Mother    Hyperlipidemia Mother     Hypertension Mother    Colon polyps Mother    Heart attack Father    Hyperlipidemia Father    Hypertension Father    Colon polyps Father    Alcohol abuse Sister    Depression Sister    Alcohol abuse Son    Alcohol abuse Son    Alcohol abuse Sister    Depression Sister    Stomach cancer Brother    Colon cancer Neg Hx    Esophageal cancer Neg Hx    Rectal cancer Neg Hx     Health Maintenance  Topic Date Due   OPHTHALMOLOGY EXAM  08/28/2021   TETANUS/TDAP  04/29/2022 (Originally 04/25/2021)   FOOT EXAM  04/29/2022   HEMOGLOBIN A1C  07/28/2022   COLONOSCOPY (Pts 45-16yrs Insurance coverage will need to be confirmed)  12/07/2022   MAMMOGRAM  06/25/2023   Pneumonia Vaccine 62+ Years old  Completed   INFLUENZA VACCINE  Completed   DEXA SCAN  Completed   COVID-19 Vaccine  Completed   Hepatitis C Screening  Completed   Zoster Vaccines- Shingrix  Completed   HPV VACCINES  Aged Out     ----------------------------------------------------------------------------------------------------------------------------------------------------------------------------------------------------------------- Physical Exam BP 115/75 (BP Location: Left Arm, Patient Position: Sitting, Cuff Size: Large)    [name] (!) 111    Ht 5\' 9"  (1.753 m)    Wt (!) 308 lb (139.7 kg)    SpO2 97%    BMI 45.48 kg/m   Physical Exam Constitutional:      Appearance: Normal appearance.  Eyes:     General: No scleral icterus. Cardiovascular:     Rate and Rhythm: Normal rate and regular rhythm.  Pulmonary:     Effort: Pulmonary effort is normal.     Breath sounds: Normal breath sounds.  Musculoskeletal:     Cervical back: Neck supple.  Neurological:     Mental Status: Bonnie [name] is alert.  Psychiatric:        Mood and Affect: Mood normal.        Behavior: Behavior normal.     ------------------------------------------------------------------------------------------------------------------------------------------------------------------------------------------------------------------- Assessment and Plan  Essential hypertension Blood pressure remains well controlled at this time.  Continue enalapril and Maxide at current strength.  Type 2 diabetes mellitus with hyperglycemia, without long-term current use of insulin (HCC) Lab Results  Component Value Date   HGBA1C 5.6 01/28/2022  Blood sugars remain pretty well controlled however Bonnie [name] continues to have difficulty with weight loss.  Changing Rybelsus back to Ozempic.  Recurrent major depressive disorder, in partial remission ([place]) Continues to do well with current strength of Effexor.   Meds ordered this encounter  Medications   Semaglutide,0.25 or 0.5MG /DOS, (OZEMPIC, 0.25 OR 0.5 MG/DOSE,) 2 MG/1.5ML SOPN    Sig: Take 0.25mg  weekly x1 month then increase to 0.5mg  weekly    Dispense:  3 mL    Refill:  2    Return in about 4 months (around 05/28/2022) for T2DM/HTN.    This visit occ Bonnie [name] back to work because I asked urred during the [employer] health emergency.  Safety protocols were in place, including screening questions prior to the visit, additional usage of staff PPE, and extensive cleaning of exam room while observing appropriate contact time as indicated for disinfecting solutions.

## 2022-01-28 NOTE — Assessment & Plan Note (Signed)
Continues to do well with current strength of Effexor.

## 2022-01-29 ENCOUNTER — Other Ambulatory Visit: Payer: Self-pay | Admitting: Osteopathic Medicine

## 2022-01-29 DIAGNOSIS — F3342 Major depressive disorder, recurrent, in full remission: Secondary | ICD-10-CM

## 2022-02-09 ENCOUNTER — Telehealth: Payer: Self-pay

## 2022-02-09 NOTE — Telephone Encounter (Signed)
Initiated Prior authorization [id number] (0.25 or 0.5 MG/DOSE) 2MG /1.5ML pen-injectors Via: Covermymeds Case/Key: [id number] Status: approved as of 02/09/22 Reason: this request is approved from 02/09/2022 to 02/09/2023. Notified Pt via: Mychart

## 2022-02-23 ENCOUNTER — Other Ambulatory Visit: Payer: Self-pay | Admitting: Osteopathic Medicine

## 2022-03-04 ENCOUNTER — Telehealth: Payer: Self-pay

## 2022-03-04 DIAGNOSIS — F419 Anxiety disorder, unspecified: Secondary | ICD-10-CM

## 2022-03-04 DIAGNOSIS — M47816 Spondylosis without myelopathy or radiculopathy, lumbar region: Secondary | ICD-10-CM

## 2022-03-06 ENCOUNTER — Other Ambulatory Visit: Payer: Self-pay | Admitting: Family Medicine

## 2022-03-06 ENCOUNTER — Encounter: Payer: Self-pay | Admitting: Family Medicine

## 2022-03-06 DIAGNOSIS — M47816 Spondylosis without myelopathy or radiculopathy, lumbar region: Secondary | ICD-10-CM

## 2022-03-06 DIAGNOSIS — F419 Anxiety disorder, unspecified: Secondary | ICD-10-CM

## 2022-03-06 MED ORDER — ALPRAZOLAM 0.25 MG PO TABS: 0.2500 mg | ORAL_TABLET | Freq: Three times a day (TID) | ORAL | 0 refills | Status: DC | PRN

## 2022-03-06 MED ORDER — HYDROCODONE-ACETAMINOPHEN 5-325 MG PO TABS: 1.0000 | ORAL_TABLET | Freq: Three times a day (TID) | ORAL | 0 refills | Status: DC | PRN

## 2022-03-06 NOTE — Telephone Encounter (Signed)
Please apologize to pt. Orders were place but were waiting for signature. Dr. Zigmund Daniel will sign off today. Thanks. ?

## 2022-03-06 NOTE — Telephone Encounter (Signed)
Completed.

## 2022-03-06 NOTE — Telephone Encounter (Signed)
Pt called.  She needs a refill on her Hydrocodone and Alprazolam. She said she left a vm since Turesday and has not heard anything back.   ?

## 2022-03-12 NOTE — Telephone Encounter (Signed)
Medications sent

## 2022-03-24 ENCOUNTER — Other Ambulatory Visit: Payer: Self-pay | Admitting: Family Medicine

## 2022-03-30 ENCOUNTER — Encounter: Payer: Self-pay | Admitting: Family Medicine

## 2022-03-30 DIAGNOSIS — E1165 Type 2 diabetes mellitus with hyperglycemia: Secondary | ICD-10-CM

## 2022-03-30 MED ORDER — RYBELSUS 14 MG PO TABS: 14.0000 mg | ORAL_TABLET | Freq: Every day | ORAL | 3 refills | Status: DC

## 2022-04-03 ENCOUNTER — Telehealth: Payer: Self-pay | Admitting: Family Medicine

## 2022-04-03 DIAGNOSIS — E1165 Type 2 diabetes mellitus with hyperglycemia: Secondary | ICD-10-CM

## 2022-04-03 MED ORDER — RYBELSUS 14 MG PO TABS: 14.0000 mg | ORAL_TABLET | Freq: Every day | ORAL | 3 refills | Status: DC

## 2022-04-03 NOTE — Telephone Encounter (Signed)
Patient called and said her Rybelsus prescription was sent to the wrong pharmacy and was only a 30 day supply. She is requesting that it gets sent to [facility] for a 90 day supply.  ?

## 2022-04-11 ENCOUNTER — Other Ambulatory Visit: Payer: Self-pay | Admitting: Osteopathic Medicine

## 2022-04-13 DIAGNOSIS — L821 Other seborrheic keratosis: Secondary | ICD-10-CM | POA: Diagnosis not present

## 2022-04-13 DIAGNOSIS — Z85828 Personal history of other malignant neoplasm of skin: Secondary | ICD-10-CM | POA: Diagnosis not present

## 2022-04-13 DIAGNOSIS — L814 Other melanin hyperpigmentation: Secondary | ICD-10-CM | POA: Diagnosis not present

## 2022-04-13 DIAGNOSIS — D235 Other benign neoplasm of skin of trunk: Secondary | ICD-10-CM | POA: Diagnosis not present

## 2022-04-13 DIAGNOSIS — Z85820 Personal history of malignant melanoma of skin: Secondary | ICD-10-CM | POA: Diagnosis not present

## 2022-05-13 ENCOUNTER — Ambulatory Visit: Payer: BC Managed Care – PPO | Admitting: Family Medicine

## 2022-05-21 ENCOUNTER — Ambulatory Visit (INDEPENDENT_AMBULATORY_CARE_PROVIDER_SITE_OTHER): Payer: Medicare Other | Admitting: Sports Medicine

## 2022-05-21 ENCOUNTER — Ambulatory Visit: Payer: Self-pay

## 2022-05-21 DIAGNOSIS — M170 Bilateral primary osteoarthritis of knee: Secondary | ICD-10-CM | POA: Diagnosis not present

## 2022-05-21 NOTE — Progress Notes (Signed)
    Procedures performed today:    Procedure: Real-time Ultrasound Guided injection of the left knee Device: Samsung HS60  Verbal informed consent obtained.  Time-out conducted.  Noted no overlying erythema, induration, or other signs of local infection.  Skin prepped in a sterile fashion.  Local anesthesia: Topical Ethyl chloride.  With sterile technique and under real time ultrasound guidance: Noted trace effusion, 1 cc Kenalog 40, 2 cc lidocaine, 2 cc bupivacaine injected easily Completed without difficulty  Advised to call if fevers/chills, erythema, induration, drainage, or persistent bleeding.  Images permanently stored and available for review in PACS.  Impression: Technically successful ultrasound guided injection.   Procedure: Real-time Ultrasound Guided injection of the right knee Device: Samsung HS60  Verbal informed consent obtained.  Time-out conducted.  Noted no overlying erythema, induration, or other signs of local infection.  Skin prepped in a sterile fashion.  Local anesthesia: Topical Ethyl chloride.  With sterile technique and under real time ultrasound guidance: Noted trace effusion, 1 cc Kenalog 40, 2 cc lidocaine, 2 cc bupivacaine injected easily Completed without difficulty  Advised to call if fevers/chills, erythema, induration, drainage, or persistent bleeding.  Images permanently stored and available for review in PACS.  Impression: Technically successful ultrasound guided injection.  Independent interpretation of notes and tests performed by another provider:   None.  Brief History, Exam, Impression, and Recommendations:    Primary osteoarthritis of both knees Known bilateral osteoarthritis, last injected December 2022, recurrence of pain, repeat bilateral injection today. We will also get [name] approved again for viscosupplementation, she has x-ray confirmed osteoarthritis, has failed greater than 6 weeks of conservative treatment including steroid  injections and home therapy, analgesics. Return to start Visco if/when approved.    ___________________________________________ Gwen Her. Dianah Field, M.D., ABFM., CAQSM. Primary Care and [place] Instructor of [place] [facility]

## 2022-05-21 NOTE — Assessment & Plan Note (Addendum)
Known bilateral osteoarthritis, last injected December 2022, recurrence of pain, repeat bilateral injection today. We will also get Bonnie [name] approved again for viscosupplementation, she has x-ray confirmed osteoarthritis, has failed greater than 6 weeks of conservative treatment including steroid injections and home therapy, analgesics. Return to start Visco if/when approved.

## 2022-05-22 ENCOUNTER — Encounter: Payer: Self-pay | Admitting: Family Medicine

## 2022-05-22 ENCOUNTER — Ambulatory Visit: Payer: Medicare Other | Admitting: Family Medicine

## 2022-05-22 VITALS — BP 131/79 | HR 91 | Ht 69.0 in | Wt 305.0 lb

## 2022-05-22 DIAGNOSIS — E1165 Type 2 diabetes mellitus with hyperglycemia: Secondary | ICD-10-CM

## 2022-05-22 DIAGNOSIS — M47816 Spondylosis without myelopathy or radiculopathy, lumbar region: Secondary | ICD-10-CM | POA: Diagnosis not present

## 2022-05-22 DIAGNOSIS — I10 Essential (primary) hypertension: Secondary | ICD-10-CM | POA: Diagnosis not present

## 2022-05-22 DIAGNOSIS — E785 Hyperlipidemia, unspecified: Secondary | ICD-10-CM

## 2022-05-22 DIAGNOSIS — E1169 Type 2 diabetes mellitus with other specified complication: Secondary | ICD-10-CM

## 2022-05-22 DIAGNOSIS — F419 Anxiety disorder, unspecified: Secondary | ICD-10-CM | POA: Diagnosis not present

## 2022-05-22 DIAGNOSIS — F5105 Insomnia due to other mental disorder: Secondary | ICD-10-CM

## 2022-05-22 LAB — POCT GLYCOSYLATED HEMOGLOBIN (HGB A1C): HbA1c, POC (controlled diabetic range): 6.2 % (ref 0.0–7.0)

## 2022-05-22 MED ORDER — HYDROCODONE-ACETAMINOPHEN 5-325 MG PO TABS: 1.0000 | ORAL_TABLET | Freq: Three times a day (TID) | ORAL | 0 refills | Status: DC | PRN

## 2022-05-22 MED ORDER — ALPRAZOLAM 0.25 MG PO TABS: 0.2500 mg | ORAL_TABLET | Freq: Three times a day (TID) | ORAL | 0 refills | Status: DC | PRN

## 2022-05-22 MED ORDER — TETANUS-DIPHTH-ACELL PERTUSSIS 5-2.5-18.5 LF-MCG/0.5 IM SUSP: 0.5000 mL | Freq: Once | INTRAMUSCULAR | 0 refills | Status: AC

## 2022-05-22 NOTE — Patient Instructions (Addendum)
Great to see you today! - Charles A. 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 Backes  lived in [place] during that time

## 2022-05-24 NOTE — Assessment & Plan Note (Addendum)
Blood pressure stable at this time with current medications.  Recommend continuation of current medications.

## 2022-05-24 NOTE — Assessment & Plan Note (Signed)
Has active prescription for alprazolam.  She rarely uses this.  She is aware she should not use this with her hydrocodone.

## 2022-05-24 NOTE — Progress Notes (Signed)
Bonnie [name] - 71 y.o. female MRN [id number]  Date of birth: [birth year]  Subjective Chief Complaint  Patient presents with   Hypertension   Diabetes    HPI Bonnie [name] Pulse is a 71 year old female here today for follow-up visit.  She reports overall she is feeling well.  She is planning on leaving for a trip traveling to [place] over the next month.  She will be catching up with family and friends there.  Diabetes is currently managed with Rybelsus.  Thought she was having some side effects from this and briefly discontinued but is restarted.  Overall she is tolerating this well.  Blood sugars at home have been improving.  Blood pressure has been well controlled with Maxide and enalapril.  Tolerating this well without side effects.  She has not had chest pain, shortness of breath, palpitations, headaches or vision changes.  She is on chronic hydrocodone for management of osteoarthritis of the hip low back pain.  This continues to work pretty well for her.  She has not had any side effects from this.  Additionally she does have a prescription for alprazolam which she uses very rarely.  She does not to take this with her hydrocodone.  ROS:  A comprehensive ROS was completed and negative except as noted per HPI  Allergies  Allergen Reactions   Latex Itching and Rash   Metformin And Related Other (See Comments)    Malaise, fatigue   Morphine And Related Rash and Other (See Comments)    Agitation   Tape Swelling and Other (See Comments)    Burning sensation   Tramadol Palpitations    Heart fluttering    Past Medical History:  Diagnosis Date   Abnormal mammogram of right breast 01/20/2018   Anxiety    Benign cyst of right breast 01/26/2018   Blood transfusion without reported diagnosis    Cataract    Centrilobular emphysema ([place]) 07/25/2018   mild   Chronic kidney disease, 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   Chronic low back pain    Colon polyps    Depression    Diabetes mellitus  without complication (HCC)    Emphysema of lung (HCC)    Fatty liver    Hypertension    Melanoma ([place])    R upper extremity   Melanoma in situ of right upper extremity (HCC)    Mild diastolic dysfunction 6/76/7209   Osteoarthritis of lumbar spine    Osteoporosis    Renal cancer ([place])    Skin cancer    Sleep apnea 2008   cpap nightly   Sphincter of Oddi dysfunction    bile duct    Past Surgical History:  Procedure Laterality Date   ABDOMINAL HYSTERECTOMY     ANKLE SURGERY Left    in her 68s   BILE DUCT STENT PLACEMENT     CARDIAC CATHETERIZATION     CHOLECYSTECTOMY     COLONOSCOPY     LEFT HEART CATH AND CORONARY ANGIOGRAPHY N/A 09/05/2019   Procedure: LEFT HEART CATH AND CORONARY ANGIOGRAPHY;  Surgeon: Leonie Man, MD;  Location: [facility];  Service: Cardiovascular;  Laterality: N/A;   LUMBAR FUSION     7 level    MOHS SURGERY     x 11   PARTIAL NEPHRECTOMY     POLYPECTOMY     TOTAL HIP ARTHROPLASTY Left 03/01/2020   Procedure: LEFT TOTAL HIP ARTHROPLASTY ANTERIOR APPROACH;  Surgeon: Mcarthur Rossetti, MD;  Location: [facility];  Service: Orthopedics;  Laterality: Left;   UPPER GASTROINTESTINAL ENDOSCOPY      Social History   Socioeconomic History   Marital status: Married    Spouse name: Not on file   Number of children: Not on file   Years of education: Not on file   Highest education level: Not on file  Occupational History   Not on file  Tobacco Use   Smoking status: Former    Packs/day: 1.00    Years: 40.00    Pack years: 40.00    Types: Cigarettes    Quit date: 12/21/2010    Years since quitting: 11.4   Smokeless tobacco: Never  Vaping Use   Vaping Use: Former   Devices: 2012-2014 while quitting cigaretter  Substance and Sexual Activity   Alcohol use: Yes    Alcohol/week: 1.0 - 2.0 standard drink    Types: 1 - 2 Standard drinks or equivalent per week   Drug use: No   Sexual activity: Yes    Birth control/protection: Surgical, None   Other Topics Concern   Not on file  Social History Narrative   Not on file   Social Determinants of Health   Financial Resource Strain: Not on file  Food Insecurity: Not on file  Transportation Needs: Not on file  Physical Activity: Not on file  Stress: Not on file  Social Connections: Not on file    Family History  Problem Relation Age of Onset   Depression Mother    Hyperlipidemia Mother    Hypertension Mother    Colon polyps Mother    Heart attack Father    Hyperlipidemia Father    Hypertension Father    Colon polyps Father    Alcohol abuse Sister    Depression Sister    Alcohol abuse Son    Alcohol abuse Son    Alcohol abuse Sister    Depression Sister    Stomach cancer Brother    Colon cancer Neg Hx    Esophageal cancer Neg Hx    Rectal cancer Neg Hx     Health Maintenance  Topic Date Due   OPHTHALMOLOGY EXAM  08/28/2021   TETANUS/TDAP  05/23/2023 (Originally 04/25/2021)   INFLUENZA VACCINE  07/21/2022   HEMOGLOBIN A1C  11/21/2022   COLONOSCOPY (Pts 45-62yr Insurance coverage will need to be confirmed)  12/07/2022   FOOT EXAM  05/23/2023   MAMMOGRAM  06/25/2023   Pneumonia Vaccine 71 Years old  Completed   DEXA SCAN  Completed   COVID-19 Vaccine  Completed   Hepatitis C Screening  Completed   Zoster Vaccines- Shingrix  Completed   HPV VACCINES  Aged Out     ----------------------------------------------------------------------------------------------------------------------------------------------------------------------------------------------------------------- Physical Exam BP 131/79 (BP Location: Right Arm, Patient Position: Sitting, Cuff Size: Large)   Pulse 91   Ht '5\' 9"'$  (1.753 m)   Wt (!) 305 lb (138.3 kg)   SpO2 96%   BMI 45.04 kg/m   Physical Exam Constitutional:      Appearance: Normal appearance.  Eyes:     General: No scleral icterus. Cardiovascular:     Rate and Rhythm: Normal rate and regular rhythm.  Pulmonary:     Effort:  Pulmonary effort is normal.     Breath sounds: Normal breath sounds.  Musculoskeletal:     Cervical back: Neck supple.  Neurological:     Mental Status: She is alert.  Psychiatric:        Mood and Affect: Mood normal.  Behavior: Behavior normal.    ------------------------------------------------------------------------------------------------------------------------------------------------------------------------------------------------------------------- Assessment and Plan  Essential hypertension Blood pressure stable at this time with current medications.  Recommend continuation of current medications.  Dyslipidemia associated with type 2 diabetes mellitus (HCC) Continue atorvastatin at current strength.  Type 2 diabetes mellitus with hyperglycemia, without long-term current use of insulin (HCC) Blood sugars have improved quite a bit.  Recommend continuation of Rybelsus at current strength.  Spondylosis of lumbar region without myelopathy or radiculopathy Currently has prescription for chronic Norco for management of her hip and back pain.  We will continue at current strength.  Anxiety Has active prescription for alprazolam.  She rarely uses this.  She is aware she should not use this with her hydrocodone.   Meds ordered this encounter  Medications   HYDROcodone-acetaminophen (NORCO/VICODIN) 5-325 MG tablet    Sig: Take 1 tablet by mouth every 8 (eight) hours as needed for moderate pain or severe pain.    Dispense:  60 tablet    Refill:  0   ALPRAZolam (XANAX) 0.25 MG tablet    Sig: Take 1-2 tablets (0.25-0.5 mg total) by mouth 3 (three) times daily as needed for anxiety or sleep.    Dispense:  30 tablet    Refill:  0    Not to exceed 5 additional fills before 12/28/2021   Tdap (BOOSTRIX) 5-2.5-18.5 LF-MCG/0.5 injection    Sig: Inject 0.5 mLs into the muscle once for 1 dose.    Dispense:  0.5 mL    Refill:  0    Return in about 3 months (around 08/22/2022) for  HTN/T2DM.    This visit occurred during the SARS-CoV-2 public health emergency.  Safety protocols were in place, including screening questions prior to the visit, additional usage of staff PPE, and extensive cleaning of exam room while observing appropriate contact time as indicated for disinfecting solutions.

## 2022-05-24 NOTE — Assessment & Plan Note (Signed)
Currently has prescription for chronic Norco for management of her hip and back pain.  We will continue at current strength.

## 2022-05-24 NOTE — Assessment & Plan Note (Signed)
Continue atorvastatin at current strength.  

## 2022-05-24 NOTE — Assessment & Plan Note (Signed)
Blood sugars have improved quite a bit.  Recommend continuation of Rybelsus at current strength.

## 2022-05-25 ENCOUNTER — Encounter: Payer: Self-pay | Admitting: Family Medicine

## 2022-07-06 ENCOUNTER — Other Ambulatory Visit: Payer: Self-pay | Admitting: Family Medicine

## 2022-07-06 DIAGNOSIS — Z1231 Encounter for screening mammogram for malignant neoplasm of breast: Secondary | ICD-10-CM

## 2022-07-22 ENCOUNTER — Ambulatory Visit (INDEPENDENT_AMBULATORY_CARE_PROVIDER_SITE_OTHER): Payer: Medicare Other

## 2022-07-22 ENCOUNTER — Ambulatory Visit
Admission: RE | Admit: 2022-07-22 | Discharge: 2022-07-22 | Disposition: A | Discharge status: Home / Self Care | Payer: Medicare Other | Source: Ambulatory Visit | Attending: Family Medicine | Admitting: Family Medicine

## 2022-07-22 ENCOUNTER — Ambulatory Visit (INDEPENDENT_AMBULATORY_CARE_PROVIDER_SITE_OTHER): Payer: Medicare Other | Admitting: Orthopaedic Surgery

## 2022-07-22 ENCOUNTER — Encounter: Payer: Self-pay | Admitting: Orthopaedic Surgery

## 2022-07-22 VITALS — Wt 307.0 lb

## 2022-07-22 DIAGNOSIS — Z96642 Presence of left artificial hip joint: Secondary | ICD-10-CM

## 2022-07-22 DIAGNOSIS — Z1231 Encounter for screening mammogram for malignant neoplasm of breast: Secondary | ICD-10-CM

## 2022-07-22 NOTE — Progress Notes (Signed)
The patient is a very pleasant 71 year old female who had a left anterior total hip arthroplasty in March 2021.  She has done well for a long period time but recently had a 5-hour car trip back from [place] and has had some hip pain since then.  She says it is feeling better but she was having some lateral hip pain and groin pain on that left side.  She got back from her trip at the end of June.  She is walking without assistive device.  She has gained weight since we saw her last and her BMI is 45.  Her left operative hip moves smoothly and fluidly with a little bit of pain in the groin and the limited pain of the trochanteric area.  She is not walking with a limp.  An AP pelvis and lateral of her left hip shows a well-seated bone ingrown total hip arthroplasty no evidence of loosening or complicating features.  At this point she can try Voltaren gel if needed.  Also other anti-inflammatories are as needed.  I think weight loss will help.  I gave her reassurance that I do not see thing going on with the hip replacement itself.  If things worsen anyway she knows to let us know.  Follow-up is as needed.

## 2022-08-11 ENCOUNTER — Encounter: Payer: Self-pay | Admitting: Family Medicine

## 2022-08-11 DIAGNOSIS — E1165 Type 2 diabetes mellitus with hyperglycemia: Secondary | ICD-10-CM

## 2022-08-11 DIAGNOSIS — F3342 Major depressive disorder, recurrent, in full remission: Secondary | ICD-10-CM

## 2022-08-11 DIAGNOSIS — I10 Essential (primary) hypertension: Secondary | ICD-10-CM

## 2022-08-11 DIAGNOSIS — E1169 Type 2 diabetes mellitus with other specified complication: Secondary | ICD-10-CM

## 2022-08-12 ENCOUNTER — Encounter: Payer: Self-pay | Admitting: General Practice

## 2022-08-14 MED ORDER — ATORVASTATIN CALCIUM 40 MG PO TABS: 40.0000 mg | ORAL_TABLET | Freq: Every day | ORAL | 0 refills | Status: DC

## 2022-08-14 MED ORDER — TRAZODONE HCL 50 MG PO TABS: 50.0000 mg | ORAL_TABLET | Freq: Every day | ORAL | 0 refills | Status: DC

## 2022-08-14 MED ORDER — ENALAPRIL MALEATE 10 MG PO TABS: 10.0000 mg | ORAL_TABLET | Freq: Every day | ORAL | 0 refills | Status: DC

## 2022-08-23 ENCOUNTER — Encounter: Payer: Self-pay | Admitting: Sports Medicine

## 2022-08-25 ENCOUNTER — Ambulatory Visit (INDEPENDENT_AMBULATORY_CARE_PROVIDER_SITE_OTHER): Payer: Medicare Other | Admitting: Family Medicine

## 2022-08-25 ENCOUNTER — Encounter: Payer: Self-pay | Admitting: Family Medicine

## 2022-08-25 VITALS — BP 133/79 | HR 95 | Ht 69.0 in | Wt 307.0 lb

## 2022-08-25 DIAGNOSIS — F418 Other specified anxiety disorders: Secondary | ICD-10-CM

## 2022-08-25 DIAGNOSIS — E1165 Type 2 diabetes mellitus with hyperglycemia: Secondary | ICD-10-CM

## 2022-08-25 DIAGNOSIS — I10 Essential (primary) hypertension: Secondary | ICD-10-CM | POA: Diagnosis not present

## 2022-08-25 DIAGNOSIS — E785 Hyperlipidemia, unspecified: Secondary | ICD-10-CM

## 2022-08-25 DIAGNOSIS — M47816 Spondylosis without myelopathy or radiculopathy, lumbar region: Secondary | ICD-10-CM

## 2022-08-25 DIAGNOSIS — F3341 Major depressive disorder, recurrent, in partial remission: Secondary | ICD-10-CM

## 2022-08-25 DIAGNOSIS — E1169 Type 2 diabetes mellitus with other specified complication: Secondary | ICD-10-CM

## 2022-08-25 LAB — POCT UA - MICROALBUMIN
Albumin/Creatinine Ratio, Urine, POC: <30
Creatinine, POC: 200 mg/dL
Microalbumin Ur, POC: 30 mg/L

## 2022-08-25 LAB — POCT GLYCOSYLATED HEMOGLOBIN (HGB A1C): HbA1c, POC (prediabetic range): 5.6 % — AB (ref 5.7–6.4)

## 2022-08-25 MED ORDER — TIRZEPATIDE 5 MG/0.5ML ~~LOC~~ SOAJ: 5.0000 mg | SUBCUTANEOUS | 0 refills | Status: DC

## 2022-08-25 MED ORDER — HYDROCODONE-ACETAMINOPHEN 5-325 MG PO TABS: 1.0000 | ORAL_TABLET | Freq: Three times a day (TID) | ORAL | 0 refills | Status: DC | PRN

## 2022-08-25 MED ORDER — TIRZEPATIDE 7.5 MG/0.5ML ~~LOC~~ SOAJ: 7.5000 mg | SUBCUTANEOUS | 0 refills | Status: DC

## 2022-08-25 NOTE — Telephone Encounter (Signed)
Actually I think it was and I forgot to send you a message, I have apologized to the patient, would you please work on bilateral Orthovisc approval?

## 2022-08-25 NOTE — Assessment & Plan Note (Signed)
Multiple stressors at this time.  Continue Effexor and bupropion.  Referral placed to psychology for counseling.

## 2022-08-25 NOTE — Assessment & Plan Note (Signed)
Continue atorvastatin at current strength.  

## 2022-08-25 NOTE — Patient Instructions (Addendum)
Let's try mounjaro to replace Rybelsus.   See me again in about 4 months.

## 2022-08-25 NOTE — Assessment & Plan Note (Signed)
Continues on Norco as needed.  PDMP reviewed today.

## 2022-08-25 NOTE — Progress Notes (Signed)
Bonnie [name] - 71 y.o. female MRN [id number]  Date of birth: September 02, 1951  Subjective Chief Complaint  Patient presents with   Diabetes   Hypertension    HPI Bonnie [name] is a 71 year old female here today for follow-up visit.  Recently returned from trip to [place] to see family.  Overall trip was pretty good however she does have some stressors related to her family including alcohol use disorder with her son.  She does report that her daughter has been, engaged to get married.  She is excited however does have some reservations about her weight.  She does feel that Effexor and bupropion continues to work pretty well for her.  She remains on Rybelsus for management of her diabetes.  She does feel like this is helped with her sugars however has not noticed any effect on her weight and appetite.  She did not tolerate Ozempic very well.  She would be interested in trying [facility].  Blood pressure remains well controlled with enalapril and Maxide. .  No side effects with current medications.  Denies chest pain, shortness of breath, palpitations, headaches or vision changes.  She does have Norco as needed for back pain.  She does not use this very often.  ROS:  A comprehensive ROS was completed and negative except as noted per HPI   Allergies  Allergen Reactions   Latex Itching and Rash   Metformin And Related Other (See Comments)    Malaise, fatigue   Morphine And Related Rash and Other (See Comments)    Agitation   Tape Swelling and Other (See Comments)    Burning sensation   Tramadol Palpitations    Heart fluttering    Past Medical History:  Diagnosis Date   Abnormal mammogram of right breast 01/20/2018   Anxiety    Benign cyst of right breast 01/26/2018   Blood transfusion without reported diagnosis    Cataract    Centrilobular emphysema ([place]) 07/25/2018   mild   Chronic kidney disease, stage 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   Chronic low back pain    Colon polyps    Depression     Diabetes mellitus without complication (HCC)    Emphysema of lung (HCC)    Fatty liver    Hypertension    Melanoma ([place])    R upper extremity   Melanoma in situ of right upper extremity (HCC)    Mild diastolic dysfunction 7/82/9562   Osteoarthritis of lumbar spine    Osteoporosis    Renal cancer ([place])    Skin cancer    Sleep apnea 2008   cpap nightly   Sphincter of Oddi dysfunction    bile duct    Past Surgical History:  Procedure Laterality Date   ABDOMINAL HYSTERECTOMY     ANKLE SURGERY Left    in her 84s   BILE DUCT STENT PLACEMENT     CARDIAC CATHETERIZATION     CHOLECYSTECTOMY     COLONOSCOPY     LEFT HEART CATH AND CORONARY ANGIOGRAPHY N/A 09/05/2019   Procedure: LEFT HEART CATH AND CORONARY ANGIOGRAPHY;  Surgeon: Leonie Man, MD;  Location: [facility];  Service: Cardiovascular;  Laterality: N/A;   LUMBAR FUSION     7 level    MOHS SURGERY     x 11   PARTIAL NEPHRECTOMY     POLYPECTOMY     TOTAL HIP ARTHROPLASTY Left 03/01/2020   Procedure: LEFT TOTAL HIP ARTHROPLASTY ANTERIOR APPROACH;  Surgeon: Mcarthur Rossetti,  MD;  Location: [facility];  Service: Orthopedics;  Laterality: Left;   UPPER GASTROINTESTINAL ENDOSCOPY      Social History   Socioeconomic History   Marital status: Married    Spouse name: Not on file   Number of children: Not on file   Years of education: Not on file   Highest education level: Not on file  Occupational History   Not on file  Tobacco Use   Smoking status: Former    Packs/day: 1.00    Years: 40.00    Total pack years: 40.00    Types: Cigarettes    Quit date: 12/21/2010    Years since quitting: 11.6   Smokeless tobacco: Never  Vaping Use   Vaping Use: Former   Devices: 2012-2014 while quitting cigaretter  Substance and Sexual Activity   Alcohol use: Yes    Alcohol/week: 1.0 - 2.0 standard drink of alcohol    Types: 1 - 2 Standard drinks or equivalent per week   Drug use: No   Sexual activity: Yes     Birth control/protection: Surgical, None  Other Topics Concern   Not on file  Social History Narrative   Not on file   Social Determinants of Health   Financial Resource Strain: Not on file  Food Insecurity: Not on file  Transportation Needs: Not on file  Physical Activity: Not on file  Stress: Not on file  Social Connections: Not on file    Family History  Problem Relation Age of Onset   Depression Mother    Hyperlipidemia Mother    Hypertension Mother    Colon polyps Mother    Heart attack Father    Hyperlipidemia Father    Hypertension Father    Colon polyps Father    Alcohol abuse Sister    Depression Sister    Alcohol abuse Son    Alcohol abuse Son    Alcohol abuse Sister    Depression Sister    Stomach cancer Brother    Colon cancer Neg Hx    Esophageal cancer Neg Hx    Rectal cancer Neg Hx     Health Maintenance  Topic Date Due   COVID-19 Vaccine (5 - Moderna risk series) 01/21/2023 (Originally 11/18/2021)   INFLUENZA VACCINE  03/21/2023 (Originally 07/21/2022)   Diabetic kidney evaluation - GFR measurement  10/28/2022   OPHTHALMOLOGY EXAM  12/01/2022   COLONOSCOPY (Pts 45-67yr Insurance coverage will need to be confirmed)  12/07/2022   HEMOGLOBIN A1C  02/23/2023   FOOT EXAM  05/23/2023   Diabetic kidney evaluation - Urine ACR  08/26/2023   MAMMOGRAM  07/22/2024   TETANUS/TDAP  05/22/2032   Pneumonia Vaccine 71 Years old  Completed   DEXA SCAN  Completed   Hepatitis C Screening  Completed   Zoster Vaccines- Shingrix  Completed   HPV VACCINES  Aged Out     ----------------------------------------------------------------------------------------------------------------------------------------------------------------------------------------------------------------- Physical Exam BP 133/79 (BP Location: Left Wrist, Patient Position: Sitting, Cuff Size: Large)   [name] 95   Ht '5\' 9"'$  (1.753 m)   Wt (!) 307 lb (139.3 kg)   SpO2 95%   BMI 45.34 kg/m    Physical Exam Constitutional:      Appearance: Normal appearance.  Eyes:     General: No scleral icterus. Cardiovascular:     Rate and Rhythm: Normal rate and regular rhythm.  Pulmonary:     Effort: Pulmonary effort is normal.     Breath sounds: Normal breath sounds.  Musculoskeletal:  Cervical back: Neck supple.  Neurological:     General: No focal deficit present.     Mental Status: She is alert.  Psychiatric:        Mood and Affect: Mood normal.        Behavior: Behavior normal.     ------------------------------------------------------------------------------------------------------------------------------------------------------------------------------------------------------------------- Assessment and Plan  Essential hypertension Blood pressure mains well controlled at this time.  Recommend continuation of current medications for management of hypertension.  Type 2 diabetes mellitus with hyperglycemia, without long-term current use of insulin (HCC) Lab Results  Component Value Date   HGBA1C 5.6 (A) 08/25/2022  Blood sugars are fairly well controlled with Rybelsus.  I think she would have added benefit with change to [facility].  Starting at 5 mg we will transition to 7.5 mg after 1 month.  Dyslipidemia associated with type 2 diabetes mellitus (HCC) Continue atorvastatin at current strength.  Recurrent major depressive disorder, in partial remission ([place]) Multiple stressors at this time.  Continue Effexor and bupropion.  Referral placed to psychology for counseling.  Spondylosis of lumbar region without myelopathy or radiculopathy Continues on Norco as needed.  PDMP reviewed today.   Meds ordered this encounter  Medications   tirzepatide (MOUNJARO) 5 MG/0.5ML Pen    Sig: Inject 5 mg into the skin once a week. Increase to 7.'5mg'$  after 1 month    Dispense:  3 mL    Refill:  0   tirzepatide (MOUNJARO) 7.5 MG/0.5ML Pen    Sig: Inject 7.5 mg into the skin once a  week.    Dispense:  6 mL    Refill:  0   HYDROcodone-acetaminophen (NORCO/VICODIN) 5-325 MG tablet    Sig: Take 1 tablet by mouth every 8 (eight) hours as needed for moderate pain or severe pain.    Dispense:  60 tablet    Refill:  0    Return in about 4 months (around 12/25/2022) for T2DM.    This visit occurred during the SARS-CoV-2 public health emergency.  Safety protocols were in place, including screening questions prior to the visit, additional usage of staff PPE, and extensive cleaning of exam room while observing appropriate contact time as indicated for disinfecting solutions.

## 2022-08-25 NOTE — Assessment & Plan Note (Signed)
Lab Results  Component Value Date   HGBA1C 5.6 (A) 08/25/2022  Blood sugars are fairly well controlled with Rybelsus.  I think she would have added benefit with change to [facility].  Starting at 5 mg we will transition to 7.5 mg after 1 month.

## 2022-08-25 NOTE — Assessment & Plan Note (Signed)
Blood pressure mains well controlled at this time.  Recommend continuation of current medications for management of hypertension.

## 2022-08-26 ENCOUNTER — Telehealth: Payer: Self-pay

## 2022-08-26 NOTE — Telephone Encounter (Signed)
Actually I think it was and I forgot to send you a message, I have apologized to the patient, would you please work on bilateral Orthovisc approval?

## 2022-08-26 NOTE — Telephone Encounter (Signed)
MyVisco paperwork faxed to [employer] at [phone] Request is for Orthovisc Pt's insurance prefers Orthovisc Fax confirmation receipt received  

## 2022-08-27 NOTE — Telephone Encounter (Signed)
[employer] called requesting a copy of patient's MCR card. Faxed and confirmation rec'd.

## 2022-08-28 ENCOUNTER — Encounter: Payer: Self-pay | Admitting: Family Medicine

## 2022-08-28 ENCOUNTER — Other Ambulatory Visit: Payer: Self-pay | Admitting: Family Medicine

## 2022-08-31 MED ORDER — OZEMPIC (0.25 OR 0.5 MG/DOSE) 2 MG/3ML ~~LOC~~ SOPN: PEN_INJECTOR | SUBCUTANEOUS | 0 refills | Status: DC

## 2022-08-31 MED ORDER — SEMAGLUTIDE (1 MG/DOSE) 4 MG/3ML ~~LOC~~ SOPN: 1.0000 mg | PEN_INJECTOR | SUBCUTANEOUS | 1 refills | Status: DC

## 2022-09-10 NOTE — Telephone Encounter (Signed)
Benefits Investigation Details received from [employer] Injection: Orthovisc  Medical: PRIMARY: Deductible does apply. Once the deductible has been met, pt is responsible for a coinsurance. SECONDARY: Plans follow Medicare guidelines. It will pick up the remaining eligible expenses at 100%. It does cover the [facility] Part B deductible.  PA required: No Pharmacy: Product not covered under pharmacy plan.  Specialty Pharmacy:   May fill through: Buy and Bill OV Copay/Coinsurance: 20% ($28) Product Copay: 20% ($280) Administration Coinsurance: 20% ($26) Administration Copay:  Deductible: $226 (met: $118.20) Out of Pocket Max: $ (met: $)    Patient aware of approx cost and call was transferred to the front desk for scheduling.

## 2022-09-15 ENCOUNTER — Ambulatory Visit (INDEPENDENT_AMBULATORY_CARE_PROVIDER_SITE_OTHER): Payer: Medicare Other | Admitting: Sports Medicine

## 2022-09-15 ENCOUNTER — Ambulatory Visit (INDEPENDENT_AMBULATORY_CARE_PROVIDER_SITE_OTHER): Payer: Medicare Other

## 2022-09-15 DIAGNOSIS — M47816 Spondylosis without myelopathy or radiculopathy, lumbar region: Secondary | ICD-10-CM | POA: Diagnosis not present

## 2022-09-15 DIAGNOSIS — M170 Bilateral primary osteoarthritis of knee: Secondary | ICD-10-CM

## 2022-09-15 NOTE — Progress Notes (Signed)
    Procedures performed today:    Procedure: Real-time Ultrasound Guided injection of the left knee Device: Samsung HS60  Verbal informed consent obtained.  Time-out conducted.  Noted no overlying erythema, induration, or other signs of local infection.  Skin prepped in a sterile fashion.  Local anesthesia: Topical Ethyl chloride.  With sterile technique and under real time ultrasound guidance: 30 mg/2 mL of OrthoVisc (sodium hyaluronate) in a prefilled syringe was injected easily into the knee through a 22-gauge needle. Completed without difficulty  Advised to call if fevers/chills, erythema, induration, drainage, or persistent bleeding.  Images permanently stored and available for review in PACS.  Impression: Technically successful ultrasound guided injection.  Procedure: Real-time Ultrasound Guided injection of the right knee Device: Samsung HS60  Verbal informed consent obtained.  Time-out conducted.  Noted no overlying erythema, induration, or other signs of local infection.  Skin prepped in a sterile fashion.  Local anesthesia: Topical Ethyl chloride.  With sterile technique and under real time ultrasound guidance: 30 mg/2 mL of OrthoVisc (sodium hyaluronate) in a prefilled syringe was injected easily into the knee through a 22-gauge needle. Completed without difficulty  Advised to call if fevers/chills, erythema, induration, drainage, or persistent bleeding.  Images permanently stored and available for review in PACS.  Impression: Technically successful ultrasound guided injection.  Independent interpretation of notes and tests performed by another provider:   Lumbar spine MRI personally reviewed from 2019, noted artifact from the fusion construct, there is also a large disc protrusion L5-S1.  Brief History, Exam, Impression, and Recommendations:    Primary osteoarthritis of both knees Known bilateral knee osteoarthritis, today we started Orthovisc series #1 of 4, return  to see me in 1 week for #2 of 4 bilateral.  Spondylosis of lumbar region without myelopathy or radiculopathy Continues on Norco with PCP, she did not respond well to the radiofrequency ablation of L4-S1 facet joints in [place]. I did personally review the MRI that does show an L5-S1 disc protrusion, we will target this with an L5-S1 interlaminar epidural with [place] imaging.     ____________________________________________ Gwen Her. Dianah Field, M.D., ABFM., CAQSM., AME. Primary Care and Sports Medicine [facility]  Adjunct Professor of [place] of [facility] of Medicine  Risk manager

## 2022-09-15 NOTE — Assessment & Plan Note (Signed)
Continues on Norco with PCP, she did not respond well to the radiofrequency ablation of L4-S1 facet joints in [place]. I did personally review the MRI that does show an L5-S1 disc protrusion, we will target this with an L5-S1 interlaminar epidural with [facility] imaging.

## 2022-09-15 NOTE — Assessment & Plan Note (Addendum)
Known bilateral knee osteoarthritis, today we started Orthovisc series #1 of 4, return to see me in 1 week for #2 of 4 bilateral.

## 2022-09-22 ENCOUNTER — Ambulatory Visit (INDEPENDENT_AMBULATORY_CARE_PROVIDER_SITE_OTHER): Payer: Medicare Other

## 2022-09-22 ENCOUNTER — Ambulatory Visit (INDEPENDENT_AMBULATORY_CARE_PROVIDER_SITE_OTHER): Payer: Medicare Other | Admitting: Sports Medicine

## 2022-09-22 ENCOUNTER — Encounter: Payer: Self-pay | Admitting: Sports Medicine

## 2022-09-22 DIAGNOSIS — M170 Bilateral primary osteoarthritis of knee: Secondary | ICD-10-CM

## 2022-09-22 DIAGNOSIS — R1032 Left lower quadrant pain: Secondary | ICD-10-CM

## 2022-09-22 NOTE — Progress Notes (Signed)
    Procedures performed today:    None.  Independent interpretation of notes and tests performed by another provider:   Procedure: Real-time Ultrasound Guided injection of the left knee Device: Samsung HS60  Verbal informed consent obtained.  Time-out conducted.  Noted no overlying erythema, induration, or other signs of local infection.  Skin prepped in a sterile fashion.  Local anesthesia: Topical Ethyl chloride.  With sterile technique and under real time ultrasound guidance: 30 mg/2 mL of OrthoVisc (sodium hyaluronate) in a prefilled syringe was injected easily into the knee through a 22-gauge needle. Completed without difficulty  Advised to call if fevers/chills, erythema, induration, drainage, or persistent bleeding.  Images permanently stored and available for review in PACS.  Impression: Technically successful ultrasound guided injection.   Procedure: Real-time Ultrasound Guided injection of the right knee Device: Samsung HS60  Verbal informed consent obtained.  Time-out conducted.  Noted no overlying erythema, induration, or other signs of local infection.  Skin prepped in a sterile fashion.  Local anesthesia: Topical Ethyl chloride.  With sterile technique and under real time ultrasound guidance: 30 mg/2 mL of OrthoVisc (sodium hyaluronate) in a prefilled syringe was injected easily into the knee through a 22-gauge needle. Completed without difficulty  Advised to call if fevers/chills, erythema, induration, drainage, or persistent bleeding.  Images permanently stored and available for review in PACS.  Impression: Technically successful ultrasound guided injection.  Brief History, Exam, Impression, and Recommendations:    Primary osteoarthritis of both knees Orthovisc 2 of 4 bilateral, return in 1 week for #3 of 4.  Left groin pain This pleasant 71 year old female has also had about 3 months of pain left groin, after a lot of walking. She had an x-ray with her  orthopedic surgeon recently that showed no abnormalities in the hip arthroplasty. On exam she has reproduction of pain with resisted hip flexion. No pain with internal rotation and she is post hip arthroplasty, initial suspicion is hip flexor tendinitis, we will give further rehab and if insufficient improvement we will need an MRI for suspicion of pubic bone stress injury.    ____________________________________________ Gwen Her. Dianah Field, M.D., ABFM., CAQSM., AME. Primary Care and Sports Medicine [facility]  Adjunct Professor of [place] of [facility] of Medicine  Risk manager

## 2022-09-22 NOTE — Assessment & Plan Note (Signed)
This pleasant 71 year old female has also had about 3 months of pain left groin, after a lot of walking. She had an x-ray with her orthopedic surgeon recently that showed no abnormalities in the hip arthroplasty. On exam she has reproduction of pain with resisted hip flexion. No pain with internal rotation and she is post hip arthroplasty, initial suspicion is hip flexor tendinitis, we will give further rehab and if insufficient improvement we will need an MRI for suspicion of pubic bone stress injury.

## 2022-09-22 NOTE — Assessment & Plan Note (Signed)
Orthovisc 2 of 4 bilateral, return in 1 week for #3 of 4.

## 2022-09-23 ENCOUNTER — Ambulatory Visit
Admission: RE | Admit: 2022-09-23 | Discharge: 2022-09-23 | Disposition: A | Discharge status: Home / Self Care | Payer: Medicare Other | Source: Ambulatory Visit | Attending: Sports Medicine | Admitting: Sports Medicine

## 2022-09-23 DIAGNOSIS — M47816 Spondylosis without myelopathy or radiculopathy, lumbar region: Secondary | ICD-10-CM

## 2022-09-23 MED ORDER — METHYLPREDNISOLONE ACETATE 40 MG/ML INJ SUSP (RADIOLOG
80.0000 mg | Freq: Once | INTRAMUSCULAR | Status: AC
  Administered 2022-09-23: 80 mg via EPIDURAL

## 2022-09-23 MED ORDER — IOPAMIDOL (ISOVUE-M 200) INJECTION 41%
1.0000 mL | Freq: Once | INTRAMUSCULAR | Status: AC
  Administered 2022-09-23: 1 mL via EPIDURAL

## 2022-09-23 NOTE — Discharge Instructions (Signed)

## 2022-09-29 ENCOUNTER — Ambulatory Visit (INDEPENDENT_AMBULATORY_CARE_PROVIDER_SITE_OTHER): Payer: Medicare Other

## 2022-09-29 ENCOUNTER — Other Ambulatory Visit: Payer: Self-pay

## 2022-09-29 ENCOUNTER — Ambulatory Visit (INDEPENDENT_AMBULATORY_CARE_PROVIDER_SITE_OTHER): Payer: Medicare Other | Admitting: Sports Medicine

## 2022-09-29 DIAGNOSIS — F419 Anxiety disorder, unspecified: Secondary | ICD-10-CM

## 2022-09-29 DIAGNOSIS — M170 Bilateral primary osteoarthritis of knee: Secondary | ICD-10-CM

## 2022-09-29 DIAGNOSIS — E1169 Type 2 diabetes mellitus with other specified complication: Secondary | ICD-10-CM

## 2022-09-29 DIAGNOSIS — E1165 Type 2 diabetes mellitus with hyperglycemia: Secondary | ICD-10-CM

## 2022-09-29 DIAGNOSIS — I10 Essential (primary) hypertension: Secondary | ICD-10-CM

## 2022-09-29 DIAGNOSIS — F331 Major depressive disorder, recurrent, moderate: Secondary | ICD-10-CM

## 2022-09-29 DIAGNOSIS — F3342 Major depressive disorder, recurrent, in full remission: Secondary | ICD-10-CM

## 2022-09-29 MED ORDER — ATORVASTATIN CALCIUM 40 MG PO TABS: 40.0000 mg | ORAL_TABLET | Freq: Every day | ORAL | 2 refills | Status: DC

## 2022-09-29 MED ORDER — TRAZODONE HCL 50 MG PO TABS: 50.0000 mg | ORAL_TABLET | Freq: Every day | ORAL | 2 refills | Status: DC

## 2022-09-29 MED ORDER — ENALAPRIL MALEATE 10 MG PO TABS: 10.0000 mg | ORAL_TABLET | Freq: Every day | ORAL | 2 refills | Status: DC

## 2022-09-29 MED ORDER — ALPRAZOLAM 0.25 MG PO TABS: 0.2500 mg | ORAL_TABLET | Freq: Three times a day (TID) | ORAL | 0 refills | Status: DC | PRN

## 2022-09-29 MED ORDER — VENLAFAXINE HCL ER 75 MG PO CP24: 75.0000 mg | ORAL_CAPSULE | Freq: Every day | ORAL | 3 refills | Status: DC

## 2022-09-29 MED ORDER — BUPROPION HCL ER (XL) 150 MG PO TB24
ORAL_TABLET | ORAL | 2 refills | Status: DC
Start: 2022-09-29 — End: 2023-06-21

## 2022-09-29 MED ORDER — TRIAMTERENE-HCTZ 37.5-25 MG PO TABS
1.0000 | ORAL_TABLET | Freq: Every day | ORAL | 3 refills | Status: DC
Start: 2022-09-29 — End: 2023-06-28

## 2022-09-29 NOTE — Assessment & Plan Note (Signed)
Orthovisc 3 of 4 both knees, return in 1 week for #4 of 4 

## 2022-09-29 NOTE — Progress Notes (Signed)
    Procedures performed today:    Procedure: Real-time Ultrasound Guided injection of the left knee Device: Samsung HS60  Verbal informed consent obtained.  Time-out conducted.  Noted no overlying erythema, induration, or other signs of local infection.  Skin prepped in a sterile fashion.  Local anesthesia: Topical Ethyl chloride.  With sterile technique and under real time ultrasound guidance: 30 mg/2 mL of OrthoVisc (sodium hyaluronate) in a prefilled syringe was injected easily into the knee through a 22-gauge needle. Completed without difficulty  Advised to call if fevers/chills, erythema, induration, drainage, or persistent bleeding.  Images permanently stored and available for review in PACS.  Impression: Technically successful ultrasound guided injection.   Procedure: Real-time Ultrasound Guided injection of the right knee Device: Samsung HS60  Verbal informed consent obtained.  Time-out conducted.  Noted no overlying erythema, induration, or other signs of local infection.  Skin prepped in a sterile fashion.  Local anesthesia: Topical Ethyl chloride.  With sterile technique and under real time ultrasound guidance: 30 mg/2 mL of OrthoVisc (sodium hyaluronate) in a prefilled syringe was injected easily into the knee through a 22-gauge needle. Completed without difficulty  Advised to call if fevers/chills, erythema, induration, drainage, or persistent bleeding.  Images permanently stored and available for review in PACS.  Impression: Technically successful ultrasound guided injection.  Independent interpretation of notes and tests performed by another provider:   None.  Brief History, Exam, Impression, and Recommendations:    Primary osteoarthritis of both knees Orthovisc 3 of 4 both knees, return in 1 week for #4 of 4    ____________________________________________ Gwen Her. Dianah Field, M.D., ABFM., CAQSM., AME. Primary Care and Sports Medicine [place]  [facility]  Adjunct Professor of [place] of [employer] [facility] of Medicine  Risk manager

## 2022-10-01 ENCOUNTER — Other Ambulatory Visit: Payer: Self-pay | Admitting: Family Medicine

## 2022-10-06 ENCOUNTER — Ambulatory Visit (INDEPENDENT_AMBULATORY_CARE_PROVIDER_SITE_OTHER): Payer: Medicare Other

## 2022-10-06 ENCOUNTER — Encounter: Payer: Self-pay | Admitting: Sports Medicine

## 2022-10-06 ENCOUNTER — Ambulatory Visit (INDEPENDENT_AMBULATORY_CARE_PROVIDER_SITE_OTHER): Payer: Medicare Other | Admitting: Sports Medicine

## 2022-10-06 DIAGNOSIS — M170 Bilateral primary osteoarthritis of knee: Secondary | ICD-10-CM

## 2022-10-06 DIAGNOSIS — M47816 Spondylosis without myelopathy or radiculopathy, lumbar region: Secondary | ICD-10-CM

## 2022-10-06 NOTE — Assessment & Plan Note (Signed)
Norco with PCP, did not respond to L5-S1 epidural, she does have a large L5-S1 disc protrusion, we can certainly consider referral for microdiscectomy.

## 2022-10-06 NOTE — Progress Notes (Addendum)
    Procedures performed today:    Procedure: Real-time Ultrasound Guided injection of the left knee Device: Samsung HS60  Verbal informed consent obtained.  Time-out conducted.  Noted no overlying erythema, induration, or other signs of local infection.  Skin prepped in a sterile fashion.  Local anesthesia: Topical Ethyl chloride.  With sterile technique and under real time ultrasound guidance: Noted trace effusion, 30 mg/2 mL of OrthoVisc (sodium hyaluronate) in a prefilled syringe was injected easily into the knee through a 22-gauge needle. Completed without difficulty  Advised to call if fevers/chills, erythema, induration, drainage, or persistent bleeding.  Images permanently stored and available for review in PACS.  Impression: Technically successful ultrasound guided injection.   Procedure: Real-time Ultrasound Guided injection of the right knee Device: Samsung HS60  Verbal informed consent obtained.  Time-out conducted.  Noted no overlying erythema, induration, or other signs of local infection.  Skin prepped in a sterile fashion.  Local anesthesia: Topical Ethyl chloride.  With sterile technique and under real time ultrasound guidance: Noted trace effusion, 30 mg/2 mL of OrthoVisc (sodium hyaluronate) in a prefilled syringe was injected easily into the knee through a 22-gauge needle. Completed without difficulty  Advised to call if fevers/chills, erythema, induration, drainage, or persistent bleeding.  Images permanently stored and available for review in PACS.  Impression: Technically successful ultrasound guided injection.  Independent interpretation of notes and tests performed by another provider:   None.  Brief History, Exam, Impression, and Recommendations:    Primary osteoarthritis of both knees Orthovisc 4 of 4 both knees, doing really well, return as needed.  Spondylosis of lumbar region without myelopathy or radiculopathy Norco with PCP, did not respond to  L5-S1 epidural, she does have a large L5-S1 disc protrusion, we can certainly consider referral for microdiscectomy.    ____________________________________________ Gwen Her. Dianah Field, M.D., ABFM., CAQSM., AME. Primary Care and Sports Medicine [place] [facility]  Adjunct Professor of [place] [employer] of Medicine  Risk manager

## 2022-10-06 NOTE — Assessment & Plan Note (Signed)
Orthovisc 4 of 4 both knees, doing really well, return as needed.

## 2022-10-08 ENCOUNTER — Encounter: Payer: Self-pay | Admitting: Family Medicine

## 2022-10-15 ENCOUNTER — Encounter: Payer: Self-pay | Admitting: Family Medicine

## 2022-10-15 ENCOUNTER — Other Ambulatory Visit: Payer: Self-pay | Admitting: Family Medicine

## 2022-10-15 ENCOUNTER — Telehealth: Payer: Self-pay

## 2022-10-15 DIAGNOSIS — K769 Liver disease, unspecified: Secondary | ICD-10-CM

## 2022-10-15 NOTE — Telephone Encounter (Signed)
Bonnie [name] called and states she wanted to see about getting the CTA scheduled and hasn't heard anything and she states she is getting very anxious and would like an update please advise.

## 2022-10-15 NOTE — Telephone Encounter (Signed)
Addressed already.  Please see previous mychart messages

## 2022-10-16 ENCOUNTER — Other Ambulatory Visit: Payer: Medicare Other

## 2022-10-19 ENCOUNTER — Ambulatory Visit (INDEPENDENT_AMBULATORY_CARE_PROVIDER_SITE_OTHER): Payer: Medicare Other

## 2022-10-19 DIAGNOSIS — K769 Liver disease, unspecified: Secondary | ICD-10-CM | POA: Diagnosis not present

## 2022-10-19 LAB — I-STAT CREATININE (MANUAL ENTRY): Creatinine, Ser: 1.3 — AB (ref 0.50–1.10)

## 2022-10-19 MED ORDER — IOHEXOL 300 MG/ML  SOLN
100.0000 mL | Freq: Once | INTRAMUSCULAR | Status: AC | PRN
  Administered 2022-10-19: 80 mL via INTRAVENOUS

## 2022-10-21 ENCOUNTER — Encounter: Payer: Self-pay | Admitting: Family Medicine

## 2022-10-22 ENCOUNTER — Other Ambulatory Visit: Payer: Self-pay | Admitting: Family Medicine

## 2022-10-22 DIAGNOSIS — N2889 Other specified disorders of kidney and ureter: Secondary | ICD-10-CM

## 2022-11-02 ENCOUNTER — Encounter: Payer: Self-pay | Admitting: Family Medicine

## 2022-11-20 ENCOUNTER — Other Ambulatory Visit: Payer: Self-pay | Admitting: Family Medicine

## 2022-11-22 ENCOUNTER — Encounter: Payer: Self-pay | Admitting: Family Medicine

## 2022-11-23 MED ORDER — RYBELSUS 14 MG PO TABS: 14.0000 mg | ORAL_TABLET | Freq: Every day | ORAL | 0 refills | Status: DC

## 2022-11-23 NOTE — Telephone Encounter (Signed)
Rybelsus not on current medication list Please advise.

## 2022-11-25 ENCOUNTER — Encounter: Payer: Self-pay | Admitting: Gastroenterology

## 2022-11-25 MED ORDER — RYBELSUS 14 MG PO TABS: 14.0000 mg | ORAL_TABLET | Freq: Every day | ORAL | 0 refills | Status: DC

## 2022-11-25 NOTE — Addendum Note (Signed)
Addended by: Narda Rutherford on: 11/25/2022 08:38 AM   Modules accepted: Orders

## 2022-12-01 DIAGNOSIS — N2889 Other specified disorders of kidney and ureter: Secondary | ICD-10-CM | POA: Insufficient documentation

## 2022-12-15 ENCOUNTER — Ambulatory Visit: Payer: Medicare Other | Admitting: Family Medicine

## 2022-12-22 ENCOUNTER — Ambulatory Visit (INDEPENDENT_AMBULATORY_CARE_PROVIDER_SITE_OTHER): Payer: Medicare Other | Admitting: Family Medicine

## 2022-12-22 ENCOUNTER — Encounter: Payer: Self-pay | Admitting: Family Medicine

## 2022-12-22 VITALS — BP 122/73 | HR 110 | Ht 69.0 in | Wt 306.1 lb

## 2022-12-22 DIAGNOSIS — F3341 Major depressive disorder, recurrent, in partial remission: Secondary | ICD-10-CM

## 2022-12-22 DIAGNOSIS — F5105 Insomnia due to other mental disorder: Secondary | ICD-10-CM

## 2022-12-22 DIAGNOSIS — I10 Essential (primary) hypertension: Secondary | ICD-10-CM | POA: Diagnosis not present

## 2022-12-22 DIAGNOSIS — E785 Hyperlipidemia, unspecified: Secondary | ICD-10-CM

## 2022-12-22 DIAGNOSIS — E1165 Type 2 diabetes mellitus with hyperglycemia: Secondary | ICD-10-CM

## 2022-12-22 DIAGNOSIS — E1169 Type 2 diabetes mellitus with other specified complication: Secondary | ICD-10-CM

## 2022-12-22 DIAGNOSIS — F419 Anxiety disorder, unspecified: Secondary | ICD-10-CM | POA: Diagnosis not present

## 2022-12-22 MED ORDER — ALPRAZOLAM 0.25 MG PO TABS: 0.2500 mg | ORAL_TABLET | Freq: Two times a day (BID) | ORAL | 3 refills | Status: DC | PRN

## 2022-12-22 NOTE — Assessment & Plan Note (Signed)
Continue bupropion at current strength.

## 2022-12-22 NOTE — Assessment & Plan Note (Signed)
Continue trazodone at current strength.  Also has alprazolam to use occasionally if needed.

## 2022-12-22 NOTE — Progress Notes (Signed)
Bonnie [name] - 72 y.o. female MRN [id number]  Date of birth: 1951/09/22  Subjective Chief Complaint  Patient presents with   Follow-up    HPI Bonnie [name] is a 72 year old female here today for follow-up visit.  She reports she is doing well at this time.  Denies any new concerns today.  She has been taking Rybelsus however this is proved to be quite expensive for her.  She is tolerating this well without significant side effects.  Due for updated labs.  She does not typically monitor her blood sugars at home.  Continues on enalapril and Maxide for management of hypertension.  No side effects with current strength medications.  Has not had chest pain, shortness of breath, palpitations, headaches or vision changes.  Trazodone continues to work pretty well for management of her insomnia.  She does use alprazolam occasionally as well.  Depression symptoms are stable with bupropion at current strength.  ROS:  A comprehensive ROS was completed and negative except as noted per HPI  Allergies  Allergen Reactions   Latex Itching and Rash   Metformin And Related Other (See Comments)    Malaise, fatigue   Morphine And Related Rash and Other (See Comments)    Agitation   Tape Swelling and Other (See Comments)    Burning sensation   Tramadol Palpitations    Heart fluttering    Past Medical History:  Diagnosis Date   Abnormal mammogram of right breast 01/20/2018   Anxiety    Benign cyst of right breast 01/26/2018   Blood transfusion without reported diagnosis    Cataract    Centrilobular emphysema ([place]) 07/25/2018   mild   Chronic kidney disease, stage 3a ([place]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   Chronic low back pain    Colon polyps    Depression    Diabetes mellitus without complication (HCC)    Emphysema of lung (HCC)    Fatty liver    Hypertension    Melanoma ([place])    R upper extremity   Melanoma in situ of right upper extremity (HCC)    Mild diastolic dysfunction 05/27/3709    Osteoarthritis of lumbar spine    Osteoporosis    Renal cancer ([place])    Skin cancer    Sleep apnea 2008   cpap nightly   Sphincter of Oddi dysfunction    bile duct    Past Surgical History:  Procedure Laterality Date   ABDOMINAL HYSTERECTOMY     ANKLE SURGERY Left    in her 40s   BILE DUCT STENT PLACEMENT     CARDIAC CATHETERIZATION     CHOLECYSTECTOMY     COLONOSCOPY     LEFT HEART CATH AND CORONARY ANGIOGRAPHY N/A 09/05/2019   Procedure: LEFT HEART CATH AND CORONARY ANGIOGRAPHY;  Surgeon: Leonie Man, MD;  Location: [facility];  Service: Cardiovascular;  Laterality: N/A;   LUMBAR FUSION     7 level    MOHS SURGERY     x 11   PARTIAL NEPHRECTOMY     POLYPECTOMY     TOTAL HIP ARTHROPLASTY Left 03/01/2020   Procedure: LEFT TOTAL HIP ARTHROPLASTY ANTERIOR APPROACH;  Surgeon: Mcarthur Rossetti, MD;  Location: [facility];  Service: Orthopedics;  Laterality: Left;   UPPER GASTROINTESTINAL ENDOSCOPY      Social History   Socioeconomic History   Marital status: Married    Spouse name: Not on file   Number of children: Not on file   Years of education:  Not on file   Highest education level: Not on file  Occupational History   Not on file  Tobacco Use   Smoking status: Former    Packs/day: 1.00    Years: 40.00    Total pack years: 40.00    Types: Cigarettes    Quit date: 12/21/2010    Years since quitting: 12.0   Smokeless tobacco: Never  Vaping Use   Vaping Use: Former   Devices: 2012-2014 while quitting cigaretter  Substance and Sexual Activity   Alcohol use: Yes    Alcohol/week: 1.0 - 2.0 standard drink of alcohol    Types: 1 - 2 Standard drinks or equivalent per week   Drug use: No   Sexual activity: Yes    Birth control/protection: Surgical, None  Other Topics Concern   Not on file  Social History Narrative   Not on file   Social Determinants of Health   Financial Resource Strain: Not on file  Food Insecurity: Not on file  Transportation  Needs: Not on file  Physical Activity: Not on file  Stress: Not on file  Social Connections: Not on file    Family History  Problem Relation Age of Onset   Depression Mother    Hyperlipidemia Mother    Hypertension Mother    Colon polyps Mother    Heart attack Father    Hyperlipidemia Father    Hypertension Father    Colon polyps Father    Alcohol abuse Sister    Depression Sister    Alcohol abuse Son    Alcohol abuse Son    Alcohol abuse Sister    Depression Sister    Stomach cancer Brother    Colon cancer Neg Hx    Esophageal cancer Neg Hx    Rectal cancer Neg Hx     Health Maintenance  Topic Date Due   Medicare Annual Wellness (AWV)  Never done   Lung Cancer Screening  08/23/2021   Diabetic kidney evaluation - eGFR measurement  10/28/2022   OPHTHALMOLOGY EXAM  12/01/2022   INFLUENZA VACCINE  03/21/2023 (Originally 07/21/2022)   COLONOSCOPY (Pts 45-72yr Insurance coverage will need to be confirmed)  12/23/2023 (Originally 12/07/2022)   COVID-19 Vaccine (7 - 2023-24 season) 01/08/2024 (Originally 11/07/2022)   HEMOGLOBIN A1C  02/23/2023   FOOT EXAM  05/23/2023   Diabetic kidney evaluation - Urine ACR  08/26/2023   MAMMOGRAM  07/22/2024   DTaP/Tdap/Td (2 - Td or Tdap) 05/22/2032   Pneumonia Vaccine 72 Years old  Completed   DEXA SCAN  Completed   Hepatitis C Screening  Completed   Zoster Vaccines- Shingrix  Completed   HPV VACCINES  Aged Out     ----------------------------------------------------------------------------------------------------------------------------------------------------------------------------------------------------------------- Physical Exam BP 122/73 (BP Location: Right Arm, Patient Position: Sitting, Cuff Size: Large)   [name] (!) 110   Ht _0  (1.753 m)   Wt (!) 306 lb 1.9 oz (138.9 kg)   SpO2 98%   BMI 45.21 kg/m   Physical Exam Constitutional:      Appearance: Normal appearance.  HENT:     Head: Normocephalic and atraumatic.   Eyes:     General: No scleral icterus. Cardiovascular:     Rate and Rhythm: Normal rate and regular rhythm.  Pulmonary:     Effort: Pulmonary effort is normal.     Breath sounds: Normal breath sounds.  Musculoskeletal:     Cervical back: Neck supple.  Neurological:     Mental Status: She is alert.  Psychiatric:  Mood and Affect: Mood normal.        Behavior: Behavior normal.     ------------------------------------------------------------------------------------------------------------------------------------------------------------------------------------------------------------------- Assessment and Plan  Essential hypertension Blood pressure is well-controlled with current medications.  Will plan to continue enalapril and Maxide at current strength.  Type 2 diabetes mellitus with hyperglycemia, without long-term current use of insulin (HCC) Updated A1c ordered.  Tolerating Rybelsus pretty well at this time however cost has started to become an issue with this.  She will let me know if this becomes unaffordable.  Dyslipidemia associated with type 2 diabetes mellitus ([place]) Continue atorvastatin encouraging.  Insomnia secondary to anxiety Continue trazodone at current strength.  Also has alprazolam to use occasionally if needed.  Recurrent major depressive disorder, in partial remission (HCC) Continue bupropion at current strength.   Meds ordered this encounter  Medications   ALPRAZolam (XANAX) 0.25 MG tablet    Sig: Take 1-2 tablets (0.25-0.5 mg total) by mouth 2 (two) times daily as needed for anxiety or sleep.    Dispense:  30 tablet    Refill:  3    Not to exceed 5 additional fills before 12/28/2021    Return in about 6 months (around 06/22/2023) for HTN/T2DM.    This visit occurred during the SARS-CoV-2 public health emergency.  Safety protocols were in place, including screening questions prior to the visit, additional usage of staff PPE, and extensive  cleaning of exam room while observing appropriate contact time as indicated for disinfecting solutions.

## 2022-12-22 NOTE — Assessment & Plan Note (Signed)
Blood pressure is well-controlled with current medications.  Will plan to continue enalapril and Maxide at current strength.

## 2022-12-22 NOTE — Assessment & Plan Note (Signed)
Continue atorvastatin encouraging.

## 2022-12-22 NOTE — Assessment & Plan Note (Signed)
Updated A1c ordered.  Tolerating Rybelsus pretty well at this time however cost has started to become an issue with this.  She will let me know if this becomes unaffordable.

## 2022-12-25 ENCOUNTER — Encounter: Payer: Self-pay | Admitting: Family Medicine

## 2022-12-25 ENCOUNTER — Other Ambulatory Visit: Payer: Self-pay | Admitting: Family Medicine

## 2022-12-25 LAB — HEMOGLOBIN A1C
Hgb A1c MFr Bld: 6.1 % of total Hgb — ABNORMAL HIGH (ref <5.7)
Mean Plasma Glucose: 128 mg/dL
eAG (mmol/L): 7.1 mmol/L

## 2022-12-25 LAB — CBC WITH DIFFERENTIAL/PLATELET
Absolute Monocytes: 547 cells/uL (ref 200–950)
Basophils Absolute: 71 cells/uL (ref 0–200)
Basophils Relative: 1 %
Eosinophils Absolute: 178 cells/uL (ref 15–500)
Eosinophils Relative: 2.5 %
HCT: 38.8 % (ref 35.0–45.0)
Hemoglobin: 12.7 g/dL (ref 11.7–15.5)
Lymphs Abs: 1782 cells/uL (ref 850–3900)
MCH: 29.2 pg (ref 27.0–33.0)
MCHC: 32.7 g/dL (ref 32.0–36.0)
MCV: 89.2 fL (ref 80.0–100.0)
MPV: 10.3 fL (ref 7.5–12.5)
Monocytes Relative: 7.7 %
Neutro Abs: 4523 cells/uL (ref 1500–7800)
Neutrophils Relative %: 63.7 %
Platelets: 364 10*3/uL (ref 140–400)
RBC: 4.35 10*6/uL (ref 3.80–5.10)
RDW: 12.7 % (ref 11.0–15.0)
Total Lymphocyte: 25.1 %
WBC: 7.1 10*3/uL (ref 3.8–10.8)

## 2022-12-25 LAB — COMPLETE METABOLIC PANEL WITH GFR
AG Ratio: 1.4 (calc) (ref 1.0–2.5)
ALT: 15 U/L (ref 6–29)
AST: 15 U/L (ref 10–35)
Albumin: 4 g/dL (ref 3.6–5.1)
Alkaline phosphatase (APISO): 101 U/L (ref 37–153)
BUN/Creatinine Ratio: 14 (calc) (ref 6–22)
BUN: 20 mg/dL (ref 7–25)
CO2: 28 mmol/L (ref 20–32)
Calcium: 9.7 mg/dL (ref 8.6–10.4)
Chloride: 104 mmol/L (ref 98–110)
Creat: 1.4 mg/dL — ABNORMAL HIGH (ref 0.60–1.00)
Globulin: 2.8 g/dL (calc) (ref 1.9–3.7)
Glucose, Bld: 106 mg/dL — ABNORMAL HIGH (ref 65–99)
Potassium: 4.4 mmol/L (ref 3.5–5.3)
Sodium: 141 mmol/L (ref 135–146)
Total Bilirubin: 0.6 mg/dL (ref 0.2–1.2)
Total Protein: 6.8 g/dL (ref 6.1–8.1)
eGFR: 40 mL/min/{1.73_m2} — ABNORMAL LOW (ref >=60)

## 2022-12-25 LAB — LIPID PANEL W/REFLEX DIRECT LDL
Cholesterol: 167 mg/dL (ref <200)
HDL: 56 mg/dL (ref >=50)
LDL Cholesterol (Calc): 90 mg/dL (calc)
Non-HDL Cholesterol (Calc): 111 mg/dL (calc) (ref <130)
Total CHOL/HDL Ratio: 3 (calc) (ref <5.0)
Triglycerides: 118 mg/dL (ref <150)

## 2022-12-25 LAB — MICROALBUMIN / CREATININE URINE RATIO
Creatinine, Urine: 326 mg/dL — ABNORMAL HIGH (ref 20–275)
Microalb Creat Ratio: 6 mcg/mg creat (ref <30)
Microalb, Ur: 2.1 mg/dL

## 2022-12-25 MED ORDER — DAPAGLIFLOZIN PROPANEDIOL 10 MG PO TABS: 10.0000 mg | ORAL_TABLET | Freq: Every day | ORAL | 3 refills | Status: DC

## 2023-01-04 ENCOUNTER — Ambulatory Visit (AMBULATORY_SURGERY_CENTER): Payer: Medicare Other

## 2023-01-04 ENCOUNTER — Other Ambulatory Visit: Payer: Self-pay

## 2023-01-04 VITALS — Ht 69.0 in | Wt 305.0 lb

## 2023-01-04 DIAGNOSIS — Z86010 Personal history of colonic polyps: Secondary | ICD-10-CM

## 2023-01-04 MED ORDER — PEG 3350-KCL-NA BICARB-NACL 420 G PO SOLR: 4000.0000 mL | Freq: Once | ORAL | 0 refills | Status: AC

## 2023-01-04 MED ORDER — NA SULFATE-K SULFATE-MG SULF 17.5-3.13-1.6 GM/177ML PO SOLN: 1.0000 | Freq: Once | ORAL | 0 refills | Status: DC

## 2023-01-04 NOTE — Progress Notes (Signed)

## 2023-01-20 ENCOUNTER — Telehealth: Payer: Self-pay | Admitting: Internal Medicine

## 2023-01-20 MED ORDER — METOCLOPRAMIDE HCL 10 MG PO TABS: ORAL_TABLET | ORAL | 0 refills | Status: DC

## 2023-01-20 NOTE — Telephone Encounter (Signed)
Patient took in first part of GoLytely prep -having loose stools but then started to vomit and is very nauseous  Plan:  Metaclopramide 10 mg now (soon) and then 30-60 mins before next phase of prep  She may modify and only drink 1 L (minimum) for next phase

## 2023-01-21 ENCOUNTER — Encounter: Payer: Self-pay | Admitting: Gastroenterology

## 2023-01-21 ENCOUNTER — Ambulatory Visit (AMBULATORY_SURGERY_CENTER): Payer: Medicare Other | Admitting: Gastroenterology

## 2023-01-21 VITALS — BP 121/69 | HR 88 | Temp 98.4°F | Resp 19 | Ht 69.0 in | Wt 305.0 lb

## 2023-01-21 DIAGNOSIS — D123 Benign neoplasm of transverse colon: Secondary | ICD-10-CM | POA: Diagnosis not present

## 2023-01-21 DIAGNOSIS — Z86010 Personal history of colonic polyps: Secondary | ICD-10-CM | POA: Diagnosis not present

## 2023-01-21 DIAGNOSIS — Z09 Encounter for follow-up examination after completed treatment for conditions other than malignant neoplasm: Secondary | ICD-10-CM

## 2023-01-21 MED ORDER — SODIUM CHLORIDE 0.9 % IV SOLN: 500.0000 mL | Freq: Once | INTRAVENOUS | Status: DC

## 2023-01-21 NOTE — Telephone Encounter (Signed)
Thanks Carl °

## 2023-01-21 NOTE — Patient Instructions (Signed)
Resume previous diet and medications. Awaiting pathology results. Repeat Colonoscopy date to be determined based on pathology results. Handouts provided on Colon polyps, Diverticulosis and Hemorrhoids   YOU HAD AN ENDOSCOPIC PROCEDURE TODAY AT THE [facility]:   Refer to the procedure report that was given to you for any specific questions about what was found during the examination.  If the procedure report does not answer your questions, please call your gastroenterologist to clarify.  If you requested that your care partner not be given the details of your procedure findings, then the procedure report has been included in a sealed envelope for you to review at your convenience later.  YOU SHOULD EXPECT: Some feelings of bloating in the abdomen. Passage of more gas than usual.  Walking can help get rid of the air that was put into your GI tract during the procedure and reduce the bloating. If you had a lower endoscopy (such as a colonoscopy or flexible sigmoidoscopy) you may notice spotting of blood in your stool or on the toilet paper. If you underwent a bowel prep for your procedure, you may not have a normal bowel movement for a few days.  Please Note:  You might notice some irritation and congestion in your nose or some drainage.  This is from the oxygen used during your procedure.  There is no need for concern and it should clear up in a day or so.  SYMPTOMS TO REPORT IMMEDIATELY:  Following lower endoscopy (colonoscopy or flexible sigmoidoscopy):  Excessive amounts of blood in the stool  Significant tenderness or worsening of abdominal pains  Swelling of the abdomen that is new, acute  Fever of 100F or higher  For urgent or emergent issues, a gastroenterologist can be reached at any hour by calling [phone]. Do not use MyChart messaging for urgent concerns.    DIET:  We do recommend a small meal at first, but then you may proceed to your regular diet.  Drink plenty of  fluids but you should avoid alcoholic beverages for 24 hours.  ACTIVITY:  You should plan to take it easy for the rest of today and you should NOT DRIVE or use heavy machinery until tomorrow (because of the sedation medicines used during the test).    FOLLOW UP: Our staff will call the number listed on your records the next business day following your procedure.  We will call around 7:15- 8:00 am to check on you and address any questions or concerns that you may have regarding the information given to you following your procedure. If we do not reach you, we will leave a message.     If any biopsies were taken you will be contacted by phone or by letter within the next 1-3 weeks.  Please call us at [phone] if you have not heard about the biopsies in 3 weeks.    SIGNATURES/CONFIDENTIALITY: You and/or your care partner have signed paperwork which will be entered into your electronic medical record.  These signatures attest to the fact that that the information above on your After Visit Summary has been reviewed and is understood.  Full responsibility of the confidentiality of this discharge information lies with you and/or your care-partner. 

## 2023-01-21 NOTE — Progress Notes (Signed)
To pacu, VSS. Report to [email or web address] 

## 2023-01-21 NOTE — Progress Notes (Signed)
Pt's states no medical or surgical changes since previsit or office visit. 

## 2023-01-21 NOTE — Op Note (Signed)
[place] Patient Name: Bonnie [name] Procedure Date: 01/21/2023 8:57 AM MRN: [id number] Endoscopist: Remo Lipps P. Havery Moros , MD, [id number] Age: 72 Referring MD:  Date of Birth: Aug 18, 1951 Gender: Female Account #: [id number] Procedure:                Colonoscopy Indications:              High risk colon cancer surveillance: Personal                            history of colonic polyps - 6 polyps with adenomas                            removed 11/2019 Medicines:                Monitored Anesthesia Care Procedure:                Pre-Anesthesia Assessment:                           - Prior to the procedure, a History and Physical                            was performed, and patient medications and                            allergies were reviewed. The patient's tolerance of                            previous anesthesia was also reviewed. The risks                            and benefits of the procedure and the sedation                            options and risks were discussed with the patient.                            All questions were answered, and informed consent                            was obtained. Prior Anticoagulants: The patient has                            taken no anticoagulant or antiplatelet agents. ASA                            Grade Assessment: III - A patient with severe                            systemic disease. After reviewing the risks and                            benefits, the patient was deemed in satisfactory  condition to undergo the procedure.                           After obtaining informed consent, the colonoscope                            was passed under direct vision. Throughout the                            procedure, the patient's blood pressure, pulse, and                            oxygen saturations were monitored continuously. The                            CF [id number] #[id number] was introduced  through the anus                            and advanced to the the cecum, identified by                            appendiceal orifice and ileocecal valve. The                            colonoscopy was performed without difficulty. The                            patient tolerated the procedure well. The quality                            of the bowel preparation was good. The ileocecal                            valve, appendiceal orifice, and rectum were                            photographed. Scope In: 9:06:42 AM Scope Out: 9:25:46 AM Scope Withdrawal Time: 0 hours 13 minutes 22 seconds  Total Procedure Duration: 0 hours 19 minutes 4 seconds  Findings:                 The perianal and digital rectal examinations were                            normal.                           Three angiodysplastic lesions were found in the                            cecum.                           Two flat and sessile polyps were found in the  splenic flexure. The polyps were 3 mm in size.                            These polyps were removed with a cold snare.                            Resection and retrieval were complete.                           A few medium-mouthed diverticula were found in the                            left colon and right colon.                           Internal hemorrhoids were found during                            retroflexion. The hemorrhoids were moderate.                           The exam was otherwise without abnormality. Complications:            No immediate complications. Estimated blood loss:                            Minimal. Estimated Blood Loss:     Estimated blood loss was minimal. Impression:               - Three colonic angiodysplastic lesions.                           - Two 3 mm polyps at the splenic flexure, removed                            with a cold snare. Resected and retrieved.                           -  Diverticulosis in the left colon and in the right                            colon.                           - Internal hemorrhoids.                           - The examination was otherwise normal. Recommendation:           - Patient has a contact number available for                            emergencies. The signs and symptoms of potential                            delayed complications were discussed with the  patient. Return to normal activities tomorrow.                            Written discharge instructions were provided to the                            patient.                           - Resume previous diet.                           - Continue present medications.                           - Await pathology results. Anticipate repeat                            colonoscopy in 5 years Carlota Raspberry. Jihan Rudy, MD 01/21/2023 9:31:38 AM This report has been signed electronically.

## 2023-01-21 NOTE — Progress Notes (Signed)
Called to room to assist during endoscopic procedure.  Patient ID and intended procedure confirmed with present staff. Received instructions for my participation in the procedure from the performing physician.  

## 2023-01-21 NOTE — Progress Notes (Signed)
[facility] History and Physical   Primary Care Physician:  Luetta Nutting, DO   Reason for Procedure:   History of colon polyps  Plan:    colonoscopy     HPI: Bonnie [name] is a 72 y.o. female  here for colonoscopy surveillance - 6 polyps removed 11/2019.   Patient denies any bowel symptoms at this time. Some mild constipation. No family history of colon cancer known. Otherwise feels well without any cardiopulmonary symptoms.   I have discussed risks / benefits of anesthesia and endoscopic procedure with Micael Hampshire and they wish to proceed with the exams as outlined today.    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   Chronic low back pain    Colon polyps    Depression    Diabetes mellitus without complication (HCC)    Fatty liver    Hypertension    Melanoma ([place])    R upper extremity   Melanoma in situ of right upper extremity (HCC)    Mild diastolic dysfunction 19/62/2297   Osteoarthritis of lumbar spine    Renal cancer ([place])    Skin cancer    Sleep apnea 2008   cpap nightly    Past Surgical History:  Procedure Laterality Date   ABDOMINAL HYSTERECTOMY     ANKLE SURGERY Left    in her 75s   BILE DUCT STENT PLACEMENT     CARDIAC CATHETERIZATION     CHOLECYSTECTOMY     COLONOSCOPY     LEFT HEART CATH AND CORONARY ANGIOGRAPHY N/A 09/05/2019   Procedure: LEFT HEART CATH AND CORONARY ANGIOGRAPHY;  Surgeon: Leonie Man, MD;  Location: [facility];  Service: Cardiovascular;  Laterality: N/A;   LUMBAR FUSION     7 level    MOHS SURGERY     x 11   PARTIAL NEPHRECTOMY     POLYPECTOMY     TOTAL HIP ARTHROPLASTY Left 03/01/2020   Procedure: LEFT TOTAL HIP ARTHROPLASTY ANTERIOR APPROACH;  Surgeon: Mcarthur Rossetti, MD;  Location: [facility];  Service: Orthopedics;  Laterality: Left;   UPPER GASTROINTESTINAL ENDOSCOPY      Prior to Admission medications   Medication Sig Start Date End Date Taking? Authorizing Provider  ALPRAZolam (XANAX) 0.25 MG tablet Take 1-2 tablets (0.25-0.5 mg total) by mouth 2 (two) times daily as needed for anxiety or sleep. 12/22/22  Yes Luetta Nutting, DO  aspirin 81 MG chewable tablet Chew by mouth daily.   Yes [provider]  atorvastatin (LIPITOR) 40 MG tablet Take 1 tablet (40 mg total) by mouth daily. 09/29/22  Yes Luetta Nutting, DO  buPROPion (WELLBUTRIN XL) 150 MG 24 hr tablet TAKE 1 TABLET BY MOUTH EVERY DAY IN THE MORNING 09/29/22  Yes Luetta Nutting, DO  Cholecalciferol (VITAMIN D3) 50 MCG (2000 UT) TABS Take 2,000 Units by mouth daily. 10/10/19  Yes Emeterio Reeve, DO  dapagliflozin propanediol (FARXIGA) 10 MG TABS tablet Take 1 tablet (10 mg total) by mouth daily before breakfast. 12/25/22  Yes Luetta Nutting, DO  enalapril (VASOTEC) 10 MG tablet Take 1 tablet (10 mg total) by mouth daily. 09/29/22  Yes Luetta Nutting, DO  fluticasone (FLONASE) 50 MCG/ACT nasal spray Place 2 sprays into both nostrils daily  as needed for allergies or rhinitis.  10/09/19  Yes [provider]  metoCLOPramide (REGLAN) 10 MG tablet Take 10 mg when pick up Rx and 10 mg 30-60 mins before next colonoscopy prep session 01/20/23  Yes Gatha Mayer, MD  Multiple Vitamin tablet Take 1 tablet by mouth daily.    Yes [provider]  Omega-3 Fatty Acids (FISH OIL) 1000 MG CAPS Take 1,000 mg by mouth daily.    Yes [provider]  Semaglutide (RYBELSUS) 14 MG TABS Take 1 tablet (14 mg total) by mouth daily. 11/25/22  Yes Luetta Nutting, DO  triamterene-hydrochlorothiazide (MAXZIDE-25) 37.5-25 MG tablet Take 1 tablet by mouth daily. 09/29/22  Yes Luetta Nutting, DO  venlafaxine XR (EFFEXOR-XR) 75 MG 24 hr capsule Take 1 capsule (75 mg total) by mouth daily. 09/29/22   Yes Luetta Nutting, DO  blood glucose meter kit and supplies KIT Check morning fasting blood glucose and up to four times daily as directed. Patient not taking: Reported on 01/04/2023 04/04/18   Trixie Dredge, PA-C  HYDROcodone-acetaminophen (NORCO/VICODIN) 5-325 MG tablet Take 1 tablet by mouth every 8 (eight) hours as needed for moderate pain or severe pain. Patient not taking: Reported on 01/04/2023 08/25/22   Luetta Nutting, DO  Lancets [facility] DELICA PLUS [id number]) MISC USE AS DIRECTED UP TO 4    TIMES DAILY Patient not taking: Reported on 01/04/2023 04/13/22   Luetta Nutting, DO  ONETOUCH VERIO test strip USE UP TO 4 TIMES A DAY AS DIRECTED WITH GLUCOMETER Patient not taking: Reported on 01/04/2023 04/13/22   Luetta Nutting, DO  traZODone (DESYREL) 50 MG tablet Take 1 tablet (50 mg total) by mouth at bedtime. Patient not taking: Reported on 01/21/2023 09/29/22   Luetta Nutting, DO    Current Outpatient Medications  Medication Sig Dispense Refill   ALPRAZolam (XANAX) 0.25 MG tablet Take 1-2 tablets (0.25-0.5 mg total) by mouth 2 (two) times daily as needed for anxiety or sleep. 30 tablet 3   aspirin 81 MG chewable tablet Chew by mouth daily.     atorvastatin (LIPITOR) 40 MG tablet Take 1 tablet (40 mg total) by mouth daily. 90 tablet 2   buPROPion (WELLBUTRIN XL) 150 MG 24 hr tablet TAKE 1 TABLET BY MOUTH EVERY DAY IN THE MORNING 90 tablet 2   Cholecalciferol (VITAMIN D3) 50 MCG (2000 UT) TABS Take 2,000 Units by mouth daily. 90 tablet 3   dapagliflozin propanediol (FARXIGA) 10 MG TABS tablet Take 1 tablet (10 mg total) by mouth daily before breakfast. 90 tablet 3   enalapril (VASOTEC) 10 MG tablet Take 1 tablet (10 mg total) by mouth daily. 90 tablet 2   fluticasone (FLONASE) 50 MCG/ACT nasal spray Place 2 sprays into both nostrils daily as needed for allergies or rhinitis.      metoCLOPramide (REGLAN) 10 MG tablet Take 10 mg when pick up Rx and 10 mg 30-60 mins before next  colonoscopy prep session 2 tablet 0   Multiple Vitamin tablet Take 1 tablet by mouth daily.      Omega-3 Fatty Acids (FISH OIL) 1000 MG CAPS Take 1,000 mg by mouth daily.      Semaglutide (RYBELSUS) 14 MG TABS Take 1 tablet (14 mg total) by mouth daily. 90 tablet 0   triamterene-hydrochlorothiazide (MAXZIDE-25) 37.5-25 MG tablet Take 1 tablet by mouth daily. 90 tablet 3   venlafaxine XR (EFFEXOR-XR) 75 MG 24 hr capsule Take 1 capsule (75 mg total) by mouth daily. 90 capsule 3  blood glucose meter kit and supplies KIT Check morning fasting blood glucose and up to four times daily as directed. (Patient not taking: Reported on 01/04/2023) 1 each 0   HYDROcodone-acetaminophen (NORCO/VICODIN) 5-325 MG tablet Take 1 tablet by mouth every 8 (eight) hours as needed for moderate pain or severe pain. (Patient not taking: Reported on 01/04/2023) 60 tablet 0   Lancets (ONETOUCH DELICA PLUS [id number]) MISC USE AS DIRECTED UP TO 4    TIMES DAILY (Patient not taking: Reported on 01/04/2023) 100 each 14   ONETOUCH VERIO test strip USE UP TO 4 TIMES A DAY AS DIRECTED WITH GLUCOMETER (Patient not taking: Reported on 01/04/2023) 100 strip 14   traZODone (DESYREL) 50 MG tablet Take 1 tablet (50 mg total) by mouth at bedtime. (Patient not taking: Reported on 01/21/2023) 90 tablet 2   Current Facility-Administered Medications  Medication Dose Route Frequency Provider Last Rate Last Admin   0.9 %  sodium chloride infusion  500 mL Intravenous Once Sarann Tregre, Carlota Raspberry, MD        Allergies as of 01/21/2023 - Review Complete 01/21/2023  Allergen Reaction Noted   Latex Itching and Rash 05/19/2017   Metformin and related Other (See Comments) 07/06/2018   Morphine and related Rash and Other (See Comments) 10/06/2018   Tape Swelling and Other (See Comments) 05/19/2017   Tramadol Palpitations 08/29/2015    Family History  Problem Relation Age of Onset   Depression Mother    Hyperlipidemia Mother    Hypertension Mother     Colon polyps Mother    Heart attack Father    Hyperlipidemia Father    Hypertension Father    Colon polyps Father    Alcohol abuse Sister    Depression Sister    Alcohol abuse Son    Alcohol abuse Son    Alcohol abuse Sister    Depression Sister    Stomach cancer Brother    Colon cancer Neg Hx    Esophageal cancer Neg Hx    Rectal cancer Neg Hx     Social History   Socioeconomic History   Marital status: Married    Spouse name: Not on file   Number of children: Not on file   Years of education: Not on file   Highest education level: Not on file  Occupational History   Not on file  Tobacco Use   Smoking status: Former    Packs/day: 1.00    Years: 40.00    Total pack years: 40.00    Types: Cigarettes    Quit date: 12/21/2010    Years since quitting: 12.0   Smokeless tobacco: Never  Vaping Use   Vaping Use: Former   Devices: 2012-2014 while quitting cigaretter  Substance and Sexual Activity   Alcohol use: Yes    Alcohol/week: 1.0 - 2.0 standard drink of alcohol    Types: 1 - 2 Standard drinks or equivalent per week   Drug use: No   Sexual activity: Yes    Birth control/protection: Surgical, None  Other Topics Concern   Not on file  Social History Narrative   Not on file   Social Determinants of Health   Financial Resource Strain: Not on file  Food Insecurity: Not on file  Transportation Needs: Not on file  Physical Activity: Not on file  Stress: Not on file  Social Connections: Not on file  Intimate Partner Violence: Not on file    Review of Systems: All other review of systems negative except as  mentioned in the HPI.  Physical Exam: Vital signs BP 112/75   Pulse 88   Temp 98.4 F (36.9 C)   Ht '5\' 9"'$  (1.753 m)   Wt (!) 305 lb (138.3 kg)   SpO2 95%   BMI 45.04 kg/m   General:   Alert,  Well-developed, pleasant and cooperative in NAD Lungs:  Clear throughout to auscultation.   Heart:  Regular rate and rhythm Abdomen:  Soft, nontender and  nondistended.   Neuro/Psych:  Alert and cooperative. Normal mood and affect. A and O x 3  Jolly Mango, MD [facility]

## 2023-01-22 ENCOUNTER — Telehealth: Payer: Self-pay

## 2023-01-22 NOTE — Telephone Encounter (Signed)
Follow up call placed, VM obtained and message left. 

## 2023-01-25 ENCOUNTER — Telehealth: Payer: Self-pay | Admitting: Gastroenterology

## 2023-01-25 NOTE — Telephone Encounter (Signed)
Returned call to patient. I informed patient that her symptoms may be related to her GERD. Pt is not currently taking any medication for this. Based on 2021 office note, Anderson Malta recommended patient try Pepcid 20 mg daily. I advised pt to try the daily dose for 1 week and if no improvement she can increase to BID. Pt has been advised to follow an antireflux diet. Pt has been advised to call back if she has ongoing symptoms despite recommendations. Pt verbalized understanding and had no concerns at the end of the call.

## 2023-01-25 NOTE — Telephone Encounter (Signed)
Inbound call from patient stating that she has been having a burning feeling on her left side after she eats and she starts burping after eating. Patient stated she has changed eating certain things but it seems that no matter what she eats, it still continues. Patient is requesting a call back to discuss and to see what recommendations Dr. Havery Moros would have. Please advise.

## 2023-02-19 ENCOUNTER — Other Ambulatory Visit: Payer: Self-pay | Admitting: Family Medicine

## 2023-03-24 ENCOUNTER — Other Ambulatory Visit: Payer: Self-pay

## 2023-03-24 DIAGNOSIS — M47816 Spondylosis without myelopathy or radiculopathy, lumbar region: Secondary | ICD-10-CM

## 2023-03-24 DIAGNOSIS — F419 Anxiety disorder, unspecified: Secondary | ICD-10-CM

## 2023-03-25 MED ORDER — ALPRAZOLAM 0.25 MG PO TABS: 0.2500 mg | ORAL_TABLET | Freq: Two times a day (BID) | ORAL | 3 refills | Status: DC | PRN

## 2023-03-25 MED ORDER — HYDROCODONE-ACETAMINOPHEN 5-325 MG PO TABS: 1.0000 | ORAL_TABLET | Freq: Three times a day (TID) | ORAL | 0 refills | Status: DC | PRN

## 2023-03-30 ENCOUNTER — Ambulatory Visit (INDEPENDENT_AMBULATORY_CARE_PROVIDER_SITE_OTHER): Payer: Medicare Other | Admitting: Family Medicine

## 2023-03-30 DIAGNOSIS — Z780 Asymptomatic menopausal state: Secondary | ICD-10-CM | POA: Diagnosis not present

## 2023-03-30 DIAGNOSIS — Z0000 Encounter for general adult medical examination without abnormal findings: Secondary | ICD-10-CM

## 2023-03-30 NOTE — Patient Instructions (Signed)
MEDICARE ANNUAL WELLNESS VISIT Health Maintenance Summary and Written Plan of Care  Bonnie [name] ,  Thank you for allowing me to perform your Medicare Annual Wellness Visit and for your ongoing commitment to your health.   Health Maintenance & Immunization History Health Maintenance  Topic Date Due   COVID-19 Vaccine (7 - 2023-24 season) 01/08/2024 (Originally 11/07/2022)   Lung Cancer Screening  03/29/2024 (Originally 08/23/2021)   FOOT EXAM  05/23/2023   HEMOGLOBIN A1C  06/24/2023   INFLUENZA VACCINE  07/22/2023   Diabetic kidney evaluation - eGFR measurement  12/25/2023   Diabetic kidney evaluation - Urine ACR  12/25/2023   OPHTHALMOLOGY EXAM  03/24/2024   Medicare Annual Wellness (AWV)  03/29/2024   MAMMOGRAM  07/22/2024   COLONOSCOPY (Pts 45-30yrs Insurance coverage will need to be confirmed)  01/22/2028   DTaP/Tdap/Td (2 - Td or Tdap) 05/22/2032   Pneumonia Vaccine 62+ Years old  Completed   DEXA SCAN  Completed   Hepatitis C Screening  Completed   Zoster Vaccines- Shingrix  Completed   HPV VACCINES  Aged Out   Immunization History  Administered Date(s) Administered   COVID-19, mRNA, vaccine(Comirnaty)12 years and older 09/12/2022   Fluad Quad(high Dose 65+) 08/08/2019   Influenza Split 07/23/2020   Influenza,inj,Quad PF,6+ Mos 10/04/2018   Influenza-Unspecified 08/23/2020, 09/23/2021   Moderna Sars-Covid-2 Vaccination 01/16/2020, 02/13/2020, 10/15/2020, 05/01/2021   Pfizer Covid-19 Vaccine Bivalent Booster 20yrs & up 09/23/2021   Pneumococcal Conjugate-13 05/19/2017   Pneumococcal Polysaccharide-23 10/19/2011, 07/06/2018   Tdap 05/22/2022   Zoster Recombinat (Shingrix) 07/28/2018, 10/06/2018   Zoster, Live 02/18/2014    These are the patient goals that we discussed:  Goals Addressed               This Visit's Progress     Patient Stated (pt-stated)        Patient stated that she would like to continue to work on her weight.         This is a list of  Health Maintenance Items that are overdue or due now: Bone densitometry screening    Orders/Referrals Placed Today: Orders Placed This Encounter  Procedures   DEXAScan    Standing Status:   Future    Standing Expiration Date:   03/29/2024    Scheduling Instructions:     Please call patient to schedule.    Order Specific Question:   Reason for exam:    Answer:   post menopausal    Order Specific Question:   Preferred imaging location?    Answer:   [facility]   (Contact our referral department at [phone] if you have not spoken with someone about your referral appointment within the next 5 days)    Follow-up Plan Follow-up with Everrett Coombe, DO as planned Schedule lung cancer screening.  Medicare wellness visit in one year.  Patient will access AVS on my chart.      Health Maintenance, Female Adopting a healthy lifestyle and getting preventive care are important in promoting health and wellness. Ask your health care provider about: The right schedule for you to have regular tests and exams. Things you can do on your own to prevent diseases and keep yourself healthy. What should I know about diet, weight, and exercise? Eat a healthy diet  Eat a diet that includes plenty of vegetables, fruits, low-fat dairy products, and lean protein. Do not eat a lot of foods that are high in solid fats, added sugars, or sodium. Maintain a healthy weight  Body mass index (BMI) is used to identify weight problems. It estimates body fat based on height and weight. Your health care provider can help determine your BMI and help you achieve or maintain a healthy weight. Get regular exercise Get regular exercise. This is one of the most important things you can do for your health. Most adults should: Exercise for at least 150 minutes each week. The exercise should increase your heart rate and make you sweat (moderate-intensity exercise). Do strengthening exercises at least twice a  week. This is in addition to the moderate-intensity exercise. Spend less time sitting. Even light physical activity can be beneficial. Watch cholesterol and blood lipids Have your blood tested for lipids and cholesterol at 72 years of age, then have this test every 5 years. Have your cholesterol levels checked more often if: Your lipid or cholesterol levels are high. You are older than 72 years of age. You are at high risk for heart disease. What should I know about cancer screening? Depending on your health history and family history, you may need to have cancer screening at various ages. This may include screening for: Breast cancer. Cervical cancer. Colorectal cancer. Skin cancer. Lung cancer. What should I know about heart disease, diabetes, and high blood pressure? Blood pressure and heart disease High blood pressure causes heart disease and increases the risk of stroke. This is more likely to develop in people who have high blood pressure readings or are overweight. Have your blood pressure checked: Every 3-5 years if you are 6918-72 years of age. Every year if you are 667 years old or older. Diabetes Have regular diabetes screenings. This checks your fasting blood sugar level. Have the screening done: Once every three years after age 340 if you are at a normal weight and have a low risk for diabetes. More often and at a younger age if you are overweight or have a high risk for diabetes. What should I know about preventing infection? Hepatitis B If you have a higher risk for hepatitis B, you should be screened for this virus. Talk with your health care provider to find out if you are at risk for hepatitis B infection. Hepatitis C Testing is recommended for: Everyone born from [id number] through 1965. Anyone with known risk factors for hepatitis C. Sexually transmitted infections (STIs) Get screened for STIs, including gonorrhea and chlamydia, if: You are sexually active and are younger  than 72 years of age. You are older than 72 years of age and your health care provider tells you that you are at risk for this type of infection. Your sexual activity has changed since you were last screened, and you are at increased risk for chlamydia or gonorrhea. Ask your health care provider if you are at risk. Ask your health care provider about whether you are at high risk for HIV. Your health care provider may recommend a prescription medicine to help prevent HIV infection. If you choose to take medicine to prevent HIV, you should first get tested for HIV. You should then be tested every 3 months for as long as you are taking the medicine. Pregnancy If you are about to stop having your period (premenopausal) and you may become pregnant, seek counseling before you get pregnant. Take 400 to 800 micrograms (mcg) of folic acid every day if you become pregnant. Ask for birth control (contraception) if you want to prevent pregnancy. Osteoporosis and menopause Osteoporosis is a disease in which the bones lose minerals and strength with  aging. This can result in bone fractures. If you are 19 years old or older, or if you are at risk for osteoporosis and fractures, ask your health care provider if you should: Be screened for bone loss. Take a calcium or vitamin D supplement to lower your risk of fractures. Be given hormone replacement therapy (HRT) to treat symptoms of menopause. Follow these instructions at home: Alcohol use Do not drink alcohol if: Your health care provider tells you not to drink. You are pregnant, may be pregnant, or are planning to become pregnant. If you drink alcohol: Limit how much you have to: 0-1 drink a day. Know how much alcohol is in your drink. In the [place] one drink equals one 12 oz bottle of beer (355 mL), one 5 oz glass of wine (148 mL), or one 1 oz glass of hard liquor (44 mL). Lifestyle Do not use any products that contain nicotine or tobacco. These products  include cigarettes, chewing tobacco, and vaping devices, such as e-cigarettes. If you need help quitting, ask your health care provider. Do not use street drugs. Do not share needles. Ask your health care provider for help if you need support or information about quitting drugs. General instructions Schedule regular health, dental, and eye exams. Stay current with your vaccines. Tell your health care provider if: You often feel depressed. You have ever been abused or do not feel safe at home. Summary Adopting a healthy lifestyle and getting preventive care are important in promoting health and wellness. Follow your health care provider's instructions about healthy diet, exercising, and getting tested or screened for diseases. Follow your health care provider's instructions on monitoring your cholesterol and blood pressure. This information is not intended to replace advice given to you by your health care provider. Make sure you discuss any questions you have with your health care provider. Document Revised: 04/28/2021 Document Reviewed: 04/28/2021 Elsevier Patient Education  2023 [employer].

## 2023-03-30 NOTE — Progress Notes (Signed)
MEDICARE ANNUAL WELLNESS VISIT  03/30/2023  Telephone Visit Disclaimer This Medicare AWV was conducted by telephone due to national recommendations for restrictions regarding the COVID-19 Pandemic (e.g. social distancing).  I verified, using two identifiers, that I am speaking with Bonnie [name] or their authorized healthcare agent. I discussed the limitations, risks, security, and privacy concerns of performing an evaluation and management service by telephone and the potential availability of an in-person appointment in the future. The patient expressed understanding and agreed to proceed.  Location of Patient: Home Location of Provider (nurse):  In the office.  Subjective:    Bonnie [name] is a 72 y.o. female patient of Everrett Coombe, DO who had a Medicare Annual Wellness Visit today via telephone. Bonnie [name] is Retired and lives with their spouse. she has 3 children. she reports that she is socially active and does interact with friends/family regularly. she is minimally physically active and enjoys gardening and reading. .  Patient Care Team: Everrett Coombe, DO as PCP - General (Family Medicine) Charlett Nose, DPM (Inactive) as Consulting Physician (Podiatry) Crista Elliot, MD as Consulting Physician (Urology) Randolm Idol, MD as Consulting Physician (Pulmonary Disease) Armbruster, Willaim Rayas, MD as Consulting Physician (Gastroenterology) Lewayne Bunting, MD as Consulting Physician (Cardiology) Armbruster, Willaim Rayas, MD as Consulting Physician (Gastroenterology)     03/30/2023    9:46 AM 03/01/2020   12:00 PM 02/16/2020   12:31 PM 10/27/2019   12:03 PM 09/05/2019    8:13 AM  Advanced Directives  Does Patient Have a Medical Advance Directive? Yes No No No Yes  Type of Advance Directive Living will    Living will  Does patient want to make changes to medical advance directive? No - Patient declined    No - Patient declined  Would patient like information on creating a  medical advance directive?  No - Patient declined  No - Patient declined     Hospital Utilization Over the Past 12 Months: # of hospitalizations or ER visits: 0 # of surgeries: 0  Review of Systems    Patient reports that her overall health is unchanged compared to last year.  History obtained from chart review and the patient  Patient Reported Readings (BP, Pulse, CBG, Weight, etc) none  Pain Assessment Pain : No/denies pain     Current Medications & Allergies (verified) Allergies as of 03/30/2023       Reactions   Latex Itching, Rash   Metformin And Related Other (See Comments)   Malaise, fatigue   Morphine And Related Rash, Other (See Comments)   Agitation   Tape Swelling, Other (See Comments)   Burning sensation   Tramadol Palpitations   Heart fluttering        Medication List        Accurate as of March 30, 2023 10:01 AM. If you have any questions, ask your nurse or doctor.          ALPRAZolam 0.25 MG tablet Commonly known as: XANAX Take 1-2 tablets (0.25-0.5 mg total) by mouth 2 (two) times daily as needed for anxiety or sleep.   aspirin 81 MG chewable tablet Chew by mouth daily.   atorvastatin 40 MG tablet Commonly known as: LIPITOR Take 1 tablet (40 mg total) by mouth daily.   blood glucose meter kit and supplies Kit Check morning fasting blood glucose and up to four times daily as directed.   buPROPion 150 MG 24 hr tablet Commonly known as: WELLBUTRIN XL  TAKE 1 TABLET BY MOUTH EVERY DAY IN THE MORNING   dapagliflozin propanediol 10 MG Tabs tablet Commonly known as: Farxiga Take 1 tablet (10 mg total) by mouth daily before breakfast.   enalapril 10 MG tablet Commonly known as: VASOTEC Take 1 tablet (10 mg total) by mouth daily.   Fish Oil 1000 MG Caps Take 1,000 mg by mouth daily.   fluticasone 50 MCG/ACT nasal spray Commonly known as: FLONASE Place 2 sprays into both nostrils daily as needed for allergies or rhinitis.    HYDROcodone-acetaminophen 5-325 MG tablet Commonly known as: NORCO/VICODIN Take 1 tablet by mouth every 8 (eight) hours as needed for moderate pain or severe pain.   metoCLOPramide 10 MG tablet Commonly known as: REGLAN Take 10 mg when pick up Rx and 10 mg 30-60 mins before next colonoscopy prep session   Multiple Vitamin tablet Take 1 tablet by mouth daily.   OneTouch Delica Plus Lancet33G Misc USE AS DIRECTED UP TO 4    TIMES DAILY   OneTouch Verio test strip Generic drug: glucose blood USE UP TO 4 TIMES A DAY AS DIRECTED WITH GLUCOMETER   Rybelsus 14 MG Tabs Generic drug: Semaglutide TAKE 1 TABLET DAILY   traZODone 50 MG tablet Commonly known as: DESYREL Take 1 tablet (50 mg total) by mouth at bedtime.   triamterene-hydrochlorothiazide 37.5-25 MG tablet Commonly known as: MAXZIDE-25 Take 1 tablet by mouth daily.   venlafaxine XR 75 MG 24 hr capsule Commonly known as: EFFEXOR-XR Take 1 capsule (75 mg total) by mouth daily.   Vitamin D3 50 MCG (2000 UT) Tabs Generic drug: Cholecalciferol Take 2,000 Units by mouth daily.        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   Chronic low back pain    Colon polyps    Depression    Diabetes mellitus without complication    Fatty liver    Hypertension    Melanoma    R upper extremity   Melanoma in situ of right upper extremity    Mild diastolic dysfunction 07/09/2019   Osteoarthritis of lumbar spine    Renal cancer    Skin cancer    Sleep apnea 2008   cpap nightly   Past Surgical History:  Procedure Laterality Date   ABDOMINAL HYSTERECTOMY     ANKLE SURGERY Left    in her 30s   BILE DUCT STENT PLACEMENT     CARDIAC CATHETERIZATION      CHOLECYSTECTOMY     COLONOSCOPY     FRACTURE SURGERY  2005   Back, Ankle in 1980's   JOINT REPLACEMENT  2021   Left Hip   LEFT HEART CATH AND CORONARY ANGIOGRAPHY N/A 09/05/2019   Procedure: LEFT HEART CATH AND CORONARY ANGIOGRAPHY;  Surgeon: Marykay Lex, MD;  Location: [facility] INVASIVE CV LAB;  Service: Cardiovascular;  Laterality: N/A;   LUMBAR FUSION     7 level    MOHS SURGERY     x 11   PARTIAL NEPHRECTOMY     POLYPECTOMY     SPINE SURGERY  2005   7 fused vertebra   TOTAL HIP ARTHROPLASTY Left 03/01/2020   Procedure: LEFT TOTAL HIP ARTHROPLASTY ANTERIOR  APPROACH;  Surgeon: Kathryne Hitch, MD;  Location: [facility];  Service: Orthopedics;  Laterality: Left;   UPPER GASTROINTESTINAL ENDOSCOPY     Family History  Problem Relation Age of Onset   Depression Mother    Hyperlipidemia Mother    Hypertension Mother    Colon polyps Mother    Kidney disease Mother    Obesity Mother    Heart attack Father    Hyperlipidemia Father    Hypertension Father    Colon polyps Father    Alcohol abuse Sister    Depression Sister    Alcohol abuse Son    Drug abuse Son    Alcohol abuse Son    Drug abuse Son    Alcohol abuse Sister    Depression Sister    Drug abuse Sister    Stomach cancer Brother    Cancer Brother    Alcohol abuse Sister    Drug abuse Sister    Early death Sister    Colon cancer Neg Hx    Esophageal cancer Neg Hx    Rectal cancer Neg Hx    Social History   Socioeconomic History   Marital status: Married    Spouse name: Darrell   Number of children: 3   Years of education: 12   Highest education level: 12th grade  Occupational History   Occupation: Retired  Tobacco Use   Smoking status: Former    Packs/day: 1.00    Years: 40.00    Additional pack years: 0.00    Total pack years: 40.00    Types: Cigarettes    Quit date: 12/21/2010    Years since quitting: 12.2   Smokeless tobacco: Never  Vaping Use   Vaping Use: Former   Devices: 2012-2014 while  quitting cigaretter  Substance and Sexual Activity   Alcohol use: Not Currently    Alcohol/week: 1.0 - 2.0 standard drink of alcohol    Types: 1 - 2 Standard drinks or equivalent per week   Drug use: Yes    Frequency: 3.0 times per week    Types: Hydrocodone   Sexual activity: Not Currently    Birth control/protection: Surgical, None  Other Topics Concern   Not on file  Social History Narrative   Lives with her husband. She has two daughters and one son. She enjoys gardening and reading.    Social Determinants of Health   Financial Resource Strain: Low Risk  (03/29/2023)   Overall Financial Resource Strain (CARDIA)    Difficulty of Paying Living Expenses: Not hard at all  Food Insecurity: No Food Insecurity (03/29/2023)   Hunger Vital Sign    Worried About Running Out of Food in the Last Year: Never true    Ran Out of Food in the Last Year: Never true  Transportation Needs: No Transportation Needs (03/29/2023)   PRAPARE - Administrator, Civil Service (Medical): No    Lack of Transportation (Non-Medical): No  Physical Activity: Insufficiently Active (03/29/2023)   Exercise Vital Sign    Days of Exercise per Week: 1 day    Minutes of Exercise per Session: 30 min  Stress: No Stress Concern Present (03/29/2023)   [employer] of Occupational Health - Occupational Stress Questionnaire    Feeling of Stress : Only a little  Social Connections: Socially Isolated (03/30/2023)   Social Connection and Isolation Panel [NHANES]    Frequency of Communication with Friends and Family: Twice a week    Frequency of Social Gatherings with  Friends and Family: Never    Attends Religious Services: Never    Database administrator or Organizations: No    Attends Banker Meetings: Never    Marital Status: Married    Activities of Daily Living    03/29/2023    4:31 PM 03/26/2023    8:51 AM  In your present state of health, do you have any difficulty performing the following  activities:  Hearing? 0 0  Vision? 0 0  Difficulty concentrating or making decisions? 0 0  Walking or climbing stairs? 0 0  Dressing or bathing? 0 0  Doing errands, shopping? 0 0  Preparing Food and eating ? N N  Using the Toilet? N N  In the past six months, have you accidently leaked urine? N N  Do you have problems with loss of bowel control? N N  Managing your Medications? N N  Managing your Finances? N N  Housekeeping or managing your Housekeeping? N N    Patient Education/ Literacy How often do you need to have someone help you when you read instructions, pamphlets, or other written materials from your doctor or pharmacy?: 1 - Never What is the last grade level you completed in school?: one year of college  Exercise Current Exercise Habits: Home exercise routine, Type of exercise: walking, Time (Minutes): 30, Frequency (Times/Week): 1, Weekly Exercise (Minutes/Week): 30, Intensity: Mild, Exercise limited by: orthopedic condition(s)  Diet Patient reports consuming 3 meals a day and 1-2 snack(s) a day Patient reports that her primary diet is: Regular Patient reports that she does have regular access to food.   Depression Screen    03/30/2023    9:47 AM 12/22/2022   11:06 AM 08/25/2022   11:43 AM 08/25/2022   11:25 AM 05/22/2022    9:49 AM 01/28/2022   10:38 AM 10/28/2021    9:26 AM  PHQ 2/9 Scores  PHQ - 2 Score 0 0 0 0 0 0 0  PHQ- 9 Score  1 4  3 4 4      Fall Risk    03/30/2023    9:15 AM 03/29/2023    4:31 PM 03/26/2023    8:51 AM 12/22/2022   11:06 AM 08/25/2022   11:25 AM  Fall Risk   Falls in the past year? 0 0 0 0 1  Number falls in past yr: 0   0 0  Injury with Fall? 0   0 1  Risk for fall due to : No Fall Risks   No Fall Risks History of fall(s)  Follow up Falls evaluation completed   Falls evaluation completed Falls evaluation completed     Objective:  Bonnie [name] seemed alert and oriented and she participated appropriately during our telephone visit.  Blood  Pressure Weight BMI  BP Readings from Last 3 Encounters:  01/21/23 121/69  12/22/22 122/73  09/23/22 (!) 140/92   Wt Readings from Last 3 Encounters:  01/21/23 (!) 305 lb (138.3 kg)  01/04/23 (!) 305 lb (138.3 kg)  12/22/22 (!) 306 lb 1.9 oz (138.9 kg)   BMI Readings from Last 1 Encounters:  01/21/23 45.04 kg/m    *Unable to obtain current vital signs, weight, and BMI due to telephone visit type  Hearing/Vision  Bonnie [name] did not seem to have difficulty with hearing/understanding during the telephone conversation Reports that she has had a formal eye exam by an eye care professional within the past year Reports that she has not had a formal hearing  evaluation within the past year *Unable to fully assess hearing and vision during telephone visit type  Cognitive Function:    03/30/2023    9:54 AM  6CIT Screen  What Year? 0 points  What month? 0 points  What time? 0 points  Count back from 20 0 points  Months in reverse 0 points  Repeat phrase 0 points  Total Score 0 points   (Normal:0-7, Significant for Dysfunction: >8)  Normal Cognitive Function Screening: Yes   Immunization & Health Maintenance Record Immunization History  Administered Date(s) Administered   COVID-19, mRNA, vaccine(Comirnaty)12 years and older 09/12/2022   Fluad Quad(high Dose 65+) 08/08/2019   Influenza Split 07/23/2020   Influenza,inj,Quad PF,6+ Mos 10/04/2018   Influenza-Unspecified 08/23/2020, 09/23/2021   Moderna Sars-Covid-2 Vaccination 01/16/2020, 02/13/2020, 10/15/2020, 05/01/2021   Pfizer Covid-19 Vaccine Bivalent Booster 49yrs & up 09/23/2021   Pneumococcal Conjugate-13 05/19/2017   Pneumococcal Polysaccharide-23 10/19/2011, 07/06/2018   Tdap 05/22/2022   Zoster Recombinat (Shingrix) 07/28/2018, 10/06/2018   Zoster, Live 02/18/2014    Health Maintenance  Topic Date Due   COVID-19 Vaccine (7 - 2023-24 season) 01/08/2024 (Originally 11/07/2022)   Lung Cancer Screening  03/29/2024  (Originally 08/23/2021)   FOOT EXAM  05/23/2023   HEMOGLOBIN A1C  06/24/2023   INFLUENZA VACCINE  07/22/2023   Diabetic kidney evaluation - eGFR measurement  12/25/2023   Diabetic kidney evaluation - Urine ACR  12/25/2023   OPHTHALMOLOGY EXAM  03/24/2024   Medicare Annual Wellness (AWV)  03/29/2024   MAMMOGRAM  07/22/2024   COLONOSCOPY (Pts 45-73yrs Insurance coverage will need to be confirmed)  01/22/2028   DTaP/Tdap/Td (2 - Td or Tdap) 05/22/2032   Pneumonia Vaccine 18+ Years old  Completed   DEXA SCAN  Completed   Hepatitis C Screening  Completed   Zoster Vaccines- Shingrix  Completed   HPV VACCINES  Aged Out       Assessment  This is a routine wellness examination for Bonnie [name].  Health Maintenance: Due or Overdue There are no preventive care reminders to display for this patient.   Bonnie [name] does not need a referral for Community Assistance: Care Management:   no Social Work:    no Prescription Assistance:  no Nutrition/Diabetes Education:  no   Plan:  Personalized Goals  Goals Addressed               This Visit's Progress     Patient Stated (pt-stated)        Patient stated that she would like to continue to work on her weight.       Personalized Health Maintenance & Screening Recommendations  Bone densitometry screening  Lung Cancer Screening Recommended: yes; patient will call to schedule at [facility] (Low Dose CT Chest recommended if Age 39-80 years, 20 pack-year currently smoking OR have quit w/in past 15 years) Hepatitis C Screening recommended: no HIV Screening recommended: no  Advanced Directives: Written information was not prepared per patient's request.  Referrals & Orders Orders Placed This Encounter  Procedures   DEXAScan    Follow-up Plan Follow-up with Everrett Coombe, DO as planned Schedule lung cancer screening.  Medicare wellness visit in one year.  Patient will access AVS on my chart.   I have personally reviewed and  noted the following in the patient's chart:   Medical and social history Use of alcohol, tobacco or illicit drugs  Current medications and supplements Functional ability and status Nutritional status Physical activity Advanced directives List of other physicians  Hospitalizations, surgeries, and ER visits in previous 12 months Vitals Screenings to include cognitive, depression, and falls Referrals and appointments  In addition, I have reviewed and discussed with Bonnie [name] certain preventive protocols, quality metrics, and best practice recommendations. A written personalized care plan for preventive services as well as general preventive health recommendations is available and can be mailed to the patient at her request.      [place] CharonBableen  Miqueas Whilden, RN BSN  03/30/2023

## 2023-04-08 ENCOUNTER — Encounter: Payer: Self-pay | Admitting: Family Medicine

## 2023-04-09 ENCOUNTER — Other Ambulatory Visit: Payer: Self-pay | Admitting: Family Medicine

## 2023-04-09 DIAGNOSIS — E1169 Type 2 diabetes mellitus with other specified complication: Secondary | ICD-10-CM

## 2023-04-09 DIAGNOSIS — I2510 Atherosclerotic heart disease of native coronary artery without angina pectoris: Secondary | ICD-10-CM

## 2023-04-09 DIAGNOSIS — E1165 Type 2 diabetes mellitus with hyperglycemia: Secondary | ICD-10-CM

## 2023-04-09 MED ORDER — ATORVASTATIN CALCIUM 80 MG PO TABS: 80.0000 mg | ORAL_TABLET | Freq: Every day | ORAL | 1 refills | Status: DC

## 2023-04-22 ENCOUNTER — Encounter: Payer: Self-pay | Admitting: Family Medicine

## 2023-04-22 DIAGNOSIS — M47816 Spondylosis without myelopathy or radiculopathy, lumbar region: Secondary | ICD-10-CM

## 2023-04-23 NOTE — Telephone Encounter (Signed)
Referral entered.   CM

## 2023-04-26 ENCOUNTER — Other Ambulatory Visit: Payer: Self-pay | Admitting: Neurosurgery

## 2023-04-26 DIAGNOSIS — G8929 Other chronic pain: Secondary | ICD-10-CM

## 2023-04-28 NOTE — Progress Notes (Signed)
Referring-Bonnie Jerolyn Center, DO Reason for referral-coronary artery disease  HPI: 72 year old female for evaluation of coronary artery disease at request of Milford Cage, DO.  Patient seen previously but not since August 2020.  Echocardiogram July 2020 showed ejection fraction greater than 65%, grade 1 diastolic dysfunction.  Cardiac catheterization September 2020 showed less than 20% LAD and otherwise normal coronary arteries; normal LVEDP.  Echocardiogram October 2023 showed normal LV function.  Also noted to have aortic atherosclerosis on CT scan.  Cardiology now asked to reassess.  Patient has dyspnea with more vigorous activities but not routine activities.  No orthopnea, PND, chest pain or syncope.  Occasional minimal pedal edema.  Current Outpatient Medications  Medication Sig Dispense Refill   ALPRAZolam (XANAX) 0.25 MG tablet Take 1-2 tablets (0.25-0.5 mg total) by mouth 2 (two) times daily as needed for anxiety or sleep. 30 tablet 3   aspirin 81 MG chewable tablet Chew by mouth daily.     atorvastatin (LIPITOR) 80 MG tablet Take 1 tablet (80 mg total) by mouth daily. 90 tablet 1   blood glucose meter kit and supplies KIT Check morning fasting blood glucose and up to four times daily as directed. 1 each 0   buPROPion (WELLBUTRIN XL) 150 MG 24 hr tablet TAKE 1 TABLET BY MOUTH EVERY DAY IN THE MORNING 90 tablet 2   Cholecalciferol (VITAMIN D3) 50 MCG (2000 UT) TABS Take 2,000 Units by mouth daily. 90 tablet 3   dapagliflozin propanediol (FARXIGA) 10 MG TABS tablet Take 1 tablet (10 mg total) by mouth daily before breakfast. 90 tablet 3   enalapril (VASOTEC) 10 MG tablet Take 1 tablet (10 mg total) by mouth daily. 90 tablet 2   fluticasone (FLONASE) 50 MCG/ACT nasal spray Place 2 sprays into both nostrils daily as needed for allergies or rhinitis.      HYDROcodone-acetaminophen (NORCO/VICODIN) 5-325 MG tablet Take 1 tablet by mouth every 8 (eight) hours as needed for moderate pain or severe  pain. 60 tablet 0   Lancets (ONETOUCH DELICA PLUS LANCET33G) MISC USE AS DIRECTED UP TO 4    TIMES DAILY 100 each 14   Multiple Vitamin tablet Take 1 tablet by mouth daily.      Omega-3 Fatty Acids (FISH OIL) 1000 MG CAPS Take 1,000 mg by mouth daily.      ONETOUCH VERIO test strip USE UP TO 4 TIMES A DAY AS DIRECTED WITH GLUCOMETER 100 strip 14   RYBELSUS 14 MG TABS TAKE 1 TABLET DAILY 90 tablet 0   traZODone (DESYREL) 50 MG tablet Take 1 tablet (50 mg total) by mouth at bedtime. 90 tablet 2   triamterene-hydrochlorothiazide (MAXZIDE-25) 37.5-25 MG tablet Take 1 tablet by mouth daily. 90 tablet 3   venlafaxine XR (EFFEXOR-XR) 75 MG 24 hr capsule Take 1 capsule (75 mg total) by mouth daily. 90 capsule 3   No current facility-administered medications for this visit.    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   Chronic low back pain    Colon polyps    Depression    Diabetes mellitus without complication (HCC)    Fatty liver    Hypertension    Melanoma (HCC)    R upper extremity   Melanoma in situ of right upper extremity (HCC)    Mild diastolic dysfunction 07/09/2019   Osteoarthritis of lumbar spine    Renal cancer (HCC)    Skin cancer    Sleep apnea 2008   cpap nightly    Past Surgical History:  Procedure Laterality Date   ABDOMINAL HYSTERECTOMY     ANKLE SURGERY Left    in her 30s   BILE DUCT STENT PLACEMENT     CARDIAC  CATHETERIZATION     CHOLECYSTECTOMY     COLONOSCOPY     FRACTURE SURGERY  2005   Back, Ankle in 1980's   JOINT REPLACEMENT  2021   Left Hip   LEFT HEART CATH AND CORONARY ANGIOGRAPHY N/A 09/05/2019   Procedure: LEFT HEART CATH AND CORONARY ANGIOGRAPHY;  Surgeon: Marykay Lex, MD;  Location: [name];  Service: Cardiovascular;  Laterality: N/A;   LUMBAR FUSION     7 level    MOHS SURGERY     x 11   PARTIAL NEPHRECTOMY     POLYPECTOMY     SPINE SURGERY  2005   7 fused vertebra   TOTAL HIP ARTHROPLASTY Left 03/01/2020   Procedure: LEFT TOTAL HIP ARTHROPLASTY ANTERIOR APPROACH;  Surgeon: Kathryne Hitch, MD;  Location: [facility];  Service: Orthopedics;  Laterality: Left;   UPPER GASTROINTESTINAL ENDOSCOPY      Social History   Socioeconomic History   Marital status: Married    Spouse name: Darrell   Number of children: 3   Years of education: 12   Highest education level: 12th grade  Occupational History   Occupation: Retired  Tobacco Use   Smoking status: Former    Packs/day: 1.00    Years: 40.00    Additional pack years: 0.00    Total pack years: 40.00    Types: Cigarettes    Quit date: 12/21/2010    Years since quitting: 12.3   Smokeless tobacco: Never  Vaping Use   Vaping Use: Former   Devices: 2012-2014 while quitting cigaretter  Substance and Sexual Activity   Alcohol use: Never    Alcohol/week: 1.0 - 2.0 standard drink of alcohol    Types: 1 - 2 Standard drinks or equivalent per week   Drug use: Yes    Frequency: 3.0 times per week    Types: Hydrocodone   Sexual activity: Not Currently    Birth control/protection: Surgical, None  Other Topics Concern   Not on file  Social History Narrative   Lives with her husband. She has two daughters and one son. She enjoys gardening and reading.    Social Determinants of Health   Financial Resource Strain: Low Risk  (03/29/2023)   Overall Financial Resource Strain (CARDIA)    Difficulty of Paying  Living Expenses: Not hard at all  Food Insecurity: No Food Insecurity (03/29/2023)   Hunger Vital Sign    Worried About Running Out of Food in the Last Year: Never true    Ran Out of Food in the Last Year: Never true  Transportation Needs: No Transportation Needs (03/29/2023)  PRAPARE - Administrator, Civil Service (Medical): No    Lack of Transportation (Non-Medical): No  Physical Activity: Insufficiently Active (03/29/2023)   Exercise Vital Sign    Days of Exercise per Week: 1 day    Minutes of Exercise per Session: 30 min  Stress: No Stress Concern Present (03/29/2023)   [employer] of Occupational Health - Occupational Stress Questionnaire    Feeling of Stress : Only a little  Social Connections: Socially Isolated (03/30/2023)   Social Connection and Isolation Panel [NHANES]    Frequency of Communication with Friends and Family: Twice a week    Frequency of Social Gatherings with Friends and Family: Never    Attends Religious Services: Never    Database administrator or Organizations: No    Attends Banker Meetings: Never    Marital Status: Married  Catering manager Violence: Not At Risk (03/30/2023)   Humiliation, Afraid, Rape, and Kick questionnaire    Fear of Current or Ex-Partner: No    Emotionally Abused: No    Physically Abused: No    Sexually Abused: No    Family History  Problem Relation Age of Onset   Depression Mother    Hyperlipidemia Mother    Hypertension Mother    Colon polyps Mother    Kidney disease Mother    Obesity Mother    Heart attack Father    Hyperlipidemia Father    Hypertension Father    Colon polyps Father    Alcohol abuse Sister    Depression Sister    Alcohol abuse Son    Drug abuse Son    Alcohol abuse Son    Drug abuse Son    Alcohol abuse Sister    Depression Sister    Drug abuse Sister    Stomach cancer Brother    Cancer Brother    Alcohol abuse Sister    Drug abuse Sister    Early death Sister    Colon  cancer Neg Hx    Esophageal cancer Neg Hx    Rectal cancer Neg Hx     ROS: Back pain but no fevers or chills, productive cough, hemoptysis, dysphasia, odynophagia, melena, hematochezia, dysuria, hematuria, rash, seizure activity, orthopnea, PND, pedal edema, claudication. Remaining systems are negative.  Physical Exam:   Blood pressure 124/76, pulse 88, height 5\' 9"  (1.753 m), weight 297 lb 12.8 oz (135.1 kg), SpO2 92 %.  General:  Well developed/obese in NAD Skin warm/dry Patient not depressed No peripheral clubbing Back-normal HEENT-normal/normal eyelids Neck supple/normal carotid upstroke bilaterally; no bruits; no JVD; no thyromegaly chest - CTA/ normal expansion CV - RRR/normal S1 and S2; no murmurs, rubs or gallops;  PMI nondisplaced Abdomen -NT/ND, no HSM, no mass, + bowel sounds, no bruit 2+ femoral pulses, no bruits Ext-no edema, chords, 2+ DP Neuro-grossly nonfocal  ECG -normal sinus rhythm, right bundle branch block.  Personally reviewed  A/P  1 coronary artery disease-minimal on previous catheterization.  Continue aspirin and statin.  2 history of dyspnea-this is felt to be multifactorial including obstructive sleep apnea, obesity hypoventilation syndrome and deconditioning.  Previous cardiac catheterization revealed minimal coronary disease and normal LVEDP.  3 history of palpitations-not problematic at present.  Will consider beta-blockade in the future if necessary.  4 hypertension-blood pressure controlled.  Continue present medical regimen.  5 hyperlipidemia-continue statin.  Recent LDL not at goal.  Add Zetia 10 mg daily.  Check lipids and liver in 8 weeks.  6 history of  aortic atherosclerosis-continue aspirin and statin.  Olga Millers, MD

## 2023-05-01 ENCOUNTER — Ambulatory Visit (INDEPENDENT_AMBULATORY_CARE_PROVIDER_SITE_OTHER): Payer: Medicare Other

## 2023-05-01 DIAGNOSIS — G8929 Other chronic pain: Secondary | ICD-10-CM

## 2023-05-01 DIAGNOSIS — M545 Low back pain, unspecified: Secondary | ICD-10-CM | POA: Diagnosis not present

## 2023-05-04 ENCOUNTER — Encounter: Payer: Self-pay | Admitting: Rehabilitative and Restorative Service Providers"

## 2023-05-04 ENCOUNTER — Ambulatory Visit: Payer: Medicare Other | Attending: Neurosurgery | Admitting: Rehabilitative and Restorative Service Providers"

## 2023-05-04 ENCOUNTER — Other Ambulatory Visit: Payer: Self-pay

## 2023-05-04 DIAGNOSIS — R2689 Other abnormalities of gait and mobility: Secondary | ICD-10-CM | POA: Diagnosis present

## 2023-05-04 DIAGNOSIS — M5459 Other low back pain: Secondary | ICD-10-CM | POA: Diagnosis present

## 2023-05-04 DIAGNOSIS — R29898 Other symptoms and signs involving the musculoskeletal system: Secondary | ICD-10-CM

## 2023-05-04 DIAGNOSIS — M6281 Muscle weakness (generalized): Secondary | ICD-10-CM

## 2023-05-04 NOTE — Therapy (Signed)
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   Chronic low back pain    Colon polyps    Depression    Diabetes mellitus without complication (HCC)    Fatty liver    Hypertension    Melanoma (HCC)    R upper extremity   Melanoma in situ of right upper extremity (HCC)    Mild diastolic dysfunction 07/09/2019   Osteoarthritis of lumbar spine    Renal cancer (HCC)    Skin cancer    Sleep apnea 2008   cpap nightly   Past Surgical History:  Procedure Laterality Date   ABDOMINAL HYSTERECTOMY     ANKLE SURGERY Left    in her 30s   BILE DUCT STENT PLACEMENT     CARDIAC CATHETERIZATION     CHOLECYSTECTOMY     COLONOSCOPY     FRACTURE SURGERY  2005   Back, Ankle in 1980's   JOINT REPLACEMENT  2021   Left Hip   LEFT HEART CATH AND CORONARY ANGIOGRAPHY N/A 09/05/2019   Procedure: LEFT HEART CATH AND CORONARY ANGIOGRAPHY;  Surgeon: Marykay Lex, MD;  Location: [facility];  Service: Cardiovascular;  Laterality: N/A;   LUMBAR FUSION     7 level    MOHS SURGERY     x 11   PARTIAL NEPHRECTOMY     POLYPECTOMY     SPINE SURGERY  2005   7 fused vertebra   TOTAL HIP ARTHROPLASTY Left 03/01/2020   Procedure: LEFT TOTAL HIP ARTHROPLASTY ANTERIOR APPROACH;  Surgeon: Kathryne Hitch, MD;  Location: [facility];  Service: Orthopedics;  Laterality: Left;   UPPER GASTROINTESTINAL ENDOSCOPY     Patient Active Problem List   Diagnosis Date Noted   Renal mass, right 12/01/2022   Left groin pain 09/22/2022   Impingement syndrome, shoulder, right  12/05/2021   Well adult exam 10/28/2021   Status post total replacement of left hip 03/01/2020   Acute kidney injury superimposed on CKD (HCC) 09/17/2019   Insomnia secondary to anxiety 09/17/2019   Increase in serum creatinine from prior measurement 09/17/2019   Abnormal nuclear stress test 09/04/2019   Mild diastolic dysfunction 07/09/2019   Right bundle branch block 07/05/2019   Sphincter of Oddi dysfunction 07/05/2019   Multiple pulmonary nodules determined by computed tomography of lung 07/05/2019   Irregular heart beats 06/29/2019   Family history of CHF (congestive heart failure) 06/29/2019   Recurrent major depressive disorder, in partial remission (HCC) 06/12/2019   History of colon polyps 01/17/2019   Primary osteoarthritis of left hip 12/19/2018   Centrilobular emphysema (HCC) 07/25/2018   Closed compression fracture of body of L1 vertebra (HCC) 07/25/2018   Renal cyst, acquired, left 07/25/2018   Primary osteoarthritis involving multiple joints 07/06/2018   History of multiple pulmonary nodules 07/06/2018   Encounter for screening for lung cancer 07/06/2018   Type 2 diabetes mellitus with hyperglycemia, without long-term current use of insulin (HCC) 04/04/2018   Dyslipidemia associated with type 2 diabetes mellitus (HCC) 04/04/2018   Class 3 severe  obesity due to excess calories with serious comorbidity and body mass index (BMI) of 40.0 to 44.9 in adult [facility]) 04/03/2018   Onychomycosis 03/28/2018   Morbid obesity (HCC) 02/28/2018   Bilateral foot pain 02/28/2018   Benign cyst of right breast 01/26/2018   Abnormal mammogram of right breast 01/20/2018   Chronic kidney disease, stage 3a (HCC) 12/31/2017   Encounter for chronic pain management 12/30/2017   Chronic use of opiate for therapeutic purpose 12/30/2017   History of melanoma 12/30/2017   History of renal cell carcinoma 12/30/2017   Arthritis of carpometacarpal (CMC) joint of left thumb 05/19/2017   Controlled  substance agreement signed 03/30/2017   H/O acute pancreatitis 07/17/2016   Coronary artery calcification seen on CT scan 11/08/2015   History of back surgery 09/23/2015   Spondylosis of lumbar region without myelopathy or radiculopathy 08/30/2015   Primary osteoarthritis of both knees 03/01/2015   Anxiety 07/24/2014   Basal cell carcinoma 10/04/2013   Depression, major 10/04/2013   Squamous cell carcinoma 10/04/2013   Dyspnea on exertion -> considered to be possible anginal equivalent 04/20/2012   Essential hypertension 03/29/2012   Former smoker 03/29/2012   Hyperlipidemia 03/29/2012   Palpitations 03/29/2012   OSA on CPAP 03/29/2012    PCP: Dr Everrett Coombe   REFERRING PROVIDER: Dr Bedelia Person   REFERRING DIAG: Lt lumbago with Lt sciatica   Rationale for Evaluation and Treatment: Rehabilitation  THERAPY DIAG:  Other low back pain  Muscle weakness (generalized)  Other symptoms and signs involving the musculoskeletal system  Other abnormalities of gait and mobility  ONSET DATE: 02/19/23  SUBJECTIVE:                                                                                                                                                                                           SUBJECTIVE STATEMENT: Patient reports that she broke her back in 2005 with 7 fused levels in the thoracic lumbar spine. She has pain in the LB as well as fatigue in the neck and shoulders with functional activities. She has difficulty with progressively worsening pain in the LB. She will have "nerve twinges" in the LB at times - sometimes when she when she turns over at night. She does not have nerve pain in her legs. She feels she uses her LB more since she is fused in the mid back. Will see MD tomorrow for follow up.   PERTINENT HISTORY:  LBP for years; fx of one vertebra with fusion of that level and 3 vertebras above and belove level of fracture 2005; ablation 2021; ESI x 2 2023; kidney  disease;  obesity; depression; AODM; HTN; melanoma; osteoarthritis; sleep apnea   PAIN:  Are you having pain? Yes: NPRS scale: 1-2/10 Pain location: middle of LB Pain description: nagging;sharp at times; tightening Aggravating factors: walking  Relieving factors: sitting; lying down   PRECAUTIONS: None  WEIGHT BEARING RESTRICTIONS: No  FALLS:  Has patient fallen in last 6 months? No  LIVING ENVIRONMENT: Lives with: lives with their spouse Lives in: House/apartment Stairs: No Has following equipment at home: None  OCCUPATION: retired from Academic librarian   PLOF: Independent  PATIENT GOALS: decrease pain and gain strength in back and upper body   NEXT MD VISIT: 05/05/23  OBJECTIVE:   DIAGNOSTIC FINDINGS:  Xrays - DDD lumbar spine   PATIENT SURVEYS:  FOTO 37; goal 48  SCREENING FOR RED FLAGS: Bowel or bladder incontinence: No Spinal tumors: No Cauda equina syndrome: No Compression fracture: No Abdominal aneurysm: No  COGNITION: Overall cognitive status: Within functional limits for tasks assessed     SENSATION: Across LB but not in LE's   SINGLE LEG STANCE:   Lt LE 5 sec  Rt 1 sec   MUSCLE LENGTH: Hamstrings: Right 80 deg; Left 80 deg Thomas test: Right -10 to 15 deg; Left -10 to 15 deg  POSTURE: rounded shoulders, forward head, increased thoracic kyphosis, and flexed trunk   PALPATION: Muscular tightness lateral sacral area Lt > Rt; Lt QL/lumbar paraspinals  LUMBAR ROM:   AROM eval  Flexion 90%  Extension 35%  Right lateral flexion 50% pulling Lt trunk  Left lateral flexion 50% pinching Lt trunk   Right rotation 20%  Left rotation 20%   (Blank rows = not tested)  LOWER EXTREMITY ROM:  bilat LE ROM limited end ranges by obesity    Active  Right eval Left eval  Hip flexion     Hip extension -10 to 15 degrees -10 to 15 degrees   Hip abduction    Hip adduction    Hip internal rotation    Hip external rotation    Knee flexion     Knee extension    Ankle dorsiflexion    Ankle plantarflexion    Ankle inversion    Ankle eversion     (Blank rows = not tested)  LOWER EXTREMITY MMT:    MMT Right eval Left eval  Hip flexion 4 4-  Hip extension 3- 2+  Hip abduction 3+ 3-  Hip adduction    Hip internal rotation    Hip external rotation    Knee flexion 4+ 4  Knee extension 4 4  Ankle dorsiflexion    Ankle plantarflexion    Ankle inversion    Ankle eversion     (Blank rows = not tested)  LUMBAR SPECIAL TESTS:  Straight leg raise test: Negative and Slump test: Negative  FUNCTIONAL TESTS:  5 times sit to stand: 16.03 sec   GAIT: Distance walked: 40 ft  Assistive device utilized: None Level of assistance: Complete Independence Comments: flexed forward at hips; wt shift laterally with wt bearing Rt/Lt (wobbly gait)  [facility] Adult PT Treatment:                                                DATE: 05/04/23 Therapeutic Exercise: Prone  Prop on elbows ~ 1 min x 3  Attempt at press up as tolerated - unable to lift  onto UE's  Glut set 10 sec x 10 Supine  4 part core 10 sec x 10 (requires additional work)  Hip flexor stretch Thomas position - pulling bilat LE's to chest then dropping one off edge of table 30 sec x 2 Rt/Lt  Sitting  Sit to stand x 5 focus on standing to full extension  Standing  Trunk extension to pt tolerance 2-3 sec hold x 3    Manual Therapy:  Neuromuscular re-ed: Discussed importance of standing with core engaged and straightening trunk and hips  Therapeutic Activity: Patient will stand for the duration of commercials during a 30 min TV program 2 x/day    PATIENT EDUCATION:  Education details: POC; HEP  Person educated: Patient Education method: Explanation, Demonstration, Tactile cues, Verbal cues, and Handouts Education comprehension: verbalized understanding, returned demonstration, verbal cues required, tactile cues required, and needs further education  HOME EXERCISE  PROGRAM: Access Code: [email or web address] URL: [email or web address] Date: 05/04/2023 Prepared by: Corlis Leak  Exercises - Prone Press Up On Elbows  - 2 x daily - 7 x weekly - 1 sets - 3 reps - 10-20 sec  hold - Standing Back Extension  - 2 x daily - 7 x weekly - 1 sets - 3 reps - 2-3 sec  hold - Prone Gluteal Sets  - 2 x daily - 7 x weekly - 1 sets - 10 reps - 10 sec  hold - Supine Transversus Abdominis Bracing with Pelvic Floor Contraction  - 2 x daily - 7 x weekly - 1 sets - 10 reps - 10sec  hold - Hip Flexor Stretch at Edge of Bed  - 2 x daily - 7 x weekly - 1 sets - 3 reps - 30 sec  hold - Sit to Stand  - 2 x daily - 7 x weekly - 1 sets - 10 reps - 3-5 sec  hold  ASSESSMENT:  CLINICAL IMPRESSION: Patient is a 72 y.o. female who was seen today for physical therapy evaluation and treatment for low back pain with some associated "nerve twinges" in the sacral area but none into LE's. She has a long standing history of lumbar dysfunction including pain following spinal fracture when she fell backward landing on her back in 2005. She has seven level spinal fusion with associated pain; abnormal movement patterns; core weakness; LE weakness; generalized deconditioning; pain limiting functional activities. She will benefit from physical therapy to address problems identified.   OBJECTIVE IMPAIRMENTS: decreased activity tolerance, decreased balance, decreased endurance, decreased mobility, difficulty walking, decreased ROM, decreased strength, hypomobility, increased fascial restrictions, impaired flexibility, improper body mechanics, postural dysfunction, obesity, and pain.   ACTIVITY LIMITATIONS: carrying, lifting, bending, sitting, standing, squatting, sleeping, stairs, transfers, and reach over head  PARTICIPATION LIMITATIONS: meal prep, cleaning, laundry, driving, shopping, and occupation  PERSONAL FACTORS: Age, Behavior pattern, Fitness, Past/current experiences, Time since onset of  injury/illness/exacerbation, and comorbidities: obesity, sedentary lifestyle; chronic kidney disease; AODM; depression; osteoporosis are also affecting patient's functional outcome.   REHAB POTENTIAL: Good  CLINICAL DECISION MAKING: Stable/uncomplicated  EVALUATION COMPLEXITY: Low   GOALS: Goals reviewed with patient? Yes  SHORT TERM GOALS: Target date: 06/15/2023   Independent in initial HEP  Baseline: Goal status: INITIAL  2.  Increase standing tolerance to 5-10 min  Baseline:  Goal status: INITIAL  LONG TERM GOALS: Target date: 07/27/2023   Improve hip extension to neutral to (+) 10 degrees  Baseline:  Goal status: INITIAL  2.  Increase LE strength to 4/5 to 5/5 in  areas of weakness  Baseline:  Goal status: INITIAL  3.  Improve gait pattern with patient to demonstrated improved upright posture and alignment with more normalized gait pattern Baseline:  Goal status: INITIAL  4.  Increase standing tolerance to 10-15 min for functional activities  Baseline:  Goal status: INITIAL  5.  Independent in HEP including aquatic therapy program as indicated  Baseline:  Goal status: INITIAL  6.  Improve functional limitation score to 48  Baseline:  Goal status: INITIAL  PLAN:  PT FREQUENCY: 2x/week  PT DURATION: 12 weeks  PLANNED INTERVENTIONS: Therapeutic exercises, Therapeutic activity, Neuromuscular re-education, Balance training, Gait training, Patient/Family education, Self Care, Joint mobilization, Stair training, Aquatic Therapy, Dry Needling, Electrical stimulation, Spinal mobilization, Cryotherapy, Moist heat, Taping, Ultrasound, Ionotophoresis 4mg /ml Dexamethasone, Manual therapy, and Re-evaluation.  PLAN FOR NEXT SESSION: review and progress exercises; progress with hip flexor stretching and core/LR strengthening; manual work, DN, modalities as indicated    [employer], PT 05/04/2023, 4:23 PM

## 2023-05-06 ENCOUNTER — Encounter: Payer: Self-pay | Admitting: Cardiology

## 2023-05-06 ENCOUNTER — Ambulatory Visit: Payer: Medicare Other | Attending: Cardiology | Admitting: Cardiology

## 2023-05-06 VITALS — BP 124/76 | HR 88 | Ht 69.0 in | Wt 297.8 lb

## 2023-05-06 DIAGNOSIS — I10 Essential (primary) hypertension: Secondary | ICD-10-CM

## 2023-05-06 DIAGNOSIS — I2510 Atherosclerotic heart disease of native coronary artery without angina pectoris: Secondary | ICD-10-CM | POA: Diagnosis not present

## 2023-05-06 DIAGNOSIS — E7800 Pure hypercholesterolemia, unspecified: Secondary | ICD-10-CM

## 2023-05-06 MED ORDER — EZETIMIBE 10 MG PO TABS: 10.0000 mg | ORAL_TABLET | Freq: Every day | ORAL | 3 refills | Status: DC

## 2023-05-06 NOTE — Patient Instructions (Signed)
Medication Instructions:   START EZETIMIBE 10 MG ONCE DAILY  *If you need a refill on your cardiac medications before your next appointment, please call your pharmacy*   Lab Work:  Your physician recommends that you return for lab work in: 8 WEEKS-FASTING  If you have labs (blood work) drawn today and your tests are completely normal, you will receive your results only by: MyChart Message (if you have MyChart) OR A paper copy in the mail If you have any lab test that is abnormal or we need to change your treatment, we will call you to review the results.  Follow-Up: At [facility], you and your health needs are our priority.  As part of our continuing mission to provide you with exceptional heart care, we have created designated Provider Care Teams.  These Care Teams include your primary Cardiologist (physician) and Advanced Practice Providers (APPs -  Physician Assistants and Nurse Practitioners) who all work together to provide you with the care you need, when you need it.  We recommend signing up for the patient portal called "MyChart".  Sign up information is provided on this After Visit Summary.  MyChart is used to connect with patients for Virtual Visits (Telemedicine).  Patients are able to view lab/test results, encounter notes, upcoming appointments, etc.  Non-urgent messages can be sent to your provider as well.   To learn more about what you can do with MyChart, go to [email or web address].    Your next appointment:   12 month(s)  Provider:   BRIAN CRENSHAW MD    

## 2023-05-10 ENCOUNTER — Other Ambulatory Visit: Payer: Self-pay | Admitting: Family Medicine

## 2023-05-10 ENCOUNTER — Encounter: Payer: Self-pay | Admitting: Family Medicine

## 2023-05-11 ENCOUNTER — Encounter: Payer: Self-pay | Admitting: Family Medicine

## 2023-05-12 ENCOUNTER — Ambulatory Visit: Payer: Medicare Other

## 2023-05-13 ENCOUNTER — Ambulatory Visit (INDEPENDENT_AMBULATORY_CARE_PROVIDER_SITE_OTHER): Payer: Medicare Other | Admitting: Sports Medicine

## 2023-05-13 ENCOUNTER — Other Ambulatory Visit (INDEPENDENT_AMBULATORY_CARE_PROVIDER_SITE_OTHER): Payer: Medicare Other

## 2023-05-13 DIAGNOSIS — M170 Bilateral primary osteoarthritis of knee: Secondary | ICD-10-CM | POA: Diagnosis not present

## 2023-05-13 NOTE — Assessment & Plan Note (Signed)
We finished a series of Orthovisc last October, recurrence of pain, we will do bilateral steroid injections today, if she does not get 3 to 6 months of relief we can do Orthovisc again.

## 2023-05-13 NOTE — Progress Notes (Signed)
    Procedures performed today:    Procedure: Real-time Ultrasound Guided injection of the left knee Device: Samsung HS60  Verbal informed consent obtained.  Time-out conducted.  Noted no overlying erythema, induration, or other signs of local infection.  Skin prepped in a sterile fashion.  Local anesthesia: Topical Ethyl chloride.  With sterile technique and under real time ultrasound guidance: Effusion noted, 1 cc Kenalog 40, 2 cc lidocaine, 2 cc bupivacaine injected easily Completed without difficulty  Advised to call if fevers/chills, erythema, induration, drainage, or persistent bleeding.  Images permanently stored and available for review in PACS.  Impression: Technically successful ultrasound guided injection.  Procedure: Real-time Ultrasound Guided injection of the right knee Device: Samsung HS60  Verbal informed consent obtained.  Time-out conducted.  Noted no overlying erythema, induration, or other signs of local infection.  Skin prepped in a sterile fashion.  Local anesthesia: Topical Ethyl chloride.  With sterile technique and under real time ultrasound guidance: Effusion noted, 1 cc Kenalog 40, 2 cc lidocaine, 2 cc bupivacaine injected easily Completed without difficulty  Advised to call if fevers/chills, erythema, induration, drainage, or persistent bleeding.  Images permanently stored and available for review in PACS.  Impression: Technically successful ultrasound guided injection.  Independent interpretation of notes and tests performed by another provider:   None.  Brief History, Exam, Impression, and Recommendations:    Primary osteoarthritis of both knees We finished a series of Orthovisc last October, recurrence of pain, we will do bilateral steroid injections today, if she does not get 3 to 6 months of relief we can do Orthovisc again.    ____________________________________________ Ihor Austin. Benjamin Stain, M.D., ABFM., CAQSM., AME. Primary Care and  Sports Medicine [facility]  Adjunct Professor of Family Medicine  [employer]  Restaurant manager, fast food

## 2023-05-14 ENCOUNTER — Ambulatory Visit: Payer: Medicare Other

## 2023-05-19 ENCOUNTER — Encounter: Payer: Medicare Other | Admitting: Rehabilitative and Restorative Service Providers"

## 2023-05-19 ENCOUNTER — Ambulatory Visit (INDEPENDENT_AMBULATORY_CARE_PROVIDER_SITE_OTHER): Payer: Medicare Other

## 2023-05-19 DIAGNOSIS — Z780 Asymptomatic menopausal state: Secondary | ICD-10-CM | POA: Diagnosis not present

## 2023-05-19 DIAGNOSIS — Z0000 Encounter for general adult medical examination without abnormal findings: Secondary | ICD-10-CM

## 2023-05-21 ENCOUNTER — Other Ambulatory Visit: Payer: Self-pay | Admitting: Family Medicine

## 2023-05-28 ENCOUNTER — Encounter: Payer: Self-pay | Admitting: Family Medicine

## 2023-06-21 ENCOUNTER — Other Ambulatory Visit: Payer: Self-pay | Admitting: Family Medicine

## 2023-06-28 ENCOUNTER — Ambulatory Visit (INDEPENDENT_AMBULATORY_CARE_PROVIDER_SITE_OTHER): Payer: Medicare Other | Admitting: Family Medicine

## 2023-06-28 ENCOUNTER — Encounter: Payer: Self-pay | Admitting: Family Medicine

## 2023-06-28 VITALS — BP 114/78 | HR 95 | Ht 69.0 in | Wt 292.0 lb

## 2023-06-28 DIAGNOSIS — F3342 Major depressive disorder, recurrent, in full remission: Secondary | ICD-10-CM

## 2023-06-28 DIAGNOSIS — E1169 Type 2 diabetes mellitus with other specified complication: Secondary | ICD-10-CM

## 2023-06-28 DIAGNOSIS — F419 Anxiety disorder, unspecified: Secondary | ICD-10-CM | POA: Diagnosis not present

## 2023-06-28 DIAGNOSIS — E785 Hyperlipidemia, unspecified: Secondary | ICD-10-CM

## 2023-06-28 DIAGNOSIS — I10 Essential (primary) hypertension: Secondary | ICD-10-CM

## 2023-06-28 DIAGNOSIS — F331 Major depressive disorder, recurrent, moderate: Secondary | ICD-10-CM

## 2023-06-28 DIAGNOSIS — F5105 Insomnia due to other mental disorder: Secondary | ICD-10-CM

## 2023-06-28 DIAGNOSIS — E1165 Type 2 diabetes mellitus with hyperglycemia: Secondary | ICD-10-CM

## 2023-06-28 LAB — POCT GLYCOSYLATED HEMOGLOBIN (HGB A1C): HbA1c, POC (controlled diabetic range): 6.5 % (ref 0.0–7.0)

## 2023-06-28 MED ORDER — RYBELSUS 14 MG PO TABS: 1.0000 | ORAL_TABLET | Freq: Every day | ORAL | 0 refills | Status: DC

## 2023-06-28 MED ORDER — BUPROPION HCL ER (XL) 150 MG PO TB24: ORAL_TABLET | ORAL | 0 refills | Status: DC

## 2023-06-28 MED ORDER — TRIAMTERENE-HCTZ 37.5-25 MG PO TABS: 1.0000 | ORAL_TABLET | Freq: Every day | ORAL | 3 refills | Status: DC

## 2023-06-28 MED ORDER — ENALAPRIL MALEATE 10 MG PO TABS: 10.0000 mg | ORAL_TABLET | Freq: Every day | ORAL | 2 refills | Status: DC

## 2023-06-28 MED ORDER — TRAZODONE HCL 50 MG PO TABS: 50.0000 mg | ORAL_TABLET | Freq: Every day | ORAL | 2 refills | Status: DC

## 2023-06-28 MED ORDER — VENLAFAXINE HCL ER 75 MG PO CP24: 75.0000 mg | ORAL_CAPSULE | Freq: Every day | ORAL | 3 refills | Status: DC

## 2023-06-28 MED ORDER — ALPRAZOLAM 0.25 MG PO TABS
0.2500 mg | ORAL_TABLET | Freq: Two times a day (BID) | ORAL | 3 refills | Status: DC | PRN
Start: 2023-06-28 — End: 2023-10-06

## 2023-06-28 NOTE — Progress Notes (Signed)
Bonnie [name] - 72 y.o. female MRN [id number]  Date of birth: 1950-12-22  Subjective Chief Complaint  Patient presents with   Diabetes   Mass   Sore Throat    HPI  Bonnie [name] is a 72 y.o. female here today for follow-up visit.  She reports that she is doing pretty well.  She continues on Rybelsus 14 mg along with [place] for management of diabetes.  Her A1c has increased slightly today.  She admits to increased fast food while traveling recently.  She feels pretty confident she can improve this now that she is back home.  She does notice a sensation in her throat where she feels like she has to clear her throat frequently.  Has cough at times.  Denies any shortness of breath or wheezing.  She tells me that her dentist has told her that she has signs of reflux.  She does not really have any other symptoms related to reflux.  She is continue on Flonase for allergies and postnasal drainage.  Blood pressure remains well-controlled with enalapril and Maxzide.  Blood pressure is actually little low today.  She denies any symptoms related to hypertension dizziness or increased fatigue.  She has not had episodes of chest pain, shortness of breath, palpitations, headaches or vision changes.  Tolerating combination of atorvastatin and ezetimibe for hyperlipidemia.  She did recently return from [place].  Her son recently passed away and her ex-husband who she is still close with is ill with some decline.  She continues on Effexor and bupropion.  She feels that these work well for her.  She did use a little more alprazolam than she typically would on her trip to [place].  ROS:  A comprehensive ROS was completed and negative except as noted per HPI    Allergies  Allergen Reactions   Latex Itching and Rash   Metformin And Related Other (See Comments)    Malaise, fatigue   Morphine And Codeine Rash and Other (See Comments)    Agitation   Tape Swelling and Other (See Comments)    Burning  sensation   Tramadol Palpitations    Heart fluttering    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   Chronic low back pain    Colon polyps    Depression    Diabetes mellitus without complication (HCC)    Fatty liver    Hypertension    Melanoma (HCC)    R upper extremity   Melanoma in situ of right upper extremity (HCC)    Mild diastolic dysfunction 07/09/2019   Osteoarthritis of lumbar spine    Renal cancer (HCC)    Skin cancer    Sleep apnea 2008   cpap nightly    Past Surgical History:  Procedure Laterality Date   ABDOMINAL HYSTERECTOMY     ANKLE SURGERY Left    in her 30s   BILE DUCT STENT PLACEMENT     CARDIAC CATHETERIZATION     CHOLECYSTECTOMY     COLONOSCOPY     FRACTURE SURGERY  2005   Back, Ankle in 1980's   JOINT REPLACEMENT  2021   Left Hip   LEFT HEART CATH AND CORONARY ANGIOGRAPHY N/A  09/05/2019   Procedure: LEFT HEART CATH AND CORONARY ANGIOGRAPHY;  Surgeon: Marykay Lex, MD;  Location: [facility] INVASIVE CV LAB;  Service: Cardiovascular;  Laterality: N/A;   LUMBAR FUSION     7 level    MOHS SURGERY     x 11   PARTIAL NEPHRECTOMY     POLYPECTOMY     SPINE SURGERY  2005   7 fused vertebra   TOTAL HIP ARTHROPLASTY Left 03/01/2020   Procedure: LEFT TOTAL HIP ARTHROPLASTY ANTERIOR APPROACH;  Surgeon: Kathryne Hitch, MD;  Location: [facility];  Service: Orthopedics;  Laterality: Left;   UPPER GASTROINTESTINAL ENDOSCOPY      Social History   Socioeconomic History   Marital status: Married    Spouse name: Bonnie [name]   Number of children: 3   Years of education: 12   Highest education level: Some college, no degree  Occupational History   Occupation: Retired  Tobacco Use   Smoking status: Former     Packs/day: 1.00    Years: 40.00    Additional pack years: 0.00    Total pack years: 40.00    Types: Cigarettes    Quit date: 12/21/2010    Years since quitting: 12.5   Smokeless tobacco: Never  Vaping Use   Vaping Use: Former   Devices: 2012-2014 while quitting cigaretter  Substance and Sexual Activity   Alcohol use: Never    Alcohol/week: 1.0 - 2.0 standard drink of alcohol    Types: 1 - 2 Standard drinks or equivalent per week   Drug use: Yes    Frequency: 3.0 times per week    Types: Hydrocodone   Sexual activity: Not Currently    Birth control/protection: Surgical, None  Other Topics Concern   Not on file  Social History Narrative   Lives with her husband. She has two daughters and one son. She enjoys gardening and reading.    Social Determinants of Health   Financial Resource Strain: Low Risk  (05/09/2023)   Overall Financial Resource Strain (CARDIA)    Difficulty of Paying Living Expenses: Not hard at all  Food Insecurity: No Food Insecurity (05/09/2023)   Hunger Vital Sign    Worried About Running Out of Food in the Last Year: Never true    Ran Out of Food in the Last Year: Never true  Transportation Needs: No Transportation Needs (05/09/2023)   PRAPARE - Administrator, Civil Service (Medical): No    Lack of Transportation (Non-Medical): No  Physical Activity: Insufficiently Active (05/09/2023)   Exercise Vital Sign    Days of Exercise per Week: 1 day    Minutes of Exercise per Session: 30 min  Stress: Stress Concern Present (05/09/2023)   [facility]nie [name] - Occupational Stress Questionnaire    Feeling of Stress : To some extent  Social Connections: Unknown (05/09/2023)   Social Connection and Isolation Panel [NHANES]    Frequency of Communication with Friends and Family: Three times a week    Frequency of Social Gatherings with Friends and Family: Patient declined    Attends Religious Services: Patient declined    Doctor, general practice or Organizations: No    Attends Banker Meetings: Never    Marital Status: Married  Recent Concern: Social Connections - Socially Isolated (03/30/2023)   Social Connection and Isolation Panel [NHANES]    Frequency of Communication with Friends and Family: Twice a week    Frequency of Social Gatherings with Friends and  Family: Never    Attends Religious Services: Never    Active Member of Clubs or Organizations: No    Attends Engineer, structural: Never    Marital Status: Married    Family History  Problem Relation Age of Onset   Depression Mother    Hyperlipidemia Mother    Hypertension Mother    Colon polyps Mother    Kidney disease Mother    Obesity Mother    Heart attack Father    Hyperlipidemia Father    Hypertension Father    Colon polyps Father    Alcohol abuse Sister    Depression Sister    Alcohol abuse Son    Drug abuse Son    Alcohol abuse Son    Drug abuse Son    Alcohol abuse Sister    Depression Sister    Drug abuse Sister    Stomach cancer Brother    Cancer Brother    Alcohol abuse Sister    Drug abuse Sister    Early death Sister    Colon cancer Neg Hx    Esophageal cancer Neg Hx    Rectal cancer Neg Hx     Health Maintenance  Topic Date Due   COVID-19 Vaccine (7 - 2023-24 season) 01/08/2024 (Originally 11/07/2022)   Lung Cancer Screening  03/29/2024 (Originally 08/23/2021)   INFLUENZA VACCINE  07/22/2023   Diabetic kidney evaluation - eGFR measurement  12/25/2023   Diabetic kidney evaluation - Urine ACR  12/25/2023   HEMOGLOBIN A1C  12/29/2023   OPHTHALMOLOGY EXAM  03/24/2024   Medicare Annual Wellness (AWV)  05/18/2024   FOOT EXAM  06/27/2024   MAMMOGRAM  07/22/2024   Colonoscopy  01/22/2028   DTaP/Tdap/Td (2 - Td or Tdap) 05/22/2032   Pneumonia Vaccine 23+ Years old  Completed   DEXA SCAN  Completed   Hepatitis C Screening  Completed   Zoster Vaccines- Shingrix  Completed   HPV VACCINES  Aged Out      ----------------------------------------------------------------------------------------------------------------------------------------------------------------------------------------------------------------- Physical Exam BP 114/78 (BP Location: Left Arm, Patient Position: Sitting, Cuff Size: Large)   Pulse 95   Ht 5\' 9"  (1.753 m)   Wt 292 lb (132.5 kg)   SpO2 95%   BMI 43.12 kg/m   Physical Exam Constitutional:      Appearance: Normal appearance.  HENT:     Head: Normocephalic and atraumatic.     Mouth/Throat:     Mouth: Mucous membranes are dry.  Eyes:     General: No scleral icterus. Cardiovascular:     Rate and Rhythm: Normal rate and regular rhythm.  Pulmonary:     Effort: Pulmonary effort is normal.     Breath sounds: Normal breath sounds.  Musculoskeletal:     Cervical back: Neck supple.  Lymphadenopathy:     Cervical: No cervical adenopathy.  Neurological:     General: No focal deficit present.     Mental Status: She is alert.  Psychiatric:        Mood and Affect: Mood normal.        Behavior: Behavior normal.     ------------------------------------------------------------------------------------------------------------------------------------------------------------------------------------------------------------------- Assessment and Plan  Essential hypertension Blood pressure is a little low today.  She denies symptoms related to hypotension.  She will continue current medications for management of hypertension.  Dyslipidemia associated with type 2 diabetes mellitus (HCC) Doing well with combination of atorvastatin and Zetia.  Recommend continuation.  Type 2 diabetes mellitus with hyperglycemia, without long-term current use of insulin (HCC) Blood sugars remain  fairly well-controlled.  She will continue Rybelsus and Farxiga at current strength.  Dietary changes encouraged.  Depression, major Remains on Effexor and bupropion.  Will plan to  continue this at current strength.  Limit use of alprazolam to as little as possible.   Meds ordered this encounter  Medications   ALPRAZolam (XANAX) 0.25 MG tablet    Sig: Take 1-2 tablets (0.25-0.5 mg total) by mouth 2 (two) times daily as needed for anxiety or sleep.    Dispense:  30 tablet    Refill:  3    Not to exceed 5 additional fills before 12/28/2021   buPROPion (WELLBUTRIN XL) 150 MG 24 hr tablet    Sig: TAKE 1 TABLET EVERY MORNING    Dispense:  90 tablet    Refill:  0   enalapril (VASOTEC) 10 MG tablet    Sig: Take 1 tablet (10 mg total) by mouth daily.    Dispense:  90 tablet    Refill:  2   Semaglutide (RYBELSUS) 14 MG TABS    Sig: Take 1 tablet (14 mg total) by mouth daily.    Dispense:  90 tablet    Refill:  0   traZODone (DESYREL) 50 MG tablet    Sig: Take 1 tablet (50 mg total) by mouth at bedtime.    Dispense:  90 tablet    Refill:  2   triamterene-hydrochlorothiazide (MAXZIDE-25) 37.5-25 MG tablet    Sig: Take 1 tablet by mouth daily.    Dispense:  90 tablet    Refill:  3   venlafaxine XR (EFFEXOR-XR) 75 MG 24 hr capsule    Sig: Take 1 capsule (75 mg total) by mouth daily.    Dispense:  90 capsule    Refill:  3    Return in about 6 months (around 12/29/2023) for F/u T2DM.    This visit occurred during the SARS-CoV-2 public health emergency.  Safety protocols were in place, including screening questions prior to the visit, additional usage of staff PPE, and extensive cleaning of exam room while observing appropriate contact time as indicated for disinfecting solutions.

## 2023-06-28 NOTE — Patient Instructions (Signed)
Try pepcid for 2-3 weeks.  Let me know if this doesn't help with the cough/throat clearing.

## 2023-06-28 NOTE — Assessment & Plan Note (Addendum)
Blood sugars remain fairly well-controlled.  She will continue Rybelsus and Farxiga at current strength.  Dietary changes encouraged.

## 2023-06-28 NOTE — Assessment & Plan Note (Signed)
Blood pressure is a little low today.  She denies symptoms related to hypotension.  She will continue current medications for management of hypertension.

## 2023-06-28 NOTE — Assessment & Plan Note (Signed)
Doing well with combination of atorvastatin and Zetia.  Recommend continuation.

## 2023-06-28 NOTE — Assessment & Plan Note (Signed)
Remains on Effexor and bupropion.  Will plan to continue this at current strength.  Limit use of alprazolam to as little as possible.

## 2023-06-29 ENCOUNTER — Other Ambulatory Visit: Payer: Self-pay | Admitting: Family Medicine

## 2023-06-29 DIAGNOSIS — Z1231 Encounter for screening mammogram for malignant neoplasm of breast: Secondary | ICD-10-CM

## 2023-06-29 LAB — HEPATIC FUNCTION PANEL
ALT: 17 IU/L (ref 0–32)
AST: 19 IU/L (ref 0–40)
Albumin: 4.1 g/dL (ref 3.8–4.8)
Alkaline Phosphatase: 111 IU/L (ref 44–121)
Bilirubin Total: 0.6 mg/dL (ref 0.0–1.2)
Bilirubin, Direct: 0.16 mg/dL (ref 0.00–0.40)
Total Protein: 6.5 g/dL (ref 6.0–8.5)

## 2023-06-29 LAB — LIPID PANEL
Chol/HDL Ratio: 2.7 ratio (ref 0.0–4.4)
Cholesterol, Total: 145 mg/dL (ref 100–199)
HDL: 53 mg/dL (ref >39)
LDL Chol Calc (NIH): 68 mg/dL (ref 0–99)
Triglycerides: 140 mg/dL (ref 0–149)
VLDL Cholesterol Cal: 24 mg/dL (ref 5–40)

## 2023-07-06 ENCOUNTER — Encounter: Payer: Self-pay | Admitting: Family Medicine

## 2023-07-07 ENCOUNTER — Ambulatory Visit: Payer: Medicare Other | Admitting: Family Medicine

## 2023-07-07 ENCOUNTER — Encounter: Payer: Self-pay | Admitting: Family Medicine

## 2023-07-07 VITALS — BP 114/72 | HR 101 | Resp 20 | Ht 69.0 in | Wt 292.0 lb

## 2023-07-07 DIAGNOSIS — N3001 Acute cystitis with hematuria: Secondary | ICD-10-CM | POA: Diagnosis not present

## 2023-07-07 DIAGNOSIS — R300 Dysuria: Secondary | ICD-10-CM

## 2023-07-07 DIAGNOSIS — R310 Gross hematuria: Secondary | ICD-10-CM

## 2023-07-07 LAB — POCT URINALYSIS DIP (CLINITEK)
Bilirubin, UA: NEGATIVE
Glucose, UA: 500 mg/dL — AB
Ketones, POC UA: NEGATIVE mg/dL
Nitrite, UA: POSITIVE — AB
POC PROTEIN,UA: >=300 — AB
Spec Grav, UA: 1.02 (ref 1.010–1.025)
Urobilinogen, UA: 0.2 E.U./dL
pH, UA: 6 (ref 5.0–8.0)

## 2023-07-07 MED ORDER — CEPHALEXIN 500 MG PO CAPS: 500.0000 mg | ORAL_CAPSULE | Freq: Two times a day (BID) | ORAL | 0 refills | Status: AC

## 2023-07-07 NOTE — Assessment & Plan Note (Signed)
-   pt presents today for hematuria and dysuria - POC UA shows large leuks and positive for nitrites. Blood also seen in urine. Will go ahead and culture - given keflex - have gone ahead and given return precautions.  - told pt to discuss farxiga with pcp

## 2023-07-07 NOTE — Progress Notes (Signed)
Acute Office Visit  Subjective:     Patient ID: Bonnie [name], female    DOB: [birth year], 72 y.o.   MRN: [id number]  Chief Complaint  Patient presents with   Urinary Tract Infection    Py comes in with complaints of burning, blood, and low back pain x1day    HPI Patient is in today for hematuria, low back pain, and burning with urination.   Review of Systems  Constitutional:  Negative for chills and fever.  Respiratory:  Negative for cough and shortness of breath.   Cardiovascular:  Negative for chest pain.  Genitourinary:  Positive for dysuria, frequency and hematuria.  Neurological:  Negative for headaches.        Objective:    BP 114/72 (BP Location: Left Wrist, Patient Position: Sitting, Cuff Size: Normal)   Pulse (!) 101   Resp 20   Ht 5\' 9"  (1.753 m)   Wt 292 lb (132.5 kg)   SpO2 96%   BMI 43.12 kg/m    Physical Exam Vitals and nursing note reviewed.  Constitutional:      General: She is not in acute distress.    Appearance: Normal appearance.  HENT:     Head: Normocephalic and atraumatic.     Right Ear: External ear normal.     Left Ear: External ear normal.     Nose: Nose normal.  Eyes:     Conjunctiva/sclera: Conjunctivae normal.  Cardiovascular:     Rate and Rhythm: Normal rate.  Pulmonary:     Effort: Pulmonary effort is normal.  Abdominal:     Tenderness: There is no right CVA tenderness or left CVA tenderness.  Neurological:     General: No focal deficit present.     Mental Status: She is alert and oriented to person, place, and time.  Psychiatric:        Mood and Affect: Mood normal.        Behavior: Behavior normal.        Thought Content: Thought content normal.        Judgment: Judgment normal.     Results for orders placed or performed in visit on 07/07/23  POCT URINALYSIS DIP (CLINITEK)  Result Value Ref Range   Color, UA other (A) yellow   Clarity, UA turbid (A) clear   Glucose, UA =500 (A) negative mg/dL   Bilirubin, UA  negative negative   Ketones, POC UA negative negative mg/dL   Spec Grav, UA 4.098 1.191 - 1.025   Blood, UA large (A) negative   pH, UA 6.0 5.0 - 8.0   POC PROTEIN,UA >=300 (A) negative, trace   Urobilinogen, UA 0.2 0.2 or 1.0 E.U./dL   Nitrite, UA Positive (A) Negative   Leukocytes, UA Large (3+) (A) Negative        Assessment & Plan:   Problem List Items Addressed This Visit       Genitourinary   Acute cystitis with hematuria - Primary    - pt presents today for hematuria and dysuria - POC UA shows large leuks and positive for nitrites. Blood also seen in urine. Will go ahead and culture - given keflex - have gone ahead and given return precautions.  - told pt to discuss farxiga with pcp       Other Visit Diagnoses     Dysuria       Gross hematuria       Relevant Orders   POCT URINALYSIS DIP (CLINITEK) (Completed)   Urine  Culture       Meds ordered this encounter  Medications   cephALEXin (KEFLEX) 500 MG capsule    Sig: Take 1 capsule (500 mg total) by mouth 2 (two) times daily for 5 days.    Dispense:  10 capsule    Refill:  0    Return if symptoms worsen or fail to improve.  Charlton Amor, DO

## 2023-07-09 LAB — URINE CULTURE
MICRO NUMBER:: 15212083
SPECIMEN QUALITY:: ADEQUATE

## 2023-07-15 ENCOUNTER — Telehealth: Payer: Self-pay | Admitting: Family Medicine

## 2023-07-15 ENCOUNTER — Encounter: Payer: Self-pay | Admitting: Family Medicine

## 2023-07-15 ENCOUNTER — Other Ambulatory Visit: Payer: Self-pay | Admitting: Family Medicine

## 2023-07-15 MED ORDER — AMOXICILLIN-POT CLAVULANATE 500-125 MG PO TABS: 500.0000 mg | ORAL_TABLET | Freq: Two times a day (BID) | ORAL | 0 refills | Status: AC

## 2023-07-15 NOTE — Telephone Encounter (Signed)
I spoke with Bonnie [name] and she states the antibiotic was renewed/refilled.

## 2023-07-15 NOTE — Telephone Encounter (Signed)
Spoke with pt and she has already picked up the prescription. Roselyn Reef, CMA

## 2023-07-15 NOTE — Telephone Encounter (Signed)
Patient called back and stated she thinks her UTI has returned she is requesting a prescription to resolve Pharmacy [facility] [name] [phone]

## 2023-07-26 ENCOUNTER — Ambulatory Visit
Admission: RE | Admit: 2023-07-26 | Discharge: 2023-07-26 | Disposition: A | Discharge status: Home / Self Care | Payer: Medicare Other | Source: Ambulatory Visit | Attending: Family Medicine | Admitting: Family Medicine

## 2023-07-26 DIAGNOSIS — Z1231 Encounter for screening mammogram for malignant neoplasm of breast: Secondary | ICD-10-CM

## 2023-08-03 ENCOUNTER — Encounter: Payer: Self-pay | Admitting: Family Medicine

## 2023-08-11 ENCOUNTER — Ambulatory Visit (INDEPENDENT_AMBULATORY_CARE_PROVIDER_SITE_OTHER): Payer: Medicare Other | Admitting: Family Medicine

## 2023-08-11 ENCOUNTER — Encounter: Payer: Self-pay | Admitting: Family Medicine

## 2023-08-11 VITALS — BP 108/69 | HR 90 | Ht 69.0 in | Wt 292.0 lb

## 2023-08-11 DIAGNOSIS — R350 Frequency of micturition: Secondary | ICD-10-CM

## 2023-08-11 DIAGNOSIS — N3000 Acute cystitis without hematuria: Secondary | ICD-10-CM | POA: Diagnosis not present

## 2023-08-11 LAB — POCT URINALYSIS DIP (CLINITEK)
Bilirubin, UA: NEGATIVE
Glucose, UA: >=1000 mg/dL — AB
Ketones, POC UA: NEGATIVE mg/dL
Nitrite, UA: NEGATIVE
POC PROTEIN,UA: 30 — AB
Spec Grav, UA: 1.015 (ref 1.010–1.025)
Urobilinogen, UA: 0.2 E.U./dL
pH, UA: 6 (ref 5.0–8.0)

## 2023-08-11 MED ORDER — CEPHALEXIN 500 MG PO CAPS: 500.0000 mg | ORAL_CAPSULE | Freq: Three times a day (TID) | ORAL | 0 refills | Status: AC

## 2023-08-11 NOTE — Progress Notes (Signed)
Bonnie [name] - 72 y.o. female MRN [id number]  Date of birth: December 04, 1951  Subjective No chief complaint on file.   HPI Bonnie [name] is a 72 y.o. female here today with complaint of possible UTI.  She has had increased frequency of urination as well as dysuria, urgency and low back pain. .  Symptoms started a few days ago.  She hasn't tried anything so far.  Feels similar to UTI she had last month.   ROS:  A comprehensive ROS was completed and negative except as noted per HPI  Allergies  Allergen Reactions   Latex Itching and Rash   Metformin And Related Other (See Comments)    Malaise, fatigue   Morphine And Codeine Rash and Other (See Comments)    Agitation   Tape Swelling and Other (See Comments)    Burning sensation   Tramadol Palpitations    Heart fluttering    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   Chronic low back pain    Colon polyps    Depression    Diabetes mellitus without complication (HCC)    Fatty liver    Hypertension    Melanoma (HCC)    R upper extremity   Melanoma in situ of right upper extremity (HCC)    Mild diastolic dysfunction 07/09/2019   Osteoarthritis of lumbar spine    Renal cancer (HCC)    Skin cancer    Sleep apnea 2008   cpap nightly    Past Surgical History:  Procedure Laterality Date   ABDOMINAL HYSTERECTOMY     ANKLE SURGERY Left    in her 30s   BILE DUCT STENT PLACEMENT     CARDIAC CATHETERIZATION     CHOLECYSTECTOMY     COLONOSCOPY     FRACTURE SURGERY  2005   Back, Ankle in 1980's   JOINT REPLACEMENT  2021   Left Hip   LEFT HEART CATH AND CORONARY ANGIOGRAPHY N/A 09/05/2019   Procedure: LEFT HEART CATH AND CORONARY ANGIOGRAPHY;  Surgeon: Marykay Lex, MD;   Location: [facility] INVASIVE CV LAB;  Service: Cardiovascular;  Laterality: N/A;   LUMBAR FUSION     7 level    MOHS SURGERY     x 11   PARTIAL NEPHRECTOMY     POLYPECTOMY     SPINE SURGERY  2005   7 fused vertebra   TOTAL HIP ARTHROPLASTY Left 03/01/2020   Procedure: LEFT TOTAL HIP ARTHROPLASTY ANTERIOR APPROACH;  Surgeon: Kathryne Hitch, MD;  Location: [facility];  Service: Orthopedics;  Laterality: Left;   UPPER GASTROINTESTINAL ENDOSCOPY      Social History   Socioeconomic History   Marital status: Married    Spouse name: Darrell   Number of children: 3   Years of education: 12   Highest education level: Some college, no degree  Occupational History   Occupation: Retired  Tobacco Use   Smoking status: Former    Current packs/day: 0.00    Average packs/day: 1 pack/day for 40.0 years (40.0 ttl pk-yrs)    Types: Cigarettes    Start date: 12/21/1970    Quit date: 12/21/2010    Years since quitting: 12.6   Smokeless tobacco:  Never  Vaping Use   Vaping status: Former   Devices: 2012-2014 while quitting cigaretter  Substance and Sexual Activity   Alcohol use: Never    Alcohol/week: 1.0 - 2.0 standard drink of alcohol    Types: 1 - 2 Standard drinks or equivalent per week   Drug use: Yes    Frequency: 3.0 times per week    Types: Hydrocodone   Sexual activity: Not Currently    Birth control/protection: Surgical, None  Other Topics Concern   Not on file  Social History Narrative   Lives with her husband. She has two daughters and one son. She enjoys gardening and reading.    Social Determinants of Health   Financial Resource Strain: Low Risk  (05/09/2023)   Overall Financial Resource Strain (CARDIA)    Difficulty of Paying Living Expenses: Not hard at all  Food Insecurity: No Food Insecurity (05/09/2023)   Hunger Vital Sign    Worried About Running Out of Food in the Last Year: Never true    Ran Out of Food in the Last Year: Never true  Transportation Needs: No  Transportation Needs (05/09/2023)   PRAPARE - Administrator, Civil Service (Medical): No    Lack of Transportation (Non-Medical): No  Physical Activity: Insufficiently Active (05/09/2023)   Exercise Vital Sign    Days of Exercise per Week: 1 day    Minutes of Exercise per Session: 30 min  Stress: Stress Concern Present (05/09/2023)   [employer] of Occupational Health - Occupational Stress Questionnaire    Feeling of Stress : To some extent  Social Connections: Unknown (05/09/2023)   Social Connection and Isolation Panel [NHANES]    Frequency of Communication with Friends and Family: Three times a week    Frequency of Social Gatherings with Friends and Family: Patient declined    Attends Religious Services: Patient declined    Database administrator or Organizations: No    Attends Engineer, structural: Never    Marital Status: Married  Recent Concern: Social Connections - Socially Isolated (03/30/2023)   Social Connection and Isolation Panel [NHANES]    Frequency of Communication with Friends and Family: Twice a week    Frequency of Social Gatherings with Friends and Family: Never    Attends Religious Services: Never    Database administrator or Organizations: No    Attends Engineer, structural: Never    Marital Status: Married    Family History  Problem Relation Age of Onset   Depression Mother    Hyperlipidemia Mother    Hypertension Mother    Colon polyps Mother    Kidney disease Mother    Obesity Mother    Heart attack Father    Hyperlipidemia Father    Hypertension Father    Colon polyps Father    Alcohol abuse Sister    Depression Sister    Alcohol abuse Son    Drug abuse Son    Alcohol abuse Son    Drug abuse Son    Alcohol abuse Sister    Depression Sister    Drug abuse Sister    Stomach cancer Brother    Cancer Brother    Alcohol abuse Sister    Drug abuse Sister    Early death Sister    Colon cancer Neg Hx    Esophageal  cancer Neg Hx    Rectal cancer Neg Hx     Health Maintenance  Topic Date Due  INFLUENZA VACCINE  07/22/2023   COVID-19 Vaccine (7 - 2023-24 season) 01/08/2024 (Originally 11/07/2022)   Lung Cancer Screening  03/29/2024 (Originally 08/23/2021)   Diabetic kidney evaluation - eGFR measurement  12/25/2023   Diabetic kidney evaluation - Urine ACR  12/25/2023   HEMOGLOBIN A1C  12/29/2023   OPHTHALMOLOGY EXAM  03/24/2024   Medicare Annual Wellness (AWV)  05/18/2024   FOOT EXAM  06/27/2024   MAMMOGRAM  07/25/2025   Colonoscopy  01/22/2028   DTaP/Tdap/Td (2 - Td or Tdap) 05/22/2032   Pneumonia Vaccine 69+ Years old  Completed   DEXA SCAN  Completed   Hepatitis C Screening  Completed   Zoster Vaccines- Shingrix  Completed   HPV VACCINES  Aged Out     ----------------------------------------------------------------------------------------------------------------------------------------------------------------------------------------------------------------- Physical Exam BP 108/69   Pulse 90   Ht 5\' 9"  (1.753 m)   Wt 292 lb (132.5 kg)   SpO2 97%   BMI 43.12 kg/m   Physical Exam Constitutional:      Appearance: Normal appearance.  Neurological:     Mental Status: She is alert.  Psychiatric:        Mood and Affect: Mood normal.        Behavior: Behavior normal.     ------------------------------------------------------------------------------------------------------------------------------------------------------------------------------------------------------------------- Assessment and Plan  Acute cystitis Start cephalexin 500mg  tid x7 days.  Urine sent for culture and will modify treatment as needed based on results.    Meds ordered this encounter  Medications   cephALEXin (KEFLEX) 500 MG capsule    Sig: Take 1 capsule (500 mg total) by mouth 3 (three) times daily for 7 days.    Dispense:  21 capsule    Refill:  0    No follow-ups on file.    This visit occurred  during the SARS-CoV-2 public health emergency.  Safety protocols were in place, including screening questions prior to the visit, additional usage of staff PPE, and extensive cleaning of exam room while observing appropriate contact time as indicated for disinfecting solutions.

## 2023-08-11 NOTE — Assessment & Plan Note (Signed)
Start cephalexin 500mg  tid x7 days.  Urine sent for culture and will modify treatment as needed based on results.

## 2023-08-11 NOTE — Patient Instructions (Signed)
Urinary Tract Infection, Adult A urinary tract infection (UTI) is an infection of any part of the urinary tract. The urinary tract includes: The kidneys. The ureters. The bladder. The urethra. These organs make, store, and get rid of pee (urine) in the body. What are the causes? This infection is caused by germs (bacteria) in your genital area. These germs grow and cause swelling (inflammation) of your urinary tract. What increases the risk? The following factors may make you more likely to develop this condition: Using a small, thin tube (catheter) to drain pee. Not being able to control when you pee or poop (incontinence). Being female. If you are female, these things can increase the risk: Using these methods to prevent pregnancy: A medicine that kills sperm (spermicide). A device that blocks sperm (diaphragm). Having low levels of a female hormone (estrogen). Being pregnant. You are more likely to develop this condition if: You have genes that add to your risk. You are sexually active. You take antibiotic medicines. You have trouble peeing because of: A prostate that is bigger than normal, if you are female. A blockage in the part of your body that drains pee from the bladder. A kidney stone. A nerve condition that affects your bladder. Not getting enough to drink. Not peeing often enough. You have other conditions, such as: Diabetes. A weak disease-fighting system (immune system). Sickle cell disease. Gout. Injury of the spine. What are the signs or symptoms? Symptoms of this condition include: Needing to pee right away. Peeing small amounts often. Pain or burning when peeing. Blood in the pee. Pee that smells bad or not like normal. Trouble peeing. Pee that is cloudy. Fluid coming from the vagina, if you are female. Pain in the belly or lower back. Other symptoms include: Vomiting. Not feeling hungry. Feeling mixed up (confused). This may be the first symptom in  older adults. Being tired and grouchy (irritable). A fever. Watery poop (diarrhea). How is this treated? Taking antibiotic medicine. Taking other medicines. Drinking enough water. In some cases, you may need to see a specialist. Follow these instructions at home:  Medicines Take over-the-counter and prescription medicines only as told by your doctor. If you were prescribed an antibiotic medicine, take it as told by your doctor. Do not stop taking it even if you start to feel better. General instructions Make sure you: Pee until your bladder is empty. Do not hold pee for a long time. Empty your bladder after sex. Wipe from front to back after peeing or pooping if you are a female. Use each tissue one time when you wipe. Drink enough fluid to keep your pee pale yellow. Keep all follow-up visits. Contact a doctor if: You do not get better after 1-2 days. Your symptoms go away and then come back. Get help right away if: You have very bad back pain. You have very bad pain in your lower belly. You have a fever. You have chills. You feeling like you will vomit or you vomit. Summary A urinary tract infection (UTI) is an infection of any part of the urinary tract. This condition is caused by germs in your genital area. There are many risk factors for a UTI. Treatment includes antibiotic medicines. Drink enough fluid to keep your pee pale yellow. This information is not intended to replace advice given to you by your health care provider. Make sure you discuss any questions you have with your health care provider. Document Revised: 07/14/2020 Document Reviewed: 07/19/2020 Elsevier Patient Education    2024 [employer] Inc[employer]  

## 2023-08-14 LAB — URINE CULTURE

## 2023-08-14 LAB — SPECIMEN STATUS REPORT

## 2023-08-22 ENCOUNTER — Telehealth: Payer: Self-pay | Admitting: Medical-Surgical

## 2023-08-22 NOTE — Telephone Encounter (Signed)
Called received from on call service. Patient reports she has had a recurrent UTI which has been treated with 2 rounds of Keflex and 1 round of Augmentin. She finished her last round of Keflex a few days ago but woke up with burning with urination again this morning. Requesting a refill on her antibiotics. States the urgent cares in her area are closed today. On review, her urine cultures grew e coli showing susceptibility to the antibiotics used. Recommend in person reevaluation at Urgent care or ED. Quick online search showed two UCs in [place] are open today. If symptoms are minor, ok to be seen on Tuesday morning in office for further evaluation.  ___________________________________________ Thayer Ohm, DNP, APRN, FNP-BC Primary Care and Sports Medicine [facility]

## 2023-08-23 ENCOUNTER — Ambulatory Visit
Admission: RE | Admit: 2023-08-23 | Discharge: 2023-08-23 | Disposition: A | Discharge status: Home / Self Care | Payer: Medicare Other | Source: Ambulatory Visit | Attending: Internal Medicine | Admitting: Internal Medicine

## 2023-08-23 VITALS — BP 129/84 | HR 91 | Temp 97.5°F | Resp 18

## 2023-08-23 DIAGNOSIS — N3001 Acute cystitis with hematuria: Secondary | ICD-10-CM

## 2023-08-23 DIAGNOSIS — R81 Glycosuria: Secondary | ICD-10-CM

## 2023-08-23 LAB — POCT URINALYSIS DIP (MANUAL ENTRY)
Bilirubin, UA: NEGATIVE
Glucose, UA: >=1000 mg/dL — AB
Nitrite, UA: NEGATIVE
Protein Ur, POC: 100 mg/dL — AB
Spec Grav, UA: 1.025 (ref 1.010–1.025)
Urobilinogen, UA: 0.2 U/dL
pH, UA: 6 (ref 5.0–8.0)

## 2023-08-23 MED ORDER — SULFAMETHOXAZOLE-TRIMETHOPRIM 800-160 MG PO TABS: 1.0000 | ORAL_TABLET | Freq: Two times a day (BID) | ORAL | 0 refills | Status: AC

## 2023-08-23 NOTE — Discharge Instructions (Addendum)
Advised patient of UA results including 1000 glucose in urine.  Instructed patient to take medication as directed with food to completion.  Encouraged to increase daily water intake to 64 ounces per day while taking this medication.  Advised we will follow-up with urine culture results once received.  Advised if symptoms worsen please follow-up with PCP or [facility] for further evaluation.

## 2023-08-23 NOTE — ED Triage Notes (Signed)
Pt c/o dysuria, foul smell with urination, hematuria, lower back pain, and fatigue x2 days. Pt reports 4 UTI's since July. Pt had just finished round of Keflex on 8/28.

## 2023-08-23 NOTE — ED Provider Notes (Signed)
[facility]    CSN: [id number] Arrival date & time: 08/23/23  0848      History   Chief Complaint Chief Complaint  Patient presents with   Recurrent UTI    HPI Bonnie [name] is a 72 y.o. female.   HPI 72 year old female presents with dysuria and foul-smelling urination with hematuria and lower back pain for 2 days.  Patient reports 4 UTIs since July.  Reports finishing Keflex on or about 8/28.  Patient contacted by her PCP that most recent antibiotic would cover results of urine culture.  PMH significant for morbid obesity, SCC, and CKD stage III.  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   Chronic low back pain    Colon polyps    Depression    Diabetes mellitus without complication (HCC)    Fatty liver    Hypertension    Melanoma (HCC)    R upper extremity   Melanoma in situ of right upper extremity (HCC)    Mild diastolic dysfunction 07/09/2019   Osteoarthritis of lumbar spine    Renal cancer (HCC)    Skin cancer    Sleep apnea 2008   cpap nightly    Patient Active Problem List   Diagnosis Date Noted   Acute cystitis 08/11/2023   Acute cystitis with hematuria 07/07/2023   Renal mass, right 12/01/2022   Impingement syndrome, shoulder, right 12/05/2021   Well adult exam 10/28/2021   Status post total replacement of left hip 03/01/2020   Acute kidney injury superimposed on CKD (HCC) 09/17/2019   Insomnia secondary to anxiety 09/17/2019   Increase in serum creatinine from prior measurement 09/17/2019   Abnormal nuclear stress test 09/04/2019   Mild diastolic dysfunction 07/09/2019   Right bundle branch block 07/05/2019   Sphincter of Oddi dysfunction 07/05/2019   Multiple pulmonary nodules determined  by computed tomography of lung 07/05/2019   Irregular heart beats 06/29/2019   Family history of CHF (congestive heart failure) 06/29/2019   Recurrent major depressive disorder, in partial remission (HCC) 06/12/2019   History of colon polyps 01/17/2019   Centrilobular emphysema (HCC) 07/25/2018   Closed compression fracture of body of L1 vertebra (HCC) 07/25/2018   Renal cyst, acquired, left 07/25/2018   Primary osteoarthritis involving multiple joints 07/06/2018   History of multiple pulmonary nodules 07/06/2018   Encounter for screening for lung cancer 07/06/2018   Type 2 diabetes mellitus with hyperglycemia, without long-term current use of insulin (HCC) 04/04/2018   Dyslipidemia associated with type 2 diabetes mellitus (HCC) 04/04/2018   Class 3 severe obesity due to excess calories with serious comorbidity and body mass index (BMI) of 40.0 to 44.9 in adult (HCC) 04/03/2018   Onychomycosis 03/28/2018   Morbid obesity (HCC) 02/28/2018   Benign cyst of right breast 01/26/2018   Abnormal mammogram of right breast 01/20/2018   Chronic kidney disease, stage 3a (HCC) 12/31/2017   Encounter for chronic pain management 12/30/2017   Chronic use of opiate for therapeutic purpose 12/30/2017   History of melanoma 12/30/2017   History of renal cell carcinoma 12/30/2017   Arthritis of carpometacarpal (CMC) joint of left thumb 05/19/2017   Controlled substance agreement signed 03/30/2017   H/O  acute pancreatitis 07/17/2016   Coronary artery calcification seen on CT scan 11/08/2015   History of back surgery 09/23/2015   Spondylosis of lumbar region without myelopathy or radiculopathy 08/30/2015   Primary osteoarthritis of both knees 03/01/2015   Anxiety 07/24/2014   Basal cell carcinoma 10/04/2013   Depression, major 10/04/2013   Squamous cell carcinoma 10/04/2013   Dyspnea on exertion -> considered to be possible anginal equivalent 04/20/2012   Essential hypertension 03/29/2012   Former  smoker 03/29/2012   Hyperlipidemia 03/29/2012   Palpitations 03/29/2012   OSA on CPAP 03/29/2012    Past Surgical History:  Procedure Laterality Date   ABDOMINAL HYSTERECTOMY     ANKLE SURGERY Left    in her 30s   BILE DUCT STENT PLACEMENT     CARDIAC CATHETERIZATION     CHOLECYSTECTOMY     COLONOSCOPY     FRACTURE SURGERY  2005   Back, Ankle in 1980's   JOINT REPLACEMENT  2021   Left Hip   LEFT HEART CATH AND CORONARY ANGIOGRAPHY N/A 09/05/2019   Procedure: LEFT HEART CATH AND CORONARY ANGIOGRAPHY;  Surgeon: Marykay Lex, MD;  Location: [facility] INVASIVE CV LAB;  Service: Cardiovascular;  Laterality: N/A;   LUMBAR FUSION     7 level    MOHS SURGERY     x 11   PARTIAL NEPHRECTOMY     POLYPECTOMY     SPINE SURGERY  2005   7 fused vertebra   TOTAL HIP ARTHROPLASTY Left 03/01/2020   Procedure: LEFT TOTAL HIP ARTHROPLASTY ANTERIOR APPROACH;  Surgeon: Kathryne Hitch, MD;  Location: [facility];  Service: Orthopedics;  Laterality: Left;   UPPER GASTROINTESTINAL ENDOSCOPY      OB History     Gravida  2   Para  2   Term      Preterm      AB      Living  2      SAB      IAB      Ectopic      Multiple      Live Births  2            Home Medications    Prior to Admission medications   Medication Sig Start Date End Date Taking? Authorizing Provider  sulfamethoxazole-trimethoprim (BACTRIM DS) 800-160 MG tablet Take 1 tablet by mouth 2 (two) times daily for 7 days. 08/23/23 08/30/23 Yes Trevor Iha, FNP  ALPRAZolam Prudy Feeler) 0.25 MG tablet Take 1-2 tablets (0.25-0.5 mg total) by mouth 2 (two) times daily as needed for anxiety or sleep. 06/28/23   Everrett Coombe, DO  aspirin 81 MG chewable tablet Chew by mouth daily.    [provider]  atorvastatin (LIPITOR) 80 MG tablet Take 1 tablet (80 mg total) by mouth daily. 04/09/23   Everrett Coombe, DO  blood glucose meter kit and supplies KIT Check morning fasting blood glucose and up to four times daily as  directed. 04/04/18   Carlis Stable, PA-C  buPROPion (WELLBUTRIN XL) 150 MG 24 hr tablet TAKE 1 TABLET EVERY MORNING 06/28/23   Everrett Coombe, DO  Cholecalciferol (VITAMIN D3) 50 MCG (2000 UT) TABS Take 2,000 Units by mouth daily. 10/10/19   Sunnie Nielsen, DO  dapagliflozin propanediol (FARXIGA) 10 MG TABS tablet Take 1 tablet (10 mg total) by mouth daily before breakfast. 12/25/22   Everrett Coombe, DO  enalapril (VASOTEC) 10 MG tablet Take 1 tablet (10 mg total) by mouth daily. 06/28/23   Ashley Royalty,  [place], DO  ezetimibe (ZETIA) 10 MG tablet Take 1 tablet (10 mg total) by mouth daily. 05/06/23   Lewayne Bunting, MD  fluticasone (FLONASE) 50 MCG/ACT nasal spray Place 2 sprays into both nostrils daily as needed for allergies or rhinitis.  10/09/19   [provider]  HYDROcodone-acetaminophen (NORCO/VICODIN) 5-325 MG tablet Take 1 tablet by mouth every 8 (eight) hours as needed for moderate pain or severe pain. 03/25/23   Everrett Coombe, DO  Lancets [facility] DELICA PLUS [place]) MISC USE AS DIRECTED UP TO 4    TIMES DAILY 04/13/22   Everrett Coombe, DO  Multiple Vitamin tablet Take 1 tablet by mouth daily.     [provider]  Omega-3 Fatty Acids (FISH OIL) 1000 MG CAPS Take 1,000 mg by mouth daily.     [provider]  ONETOUCH VERIO test strip USE UP TO 4 TIMES A DAY AS DIRECTED WITH GLUCOMETER 04/13/22   Everrett Coombe, DO  Semaglutide (RYBELSUS) 14 MG TABS Take 1 tablet (14 mg total) by mouth daily. 06/28/23   Everrett Coombe, DO  traZODone (DESYREL) 50 MG tablet Take 1 tablet (50 mg total) by mouth at bedtime. 06/28/23   Everrett Coombe, DO  triamterene-hydrochlorothiazide (MAXZIDE-25) 37.5-25 MG tablet Take 1 tablet by mouth daily. 06/28/23   Everrett Coombe, DO  venlafaxine XR (EFFEXOR-XR) 75 MG 24 hr capsule Take 1 capsule (75 mg total) by mouth daily. 06/28/23   Everrett Coombe, DO    Family History Family History  Problem Relation Age of Onset   Depression Mother     Hyperlipidemia Mother    Hypertension Mother    Colon polyps Mother    Kidney disease Mother    Obesity Mother    Heart attack Father    Hyperlipidemia Father    Hypertension Father    Colon polyps Father    Alcohol abuse Sister    Depression Sister    Alcohol abuse Son    Drug abuse Son    Alcohol abuse Son    Drug abuse Son    Alcohol abuse Sister    Depression Sister    Drug abuse Sister    Stomach cancer Brother    Cancer Brother    Alcohol abuse Sister    Drug abuse Sister    Early death Sister    Colon cancer Neg Hx    Esophageal cancer Neg Hx    Rectal cancer Neg Hx     Social History Social History   Tobacco Use   Smoking status: Former    Current packs/day: 0.00    Average packs/day: 1 pack/day for 40.0 years (40.0 ttl pk-yrs)    Types: Cigarettes    Start date: 12/21/1970    Quit date: 12/21/2010    Years since quitting: 12.6   Smokeless tobacco: Never  Vaping Use   Vaping status: Former   Devices: 2012-2014 while quitting cigaretter  Substance Use Topics   Alcohol use: Never    Alcohol/week: 1.0 - 2.0 standard drink of alcohol    Types: 1 - 2 Standard drinks or equivalent per week   Drug use: Yes    Frequency: 3.0 times per week    Types: Hydrocodone     Allergies   Latex, Metformin and related, Morphine and codeine, Tape, and Tramadol   Review of Systems Review of Systems  Genitourinary:  Positive for dysuria, frequency and urgency.     Physical Exam Triage Vital Signs ED Triage Vitals  Encounter Vitals Group  BP 08/23/23 0902 129/84     Systolic BP Percentile --      Diastolic BP Percentile --      Pulse Rate 08/23/23 0902 91     Resp 08/23/23 0902 18     Temp 08/23/23 0902 (!) 97.5 F (36.4 C)     Temp Source 08/23/23 0902 Oral     SpO2 08/23/23 0902 96 %     Weight --      Height --      Head Circumference --      Peak Flow --      Pain Score 08/23/23 0858 0     Pain Loc --      Pain Education --      Exclude from  Growth Chart --    No data found.  Updated Vital Signs BP 129/84 (BP Location: Right Arm)   Pulse 91   Temp (!) 97.5 F (36.4 C) (Oral)   Resp 18   SpO2 96%    Physical Exam Vitals and nursing note reviewed.  Constitutional:      Appearance: She is normal weight.  HENT:     Head: Normocephalic and atraumatic.     Mouth/Throat:     Mouth: Mucous membranes are moist.     Pharynx: Oropharynx is clear.  Eyes:     Extraocular Movements: Extraocular movements intact.     Conjunctiva/sclera: Conjunctivae normal.     Pupils: Pupils are equal, round, and reactive to light.  Cardiovascular:     Rate and Rhythm: Normal rate and regular rhythm.     Pulses: Normal pulses.     Heart sounds: Normal heart sounds.  Pulmonary:     Effort: Pulmonary effort is normal.     Breath sounds: Normal breath sounds. No wheezing, rhonchi or rales.  Musculoskeletal:        General: Normal range of motion.     Cervical back: Normal range of motion and neck supple.  Skin:    General: Skin is warm and dry.  Neurological:     General: No focal deficit present.     Mental Status: She is alert and oriented to person, place, and time. Mental status is at baseline.  Psychiatric:        Mood and Affect: Mood normal.        Behavior: Behavior normal.        Thought Content: Thought content normal.      UC Treatments / Results  Labs (all labs ordered are listed, but only abnormal results are displayed) Labs Reviewed  POCT URINALYSIS DIP (MANUAL ENTRY) - Abnormal; Notable for the following components:      Result Value   Clarity, UA cloudy (*)    Glucose, UA >=1,000 (*)    Ketones, POC UA moderate (40) (*)    Blood, UA large (*)    Protein Ur, POC =100 (*)    Leukocytes, UA Small (1+) (*)    All other components within normal limits  URINE CULTURE    EKG   Radiology No results found.  Procedures Procedures (including critical care time)  Medications Ordered in UC Medications - No  data to display  Initial Impression / Assessment and Plan / UC Course  I have reviewed the triage vital signs and the nursing notes.  Pertinent labs & imaging results that were available during my care of the patient were reviewed by me and considered in my medical decision making (see chart for details).  MDM: 1.  Acute cystitis with hematuria-UA revealed above, urine culture ordered Rx'd Bactrim DS 800/160 mg tablet twice daily x 7 days, patient advised to follow-up with PCP or [place] [facility] contact information provided with his AVS today); 2.  Glucosuria-UA revealed 1000 glucose advised patient to follow-up with PCP regarding diabetic/T2DM control. Advised patient of UA results including 1000 glucose in urine.  Instructed patient to take medication as directed with food to completion.  Encouraged to increase daily water intake to 64 ounces per day while taking this medication.  Advised we will follow-up with urine culture results once received.  Advised if symptoms worsen please follow-up with PCP or [facility] for further evaluation.  Final Clinical Impressions(s) / UC Diagnoses   Final diagnoses:  Acute cystitis with hematuria  Glucosuria     Discharge Instructions      Advised patient of UA results including 1000 glucose in urine.  Instructed patient to take medication as directed with food to completion.  Encouraged to increase daily water intake to 64 ounces per day while taking this medication.  Advised we will follow-up with urine culture results once received.  Advised if symptoms worsen please follow-up with PCP or [facility] for further evaluation.     ED Prescriptions     Medication Sig Dispense Auth. Provider   sulfamethoxazole-trimethoprim (BACTRIM DS) 800-160 MG tablet Take 1 tablet by mouth 2 (two) times daily for 7 days. 14 tablet Trevor Iha, FNP      PDMP not reviewed this encounter.   Trevor Iha, FNP 08/23/23 [phone]

## 2023-08-24 ENCOUNTER — Telehealth: Payer: Self-pay

## 2023-08-24 ENCOUNTER — Encounter: Payer: Self-pay | Admitting: Family Medicine

## 2023-08-24 NOTE — Telephone Encounter (Signed)
She should already be established with a urologist.  I don't think she should need a new referral.  CM

## 2023-08-24 NOTE — Telephone Encounter (Signed)
Bonnie [name] states she feels weak. She states her glucose in her urine was 1,000. I asked her to check her glucose. She was unable. The glucometer was giving a error message. I advised her to go to the ED just in case her glucose is more than 500 mg/dl.

## 2023-08-25 ENCOUNTER — Ambulatory Visit: Payer: Medicare Other | Admitting: Family Medicine

## 2023-08-25 NOTE — Telephone Encounter (Signed)
I called patient and advised her to follow up with her urologist.

## 2023-08-26 LAB — URINE CULTURE: Culture: >=100000 — AB

## 2023-08-27 ENCOUNTER — Ambulatory Visit (HOSPITAL_BASED_OUTPATIENT_CLINIC_OR_DEPARTMENT_OTHER)
Admission: RE | Admit: 2023-08-27 | Discharge: 2023-08-27 | Disposition: A | Discharge status: Home / Self Care | Payer: Medicare Other | Source: Ambulatory Visit | Attending: Family Medicine | Admitting: Family Medicine

## 2023-08-27 ENCOUNTER — Ambulatory Visit (INDEPENDENT_AMBULATORY_CARE_PROVIDER_SITE_OTHER): Payer: Medicare Other | Admitting: Family Medicine

## 2023-08-27 ENCOUNTER — Encounter: Payer: Self-pay | Admitting: Family Medicine

## 2023-08-27 ENCOUNTER — Inpatient Hospital Stay (HOSPITAL_BASED_OUTPATIENT_CLINIC_OR_DEPARTMENT_OTHER): Admission: RE | Admit: 2023-08-27 | Payer: Medicare Other | Source: Ambulatory Visit

## 2023-08-27 VITALS — BP 134/81 | HR 92 | Temp 97.8°F | Ht 69.0 in | Wt 292.0 lb

## 2023-08-27 DIAGNOSIS — N3000 Acute cystitis without hematuria: Secondary | ICD-10-CM

## 2023-08-27 DIAGNOSIS — N3001 Acute cystitis with hematuria: Secondary | ICD-10-CM

## 2023-08-27 DIAGNOSIS — R350 Frequency of micturition: Secondary | ICD-10-CM | POA: Diagnosis not present

## 2023-08-27 DIAGNOSIS — N10 Acute tubulo-interstitial nephritis: Secondary | ICD-10-CM | POA: Insufficient documentation

## 2023-08-27 LAB — POCT URINALYSIS DIP (CLINITEK)
Glucose, UA: 500 mg/dL — AB
Ketones, POC UA: NEGATIVE mg/dL
Nitrite, UA: NEGATIVE
POC PROTEIN,UA: 30 — AB
Spec Grav, UA: >=1.03 — AB (ref 1.010–1.025)
Urobilinogen, UA: 0.2 U/dL
pH, UA: 6 (ref 5.0–8.0)

## 2023-08-27 MED ORDER — CEFTRIAXONE SODIUM 1 G IJ SOLR
1.0000 g | Freq: Once | INTRAMUSCULAR | Status: AC
Start: 2023-08-27 — End: 2023-08-27
  Administered 2023-08-27: 1 g via INTRAMUSCULAR

## 2023-08-27 MED ORDER — IOHEXOL 300 MG/ML  SOLN
100.0000 mL | Freq: Once | INTRAMUSCULAR | Status: AC | PRN
  Administered 2023-08-27: 100 mL via INTRAVENOUS

## 2023-08-29 LAB — URINE CULTURE

## 2023-08-30 NOTE — Progress Notes (Signed)
Bonnie [name] - 72 y.o. female MRN [id number]  Date of birth: 08/29/1951  Subjective Chief Complaint  Patient presents with   Urinary Tract Infection    HPI Bonnie [name] is a 72 y.o. female here today for follow-up of recurrent UTI.  She has had a few UTIs over the past couple months.  Reports that she is feeling more fatigued and continues to have some urinary frequency and dysuria.  She does have an appointment with her urologist in a few weeks.  She does have a history of renal cell carcinoma.  She denies fever or chills.  ROS:  A comprehensive ROS was completed and negative except as noted per HPI  Allergies  Allergen Reactions   Latex Itching and Rash   Metformin And Related Other (See Comments)    Malaise, fatigue   Morphine And Codeine Rash and Other (See Comments)    Agitation   Tape Swelling and Other (See Comments)    Burning sensation   Tramadol Palpitations    Heart fluttering    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   Chronic low back pain    Colon polyps    Depression    Diabetes mellitus without complication (HCC)    Fatty liver    Hypertension    Melanoma (HCC)    R upper extremity   Melanoma in situ of right upper extremity (HCC)    Mild diastolic dysfunction 07/09/2019   Osteoarthritis of lumbar spine    Renal cancer (HCC)    Skin cancer    Sleep apnea 2008   cpap nightly    Past Surgical History:  Procedure Laterality Date   ABDOMINAL HYSTERECTOMY     ANKLE SURGERY Left    in her 30s   BILE DUCT STENT PLACEMENT     CARDIAC CATHETERIZATION     CHOLECYSTECTOMY     COLONOSCOPY     FRACTURE SURGERY  2005   Back, Ankle in 1980's   JOINT REPLACEMENT  2021   Left Hip   LEFT HEART CATH AND  CORONARY ANGIOGRAPHY N/A 09/05/2019   Procedure: LEFT HEART CATH AND CORONARY ANGIOGRAPHY;  Surgeon: Marykay Lex, MD;  Location: [facility] INVASIVE CV LAB;  Service: Cardiovascular;  Laterality: N/A;   LUMBAR FUSION     7 level    MOHS SURGERY     x 11   PARTIAL NEPHRECTOMY     POLYPECTOMY     SPINE SURGERY  2005   7 fused vertebra   TOTAL HIP ARTHROPLASTY Left 03/01/2020   Procedure: LEFT TOTAL HIP ARTHROPLASTY ANTERIOR APPROACH;  Surgeon: Kathryne Hitch, MD;  Location: [facility];  Service: Orthopedics;  Laterality: Left;   UPPER GASTROINTESTINAL ENDOSCOPY      Social History   Socioeconomic History   Marital status: Married    Spouse name: Darrell   Number of children: 3   Years of education: 12   Highest education level: Some college, no degree  Occupational History   Occupation: Retired  Tobacco Use   Smoking status: Former    Current packs/day: 0.00    Average packs/day: 1 pack/day for 40.0 years (40.0 ttl pk-yrs)    Types: Cigarettes  Start date: 12/21/1970    Quit date: 12/21/2010    Years since quitting: 12.6   Smokeless tobacco: Never  Vaping Use   Vaping status: Former   Devices: 2012-2014 while quitting cigaretter  Substance and Sexual Activity   Alcohol use: Never    Alcohol/week: 1.0 - 2.0 standard drink of alcohol    Types: 1 - 2 Standard drinks or equivalent per week   Drug use: Yes    Frequency: 3.0 times per week    Types: Hydrocodone   Sexual activity: Not Currently    Birth control/protection: Surgical, None  Other Topics Concern   Not on file  Social History Narrative   Lives with her husband. She has two daughters and one son. She enjoys gardening and reading.    Social Determinants of Health   Financial Resource Strain: Low Risk  (05/09/2023)   Overall Financial Resource Strain (CARDIA)    Difficulty of Paying Living Expenses: Not hard at all  Food Insecurity: No Food Insecurity (05/09/2023)   Hunger Vital Sign    Worried About Running  Out of Food in the Last Year: Never true    Ran Out of Food in the Last Year: Never true  Transportation Needs: No Transportation Needs (05/09/2023)   PRAPARE - Administrator, Civil Service (Medical): No    Lack of Transportation (Non-Medical): No  Physical Activity: Insufficiently Active (05/09/2023)   Exercise Vital Sign    Days of Exercise per Week: 1 day    Minutes of Exercise per Session: 30 min  Stress: Stress Concern Present (05/09/2023)   [employer] of Occupational Health - Occupational Stress Questionnaire    Feeling of Stress : To some extent  Social Connections: Unknown (05/09/2023)   Social Connection and Isolation Panel [NHANES]    Frequency of Communication with Friends and Family: Three times a week    Frequency of Social Gatherings with Friends and Family: Patient declined    Attends Religious Services: Patient declined    Database administrator or Organizations: No    Attends Engineer, structural: Never    Marital Status: Married  Recent Concern: Social Connections - Socially Isolated (03/30/2023)   Social Connection and Isolation Panel [NHANES]    Frequency of Communication with Friends and Family: Twice a week    Frequency of Social Gatherings with Friends and Family: Never    Attends Religious Services: Never    Database administrator or Organizations: No    Attends Engineer, structural: Never    Marital Status: Married    Family History  Problem Relation Age of Onset   Depression Mother    Hyperlipidemia Mother    Hypertension Mother    Colon polyps Mother    Kidney disease Mother    Obesity Mother    Heart attack Father    Hyperlipidemia Father    Hypertension Father    Colon polyps Father    Alcohol abuse Sister    Depression Sister    Alcohol abuse Son    Drug abuse Son    Alcohol abuse Son    Drug abuse Son    Alcohol abuse Sister    Depression Sister    Drug abuse Sister    Stomach cancer Brother    Cancer  Brother    Alcohol abuse Sister    Drug abuse Sister    Early death Sister    Colon cancer Neg Hx    Esophageal cancer Neg  Hx    Rectal cancer Neg Hx     Health Maintenance  Topic Date Due   COVID-19 Vaccine (7 - 2023-24 season) 09/12/2023 (Originally 08/22/2023)   INFLUENZA VACCINE  03/20/2024 (Originally 07/22/2023)   Lung Cancer Screening  03/29/2024 (Originally 08/23/2021)   Diabetic kidney evaluation - eGFR measurement  12/25/2023   Diabetic kidney evaluation - Urine ACR  12/25/2023   HEMOGLOBIN A1C  12/29/2023   OPHTHALMOLOGY EXAM  03/24/2024   Medicare Annual Wellness (AWV)  05/18/2024   FOOT EXAM  06/27/2024   MAMMOGRAM  07/25/2025   Colonoscopy  01/22/2028   DTaP/Tdap/Td (2 - Td or Tdap) 05/22/2032   Pneumonia Vaccine 48+ Years old  Completed   DEXA SCAN  Completed   Hepatitis C Screening  Completed   Zoster Vaccines- Shingrix  Completed   HPV VACCINES  Aged Out     ----------------------------------------------------------------------------------------------------------------------------------------------------------------------------------------------------------------- Physical Exam BP 134/81 (BP Location: Left Arm, Patient Position: Sitting, Cuff Size: Large)   Pulse 92   Temp 97.8 F (36.6 C) (Oral)   Ht 5\' 9"  (1.753 m)   Wt 292 lb (132.5 kg)   SpO2 97%   BMI 43.12 kg/m   Physical Exam Constitutional:      Appearance: Normal appearance.  Eyes:     General: No scleral icterus. Cardiovascular:     Rate and Rhythm: Normal rate and regular rhythm.  Pulmonary:     Effort: Pulmonary effort is normal.     Breath sounds: Normal breath sounds.  Musculoskeletal:     Cervical back: Neck supple.  Neurological:     Mental Status: She is alert.  Psychiatric:        Mood and Affect: Mood normal.        Behavior: Behavior normal.      ------------------------------------------------------------------------------------------------------------------------------------------------------------------------------------------------------------------- Assessment and Plan  Acute cystitis She continues to have symptoms of UTI.  Culture with pansensitive E. coli previously.  Currently on Bactrim which I recommend she complete.  I did give her an injection of Rocephin today as well.  With her history of renal carcinoma will obtain CT scan of the abdomen and pelvis to rule out any obstructive lesions which may be contributing to recurrent UTI.  She does have an appointment with her urologist in a few weeks.   Meds ordered this encounter  Medications   cefTRIAXone (ROCEPHIN) injection 1 g    No follow-ups on file.    This visit occurred during the SARS-CoV-2 public health emergency.  Safety protocols were in place, including screening questions prior to the visit, additional usage of staff PPE, and extensive cleaning of exam room while observing appropriate contact time as indicated for disinfecting solutions.

## 2023-08-30 NOTE — Assessment & Plan Note (Signed)
She continues to have symptoms of UTI.  Culture with pansensitive E. coli previously.  Currently on Bactrim which I recommend she complete.  I did give her an injection of Rocephin today as well.  With her history of renal carcinoma will obtain CT scan of the abdomen and pelvis to rule out any obstructive lesions which may be contributing to recurrent UTI.  She does have an appointment with her urologist in a few weeks.

## 2023-08-31 ENCOUNTER — Encounter: Payer: Self-pay | Admitting: Family Medicine

## 2023-10-06 ENCOUNTER — Other Ambulatory Visit: Payer: Self-pay | Admitting: Family Medicine

## 2023-10-06 DIAGNOSIS — M47816 Spondylosis without myelopathy or radiculopathy, lumbar region: Secondary | ICD-10-CM

## 2023-10-06 DIAGNOSIS — F419 Anxiety disorder, unspecified: Secondary | ICD-10-CM

## 2023-10-06 NOTE — Telephone Encounter (Signed)
Patient called she requesting refills on Alprazolam 0.25mg  and Norco/Vicodin 5-325mg  Please submit to [facility] [address] [place] [address] Phone [phone]

## 2023-10-07 MED ORDER — ALPRAZOLAM 0.25 MG PO TABS
0.2500 mg | ORAL_TABLET | Freq: Two times a day (BID) | ORAL | 3 refills | Status: DC | PRN
Start: 2023-10-07 — End: 2024-04-24

## 2023-10-07 MED ORDER — HYDROCODONE-ACETAMINOPHEN 5-325 MG PO TABS: 1.0000 | ORAL_TABLET | Freq: Three times a day (TID) | ORAL | 0 refills | Status: DC | PRN

## 2023-10-11 NOTE — Telephone Encounter (Signed)
Completed on 10/07/23

## 2023-11-12 ENCOUNTER — Encounter: Payer: Self-pay | Admitting: Family Medicine

## 2023-11-12 MED ORDER — DAPAGLIFLOZIN PROPANEDIOL 10 MG PO TABS: 10.0000 mg | ORAL_TABLET | Freq: Every day | ORAL | 3 refills | Status: DC

## 2023-11-12 MED ORDER — TIRZEPATIDE 10 MG/0.5ML ~~LOC~~ SOAJ: 10.0000 mg | SUBCUTANEOUS | 1 refills | Status: DC

## 2023-11-15 ENCOUNTER — Other Ambulatory Visit: Payer: Self-pay | Admitting: Family Medicine

## 2023-11-15 DIAGNOSIS — E1169 Type 2 diabetes mellitus with other specified complication: Secondary | ICD-10-CM

## 2023-11-15 DIAGNOSIS — E1165 Type 2 diabetes mellitus with hyperglycemia: Secondary | ICD-10-CM

## 2023-11-23 ENCOUNTER — Encounter: Payer: Self-pay | Admitting: Sports Medicine

## 2023-11-23 ENCOUNTER — Ambulatory Visit (INDEPENDENT_AMBULATORY_CARE_PROVIDER_SITE_OTHER): Payer: Medicare Other | Admitting: Sports Medicine

## 2023-11-23 ENCOUNTER — Telehealth: Payer: Self-pay | Admitting: Sports Medicine

## 2023-11-23 ENCOUNTER — Other Ambulatory Visit (INDEPENDENT_AMBULATORY_CARE_PROVIDER_SITE_OTHER): Payer: Medicare Other

## 2023-11-23 DIAGNOSIS — M79672 Pain in left foot: Secondary | ICD-10-CM

## 2023-11-23 DIAGNOSIS — M170 Bilateral primary osteoarthritis of knee: Secondary | ICD-10-CM

## 2023-11-23 MED ORDER — TRIAMCINOLONE ACETONIDE 40 MG/ML IJ SUSP
80.0000 mg | Freq: Once | INTRAMUSCULAR | Status: AC
Start: 2023-11-23 — End: 2023-11-23
  Administered 2023-11-23: 80 mg via INTRAMUSCULAR

## 2023-11-23 MED ORDER — TRIAMCINOLONE ACETONIDE 0.1 % EX CREA: 1.0000 | TOPICAL_CREAM | Freq: Two times a day (BID) | CUTANEOUS | 0 refills | Status: DC

## 2023-11-23 NOTE — Addendum Note (Signed)
Addended by: Carren Rang A on: 11/23/2023 03:02 PM   Modules accepted: Orders

## 2023-11-23 NOTE — Assessment & Plan Note (Signed)
Pain between the fourth and fifth toes, there is skin maceration in the web/interspace. Tenderness at this location. Adding topical triamcinolone and she will keep a gauze spacer in between for now, this is medical and can be followed up with her PCP.

## 2023-11-23 NOTE — Assessment & Plan Note (Signed)
Did well with steroid injection back in May, recurrence of pain, repeat steroid injection bilateral today. We will also work on [employer] per her request.

## 2023-11-23 NOTE — Telephone Encounter (Signed)
Bilateral Orthovisc approval please

## 2023-11-23 NOTE — Progress Notes (Signed)
    Procedures performed today:    Procedure: Real-time Ultrasound Guided injection of the left knee Device: Samsung HS60  Verbal informed consent obtained.  Time-out conducted.  Noted no overlying erythema, induration, or other signs of local infection.  Skin prepped in a sterile fashion.  Local anesthesia: Topical Ethyl chloride.  With sterile technique and under real time ultrasound guidance: Mild effusion, 1 cc Kenalog 40, 2 cc lidocaine, 2 cc bupivacaine injected easily Completed without difficulty  Advised to call if fevers/chills, erythema, induration, drainage, or persistent bleeding.  Images permanently stored and available for review in PACS.  Impression: Technically successful ultrasound guided injection.  Procedure: Real-time Ultrasound Guided injection of the right knee Device: Samsung HS60  Verbal informed consent obtained.  Time-out conducted.  Noted no overlying erythema, induration, or other signs of local infection.  Skin prepped in a sterile fashion.  Local anesthesia: Topical Ethyl chloride.  With sterile technique and under real time ultrasound guidance: Mild effusion, 1 cc Kenalog 40, 2 cc lidocaine, 2 cc bupivacaine injected easily Completed without difficulty  Advised to call if fevers/chills, erythema, induration, drainage, or persistent bleeding.  Images permanently stored and available for review in PACS.  Impression: Technically successful ultrasound guided injection.  Independent interpretation of notes and tests performed by another provider:   None.  Brief History, Exam, Impression, and Recommendations:    Primary osteoarthritis of both knees Did well with steroid injection back in May, recurrence of pain, repeat steroid injection bilateral today. We will also work on [employer] approval per her request.  Foot pain, left Pain between the fourth and fifth toes, there is skin maceration in the web/interspace. Tenderness at this location. Adding  topical triamcinolone and she will keep a gauze spacer in between for now, this is medical and can be followed up with her PCP.    ____________________________________________ Bonnie [name]. Benjamin Stain, M.D., ABFM., CAQSM., AME. Primary Care and Sports Medicine [facility]  Adjunct Professor of Family Medicine  [place] [employer] [facility] [employer]  Restaurant manager, fast food

## 2023-11-24 NOTE — Telephone Encounter (Signed)
PA information submitted via [email or web address] for Orthovisc Paperwork has been printed and given to Dr. T for signatures. Once obtained, information will be faxed to [employer] at [phone]  

## 2023-12-01 NOTE — Telephone Encounter (Signed)
There was an error with her insurance and had to upload it again.

## 2023-12-29 ENCOUNTER — Encounter: Payer: Self-pay | Admitting: Family Medicine

## 2023-12-29 ENCOUNTER — Ambulatory Visit: Payer: Medicare Other | Admitting: Family Medicine

## 2023-12-29 VITALS — BP 121/76 | HR 85 | Ht 69.0 in | Wt 281.0 lb

## 2023-12-29 DIAGNOSIS — Z7985 Long-term (current) use of injectable non-insulin antidiabetic drugs: Secondary | ICD-10-CM

## 2023-12-29 DIAGNOSIS — G8929 Other chronic pain: Secondary | ICD-10-CM

## 2023-12-29 DIAGNOSIS — E785 Hyperlipidemia, unspecified: Secondary | ICD-10-CM

## 2023-12-29 DIAGNOSIS — E1169 Type 2 diabetes mellitus with other specified complication: Secondary | ICD-10-CM | POA: Diagnosis not present

## 2023-12-29 DIAGNOSIS — E1165 Type 2 diabetes mellitus with hyperglycemia: Secondary | ICD-10-CM

## 2023-12-29 DIAGNOSIS — I10 Essential (primary) hypertension: Secondary | ICD-10-CM | POA: Diagnosis not present

## 2023-12-29 DIAGNOSIS — M79675 Pain in left toe(s): Secondary | ICD-10-CM

## 2023-12-29 DIAGNOSIS — F419 Anxiety disorder, unspecified: Secondary | ICD-10-CM

## 2023-12-29 LAB — POCT UA - MICROALBUMIN
Creatinine, POC: 300 mg/dL
Microalbumin Ur, POC: 80 mg/L

## 2023-12-29 LAB — POCT GLYCOSYLATED HEMOGLOBIN (HGB A1C): HbA1c, POC (controlled diabetic range): 5.6 % (ref 0.0–7.0)

## 2023-12-29 MED ORDER — BUPROPION HCL ER (XL) 150 MG PO TB24: ORAL_TABLET | ORAL | 1 refills | Status: DC

## 2023-12-29 MED ORDER — TIRZEPATIDE 10 MG/0.5ML ~~LOC~~ SOAJ: 10.0000 mg | SUBCUTANEOUS | 1 refills | Status: DC

## 2023-12-29 MED ORDER — ENALAPRIL MALEATE 10 MG PO TABS
10.0000 mg | ORAL_TABLET | Freq: Every day | ORAL | 2 refills | Status: DC
Start: 2023-12-29 — End: 2024-07-11

## 2023-12-29 MED ORDER — ATORVASTATIN CALCIUM 80 MG PO TABS
80.0000 mg | ORAL_TABLET | Freq: Every day | ORAL | 1 refills | Status: DC
Start: 2023-12-29 — End: 2024-07-11

## 2023-12-29 NOTE — Assessment & Plan Note (Signed)
 Blood sugars are well-controlled.  She is doing well with Mounjaro at current strength.  Will plan to continue this.Marland Kitchen

## 2023-12-29 NOTE — Assessment & Plan Note (Signed)
 Continue Effexor at current strength.  Has active prescription for alprazolam.  She rarely uses this.  She is aware she should not use this with her hydrocodone.

## 2023-12-29 NOTE — Assessment & Plan Note (Signed)
 Blood pressure is well-controlled today.  She denies symptoms related to hypotension.  She will continue current medications for management of hypertension.

## 2023-12-29 NOTE — Progress Notes (Signed)
 EVANGELIA [name] - 73 y.o. female MRN [id number]  Date of birth: 1951/10/21  Subjective Chief Complaint  Patient presents with   Diabetes    HPI Bonnie [name] is a 73 y.o. female here today for follow up visit.   She reports that she is having some pain in the interdigital space between the 4th and 5th toes on the L foot.  Dr. Curtis prescribed topical triamcinolone  but she is now having increased pain.  She denies drainage from the foot.   She remains on mounjaro  and farxiga  for management of diabetes.  Doing well at current strength.  Weight is stable.  She denies significant side effects at this time. Remains on atorvastatin  and zetia  for management of HLD.    BP remains well controlled with maxzide  and enalapril .  No side effects at this time.  She denies chest pain, shortness of breath, palpitations, headache or vision changes.   Effexor  with occasional alprazolam  use.  This is working pretty well for her.  Her son did recently let her know that he is seeking treatment for his EtOH use which she feels will help with her constant worry.   ROS:  A comprehensive ROS was completed and negative except as noted per HPI  Allergies  Allergen Reactions   Latex Itching and Rash   Metformin  And Related Other (See Comments)    Malaise, fatigue   Morphine And Codeine Rash and Other (See Comments)    Agitation   Tape Swelling and Other (See Comments)    Burning sensation   Tramadol Palpitations    Heart fluttering    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   Chronic low back pain    Colon polyps    Depression    Diabetes mellitus without complication (HCC)    Fatty liver    Hypertension    Melanoma (HCC)    R upper extremity    Melanoma in situ of right upper extremity (HCC)    Mild diastolic dysfunction 07/09/2019   Osteoarthritis of lumbar spine    Renal cancer (HCC)    Skin cancer    Sleep apnea 2008   cpap nightly    Past Surgical History:  Procedure Laterality Date   ABDOMINAL HYSTERECTOMY     ANKLE SURGERY Left    in her 30s   BILE DUCT STENT PLACEMENT     CARDIAC CATHETERIZATION     CHOLECYSTECTOMY     COLONOSCOPY     FRACTURE SURGERY  2005   Back, Ankle in 1980's   JOINT REPLACEMENT  2021   Left Hip   LEFT HEART CATH AND CORONARY ANGIOGRAPHY N/A 09/05/2019   Procedure: LEFT HEART CATH AND CORONARY ANGIOGRAPHY;  Surgeon: Anner Alm ORN, MD;  Location: [facility] INVASIVE CV LAB;  Service: Cardiovascular;  Laterality: N/A;   LUMBAR FUSION     7 level    MOHS SURGERY     x 11   PARTIAL NEPHRECTOMY     POLYPECTOMY     SPINE SURGERY  2005   7 fused vertebra   TOTAL HIP ARTHROPLASTY Left 03/01/2020   Procedure: LEFT TOTAL HIP ARTHROPLASTY ANTERIOR APPROACH;  Surgeon: Vernetta Lonni GRADE, MD;  Location: [facility];  Service: Orthopedics;  Laterality: Left;   UPPER GASTROINTESTINAL ENDOSCOPY      Social History   Socioeconomic History   Marital status: Married    Spouse name: Darrell   Number of children: 3   Years of education: 12   Highest education level: Some college, no degree  Occupational History   Occupation: Retired  Tobacco Use   Smoking status: Former    Current packs/day: 0.00    Average packs/day: 1 pack/day for 40.0 years (40.0 ttl pk-yrs)    Types: Cigarettes    Start date: 12/21/1970    Quit date: 12/21/2010    Years since quitting: 13.0   Smokeless tobacco: Never  Vaping Use   Vaping status: Former   Devices: 2012-2014 while quitting cigaretter  Substance and Sexual Activity   Alcohol use: Never    Alcohol/week: 1.0 - 2.0 standard drink of alcohol    Types: 1 - 2 Standard drinks or equivalent per week   Drug use: Yes    Frequency: 3.0 times per week    Types:  Hydrocodone    Sexual activity: Not Currently    Birth control/protection: Surgical, None  Other Topics Concern   Not on file  Social History Narrative   Lives with her husband. She has two daughters and one son. She enjoys gardening and reading.    Social Drivers of Corporate Investment Banker Strain: Low Risk  (12/25/2023)   Overall Financial Resource Strain (CARDIA)    Difficulty of Paying Living Expenses: Not hard at all  Food Insecurity: No Food Insecurity (12/25/2023)   Hunger Vital Sign    Worried About Running Out of Food in the Last Year: Never true    Ran Out of Food in the Last Year: Never true  Transportation Needs: No Transportation Needs (12/25/2023)   PRAPARE - Administrator, Civil Service (Medical): No    Lack of Transportation (Non-Medical): No  Physical Activity: Insufficiently Active (12/25/2023)   Exercise Vital Sign    Days of Exercise per Week: 1 day    Minutes of Exercise per Session: 10 min  Stress: No Stress Concern Present (12/25/2023)   [employer] of Occupational Health - Occupational Stress Questionnaire    Feeling of Stress : Only a little  Social Connections: Moderately Isolated (12/25/2023)   Social Connection and Isolation Panel [NHANES]    Frequency of Communication with Friends and Family: Three times a week    Frequency of Social Gatherings with Friends and Family: Once a week    Attends Religious Services: Never    Database Administrator or Organizations: No    Attends Engineer, Structural: Never    Marital Status: Married    Family History  Problem Relation Age of Onset   Depression Mother    Hyperlipidemia Mother    Hypertension Mother    Colon polyps Mother    Kidney disease Mother    Obesity Mother    Heart attack Father    Hyperlipidemia Father    Hypertension Father    Colon polyps Father    Alcohol abuse Sister    Depression Sister    Alcohol abuse Son    Drug abuse Son    Alcohol abuse Son    Drug abuse  Son    Alcohol abuse Sister    Depression Sister    Drug abuse Sister    Stomach cancer Brother    Cancer Brother    Alcohol  abuse Sister    Drug abuse Sister    Early death Sister    Colon cancer Neg Hx    Esophageal cancer Neg Hx    Rectal cancer Neg Hx     Health Maintenance  Topic Date Due   Diabetic kidney evaluation - eGFR measurement  12/25/2023   Lung Cancer Screening  03/29/2024 (Originally 08/23/2021)   COVID-19 Vaccine (7 - 2024-25 season) 01/13/2025 (Originally 08/22/2023)   OPHTHALMOLOGY EXAM  03/24/2024   Medicare Annual Wellness (AWV)  05/18/2024   FOOT EXAM  06/27/2024   HEMOGLOBIN A1C  06/27/2024   Diabetic kidney evaluation - Urine ACR  12/28/2024   MAMMOGRAM  07/25/2025   Colonoscopy  01/22/2028   DTaP/Tdap/Td (2 - Td or Tdap) 05/22/2032   Pneumonia Vaccine 64+ Years old  Completed   INFLUENZA VACCINE  Completed   DEXA SCAN  Completed   Hepatitis C Screening  Completed   Zoster Vaccines- Shingrix   Completed   HPV VACCINES  Aged Out     ----------------------------------------------------------------------------------------------------------------------------------------------------------------------------------------------------------------- Physical Exam BP 121/76 (BP Location: Left Arm, Patient Position: Sitting, Cuff Size: Large)   Pulse 85   Ht 5' 9 (1.753 m)   Wt 281 lb (127.5 kg)   SpO2 97%   BMI 41.50 kg/m   Physical Exam Constitutional:      Appearance: Normal appearance.  Eyes:     General: No scleral icterus. Cardiovascular:     Rate and Rhythm: Normal rate and regular rhythm.  Pulmonary:     Effort: Pulmonary effort is normal.     Breath sounds: Normal breath sounds.  Musculoskeletal:     Cervical back: Neck supple.  Skin:    Comments: Small maceration between the fourth and fifth digit of left foot.  There is significant tenderness in the interdigital space.  Neurological:     Mental Status: She is alert.  Psychiatric:         Mood and Affect: Mood normal.        Behavior: Behavior normal.     ------------------------------------------------------------------------------------------------------------------------------------------------------------------------------------------------------------------- Assessment and Plan  Anxiety Continue Effexor  at current strength.  Has active prescription for alprazolam .  She rarely uses this.  She is aware she should not use this with her hydrocodone .  Essential hypertension Blood pressure is well-controlled today.  She denies symptoms related to hypotension.  She will continue current medications for management of hypertension.  Type 2 diabetes mellitus with hyperglycemia, without long-term current use of insulin (HCC) Blood sugars are well-controlled.  She is doing well with Mounjaro  at current strength.  Will plan to continue this..   Meds ordered this encounter  Medications   atorvastatin  (LIPITOR) 80 MG tablet    Sig: Take 1 tablet (80 mg total) by mouth daily.    Dispense:  90 tablet    Refill:  1   buPROPion  (WELLBUTRIN  XL) 150 MG 24 hr tablet    Sig: TAKE 1 TABLET EVERY MORNING    Dispense:  90 tablet    Refill:  1   enalapril  (VASOTEC ) 10 MG tablet    Sig: Take 1 tablet (10 mg total) by mouth daily.    Dispense:  90 tablet    Refill:  2   DISCONTD: tirzepatide  (MOUNJARO ) 10 MG/0.5ML Pen    Sig: Inject 10 mg into the skin once a week.    Dispense:  6 mL    Refill:  1   tirzepatide  (MOUNJARO ) 10 MG/0.5ML Pen    Sig: Inject 10 mg into the skin  once a week.    Dispense:  6 mL    Refill:  1    Return in about 6 months (around 06/27/2024) for Type 2 Diabetes, Hypertension.    This visit occurred during the SARS-CoV-2 public health emergency.  Safety protocols were in place, including screening questions prior to the visit, additional usage of staff PPE, and extensive cleaning of exam room while observing appropriate contact time as indicated for  disinfecting solutions.

## 2023-12-31 ENCOUNTER — Encounter: Payer: Self-pay | Admitting: Podiatry

## 2023-12-31 ENCOUNTER — Ambulatory Visit (INDEPENDENT_AMBULATORY_CARE_PROVIDER_SITE_OTHER): Payer: Medicare Other | Admitting: Podiatry

## 2023-12-31 DIAGNOSIS — L600 Ingrowing nail: Secondary | ICD-10-CM | POA: Diagnosis not present

## 2023-12-31 DIAGNOSIS — L84 Corns and callosities: Secondary | ICD-10-CM

## 2023-12-31 NOTE — Progress Notes (Signed)
  Subjective:  Patient ID: Bonnie [name], female    DOB: 1951-07-05,   MRN: [id number]  No chief complaint on file.   74 y.o. female presents for concern  of left fourth and fifth toe pain. Relates no pain with pressure or walking but when she puts lotion in her toe she was getting a lot of pain or if you press inside the toe area it is painful. She also relates concern for reoccurrence of right ingrown toenail. She feels its is coming back and wondering about options. . Denies any other pedal complaints. Denies n/v/f/c.   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 CFT <3 seconds. Normal hair growth on digits. No edema.  Skin. No lacerations or abrasions bilateral feet. Hyperkeratosis of the sulcus of fourth interspace near fifth toe. Incurvation of the medial border of the right hallux with some tenderness. No erythema edema or purulence noted.  Musculoskeletal: MMT 5/5 bilateral lower extremities in DF, PF, Inversion and Eversion. Deceased ROM in DF of ankle joint.  Neurological: Sensation intact to light touch.   Assessment:   1. Heloma molle   2. Ingrown right greater toenail      Plan:  Patient was evaluated and treated and all questions answered. -Discussed benign skin lesions and helloma  molle.  with patient and treatment options.  -Hyperkeratotic tissue was debrided with chisel without incident as courtesy., .  -Advised good supportive shoes and inserts -Discussed ingrown toenails and treatment optiosn.  Discussed removal vs conservative soaks neosporin and trimming.  -Patient to return to office as needed or sooner if condition worsens.   Asberry Failing, DPM

## 2024-03-03 ENCOUNTER — Ambulatory Visit (INDEPENDENT_AMBULATORY_CARE_PROVIDER_SITE_OTHER): Admitting: Sports Medicine

## 2024-03-03 ENCOUNTER — Other Ambulatory Visit (INDEPENDENT_AMBULATORY_CARE_PROVIDER_SITE_OTHER): Payer: Self-pay

## 2024-03-03 ENCOUNTER — Encounter: Payer: Self-pay | Admitting: Sports Medicine

## 2024-03-03 DIAGNOSIS — M170 Bilateral primary osteoarthritis of knee: Secondary | ICD-10-CM

## 2024-03-03 MED ORDER — HYALURONAN 30 MG/2ML IX SOSY
30.0000 mg | PREFILLED_SYRINGE | Freq: Once | INTRA_ARTICULAR | Status: AC
Start: 2024-03-03 — End: 2024-03-03
  Administered 2024-03-03: 30 mg via INTRA_ARTICULAR

## 2024-03-03 NOTE — Assessment & Plan Note (Signed)
 Orthovisc 1 of 4 both knees, return in 1 week for 2 of 4 both knees.

## 2024-03-03 NOTE — Progress Notes (Signed)
    Procedures performed today:    Procedure: Real-time Ultrasound Guided injection of the left knee Device: Samsung HS60  Verbal informed consent obtained.  Time-out conducted.  Noted no overlying erythema, induration, or other signs of local infection.  Skin prepped in a sterile fashion.  Local anesthesia: Topical Ethyl chloride.  With sterile technique and under real time ultrasound guidance: trace effusion noted, 30 mg/2 mL of OrthoVisc (sodium hyaluronate) in a prefilled syringe was injected easily into the knee through a 22-gauge needle.  Completed without difficulty  Advised to call if fevers/chills, erythema, induration, drainage, or persistent bleeding.  Images permanently stored and available for review in PACS.  Impression: Technically successful ultrasound guided injection.   Procedure: Real-time Ultrasound Guided injection of the right knee Device: Samsung HS60  Verbal informed consent obtained.  Time-out conducted.  Noted no overlying erythema, induration, or other signs of local infection.  Skin prepped in a sterile fashion.  Local anesthesia: Topical Ethyl chloride.  With sterile technique and under real time ultrasound guidance: Trace effusion noted, 30 mg/2 mL of OrthoVisc (sodium hyaluronate) in a prefilled syringe was injected easily into the knee through a 22-gauge needle. Completed without difficulty  Advised to call if fevers/chills, erythema, induration, drainage, or persistent bleeding.  Images permanently stored and available for review in PACS.  Impression: Technically successful ultrasound guided injection.  Independent interpretation of notes and tests performed by another provider:   None.  Brief History, Exam, Impression, and Recommendations:    Primary osteoarthritis of both knees Orthovisc 1 of 4 both knees, return in 1 week for 2 of 4 both knees.    ____________________________________________ Ihor Austin. Benjamin Stain, M.D., ABFM., CAQSM.,  AME. Primary Care and Sports Medicine [facility]  Adjunct Professor of Family Medicine  [place] [employer] [facility] [employer]  Restaurant manager, fast food

## 2024-03-03 NOTE — Addendum Note (Signed)
 Addended by: Samule Dry on: 03/03/2024 11:15 AM   Modules accepted: Orders

## 2024-03-10 ENCOUNTER — Other Ambulatory Visit (INDEPENDENT_AMBULATORY_CARE_PROVIDER_SITE_OTHER): Payer: Self-pay

## 2024-03-10 ENCOUNTER — Ambulatory Visit: Admitting: Sports Medicine

## 2024-03-10 ENCOUNTER — Encounter: Payer: Self-pay | Admitting: Sports Medicine

## 2024-03-10 DIAGNOSIS — M170 Bilateral primary osteoarthritis of knee: Secondary | ICD-10-CM | POA: Diagnosis not present

## 2024-03-10 MED ORDER — HYALURONAN 30 MG/2ML IX SOSY
30.0000 mg | PREFILLED_SYRINGE | Freq: Once | INTRA_ARTICULAR | Status: AC
Start: 2024-03-10 — End: 2024-03-10
  Administered 2024-03-10: 30 mg via INTRA_ARTICULAR

## 2024-03-10 NOTE — Addendum Note (Signed)
 Addended by: Samule Dry on: 03/10/2024 01:09 PM   Modules accepted: Orders

## 2024-03-10 NOTE — Assessment & Plan Note (Signed)
 Orthovisc 2 of 4 both knees, return in 1 week for #3 of 4 both knees.

## 2024-03-10 NOTE — Progress Notes (Signed)
    Procedures performed today:    Procedure: Real-time Ultrasound Guided injection of the left knee Device: Samsung HS60  Verbal informed consent obtained.  Time-out conducted.  Noted no overlying erythema, induration, or other signs of local infection.  Skin prepped in a sterile fashion.  Local anesthesia: Topical Ethyl chloride.  With sterile technique and under real time ultrasound guidance: trace effusion noted, 30 mg/2 mL of OrthoVisc (sodium hyaluronate) in a prefilled syringe was injected easily into the knee through a 22-gauge needle.  Completed without difficulty  Advised to call if fevers/chills, erythema, induration, drainage, or persistent bleeding.  Images permanently stored and available for review in PACS.  Impression: Technically successful ultrasound guided injection.   Procedure: Real-time Ultrasound Guided injection of the right knee Device: Samsung HS60  Verbal informed consent obtained.  Time-out conducted.  Noted no overlying erythema, induration, or other signs of local infection.  Skin prepped in a sterile fashion.  Local anesthesia: Topical Ethyl chloride.  With sterile technique and under real time ultrasound guidance: Trace effusion noted, 30 mg/2 mL of OrthoVisc (sodium hyaluronate) in a prefilled syringe was injected easily into the knee through a 22-gauge needle. Completed without difficulty  Advised to call if fevers/chills, erythema, induration, drainage, or persistent bleeding.  Images permanently stored and available for review in PACS.  Impression: Technically successful ultrasound guided injection.  Independent interpretation of notes and tests performed by another provider:   None.  Brief History, Exam, Impression, and Recommendations:    Primary osteoarthritis of both knees Orthovisc 2 of 4 both knees, return in 1 week for #3 of 4 both knees.    ____________________________________________ Ihor Austin. Benjamin Stain, M.D., ABFM., CAQSM.,  AME. Primary Care and Sports Medicine [facility]  Adjunct Professor of Family Medicine  [place] [employer] [facility] [employer]  Restaurant manager, fast food

## 2024-03-17 ENCOUNTER — Ambulatory Visit (INDEPENDENT_AMBULATORY_CARE_PROVIDER_SITE_OTHER): Admitting: Sports Medicine

## 2024-03-17 ENCOUNTER — Other Ambulatory Visit (INDEPENDENT_AMBULATORY_CARE_PROVIDER_SITE_OTHER): Payer: Self-pay

## 2024-03-17 ENCOUNTER — Encounter: Payer: Self-pay | Admitting: Sports Medicine

## 2024-03-17 DIAGNOSIS — M170 Bilateral primary osteoarthritis of knee: Secondary | ICD-10-CM

## 2024-03-17 MED ORDER — HYALURONAN 30 MG/2ML IX SOSY
30.0000 mg | PREFILLED_SYRINGE | Freq: Once | INTRA_ARTICULAR | Status: AC
Start: 2024-03-17 — End: 2024-03-17
  Administered 2024-03-17: 30 mg via INTRA_ARTICULAR

## 2024-03-17 NOTE — Progress Notes (Signed)
    Procedures performed today:    Procedure: Real-time Ultrasound Guided injection of the left knee Device: Samsung HS60  Verbal informed consent obtained.  Time-out conducted.  Noted no overlying erythema, induration, or other signs of local infection.  Skin prepped in a sterile fashion.  Local anesthesia: Topical Ethyl chloride.  With sterile technique and under real time ultrasound guidance: trace effusion noted, 30 mg/2 mL of OrthoVisc (sodium hyaluronate) in a prefilled syringe was injected easily into the knee through a 22-gauge needle.  Completed without difficulty  Advised to call if fevers/chills, erythema, induration, drainage, or persistent bleeding.  Images permanently stored and available for review in PACS.  Impression: Technically successful ultrasound guided injection.   Procedure: Real-time Ultrasound Guided injection of the right knee Device: Samsung HS60  Verbal informed consent obtained.  Time-out conducted.  Noted no overlying erythema, induration, or other signs of local infection.  Skin prepped in a sterile fashion.  Local anesthesia: Topical Ethyl chloride.  With sterile technique and under real time ultrasound guidance: Trace effusion noted, 30 mg/2 mL of OrthoVisc (sodium hyaluronate) in a prefilled syringe was injected easily into the knee through a 22-gauge needle. Completed without difficulty  Advised to call if fevers/chills, erythema, induration, drainage, or persistent bleeding.  Images permanently stored and available for review in PACS.  Impression: Technically successful ultrasound guided injection.  Independent interpretation of notes and tests performed by another provider:   None.  Brief History, Exam, Impression, and Recommendations:    Primary osteoarthritis of both knees Orthovisc 3 of 4 both knees, return in 1 week for #4    ____________________________________________ Ihor Austin. Benjamin Stain, M.D., ABFM., CAQSM., AME. Primary Care  and Sports Medicine [facility]  Adjunct Professor of Family Medicine  [place] [employer]  Restaurant manager, fast food

## 2024-03-17 NOTE — Assessment & Plan Note (Signed)
 Orthovisc 3 of 4 both knees, return in 1 week for #4

## 2024-03-17 NOTE — Addendum Note (Signed)
 Addended by: Samule Dry on: 03/17/2024 10:33 AM   Modules accepted: Orders

## 2024-03-22 ENCOUNTER — Ambulatory Visit (INDEPENDENT_AMBULATORY_CARE_PROVIDER_SITE_OTHER): Admitting: Sports Medicine

## 2024-03-22 ENCOUNTER — Other Ambulatory Visit (INDEPENDENT_AMBULATORY_CARE_PROVIDER_SITE_OTHER): Payer: Self-pay

## 2024-03-22 ENCOUNTER — Encounter: Payer: Self-pay | Admitting: Sports Medicine

## 2024-03-22 DIAGNOSIS — M170 Bilateral primary osteoarthritis of knee: Secondary | ICD-10-CM

## 2024-03-22 MED ORDER — HYALURONAN 30 MG/2ML IX SOSY
30.0000 mg | PREFILLED_SYRINGE | Freq: Once | INTRA_ARTICULAR | Status: AC
Start: 2024-03-22 — End: 2024-03-22
  Administered 2024-03-22: 30 mg via INTRA_ARTICULAR

## 2024-03-22 NOTE — Progress Notes (Signed)
    Procedures performed today:    Procedure: Real-time Ultrasound Guided injection of the left knee Device: Samsung HS60  Verbal informed consent obtained.  Time-out conducted.  Noted no overlying erythema, induration, or other signs of local infection.  Skin prepped in a sterile fashion.  Local anesthesia: Topical Ethyl chloride.  With sterile technique and under real time ultrasound guidance: trace effusion noted, 30 mg/2 mL of OrthoVisc (sodium hyaluronate) in a prefilled syringe was injected easily into the knee through a 22-gauge needle.  Completed without difficulty  Advised to call if fevers/chills, erythema, induration, drainage, or persistent bleeding.  Images permanently stored and available for review in PACS.  Impression: Technically successful ultrasound guided injection.   Procedure: Real-time Ultrasound Guided injection of the right knee Device: Samsung HS60  Verbal informed consent obtained.  Time-out conducted.  Noted no overlying erythema, induration, or other signs of local infection.  Skin prepped in a sterile fashion.  Local anesthesia: Topical Ethyl chloride.  With sterile technique and under real time ultrasound guidance: Trace effusion noted, 30 mg/2 mL of OrthoVisc (sodium hyaluronate) in a prefilled syringe was injected easily into the knee through a 22-gauge needle. Completed without difficulty  Advised to call if fevers/chills, erythema, induration, drainage, or persistent bleeding.  Images permanently stored and available for review in PACS.  Impression: Technically successful ultrasound guided injection.  Independent interpretation of notes and tests performed by another provider:   None.  Brief History, Exam, Impression, and Recommendations:    Primary osteoarthritis of both knees Orthovisc 4 of 4 both knees, return in 6 weeks as needed.    ____________________________________________ Ihor Austin. Benjamin Stain, M.D., ABFM., CAQSM., AME. Primary  Care and Sports Medicine [facility]  Adjunct Professor of Family Medicine  [employer] [facility] [employer] [facility] [employer]  Restaurant manager, fast food

## 2024-03-22 NOTE — Assessment & Plan Note (Signed)
Orthovisc 4 of 4 both knees, return in 6 weeks as needed

## 2024-03-22 NOTE — Addendum Note (Signed)
 Addended by: Samule Dry on: 03/22/2024 11:50 AM   Modules accepted: Orders

## 2024-03-27 ENCOUNTER — Other Ambulatory Visit: Payer: Self-pay | Admitting: Family Medicine

## 2024-04-06 ENCOUNTER — Ambulatory Visit (INDEPENDENT_AMBULATORY_CARE_PROVIDER_SITE_OTHER)

## 2024-04-06 VITALS — Ht 69.0 in | Wt 262.0 lb

## 2024-04-06 DIAGNOSIS — Z0000 Encounter for general adult medical examination without abnormal findings: Secondary | ICD-10-CM | POA: Diagnosis not present

## 2024-04-06 NOTE — Progress Notes (Signed)
 Subjective:   Bonnie [name] is a 73 y.o. female who presents for Medicare Annual (Subsequent) preventive examination.  Visit Complete: Virtual I connected with  Bonnie [name] on 04/06/24 by a audio enabled telemedicine application and verified that I am speaking with the correct person using two identifiers.  Patient Location: Home  Provider Location: Office/Clinic  I discussed the limitations of evaluation and management by telemedicine. The patient expressed understanding and agreed to proceed.  Vital Signs: Because this visit was a virtual/telehealth visit, some criteria may be missing or patient reported. Any vitals not documented were not able to be obtained and vitals that have been documented are patient reported.  Patient Medicare AWV questionnaire was completed by the patient on n/a; I have confirmed that all information answered by patient is correct and no changes since this date.  Cardiac Risk Factors include: advanced age (>12men, >40 women);obesity (BMI >30kg/m2);smoking/ tobacco exposure;family history of premature cardiovascular disease;diabetes mellitus;dyslipidemia;hypertension     Objective:    Today's Vitals   04/06/24 0957  Weight: 262 lb (118.8 kg)  Height: 5\' 9"  (1.753 m)   Body mass index is 38.69 kg/m.     04/06/2024   10:10 AM 05/04/2023    2:04 PM 03/30/2023    9:46 AM 03/01/2020   12:00 PM 02/16/2020   12:31 PM 10/27/2019   12:03 PM 09/05/2019    8:13 AM  Advanced Directives  Does Patient Have a Medical Advance Directive? Yes Yes Yes No No No Yes  Type of Estate agent of [place];Living will Healthcare Power of [place];Living will Living will    Living will  Does patient want to make changes to medical advance directive? No - Patient declined  No - Patient declined    No - Patient declined  Copy of Healthcare Power of Attorney in Chart? Yes - validated most recent copy scanned in chart (See row information) No - copy requested        Would patient like information on creating a medical advance directive?    No - Patient declined  No - Patient declined     Current Medications (verified) Outpatient Encounter Medications as of 04/06/2024  Medication Sig   ALPRAZolam (XANAX) 0.25 MG tablet Take 1-2 tablets (0.25-0.5 mg total) by mouth 2 (two) times daily as needed for anxiety or sleep.   aspirin 81 MG chewable tablet Chew by mouth daily.   atorvastatin (LIPITOR) 80 MG tablet Take 1 tablet (80 mg total) by mouth daily.   blood glucose meter kit and supplies KIT Check morning fasting blood glucose and up to four times daily as directed.   buPROPion (WELLBUTRIN XL) 150 MG 24 hr tablet TAKE 1 TABLET EVERY MORNING   Cholecalciferol (VITAMIN D3) 50 MCG (2000 UT) TABS Take 2,000 Units by mouth daily.   dapagliflozin propanediol (FARXIGA) 10 MG TABS tablet Take 1 tablet (10 mg total) by mouth daily before breakfast.   enalapril (VASOTEC) 10 MG tablet Take 1 tablet (10 mg total) by mouth daily.   ezetimibe (ZETIA) 10 MG tablet Take 1 tablet (10 mg total) by mouth daily.   fluticasone (FLONASE) 50 MCG/ACT nasal spray Place 2 sprays into both nostrils daily as needed for allergies or rhinitis.    HYDROcodone-acetaminophen (NORCO/VICODIN) 5-325 MG tablet Take 1 tablet by mouth every 8 (eight) hours as needed for moderate pain (pain score 4-6) or severe pain (pain score 7-10).   Lancets (ONETOUCH DELICA PLUS LANCET33G) MISC USE AS DIRECTED UP TO  4    TIMES DAILY   Multiple Vitamin tablet Take 1 tablet by mouth daily.    Omega-3 Fatty Acids (FISH OIL) 1000 MG CAPS Take 1,000 mg by mouth daily.    ONETOUCH VERIO test strip USE UP TO 4 TIMES A DAY AS DIRECTED WITH GLUCOMETER   tirzepatide (MOUNJARO) 10 MG/0.5ML Pen INJECT 10 MG INTO THE SKIN ONE TIME PER WEEK   traZODone (DESYREL) 50 MG tablet Take 1 tablet (50 mg total) by mouth at bedtime.   triamterene-hydrochlorothiazide (MAXZIDE-25) 37.5-25 MG tablet Take 1 tablet by mouth daily.    venlafaxine XR (EFFEXOR-XR) 75 MG 24 hr capsule Take 1 capsule (75 mg total) by mouth daily.   No facility-administered encounter medications on file as of 04/06/2024.    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   Chronic low back pain    Colon polyps    Depression    Diabetes mellitus without complication (HCC)    Fatty liver    Hypertension    Melanoma (HCC)    R upper extremity   Melanoma in situ of right upper extremity (HCC)    Mild diastolic dysfunction 07/09/2019   Osteoarthritis of lumbar spine    Renal cancer (HCC)    Skin cancer    Sleep apnea 2008   cpap nightly   Past Surgical History:  Procedure Laterality Date   ABDOMINAL HYSTERECTOMY     ANKLE SURGERY Left    in her 30s   BILE DUCT STENT PLACEMENT     CARDIAC CATHETERIZATION     CHOLECYSTECTOMY     COLONOSCOPY     FRACTURE SURGERY  2005   Back, Ankle in 1980's   JOINT REPLACEMENT  2021   Left Hip   LEFT HEART CATH AND CORONARY ANGIOGRAPHY N/A 09/05/2019   Procedure: LEFT HEART CATH AND CORONARY ANGIOGRAPHY;  Surgeon: Marykay Lex, MD;  [facility]: [facility] INVASIVE CV LAB;  Service: Cardiovascular;  Laterality: N/A;   LUMBAR FUSION     7 level    MOHS SURGERY     x 11   PARTIAL NEPHRECTOMY     POLYPECTOMY     SPINE SURGERY  2005   7 fused vertebra   TOTAL HIP ARTHROPLASTY Left 03/01/2020   Procedure: LEFT TOTAL HIP ARTHROPLASTY ANTERIOR APPROACH;  Surgeon: Kathryne Hitch, MD;  Location: [facility] ORS;  Service: Orthopedics;  Laterality: Left;   UPPER GASTROINTESTINAL ENDOSCOPY     Family History  Problem Relation Age of Onset   Depression Mother     Hyperlipidemia Mother    Hypertension Mother    Colon polyps Mother    Kidney disease Mother    Obesity Mother    Heart attack Father    Hyperlipidemia Father    Hypertension Father    Colon polyps Father    Alcohol abuse Sister    Depression Sister    Alcohol abuse Son    Drug abuse Son    Alcohol abuse Son    Drug abuse Son    Alcohol abuse Sister    Depression Sister    Drug abuse Sister  Stomach cancer Brother    Cancer Brother    Alcohol abuse Sister    Drug abuse Sister    Early death Sister    Colon cancer Neg Hx    Esophageal cancer Neg Hx    Rectal cancer Neg Hx    Social History   Socioeconomic History   Marital status: Married    Spouse name: Darrell   Number of children: 3   Years of education: 12   Highest education level: Some college, no degree  Occupational History   Occupation: Retired  Tobacco Use   Smoking status: Former    Current packs/day: 0.00    Average packs/day: 1 pack/day for 40.0 years (40.0 ttl pk-yrs)    Types: Cigarettes    Start date: 12/21/1970    Quit date: 12/21/2010    Years since quitting: 13.3   Smokeless tobacco: Never  Vaping Use   Vaping status: Former   Devices: 2012-2014 while quitting cigaretter  Substance and Sexual Activity   Alcohol use: Never    Alcohol/week: 1.0 - 2.0 standard drink of alcohol    Types: 1 - 2 Standard drinks or equivalent per week   Drug use: Yes    Frequency: 3.0 times per week    Types: Hydrocodone   Sexual activity: Not Currently    Birth control/protection: Surgical, None  Other Topics Concern   Not on file  Social History Narrative   Lives with her husband. She has two daughters and one son. She enjoys gardening and reading.    Social Drivers of Corporate investment banker Strain: Low Risk  (04/06/2024)   Overall Financial Resource Strain (CARDIA)    Difficulty of Paying Living Expenses: Not hard at all  Food Insecurity: No Food Insecurity (04/06/2024)   Hunger Vital Sign     Worried About Running Out of Food in the Last Year: Never true    Ran Out of Food in the Last Year: Never true  Transportation Needs: No Transportation Needs (04/06/2024)   PRAPARE - Administrator, Civil Service (Medical): No    Lack of Transportation (Non-Medical): No  Physical Activity: Inactive (04/06/2024)   Exercise Vital Sign    Days of Exercise per Week: 0 days    Minutes of Exercise per Session: 0 min  Stress: No Stress Concern Present (04/06/2024)   [employer] of Occupational Health - Occupational Stress Questionnaire    Feeling of Stress : Not at all  Social Connections: Moderately Isolated (04/06/2024)   Social Connection and Isolation Panel [NHANES]    Frequency of Communication with Friends and Family: More than three times a week    Frequency of Social Gatherings with Friends and Family: Once a week    Attends Religious Services: Never    Database administrator or Organizations: No    Attends Engineer, structural: Never    Marital Status: Married    Tobacco Counseling Counseling given: Not Answered   Clinical Intake:  Pre-visit preparation completed: Yes  Pain : No/denies pain     BMI - recorded: 38.69 Nutritional Status: BMI > 30  Obese Nutritional Risks: None Diabetes: No  How often do you need to have someone help you when you read instructions, pamphlets, or other written materials from your doctor or pharmacy?: 1 - Never What is the last grade level you completed in school?: 13  Interpreter Needed?: No      Activities of Daily Living    04/06/2024  9:59 AM 04/02/2024    8:45 AM  In your present state of health, do you have any difficulty performing the following activities:  Hearing? 0 0  Vision? 0 0  Difficulty concentrating or making decisions? 0 0  Walking or climbing stairs? 1 1  Dressing or bathing? 0 0  Doing errands, shopping? 0 0  Preparing Food and eating ? N N  Using the Toilet? N N  In the past six  months, have you accidently leaked urine? N N  Do you have problems with loss of bowel control? N N  Managing your Medications? N N  Managing your Finances? N N  Housekeeping or managing your Housekeeping? N N    Patient Care Team: Everrett Coombe, DO as PCP - General (Family Medicine) Sheard, Joline Maxcy, DPM (Inactive) as Consulting Physician (Podiatry) Randolm Idol, MD as Consulting Physician (Pulmonary Disease) Armbruster, Willaim Rayas, MD as Consulting Physician (Gastroenterology) Lewayne Bunting, MD as Consulting Physician (Cardiology) Armbruster, Willaim Rayas, MD as Consulting Physician (Gastroenterology) Coralyn Mark, MD (Urology)  Indicate any recent Medical Services you may have received from other than [facility] providers in the past year (date may be approximate).     Assessment:   This is a routine wellness examination for Bonnie [name].  Hearing/Vision screen No results found.   Goals Addressed             This Visit's Progress    Weight (lb) < 200 lb (90.7 kg)   262 lb (118.8 kg)    She would like to continue to lose weight.       Depression Screen    04/06/2024   10:09 AM 12/29/2023   11:07 AM 08/27/2023    8:45 AM 07/07/2023   10:49 AM 06/28/2023   11:10 AM 03/30/2023    9:47 AM 12/22/2022   11:06 AM  PHQ 2/9 Scores  PHQ - 2 Score 0 0 0 0 1 0 0  PHQ- 9 Score  1 1    1     Fall Risk    04/06/2024   10:10 AM 04/02/2024    8:45 AM 12/29/2023   11:07 AM 07/07/2023   10:49 AM 06/28/2023   11:10 AM  Fall Risk   Falls in the past year? 0 0 0 0 0  Number falls in past yr: 0  0 0 0  Injury with Fall? 0  0 0 0  Risk for fall due to : No Fall Risks  No Fall Risks No Fall Risks No Fall Risks  Follow up Falls evaluation completed  Falls evaluation completed Falls evaluation completed Falls evaluation completed    MEDICARE RISK AT HOME: Medicare Risk at Home Any stairs in or around the home?: No Home free of loose throw rugs in walkways, pet beds, electrical cords, etc?:  Yes Adequate lighting in your home to reduce risk of falls?: Yes Life alert?: No Use of a cane, walker or w/c?: No Grab bars in the bathroom?: (Patient-Rptd) No Shower chair or bench in shower?: Yes Elevated toilet seat or a handicapped toilet?: No  TIMED UP AND GO:  Was the test performed?  No    Cognitive Function:        04/06/2024   10:11 AM 03/30/2023    9:54 AM  6CIT Screen  What Year? 0 points 0 points  What month? 0 points 0 points  What time? 0 points 0 points  Count back from 20 0 points 0 points  Months in reverse  0 points 0 points  Repeat phrase 0 points 0 points  Total Score 0 points 0 points    Immunizations Immunization History  Administered Date(s) Administered   Fluad Quad(high Dose 65+) 08/08/2019   Influenza Split 07/23/2020   Influenza, High Dose Seasonal PF 09/06/2023   Influenza,inj,Quad PF,6+ Mos 10/04/2018   Influenza-Unspecified 08/23/2020, 09/23/2021   Moderna Sars-Covid-2 Vaccination 01/16/2020, 02/13/2020, 10/15/2020, 05/01/2021   Pfizer Covid-19 Vaccine Bivalent Booster 64yrs & up 09/23/2021   Pfizer(Comirnaty)Fall Seasonal Vaccine 12 years and older 09/12/2022   Pneumococcal Conjugate-13 05/19/2017   Pneumococcal Polysaccharide-23 10/19/2011, 07/06/2018   Tdap 05/22/2022   Unspecified SARS-COV-2 Vaccination 12/03/2023   Zoster Recombinant(Shingrix) 07/28/2018, 10/06/2018   Zoster, Live 02/18/2014    TDAP status: Up to date  Flu Vaccine status: Up to date  Pneumococcal vaccine status: Up to date  Covid-19 vaccine status: Information provided on how to obtain vaccines.   Qualifies for Shingles Vaccine? Yes   Zostavax completed Yes   Shingrix Completed?: Yes  Screening Tests Health Maintenance  Topic Date Due   Lung Cancer Screening  08/23/2021   Diabetic kidney evaluation - eGFR measurement  12/25/2023   OPHTHALMOLOGY EXAM  03/24/2024   COVID-19 Vaccine (8 - Moderna risk 2024-25 season) 01/13/2025 (Originally 06/02/2024)    FOOT EXAM  06/27/2024   HEMOGLOBIN A1C  06/27/2024   INFLUENZA VACCINE  07/21/2024   Diabetic kidney evaluation - Urine ACR  12/28/2024   Medicare Annual Wellness (AWV)  04/06/2025   MAMMOGRAM  07/25/2025   Colonoscopy  01/22/2028   DTaP/Tdap/Td (2 - Td or Tdap) 05/22/2032   Pneumonia Vaccine 88+ Years old  Completed   DEXA SCAN  Completed   Hepatitis C Screening  Completed   Zoster Vaccines- Shingrix  Completed   HPV VACCINES  Aged Out   Meningococcal B Vaccine  Aged Out    Health Maintenance  Health Maintenance Due  Topic Date Due   Lung Cancer Screening  08/23/2021   Diabetic kidney evaluation - eGFR measurement  12/25/2023   OPHTHALMOLOGY EXAM  03/24/2024    Colorectal cancer screening: Type of screening: Colonoscopy. Completed 01/21/2023. Repeat every 5 years  Mammogram status: Completed 07/26/2023. Repeat every year  Bone Density status: Completed 05/19/2023. Results reflect: Bone density results: NORMAL. Repeat every 2 years.  Lung Cancer Screening: (Low Dose CT Chest recommended if Age 74-80 years, 20 pack-year currently smoking OR have quit w/in 15years.) does qualify.   Lung Cancer Screening Referral: Already in program  Additional Screening:  Hepatitis C Screening: does qualify; Completed 07/06/2018  Vision Screening: Recommended annual ophthalmology exams for early detection of glaucoma and other disorders of the eye. Is the patient up to date with their annual eye exam?  Yes  Who is the provider or what is the name of the office in which the patient attends annual eye exams? [facility] in [place] Dr. Raquel Cables If pt is not established with a provider, would they like to be referred to a provider to establish care? No .   Dental Screening: Recommended annual dental exams for proper oral hygiene  Diabetic Foot Exam: Diabetic Foot Exam: Completed 06/28/2023  Community Resource Referral / Chronic Care Management: CRR required this visit?  No   CCM required this  visit?  No     Plan:     I have personally reviewed and noted the following in the patient's chart:   Medical and social history Use of alcohol, tobacco or illicit drugs  Current medications and supplements including opioid  prescriptions. Patient is currently taking opioid prescriptions. Information provided to patient regarding non-opioid alternatives. Patient advised to discuss non-opioid treatment plan with their provider. Functional ability and status Nutritional status Physical activity Advanced directives List of other physicians Hospitalizations, surgeries, and ER visits in previous 12 months. ER #1 Vitals Screenings to include cognitive, depression, and falls Referrals and appointments  In addition, I have reviewed and discussed with patient certain preventive protocols, quality metrics, and best practice recommendations. A written personalized care plan for preventive services as well as general preventive health recommendations were provided to patient.     Aubrey Leaf, CMA   04/06/2024   After Visit Summary: (MyChart) Due to this being a telephonic visit, the after visit summary with patients personalized plan was offered to patient via MyChart   Nurse Notes:    Bonnie [name] is a 73 y.o. female patient of Adela Holter, DO who had a Medicare Annual Wellness Visit today via telephone. Bonnie [name] is Retired and lives with their spouse. she has 3 children. she reports that she is socially active and does interact with friends/family regularly. she is minimally physically active and enjoys gardening and reading. Aaron Aas

## 2024-04-06 NOTE — Patient Instructions (Signed)
  Ms. Winterrowd , Thank you for taking time to come for your Medicare Wellness Visit. I appreciate your ongoing commitment to your health goals. Please review the following plan we discussed and let me know if I can assist you in the future.   These are the goals we discussed:  Goals       Patient Stated (pt-stated)      Patient stated that she would like to continue to work on her weight.      Weight (lb) < 200 lb (90.7 kg)      She would like to continue to lose weight.         This is a list of the screening recommended for you and due dates:  Health Maintenance  Topic Date Due   Screening for Lung Cancer  08/23/2021   Yearly kidney function blood test for diabetes  12/25/2023   Eye exam for diabetics  03/24/2024   COVID-19 Vaccine (8 - Moderna risk 2024-25 season) 01/13/2025*   Complete foot exam   06/27/2024   Hemoglobin A1C  06/27/2024   Flu Shot  07/21/2024   Yearly kidney health urinalysis for diabetes  12/28/2024   Medicare Annual Wellness Visit  04/06/2025   Mammogram  07/25/2025   Colon Cancer Screening  01/22/2028   DTaP/Tdap/Td vaccine (2 - Td or Tdap) 05/22/2032   Pneumonia Vaccine  Completed   DEXA scan (bone density measurement)  Completed   Hepatitis C Screening  Completed   Zoster (Shingles) Vaccine  Completed   HPV Vaccine  Aged Out   Meningitis B Vaccine  Aged Out  *Topic was postponed. The date shown is not the original due date.

## 2024-04-18 ENCOUNTER — Other Ambulatory Visit: Payer: Self-pay | Admitting: Cardiology

## 2024-04-18 DIAGNOSIS — I2510 Atherosclerotic heart disease of native coronary artery without angina pectoris: Secondary | ICD-10-CM

## 2024-04-18 DIAGNOSIS — E7800 Pure hypercholesterolemia, unspecified: Secondary | ICD-10-CM

## 2024-04-19 ENCOUNTER — Encounter: Payer: Self-pay | Admitting: Cardiology

## 2024-04-19 DIAGNOSIS — I2510 Atherosclerotic heart disease of native coronary artery without angina pectoris: Secondary | ICD-10-CM

## 2024-04-19 DIAGNOSIS — E7800 Pure hypercholesterolemia, unspecified: Secondary | ICD-10-CM

## 2024-04-19 MED ORDER — EZETIMIBE 10 MG PO TABS: 10.0000 mg | ORAL_TABLET | Freq: Every day | ORAL | 0 refills | Status: DC

## 2024-04-24 ENCOUNTER — Encounter: Payer: Self-pay | Admitting: Family Medicine

## 2024-04-24 DIAGNOSIS — F419 Anxiety disorder, unspecified: Secondary | ICD-10-CM

## 2024-04-24 NOTE — Telephone Encounter (Signed)
 Requesting rx rf of alprzaolam 0.25mg  Last written 10/07/2023 Last OV 12/29/2023 Upcoming appt 04/12/2024 AWV

## 2024-04-26 MED ORDER — ALPRAZOLAM 0.25 MG PO TABS: 0.2500 mg | ORAL_TABLET | Freq: Two times a day (BID) | ORAL | 3 refills | Status: DC | PRN

## 2024-05-03 ENCOUNTER — Ambulatory Visit: Admitting: Sports Medicine

## 2024-05-03 ENCOUNTER — Other Ambulatory Visit: Payer: Self-pay | Admitting: Family Medicine

## 2024-05-03 DIAGNOSIS — M47816 Spondylosis without myelopathy or radiculopathy, lumbar region: Secondary | ICD-10-CM

## 2024-05-04 MED ORDER — HYDROCODONE-ACETAMINOPHEN 5-325 MG PO TABS: 1.0000 | ORAL_TABLET | Freq: Three times a day (TID) | ORAL | 0 refills | Status: DC | PRN

## 2024-05-12 ENCOUNTER — Ambulatory Visit (INDEPENDENT_AMBULATORY_CARE_PROVIDER_SITE_OTHER): Admitting: Sports Medicine

## 2024-05-12 ENCOUNTER — Other Ambulatory Visit (INDEPENDENT_AMBULATORY_CARE_PROVIDER_SITE_OTHER)

## 2024-05-12 VITALS — Wt 262.0 lb

## 2024-05-12 DIAGNOSIS — M170 Bilateral primary osteoarthritis of knee: Secondary | ICD-10-CM

## 2024-05-12 MED ORDER — TRIAMCINOLONE ACETONIDE 40 MG/ML IJ SUSP
80.0000 mg | Freq: Once | INTRAMUSCULAR | Status: AC
Start: 2024-05-12 — End: 2024-05-12
  Administered 2024-05-12: 80 mg via INTRAMUSCULAR

## 2024-05-12 NOTE — Addendum Note (Signed)
 Addended by: OLIVA-AVELLANEDA, Nashton Belson L on: 05/12/2024 10:50 AM   Modules accepted: Orders

## 2024-05-12 NOTE — Assessment & Plan Note (Signed)
 Did not respond to Orthovisc last month, today we did bilateral steroid injections, return as needed. Geniculate artery embolization remains an option.

## 2024-05-12 NOTE — Progress Notes (Signed)
    Procedures performed today:    Procedure: Real-time Ultrasound Guided injection of the left knee Device: Samsung HS60  Verbal informed consent obtained.  Time-out conducted.  Noted no overlying erythema, induration, or other signs of local infection.  Skin prepped in a sterile fashion.  Local anesthesia: Topical Ethyl chloride.  With sterile technique and under real time ultrasound guidance: 1 cc Kenalog  40, 2 cc lidocaine , 2 cc bupivacaine injected easily Completed without difficulty  Advised to call if fevers/chills, erythema, induration, drainage, or persistent bleeding.  Images permanently stored and available for review in PACS.  Impression: Technically successful ultrasound guided injection.  Procedure: Real-time Ultrasound Guided injection of the right knee Device: Samsung HS60  Verbal informed consent obtained.  Time-out conducted.  Noted no overlying erythema, induration, or other signs of local infection.  Skin prepped in a sterile fashion.  Local anesthesia: Topical Ethyl chloride.  With sterile technique and under real time ultrasound guidance: 1 cc Kenalog  40, 2 cc lidocaine , 2 cc bupivacaine injected easily Completed without difficulty  Advised to call if fevers/chills, erythema, induration, drainage, or persistent bleeding.  Images permanently stored and available for review in PACS.  Impression: Technically successful ultrasound guided injection.  Independent interpretation of notes and tests performed by another provider:   None.  Brief History, Exam, Impression, and Recommendations:    Primary osteoarthritis of both knees Did not respond to Orthovisc last month, today we did bilateral steroid injections, return as needed. Geniculate artery embolization remains an option.    ____________________________________________ Joselyn Nicely. Sandy Crumb, M.D., ABFM., CAQSM., AME. Primary Care and Sports Medicine [facility]  Adjunct  Professor of [facility] Medicine  [employer]  Restaurant manager, fast food

## 2024-06-07 ENCOUNTER — Other Ambulatory Visit: Payer: Self-pay | Admitting: Family Medicine

## 2024-06-12 ENCOUNTER — Other Ambulatory Visit: Payer: Self-pay | Admitting: Family Medicine

## 2024-06-12 DIAGNOSIS — Z1231 Encounter for screening mammogram for malignant neoplasm of breast: Secondary | ICD-10-CM

## 2024-07-05 DIAGNOSIS — I129 Hypertensive chronic kidney disease with stage 1 through stage 4 chronic kidney disease, or unspecified chronic kidney disease: Secondary | ICD-10-CM | POA: Insufficient documentation

## 2024-07-11 ENCOUNTER — Encounter: Payer: Self-pay | Admitting: Family Medicine

## 2024-07-11 ENCOUNTER — Ambulatory Visit (INDEPENDENT_AMBULATORY_CARE_PROVIDER_SITE_OTHER): Admitting: Family Medicine

## 2024-07-11 VITALS — BP 91/61 | HR 81 | Ht 69.0 in | Wt 250.0 lb

## 2024-07-11 DIAGNOSIS — I10 Essential (primary) hypertension: Secondary | ICD-10-CM | POA: Diagnosis not present

## 2024-07-11 DIAGNOSIS — E1169 Type 2 diabetes mellitus with other specified complication: Secondary | ICD-10-CM | POA: Diagnosis not present

## 2024-07-11 DIAGNOSIS — E785 Hyperlipidemia, unspecified: Secondary | ICD-10-CM

## 2024-07-11 DIAGNOSIS — F3342 Major depressive disorder, recurrent, in full remission: Secondary | ICD-10-CM | POA: Diagnosis not present

## 2024-07-11 DIAGNOSIS — E1165 Type 2 diabetes mellitus with hyperglycemia: Secondary | ICD-10-CM

## 2024-07-11 DIAGNOSIS — Z7984 Long term (current) use of oral hypoglycemic drugs: Secondary | ICD-10-CM

## 2024-07-11 DIAGNOSIS — E7800 Pure hypercholesterolemia, unspecified: Secondary | ICD-10-CM

## 2024-07-11 DIAGNOSIS — I2510 Atherosclerotic heart disease of native coronary artery without angina pectoris: Secondary | ICD-10-CM

## 2024-07-11 DIAGNOSIS — Z7985 Long-term (current) use of injectable non-insulin antidiabetic drugs: Secondary | ICD-10-CM

## 2024-07-11 DIAGNOSIS — F331 Major depressive disorder, recurrent, moderate: Secondary | ICD-10-CM

## 2024-07-11 LAB — POCT GLYCOSYLATED HEMOGLOBIN (HGB A1C): HbA1c, POC (controlled diabetic range): 5.3 % (ref 0.0–7.0)

## 2024-07-11 MED ORDER — MOUNJARO 10 MG/0.5ML ~~LOC~~ SOAJ: 10.0000 mg | SUBCUTANEOUS | 1 refills | Status: AC

## 2024-07-11 MED ORDER — TRIAMTERENE-HCTZ 37.5-25 MG PO TABS: 1.0000 | ORAL_TABLET | Freq: Every day | ORAL | 3 refills | Status: DC

## 2024-07-11 MED ORDER — ENALAPRIL MALEATE 5 MG PO TABS: 5.0000 mg | ORAL_TABLET | Freq: Every day | ORAL | 1 refills | Status: DC

## 2024-07-11 MED ORDER — ATORVASTATIN CALCIUM 80 MG PO TABS: 80.0000 mg | ORAL_TABLET | Freq: Every day | ORAL | 1 refills | Status: AC

## 2024-07-11 MED ORDER — TRAZODONE HCL 50 MG PO TABS
50.0000 mg | ORAL_TABLET | Freq: Every day | ORAL | 2 refills | Status: DC
Start: 2024-07-11 — End: 2024-08-28

## 2024-07-11 MED ORDER — VENLAFAXINE HCL ER 37.5 MG PO CP24
37.5000 mg | ORAL_CAPSULE | Freq: Every day | ORAL | 1 refills | Status: DC
Start: 2024-07-11 — End: 2024-09-27

## 2024-07-11 MED ORDER — BUPROPION HCL ER (XL) 150 MG PO TB24: 150.0000 mg | ORAL_TABLET | Freq: Every morning | ORAL | 1 refills | Status: AC

## 2024-07-11 NOTE — Progress Notes (Signed)
 Bonnie [name] - 73 y.o. female MRN [id number]  Date of birth: [birth year]  Subjective Chief Complaint  Patient presents with   Diabetes   Hypertension   Weight Loss    HPI Bonnie [name] is a 73 y.o. female here today for follow up visit.    She reports that she is doing pretty well.  She continues on mounjaro  10mg /week for management of diabetes.  Also on Farxiga .  A1c today is 5.3%.  Weight is down about 20lbs since last visit.   Mild hypotension today in clinic.  As above, she has lost a significant amount of weight.  She denies symptoms related to hypotension.  She ha snot had chest pain, shortness of breath, palpitations, headache or vision changes.     Mood remains stable with Effexor -XR and bupropion .  Occasional alprazolam  use.   Recently spent a month in [place] .  She would like to reduce medication some.   ROS:  A comprehensive ROS was completed and negative except as noted per HPI  Allergies  Allergen Reactions   Latex Itching and Rash   Metformin  And Related Other (See Comments)    Malaise, fatigue   Morphine And Codeine Rash and Other (See Comments)    Agitation   Tape Swelling and Other (See Comments)    Burning sensation   Tramadol Palpitations    Heart fluttering    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   Chronic low back pain    Colon polyps    Depression    Diabetes mellitus without complication (HCC)    Fatty liver    Hypertension    Melanoma (HCC)    R upper extremity   Melanoma in situ of right upper extremity (HCC)    Mild diastolic dysfunction 07/09/2019   Osteoarthritis of lumbar spine    Renal cancer (HCC)    Skin cancer    Sleep apnea 2008   cpap nightly    Past Surgical History:   Procedure Laterality Date   ABDOMINAL HYSTERECTOMY     ANKLE SURGERY Left    in her 30s   BILE DUCT STENT PLACEMENT     CARDIAC CATHETERIZATION     CHOLECYSTECTOMY     COLONOSCOPY     FRACTURE SURGERY  2005   Back, Ankle in 1980's   JOINT REPLACEMENT  2021   Left Hip   LEFT HEART CATH AND CORONARY ANGIOGRAPHY N/A 09/05/2019   Procedure: LEFT HEART CATH AND CORONARY ANGIOGRAPHY;  Surgeon: Anner Alm ORN, MD;  Location: [facility] INVASIVE CV LAB;  Service: Cardiovascular;  Laterality: N/A;   LUMBAR FUSION     7 level    MOHS SURGERY     x 11   PARTIAL NEPHRECTOMY     POLYPECTOMY     SPINE SURGERY  2005   7 fused vertebra   TOTAL HIP ARTHROPLASTY Left 03/01/2020   Procedure: LEFT TOTAL HIP ARTHROPLASTY ANTERIOR APPROACH;  Surgeon: Vernetta Lonni GRADE, MD;  Location: [facility];  Service: Orthopedics;  Laterality: Left;   UPPER GASTROINTESTINAL ENDOSCOPY      Social History   Socioeconomic History   Marital status: Married    Spouse name: Darrell   Number  of children: 3   Years of education: 12   Highest education level: Some college, no degree  Occupational History   Occupation: Retired  Tobacco Use   Smoking status: Former    Current packs/day: 0.00    Average packs/day: 1 pack/day for 40.0 years (40.0 ttl pk-yrs)    Types: Cigarettes    Start date: 12/21/1970    Quit date: 12/21/2010    Years since quitting: 13.5   Smokeless tobacco: Never  Vaping Use   Vaping status: Former   Devices: 2012-2014 while quitting cigaretter  Substance and Sexual Activity   Alcohol use: Never    Alcohol/week: 1.0 - 2.0 standard drink of alcohol    Types: 1 - 2 Standard drinks or equivalent per week   Drug use: Yes    Frequency: 3.0 times per week    Types: Hydrocodone    Sexual activity: Not Currently    Birth control/protection: Surgical, None  Other Topics Concern   Not on file  Social History Narrative   Lives with her husband. She has two daughters and one son. She enjoys gardening  and reading.    Social Drivers of Corporate investment banker Strain: Low Risk  (07/04/2024)   Overall Financial Resource Strain (CARDIA)    Difficulty of Paying Living Expenses: Not hard at all  Food Insecurity: No Food Insecurity (07/04/2024)   Hunger Vital Sign    Worried About Running Out of Food in the Last Year: Never true    Ran Out of Food in the Last Year: Never true  Transportation Needs: No Transportation Needs (07/04/2024)   PRAPARE - Administrator, Civil Service (Medical): No    Lack of Transportation (Non-Medical): No  Physical Activity: Insufficiently Active (07/04/2024)   Exercise Vital Sign    Days of Exercise per Week: 2 days    Minutes of Exercise per Session: 60 min  Stress: No Stress Concern Present (07/04/2024)   [employer] of Occupational Health - Occupational Stress Questionnaire    Feeling of Stress: Only a little  Social Connections: Moderately Isolated (07/04/2024)   Social Connection and Isolation Panel    Frequency of Communication with Friends and Family: Twice a week    Frequency of Social Gatherings with Friends and Family: Once a week    Attends Religious Services: Never    Database administrator or Organizations: No    Attends Engineer, structural: Not on file    Marital Status: Married    Family History  Problem Relation Age of Onset   Depression Mother    Hyperlipidemia Mother    Hypertension Mother    Colon polyps Mother    Kidney disease Mother    Obesity Mother    Heart attack Father    Hyperlipidemia Father    Hypertension Father    Colon polyps Father    Alcohol abuse Sister    Depression Sister    Alcohol abuse Son    Drug abuse Son    Alcohol abuse Son    Drug abuse Son    Alcohol abuse Sister    Depression Sister    Drug abuse Sister    Stomach cancer Brother    Cancer Brother    Alcohol abuse Sister    Drug abuse Sister    Early death Sister    Colon cancer Neg Hx    Esophageal cancer Neg Hx     Rectal cancer Neg Hx     Health  Maintenance  Topic Date Due   Lung Cancer Screening  08/23/2021   Diabetic kidney evaluation - eGFR measurement  12/25/2023   OPHTHALMOLOGY EXAM  03/24/2024   COVID-19 Vaccine (8 - Moderna risk 2024-25 season) 01/13/2025 (Originally 06/02/2024)   INFLUENZA VACCINE  07/21/2024   Diabetic kidney evaluation - Urine ACR  12/28/2024   HEMOGLOBIN A1C  01/11/2025   Medicare Annual Wellness (AWV)  04/06/2025   FOOT EXAM  07/11/2025   MAMMOGRAM  07/25/2025   Colonoscopy  01/22/2028   DTaP/Tdap/Td (2 - Td or Tdap) 05/22/2032   Pneumococcal Vaccine: 50+ Years  Completed   DEXA SCAN  Completed   Hepatitis C Screening  Completed   Zoster Vaccines- Shingrix   Completed   Hepatitis B Vaccines  Aged Out   HPV VACCINES  Aged Out   Meningococcal B Vaccine  Aged Out     ----------------------------------------------------------------------------------------------------------------------------------------------------------------------------------------------------------------- Physical Exam BP 91/61 (BP Location: Left Arm, Patient Position: Sitting, Cuff Size: Large)   Pulse 81   Ht 5' 9 (1.753 m)   Wt 250 lb (113.4 kg)   SpO2 95%   BMI 36.92 kg/m   Physical Exam Constitutional:      Appearance: Normal appearance.  HENT:     Head: Normocephalic and atraumatic.  Cardiovascular:     Rate and Rhythm: Normal rate and regular rhythm.  Pulmonary:     Effort: Pulmonary effort is normal.     Breath sounds: Normal breath sounds.  Musculoskeletal:     Cervical back: Neck supple.  Neurological:     General: No focal deficit present.     Mental Status: She is alert.  Psychiatric:        Mood and Affect: Mood normal.        Behavior: Behavior normal.      ------------------------------------------------------------------------------------------------------------------------------------------------------------------------------------------------------------------- Assessment and Plan  Depression, major Feels like depression is doing better.  Will decrease Effexor  to 37.5 mg.  Continue bupropion  at current strength.  Essential hypertension Blood pressure little low today.  Decrease enalapril  to 5 mg.  She will continue to monitor blood pressure at home.  Hyperlipidemia Continue atorvastatin  at current strength.  Updated labs ordered.  Type 2 diabetes mellitus with hyperglycemia, without long-term current use of insulin (HCC) Blood sugars are well-controlled.  She is doing well with Mounjaro  at current strength.  Will plan to continue this..  Dyslipidemia associated with type 2 diabetes mellitus (HCC) Doing well with combination of atorvastatin  and Zetia .  Recommend continuation.   Meds ordered this encounter  Medications   atorvastatin  (LIPITOR) 80 MG tablet    Sig: Take 1 tablet (80 mg total) by mouth daily.    Dispense:  90 tablet    Refill:  1   buPROPion  (WELLBUTRIN  XL) 150 MG 24 hr tablet    Sig: Take 1 tablet (150 mg total) by mouth every morning.    Dispense:  90 tablet    Refill:  1   enalapril  (VASOTEC ) 5 MG tablet    Sig: Take 1 tablet (5 mg total) by mouth daily.    Dispense:  90 tablet    Refill:  1   tirzepatide  (MOUNJARO ) 10 MG/0.5ML Pen    Sig: Inject 10 mg into the skin once a week.    Dispense:  6 mL    Refill:  1   traZODone  (DESYREL ) 50 MG tablet    Sig: Take 1 tablet (50 mg total) by mouth at bedtime.    Dispense:  90 tablet    Refill:  2   triamterene -hydrochlorothiazide  (MAXZIDE -25) 37.5-25 MG tablet    Sig: Take 1 tablet by mouth daily.    Dispense:  90 tablet    Refill:  3   venlafaxine  XR (EFFEXOR -XR) 37.5 MG 24 hr capsule    Sig: Take 1 capsule (37.5 mg total) by mouth daily.    Dispense:   90 capsule    Refill:  1    Return in about 6 months (around 01/11/2025) for Type 2 Diabetes, Hypertension, Mood/BH.

## 2024-07-11 NOTE — Assessment & Plan Note (Addendum)
 Blood pressure little low today.  Decrease enalapril  to 5 mg.  She will continue to monitor blood pressure at home.

## 2024-07-11 NOTE — Assessment & Plan Note (Signed)
 Feels like depression is doing better.  Will decrease Effexor  to 37.5 mg.  Continue bupropion  at current strength.

## 2024-07-11 NOTE — Patient Instructions (Signed)
 Reduce enalapril  to 5mg . Reduce effexor  to 37.5mg  daily.  See me again in 6 months.

## 2024-07-11 NOTE — Assessment & Plan Note (Signed)
 Blood sugars are well-controlled.  She is doing well with Mounjaro at current strength.  Will plan to continue this.Marland Kitchen

## 2024-07-11 NOTE — Assessment & Plan Note (Signed)
Continue atorvastatin at current strength.  Updated labs ordered.   ?

## 2024-07-11 NOTE — Assessment & Plan Note (Signed)
Doing well with combination of atorvastatin and Zetia.  Recommend continuation.

## 2024-07-12 LAB — CBC WITH DIFFERENTIAL/PLATELET
Basophils Absolute: 0.1 x10E3/uL (ref 0.0–0.2)
Basos: 1 %
EOS (ABSOLUTE): 0.2 x10E3/uL (ref 0.0–0.4)
Eos: 4 %
Hematocrit: 39.3 % (ref 34.0–46.6)
Hemoglobin: 12.5 g/dL (ref 11.1–15.9)
Immature Grans (Abs): 0 x10E3/uL (ref 0.0–0.1)
Immature Granulocytes: 0 %
Lymphocytes Absolute: 1.6 x10E3/uL (ref 0.7–3.1)
Lymphs: 24 %
MCH: 30.2 pg (ref 26.6–33.0)
MCHC: 31.8 g/dL (ref 31.5–35.7)
MCV: 95 fL (ref 79–97)
Monocytes Absolute: 0.5 x10E3/uL (ref 0.1–0.9)
Monocytes: 7 %
Neutrophils Absolute: 4.3 x10E3/uL (ref 1.4–7.0)
Neutrophils: 64 %
Platelets: 299 x10E3/uL (ref 150–450)
RBC: 4.14 x10E6/uL (ref 3.77–5.28)
RDW: 12.6 % (ref 11.7–15.4)
WBC: 6.7 x10E3/uL (ref 3.4–10.8)

## 2024-07-12 LAB — CMP14+EGFR
ALT: 15 IU/L (ref 0–32)
AST: 14 IU/L (ref 0–40)
Albumin: 4.2 g/dL (ref 3.8–4.8)
Alkaline Phosphatase: 85 IU/L (ref 44–121)
BUN/Creatinine Ratio: 17 (ref 12–28)
BUN: 32 mg/dL — ABNORMAL HIGH (ref 8–27)
Bilirubin Total: 0.5 mg/dL (ref 0.0–1.2)
CO2: 21 mmol/L (ref 20–29)
Calcium: 9.7 mg/dL (ref 8.7–10.3)
Chloride: 102 mmol/L (ref 96–106)
Creatinine, Ser: 1.83 mg/dL — ABNORMAL HIGH (ref 0.57–1.00)
Globulin, Total: 2.3 g/dL (ref 1.5–4.5)
Glucose: 93 mg/dL (ref 70–99)
Potassium: 4.5 mmol/L (ref 3.5–5.2)
Sodium: 141 mmol/L (ref 134–144)
Total Protein: 6.5 g/dL (ref 6.0–8.5)
eGFR: 29 mL/min/1.73 — ABNORMAL LOW (ref >59)

## 2024-07-12 LAB — LIPID PANEL
Chol/HDL Ratio: 2.4 ratio (ref 0.0–4.4)
Cholesterol, Total: 132 mg/dL (ref 100–199)
HDL: 54 mg/dL (ref >39)
LDL Chol Calc (NIH): 61 mg/dL (ref 0–99)
Triglycerides: 87 mg/dL (ref 0–149)
VLDL Cholesterol Cal: 17 mg/dL (ref 5–40)

## 2024-07-13 ENCOUNTER — Other Ambulatory Visit: Payer: Self-pay | Admitting: Family Medicine

## 2024-07-13 ENCOUNTER — Encounter: Payer: Self-pay | Admitting: Family Medicine

## 2024-07-13 DIAGNOSIS — R7989 Other specified abnormal findings of blood chemistry: Secondary | ICD-10-CM

## 2024-07-16 ENCOUNTER — Other Ambulatory Visit: Payer: Self-pay | Admitting: Cardiology

## 2024-07-16 ENCOUNTER — Other Ambulatory Visit: Payer: Self-pay | Admitting: Family Medicine

## 2024-07-16 DIAGNOSIS — E7800 Pure hypercholesterolemia, unspecified: Secondary | ICD-10-CM

## 2024-07-16 DIAGNOSIS — E1165 Type 2 diabetes mellitus with hyperglycemia: Secondary | ICD-10-CM

## 2024-07-16 DIAGNOSIS — I2510 Atherosclerotic heart disease of native coronary artery without angina pectoris: Secondary | ICD-10-CM

## 2024-07-16 DIAGNOSIS — E1169 Type 2 diabetes mellitus with other specified complication: Secondary | ICD-10-CM

## 2024-07-20 ENCOUNTER — Ambulatory Visit: Payer: Self-pay | Admitting: Family Medicine

## 2024-07-20 NOTE — Progress Notes (Signed)
 Hi Bonnie [name], I am covering for Dr. Alvia while he is out of the office.  Your kidney function jumped up compared to your usual baseline.  It was 1.8 this time and normally around 1.1-1.4.  But you also look very dry on your blood work.  I am not sure if something has changed recently.  I see that you do take a diuretic and you also take the Farxiga .  I would really like for you to hydrate well and come back in and have that repeated early next week.  Liver function and blood count are normal no sign of anemia.  Cholesterol looks great!  Please order BMP, urine microalbumin for her to come back in next week.

## 2024-07-25 LAB — BASIC METABOLIC PANEL WITH GFR
BUN/Creatinine Ratio: 13 (ref 12–28)
BUN: 24 mg/dL (ref 8–27)
CO2: 20 mmol/L (ref 20–29)
Calcium: 9.4 mg/dL (ref 8.7–10.3)
Chloride: 101 mmol/L (ref 96–106)
Creatinine, Ser: 1.79 mg/dL — ABNORMAL HIGH (ref 0.57–1.00)
Glucose: 92 mg/dL (ref 70–99)
Potassium: 4.2 mmol/L (ref 3.5–5.2)
Sodium: 139 mmol/L (ref 134–144)
eGFR: 30 mL/min/1.73 — ABNORMAL LOW (ref >59)

## 2024-07-26 ENCOUNTER — Ambulatory Visit: Admitting: Family Medicine

## 2024-07-26 ENCOUNTER — Ambulatory Visit
Admission: RE | Admit: 2024-07-26 | Discharge: 2024-07-26 | Disposition: A | Discharge status: Home / Self Care | Source: Ambulatory Visit | Attending: Family Medicine | Admitting: Family Medicine

## 2024-07-26 ENCOUNTER — Ambulatory Visit: Payer: Self-pay | Admitting: Family Medicine

## 2024-07-26 DIAGNOSIS — Z1231 Encounter for screening mammogram for malignant neoplasm of breast: Secondary | ICD-10-CM

## 2024-07-26 DIAGNOSIS — N1832 Chronic kidney disease, stage 3b: Secondary | ICD-10-CM

## 2024-07-31 ENCOUNTER — Other Ambulatory Visit: Payer: Self-pay | Admitting: Family Medicine

## 2024-07-31 NOTE — Telephone Encounter (Signed)
 Forwarding message to Zada Palin, NP covering Dr. Alvia.  Pended referral

## 2024-07-31 NOTE — Addendum Note (Signed)
 Addended by: Hesham Womac P on: 07/31/2024 10:59 AM   Modules accepted: Orders

## 2024-08-01 ENCOUNTER — Other Ambulatory Visit: Payer: Self-pay | Admitting: Family Medicine

## 2024-08-01 DIAGNOSIS — E1165 Type 2 diabetes mellitus with hyperglycemia: Secondary | ICD-10-CM

## 2024-08-01 DIAGNOSIS — E1169 Type 2 diabetes mellitus with other specified complication: Secondary | ICD-10-CM

## 2024-08-01 NOTE — Telephone Encounter (Signed)
 Copied from CRM #[id number]. Topic: Clinical - Medication Refill >> Aug 01, 2024  2:17 PM Kevelyn M wrote: Medication: atorvastatin  (LIPITOR) 80 MG tablet  Has the patient contacted their pharmacy? Yes (Agent: If no, request that the patient contact the pharmacy for the refill. If patient does not wish to contact the pharmacy document the reason why and proceed with request.) (Agent: If yes, when and what did the pharmacy advise?)  This is the patient's preferred pharmacy:   [facility] - [place], [place] - [facility] AT Portal to [facility] [address] [place] [address] Phone: [phone] Fax: [phone]  Is this the correct pharmacy for this prescription? Yes If no, delete pharmacy and type the correct one.   Has the prescription been filled recently? No  Is the patient out of the medication? No  Has the patient been seen for an appointment in the last year OR does the patient have an upcoming appointment? Yes  Can we respond through MyChart? No  Agent: Please be advised that Rx refills may take up to 3 business days. We ask that you follow-up with your pharmacy.

## 2024-08-09 ENCOUNTER — Ambulatory Visit: Admitting: Family Medicine

## 2024-08-12 ENCOUNTER — Other Ambulatory Visit: Payer: Self-pay | Admitting: Family Medicine

## 2024-08-12 DIAGNOSIS — I10 Essential (primary) hypertension: Secondary | ICD-10-CM

## 2024-08-22 ENCOUNTER — Encounter: Payer: Self-pay | Admitting: Sports Medicine

## 2024-08-27 ENCOUNTER — Other Ambulatory Visit: Payer: Self-pay | Admitting: Family Medicine

## 2024-08-27 DIAGNOSIS — F3342 Major depressive disorder, recurrent, in full remission: Secondary | ICD-10-CM

## 2024-09-06 DIAGNOSIS — H02831 Dermatochalasis of right upper eyelid: Secondary | ICD-10-CM | POA: Insufficient documentation

## 2024-09-06 DIAGNOSIS — H02059 Trichiasis without entropian unspecified eye, unspecified eyelid: Secondary | ICD-10-CM | POA: Insufficient documentation

## 2024-09-06 DIAGNOSIS — C44111 Basal cell carcinoma of skin of unspecified eyelid, including canthus: Secondary | ICD-10-CM | POA: Insufficient documentation

## 2024-09-27 ENCOUNTER — Ambulatory Visit (INDEPENDENT_AMBULATORY_CARE_PROVIDER_SITE_OTHER): Admitting: Family Medicine

## 2024-09-27 ENCOUNTER — Encounter: Payer: Self-pay | Admitting: Family Medicine

## 2024-09-27 VITALS — BP 94/66 | HR 88 | Ht 69.0 in | Wt 241.0 lb

## 2024-09-27 DIAGNOSIS — E1165 Type 2 diabetes mellitus with hyperglycemia: Secondary | ICD-10-CM

## 2024-09-27 DIAGNOSIS — Z7985 Long-term (current) use of injectable non-insulin antidiabetic drugs: Secondary | ICD-10-CM

## 2024-09-27 DIAGNOSIS — R5383 Other fatigue: Secondary | ICD-10-CM | POA: Insufficient documentation

## 2024-09-27 DIAGNOSIS — R634 Abnormal weight loss: Secondary | ICD-10-CM

## 2024-09-27 DIAGNOSIS — I10 Essential (primary) hypertension: Secondary | ICD-10-CM

## 2024-09-27 DIAGNOSIS — E1122 Type 2 diabetes mellitus with diabetic chronic kidney disease: Secondary | ICD-10-CM | POA: Diagnosis not present

## 2024-09-27 DIAGNOSIS — N1831 Chronic kidney disease, stage 3a: Secondary | ICD-10-CM | POA: Diagnosis not present

## 2024-09-27 NOTE — Assessment & Plan Note (Signed)
 Orders Placed This Encounter  Procedures   CMP14+EGFR   CBC with Differential/Platelet   TSH   Ferritin   HgB A1c   B12   Vitamin D  (25 hydroxy)

## 2024-09-27 NOTE — Progress Notes (Signed)
 BRITTONY BILLICK - 73 y.o. female MRN [id number]  Date of birth: 09/10/1951  Subjective Chief Complaint  Patient presents with   Chronic Kidney Disease    HPI Bonnie [name] is a 73 year old female here today for follow-up visit.  She reports having increased fatigue as well as some occasional nausea.  She is concerned about her renal function.  She does not have an appointment with nephrology until next month.  Has had good weight loss with tirzepatide  but admits that she does not have much of an appetite.  She has had some increase stress as well related to unknown status of lung nodule that her husband has as well as her ex-husband and father of her children being ill.  ROS:  A comprehensive ROS was completed and negative except as noted per HPI  Allergies  Allergen Reactions   Latex Itching and Rash   Metformin  And Related Other (See Comments)    Malaise, fatigue   Morphine And Codeine Rash and Other (See Comments)    Agitation   Tape Swelling and Other (See Comments)    Burning sensation   Tramadol Palpitations    Heart fluttering    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   Chronic low back pain    Colon polyps    Depression    Diabetes mellitus without complication (HCC)    Fatty liver    Hypertension    Melanoma (HCC)    R upper extremity   Melanoma in situ of right upper extremity (HCC)    Mild diastolic dysfunction 07/09/2019   Osteoarthritis of lumbar spine    Renal cancer (HCC)    Skin cancer    Sleep apnea 2008   cpap nightly    Past Surgical History:  Procedure Laterality Date   ABDOMINAL HYSTERECTOMY     ANKLE SURGERY Left    in her 30s   BILE DUCT STENT PLACEMENT     CARDIAC CATHETERIZATION      CHOLECYSTECTOMY     COLONOSCOPY     FRACTURE SURGERY  2005   Back, Ankle in 1980's   JOINT REPLACEMENT  2021   Left Hip   LEFT HEART CATH AND CORONARY ANGIOGRAPHY N/A 09/05/2019   Procedure: LEFT HEART CATH AND CORONARY ANGIOGRAPHY;  Surgeon: Anner Alm ORN, MD;  Location: [facility] INVASIVE CV LAB;  Service: Cardiovascular;  Laterality: N/A;   LUMBAR FUSION     7 level    MOHS SURGERY     x 11   PARTIAL NEPHRECTOMY     POLYPECTOMY     SPINE SURGERY  2005   7 fused vertebra   TOTAL HIP ARTHROPLASTY Left 03/01/2020   Procedure: LEFT TOTAL HIP ARTHROPLASTY ANTERIOR APPROACH;  Surgeon: Vernetta Lonni GRADE, MD;  Location: [facility];  Service: Orthopedics;  Laterality: Left;   UPPER GASTROINTESTINAL ENDOSCOPY      Social History   Socioeconomic History   Marital status: Married    Spouse name: Darrell   Number of children: 3   Years of education: 12   Highest education level: Some college, no degree  Occupational History   Occupation: Retired  Tobacco Use   Smoking status: Former  Current packs/day: 0.00    Average packs/day: 1 pack/day for 40.0 years (40.0 ttl pk-yrs)    Types: Cigarettes    Start date: 12/21/1970    Quit date: 12/21/2010    Years since quitting: 13.7   Smokeless tobacco: Never  Vaping Use   Vaping status: Former   Devices: 2012-2014 while quitting cigaretter  Substance and Sexual Activity   Alcohol use: Not Currently    Alcohol/week: 1.0 - 2.0 standard drink of alcohol    Types: 1 - 2 Standard drinks or equivalent per week    Comment: patient staets she stopped drinking 3 years ago (2022)   Drug use: Yes    Frequency: 3.0 times per week    Types: Hydrocodone    Sexual activity: Not Currently    Birth control/protection: Surgical, None  Other Topics Concern   Not on file  Social History Narrative   Lives with her husband. She has two daughters and one son. She enjoys gardening and reading.    Social Drivers of Corporate investment banker Strain: Low  Risk  (07/04/2024)   Overall Financial Resource Strain (CARDIA)    Difficulty of Paying Living Expenses: Not hard at all  Food Insecurity: No Food Insecurity (07/04/2024)   Hunger Vital Sign    Worried About Running Out of Food in the Last Year: Never true    Ran Out of Food in the Last Year: Never true  Transportation Needs: No Transportation Needs (07/04/2024)   PRAPARE - Administrator, Civil Service (Medical): No    Lack of Transportation (Non-Medical): No  Physical Activity: Insufficiently Active (07/04/2024)   Exercise Vital Sign    Days of Exercise per Week: 2 days    Minutes of Exercise per Session: 60 min  Stress: No Stress Concern Present (07/04/2024)   [employer] of Occupational Health - Occupational Stress Questionnaire    Feeling of Stress: Only a little  Social Connections: Moderately Isolated (07/04/2024)   Social Connection and Isolation Panel    Frequency of Communication with Friends and Family: Twice a week    Frequency of Social Gatherings with Friends and Family: Once a week    Attends Religious Services: Never    Database administrator or Organizations: No    Attends Engineer, structural: Not on file    Marital Status: Married    Family History  Problem Relation Age of Onset   Depression Mother    Hyperlipidemia Mother    Hypertension Mother    Colon polyps Mother    Kidney disease Mother    Obesity Mother    Heart attack Father    Hyperlipidemia Father    Hypertension Father    Colon polyps Father    Alcohol abuse Sister    Depression Sister    Alcohol abuse Sister    Depression Sister    Drug abuse Sister    Alcohol abuse Sister    Drug abuse Sister    Early death Sister    Stomach cancer Brother    Cancer Brother    Alcohol abuse Son    Drug abuse Son    Alcohol abuse Son    Drug abuse Son    Colon cancer Neg Hx    Esophageal cancer Neg Hx    Rectal cancer Neg Hx    Breast cancer Neg Hx    BRCA 1/2 Neg Hx      Health Maintenance  Topic Date Due   Lung  Cancer Screening  08/23/2021   OPHTHALMOLOGY EXAM  03/24/2024   Influenza Vaccine  07/21/2024   COVID-19 Vaccine (8 - 2025-26 season) 08/21/2024   Diabetic kidney evaluation - Urine ACR  12/28/2024   HEMOGLOBIN A1C  01/11/2025   Medicare Annual Wellness (AWV)  04/06/2025   FOOT EXAM  07/11/2025   Diabetic kidney evaluation - eGFR measurement  07/24/2025   Mammogram  07/26/2026   Colonoscopy  01/22/2028   DTaP/Tdap/Td (2 - Td or Tdap) 05/22/2032   Pneumococcal Vaccine: 50+ Years  Completed   DEXA SCAN  Completed   Hepatitis C Screening  Completed   Zoster Vaccines- Shingrix   Completed   Meningococcal B Vaccine  Aged Out     ----------------------------------------------------------------------------------------------------------------------------------------------------------------------------------------------------------------- Physical Exam There were no vitals taken for this visit.  Physical Exam Constitutional:      Appearance: Normal appearance.  Eyes:     General: No scleral icterus. Cardiovascular:     Rate and Rhythm: Normal rate and regular rhythm.  Pulmonary:     Effort: Pulmonary effort is normal.     Breath sounds: Normal breath sounds.  Neurological:     General: No focal deficit present.     Mental Status: She is alert.  Psychiatric:        Mood and Affect: Mood normal.        Behavior: Behavior normal.     ------------------------------------------------------------------------------------------------------------------------------------------------------------------------------------------------------------------- Assessment and Plan  Chronic kidney disease, stage 3a (HCC) She does not have an appointment with nephrology until next month.  Updating renal function.  Type 2 diabetes mellitus with diabetic chronic kidney disease (HCC) Updating A1c today.  Overall she is doing well with tirzepatide  and will  continue at current strength.  Essential hypertension Blood pressure overall is stable.  Will plan to continue current medications.  Other fatigue Orders Placed This Encounter  Procedures   CMP14+EGFR   CBC with Differential/Platelet   TSH   Ferritin   HgB A1c   B12   Vitamin D  (25 hydroxy)      No orders of the defined types were placed in this encounter.   No follow-ups on file.

## 2024-09-27 NOTE — Assessment & Plan Note (Addendum)
 Updating A1c today.  Overall she is doing well with tirzepatide  and will continue at current strength.

## 2024-09-27 NOTE — Assessment & Plan Note (Signed)
 Blood pressure overall is stable.  Will plan to continue current medications.

## 2024-09-27 NOTE — Assessment & Plan Note (Signed)
 She does not have an appointment with nephrology until next month.  Updating renal function.

## 2024-09-28 LAB — CBC WITH DIFFERENTIAL/PLATELET
Basophils Absolute: 0.1 x10E3/uL (ref 0.0–0.2)
Basos: 1 %
EOS (ABSOLUTE): 0.1 x10E3/uL (ref 0.0–0.4)
Eos: 2 %
Hematocrit: 38.3 % (ref 34.0–46.6)
Hemoglobin: 12.3 g/dL (ref 11.1–15.9)
Immature Grans (Abs): 0 x10E3/uL (ref 0.0–0.1)
Immature Granulocytes: 0 %
Lymphocytes Absolute: 1.5 x10E3/uL (ref 0.7–3.1)
Lymphs: 22 %
MCH: 29.9 pg (ref 26.6–33.0)
MCHC: 32.1 g/dL (ref 31.5–35.7)
MCV: 93 fL (ref 79–97)
Monocytes Absolute: 0.5 x10E3/uL (ref 0.1–0.9)
Monocytes: 8 %
Neutrophils Absolute: 4.4 x10E3/uL (ref 1.4–7.0)
Neutrophils: 67 %
Platelets: 331 x10E3/uL (ref 150–450)
RBC: 4.11 x10E6/uL (ref 3.77–5.28)
RDW: 12.7 % (ref 11.7–15.4)
WBC: 6.6 x10E3/uL (ref 3.4–10.8)

## 2024-09-28 LAB — VITAMIN D 25 HYDROXY (VIT D DEFICIENCY, FRACTURES): Vit D, 25-Hydroxy: 63.1 ng/mL (ref 30.0–100.0)

## 2024-09-28 LAB — CMP14+EGFR
ALT: 16 IU/L (ref 0–32)
AST: 17 IU/L (ref 0–40)
Albumin: 4.1 g/dL (ref 3.8–4.8)
Alkaline Phosphatase: 75 IU/L (ref 49–135)
BUN/Creatinine Ratio: 16 (ref 12–28)
BUN: 24 mg/dL (ref 8–27)
Bilirubin Total: 0.6 mg/dL (ref 0.0–1.2)
CO2: 23 mmol/L (ref 20–29)
Calcium: 9.9 mg/dL (ref 8.7–10.3)
Chloride: 100 mmol/L (ref 96–106)
Creatinine, Ser: 1.46 mg/dL — ABNORMAL HIGH (ref 0.57–1.00)
Globulin, Total: 2.4 g/dL (ref 1.5–4.5)
Glucose: 88 mg/dL (ref 70–99)
Potassium: 4.2 mmol/L (ref 3.5–5.2)
Sodium: 137 mmol/L (ref 134–144)
Total Protein: 6.5 g/dL (ref 6.0–8.5)
eGFR: 38 mL/min/1.73 — ABNORMAL LOW (ref >59)

## 2024-09-28 LAB — HEMOGLOBIN A1C
Est. average glucose Bld gHb Est-mCnc: 105 mg/dL
Hgb A1c MFr Bld: 5.3 % (ref 4.8–5.6)

## 2024-09-28 LAB — FERRITIN: Ferritin: 176 ng/mL — ABNORMAL HIGH (ref 15–150)

## 2024-09-28 LAB — TSH: TSH: 3.15 u[IU]/mL (ref 0.450–4.500)

## 2024-09-28 LAB — VITAMIN B12: Vitamin B-12: 1346 pg/mL — ABNORMAL HIGH (ref 232–1245)

## 2024-09-28 NOTE — Addendum Note (Signed)
 Addended by: Brevan Luberto Z on: 09/28/2024 01:00 PM   Modules accepted: Orders

## 2024-09-29 ENCOUNTER — Ambulatory Visit: Payer: Self-pay | Admitting: Family Medicine

## 2024-10-13 ENCOUNTER — Other Ambulatory Visit: Payer: Self-pay | Admitting: Cardiology

## 2024-10-13 DIAGNOSIS — E7800 Pure hypercholesterolemia, unspecified: Secondary | ICD-10-CM

## 2024-10-13 DIAGNOSIS — I2510 Atherosclerotic heart disease of native coronary artery without angina pectoris: Secondary | ICD-10-CM

## 2024-11-08 NOTE — Progress Notes (Signed)
 HPI: FU coronary artery disease. Cardiac catheterization September 2020 showed less than 20% LAD and otherwise normal coronary arteries; normal LVEDP. Echocardiogram October 2023 showed normal LV function. Also noted to have aortic atherosclerosis on CT scan.  Since last seen she has lost 70 pounds.  Her blood pressure is now running low and she has some orthostatic symptoms.  She denies chest pain.  Her dyspnea has resolved.  No syncope.  Current Outpatient Medications  Medication Sig Dispense Refill   ALPRAZolam  (XANAX ) 0.25 MG tablet Take 1-2 tablets (0.25-0.5 mg total) by mouth 2 (two) times daily as needed for anxiety or sleep. 30 tablet 3   aspirin  81 MG chewable tablet Chew by mouth daily.     atorvastatin  (LIPITOR) 80 MG tablet Take 1 tablet (80 mg total) by mouth daily. 90 tablet 1   buPROPion  (WELLBUTRIN  XL) 150 MG 24 hr tablet Take 1 tablet (150 mg total) by mouth every morning. 90 tablet 1   Cholecalciferol  (VITAMIN D3) 50 MCG (2000 UT) TABS Take 2,000 Units by mouth daily. 90 tablet 3   dapagliflozin  propanediol (FARXIGA ) 10 MG TABS tablet Take 1 tablet (10 mg total) by mouth daily before breakfast. 90 tablet 3   enalapril  (VASOTEC ) 5 MG tablet Take 1 tablet (5 mg total) by mouth daily. 90 tablet 1   estradiol (ESTRACE) 0.1 MG/GM vaginal cream Place 1 Applicatorful vaginally.     ezetimibe  (ZETIA ) 10 MG tablet Take 1 tablet (10 mg total) by mouth daily. PATIENT NEEDS APPOINTMENT WITH CARDIOLOGIST FOR FUTURE REFILLS 90 tablet 0   fluticasone  (FLONASE ) 50 MCG/ACT nasal spray Place 2 sprays into both nostrils daily as needed for allergies or rhinitis.      HYDROcodone -acetaminophen  (NORCO/VICODIN) 5-325 MG tablet Take 1 tablet by mouth every 8 (eight) hours as needed for moderate pain (pain score 4-6) or severe pain (pain score 7-10). 60 tablet 0   Magnesium 125 MG CAPS      Multiple Vitamin tablet Take 1 tablet by mouth daily.      tirzepatide  (MOUNJARO ) 10 MG/0.5ML Pen Inject 10  mg into the skin once a week. 6 mL 1   traZODone  (DESYREL ) 50 MG tablet TAKE 1 TABLET AT BEDTIME 90 tablet 2   triamterene -hydrochlorothiazide  (MAXZIDE -25) 37.5-25 MG tablet TAKE 1 TABLET DAILY 90 tablet 1   venlafaxine  XR (EFFEXOR -XR) 75 MG 24 hr capsule Take 75 mg by mouth daily.     blood glucose meter kit and supplies KIT Check morning fasting blood glucose and up to four times daily as directed. 1 each 0   Lancets (ONETOUCH DELICA PLUS LANCET33G) MISC USE AS DIRECTED UP TO 4    TIMES DAILY 100 each 14   ONETOUCH VERIO test strip USE UP TO 4 TIMES A DAY AS DIRECTED WITH GLUCOMETER 100 strip 14   triamcinolone  cream (KENALOG ) 0.1 % APPLY TO AFFECTED AREA TWICE A DAY     No current facility-administered medications for this visit.     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   Chronic low back pain    Colon polyps    Depression    Diabetes mellitus without complication (HCC)    Fatty liver  Hypertension    Melanoma (HCC)    R upper extremity   Melanoma in situ of right upper extremity (HCC)    Mild diastolic dysfunction 07/09/2019   Osteoarthritis of lumbar spine    Renal cancer (HCC)    Skin cancer    Sleep apnea 2008   cpap nightly    Past Surgical History:  Procedure Laterality Date   ABDOMINAL HYSTERECTOMY     ANKLE SURGERY Left    in her 30s   BILE DUCT STENT PLACEMENT     CARDIAC CATHETERIZATION     CHOLECYSTECTOMY     COLONOSCOPY     FRACTURE SURGERY  2005   Back, Ankle in 1980's   JOINT REPLACEMENT  2021   Left Hip   LEFT HEART CATH AND CORONARY ANGIOGRAPHY N/A 09/05/2019   Procedure: LEFT HEART CATH AND CORONARY ANGIOGRAPHY;  Surgeon: Anner Alm ORN, MD;  Location: [facility] INVASIVE CV LAB;  Service:  Cardiovascular;  Laterality: N/A;   LUMBAR FUSION     7 level    MOHS SURGERY     x 11   PARTIAL NEPHRECTOMY     POLYPECTOMY     SPINE SURGERY  2005   7 fused vertebra   TOTAL HIP ARTHROPLASTY Left 03/01/2020   Procedure: LEFT TOTAL HIP ARTHROPLASTY ANTERIOR APPROACH;  Surgeon: Vernetta Lonni GRADE, MD;  Location: [facility];  Service: Orthopedics;  Laterality: Left;   UPPER GASTROINTESTINAL ENDOSCOPY      Social History   Socioeconomic History   Marital status: Married    Spouse name: Darrell   Number of children: 3   Years of education: 12   Highest education level: Some college, no degree  Occupational History   Occupation: Retired  Tobacco Use   Smoking status: Former    Current packs/day: 0.00    Average packs/day: 1 pack/day for 40.0 years (40.0 ttl pk-yrs)    Types: Cigarettes    Start date: 12/21/1970    Quit date: 12/21/2010    Years since quitting: 13.9   Smokeless tobacco: Never  Vaping Use   Vaping status: Former   Devices: 2012-2014 while quitting cigaretter  Substance and Sexual Activity   Alcohol use: Not Currently    Alcohol/week: 1.0 - 2.0 standard drink of alcohol    Types: 1 - 2 Standard drinks or equivalent per week    Comment: patient staets she stopped drinking 3 years ago (2022)   Drug use: Yes    Frequency: 3.0 times per week    Types: Hydrocodone    Sexual activity: Not Currently    Birth control/protection: Surgical, None  Other Topics Concern   Not on file  Social History Narrative   Lives with her husband. She has two daughters and one son. She enjoys gardening and reading.    Social Drivers of Corporate Investment Banker Strain: Low Risk  (07/04/2024)   Overall Financial Resource Strain (CARDIA)    Difficulty of Paying Living Expenses: Not hard at all  Food Insecurity: No Food Insecurity (07/04/2024)   Hunger Vital Sign    Worried About Running Out of Food in the Last Year: Never true    Ran Out of Food in the Last Year: Never true   Transportation Needs: No Transportation Needs (07/04/2024)   PRAPARE - Administrator, Civil Service (Medical): No    Lack of Transportation (Non-Medical): No  Physical Activity: Insufficiently Active (07/04/2024)   Exercise Vital Sign    Days of Exercise per  Week: 2 days    Minutes of Exercise per Session: 60 min  Stress: No Stress Concern Present (07/04/2024)   [employer] of Occupational Health - Occupational Stress Questionnaire    Feeling of Stress: Only a little  Social Connections: Moderately Isolated (07/04/2024)   Social Connection and Isolation Panel    Frequency of Communication with Friends and Family: Twice a week    Frequency of Social Gatherings with Friends and Family: Once a week    Attends Religious Services: Never    Database Administrator or Organizations: No    Attends Engineer, Structural: Not on file    Marital Status: Married  Catering Manager Violence: Not At Risk (06/12/2024)   Received from [facility]   HITS    Over the last 12 months how often did your partner physically hurt you?: Never    Over the last 12 months how often did your partner insult you or talk down to you?: Never    Over the last 12 months how often did your partner threaten you with physical harm?: Never    Over the last 12 months how often did your partner scream or curse at you?: Never    Family History  Problem Relation Age of Onset   Depression Mother    Hyperlipidemia Mother    Hypertension Mother    Colon polyps Mother    Kidney disease Mother    Obesity Mother    Heart attack Father    Hyperlipidemia Father    Hypertension Father    Colon polyps Father    Alcohol abuse Sister    Depression Sister    Alcohol abuse Sister    Depression Sister    Drug abuse Sister    Alcohol abuse Sister    Drug abuse Sister    Early death Sister    Stomach cancer Brother    Cancer Brother    Alcohol abuse Son    Drug abuse Son    Alcohol abuse Son    Drug  abuse Son    Colon cancer Neg Hx    Esophageal cancer Neg Hx    Rectal cancer Neg Hx    Breast cancer Neg Hx    BRCA 1/2 Neg Hx     ROS: no fevers or chills, productive cough, hemoptysis, dysphasia, odynophagia, melena, hematochezia, dysuria, hematuria, rash, seizure activity, orthopnea, PND, pedal edema, claudication. Remaining systems are negative.  Physical Exam: Well-developed well-nourished in no acute distress.  Skin is warm and dry.  HEENT is normal.  Neck is supple.  Chest is clear to auscultation with normal expansion.  Cardiovascular exam is regular rate and rhythm.  Abdominal exam nontender or distended. No masses palpated. Extremities show no edema. neuro grossly intact  EKG Interpretation Date/Time:  Wednesday November 15 2024 08:37:08 EST Ventricular Rate:  107 PR Interval:  162 QRS Duration:  148 QT Interval:  374 QTC Calculation: 499 R Axis:   -82  Text Interpretation: Sinus tachycardia Left axis deviation Right bundle branch block Confirmed by Pietro Rogue ([id number]) on 11/15/2024 8:37:52 AM    A/P  1 coronary artery disease-minimal on previous catheterization.  She denies chest pain.  Continue aspirin  and statin.  2 hypertension-patient's blood pressure is now low after losing 70 pounds.  She has some orthostatic symptoms.  Will discontinue triamterene  hydrochlorothiazide .  If she develops edema then we will resume half dose and discontinue enalapril .  She will follow her blood pressure at home and we  will adjust medications as needed.  3 hyperlipidemia-continue Lipitor and Zetia .  4 history of palpitations-no recent symptoms.  5 history of dyspnea-previously felt to be multifactorial including obesity hypoventilation syndrome, deconditioning and obstructive sleep apnea.  Minimal coronary disease noted at time of previous catheterization with normal LVEDP.  Her symptoms have now resolved after losing 70 pounds.  Redell Shallow, MD

## 2024-11-10 ENCOUNTER — Encounter: Payer: Self-pay | Admitting: Family Medicine

## 2024-11-10 DIAGNOSIS — M47816 Spondylosis without myelopathy or radiculopathy, lumbar region: Secondary | ICD-10-CM

## 2024-11-10 DIAGNOSIS — F419 Anxiety disorder, unspecified: Secondary | ICD-10-CM

## 2024-11-10 MED ORDER — ALPRAZOLAM 0.25 MG PO TABS: 0.2500 mg | ORAL_TABLET | Freq: Two times a day (BID) | ORAL | 3 refills | Status: AC | PRN

## 2024-11-10 MED ORDER — HYDROCODONE-ACETAMINOPHEN 5-325 MG PO TABS: 1.0000 | ORAL_TABLET | Freq: Three times a day (TID) | ORAL | 0 refills | Status: AC | PRN

## 2024-11-10 MED ORDER — DAPAGLIFLOZIN PROPANEDIOL 10 MG PO TABS: 10.0000 mg | ORAL_TABLET | Freq: Every day | ORAL | 3 refills | Status: AC

## 2024-11-15 ENCOUNTER — Ambulatory Visit: Attending: Cardiology | Admitting: Cardiology

## 2024-11-15 ENCOUNTER — Encounter: Payer: Self-pay | Admitting: Cardiology

## 2024-11-15 VITALS — BP 99/65 | HR 107 | Ht 69.0 in | Wt 234.0 lb

## 2024-11-15 DIAGNOSIS — E7800 Pure hypercholesterolemia, unspecified: Secondary | ICD-10-CM | POA: Insufficient documentation

## 2024-11-15 DIAGNOSIS — I10 Essential (primary) hypertension: Secondary | ICD-10-CM | POA: Diagnosis present

## 2024-11-15 DIAGNOSIS — I2510 Atherosclerotic heart disease of native coronary artery without angina pectoris: Secondary | ICD-10-CM | POA: Insufficient documentation

## 2024-11-15 DIAGNOSIS — R002 Palpitations: Secondary | ICD-10-CM | POA: Diagnosis not present

## 2024-11-15 NOTE — Patient Instructions (Signed)
 Medication Instructions:   STOP MAXZIDE   *If you need a refill on your cardiac medications before your next appointment, please call your pharmacy*  Follow-Up: At [facility], you and your health needs are our priority.  As part of our continuing mission to provide you with exceptional heart care, our providers are all part of one team.  This team includes your primary Cardiologist (physician) and Advanced Practice Providers or APPs (Physician Assistants and Nurse Practitioners) who all work together to provide you with the care you need, when you need it.  Your next appointment:   12 month(s)  Provider:   REDELL SHALLOW MD

## 2024-11-18 ENCOUNTER — Other Ambulatory Visit: Payer: Self-pay | Admitting: Family Medicine

## 2024-11-18 DIAGNOSIS — I10 Essential (primary) hypertension: Secondary | ICD-10-CM

## 2024-11-21 DIAGNOSIS — H02883 Meibomian gland dysfunction of right eye, unspecified eyelid: Secondary | ICD-10-CM | POA: Insufficient documentation

## 2024-11-28 ENCOUNTER — Other Ambulatory Visit: Payer: Self-pay | Admitting: Family Medicine

## 2024-11-28 DIAGNOSIS — I10 Essential (primary) hypertension: Secondary | ICD-10-CM

## 2024-11-28 NOTE — Telephone Encounter (Signed)
 Copied from CRM #[id number]. Topic: Clinical - Medication Refill >> Nov 28, 2024  5:25 PM Carla L wrote: Medication: enalapril  (VASOTEC ) 10 MG tablet  Has the patient contacted their pharmacy? Yes Pharmacy calling on behalf of patient. Callback number: [phone] option 2  Reference number: [id number]  This is the patient's preferred pharmacy:  [facility] - [place], [place] - [facility] AT Portal to Registered [facility] [place] [address] Phone: [phone] Fax: [phone]  Is this the correct pharmacy for this prescription? Yes  Has the prescription been filled recently? No  Is the patient out of the medication? No  Has the patient been seen for an appointment in the last year OR does the patient have an upcoming appointment? Yes  Can we respond through MyChart? unsure  Agent: Please be advised that Rx refills may take up to 3 business days. We ask that you follow-up with your pharmacy.

## 2024-11-29 MED ORDER — ENALAPRIL MALEATE 5 MG PO TABS: 5.0000 mg | ORAL_TABLET | Freq: Every day | ORAL | 1 refills | Status: DC

## 2024-12-31 ENCOUNTER — Other Ambulatory Visit: Payer: Self-pay | Admitting: Family Medicine

## 2024-12-31 DIAGNOSIS — F331 Major depressive disorder, recurrent, moderate: Secondary | ICD-10-CM

## 2025-01-01 ENCOUNTER — Encounter: Payer: Self-pay | Admitting: Family Medicine

## 2025-01-01 DIAGNOSIS — F331 Major depressive disorder, recurrent, moderate: Secondary | ICD-10-CM

## 2025-01-01 MED ORDER — VENLAFAXINE HCL ER 75 MG PO CP24: 75.0000 mg | ORAL_CAPSULE | Freq: Every day | ORAL | 3 refills | Status: DC

## 2025-01-01 NOTE — Telephone Encounter (Signed)
 Medication has never been filled by PCP. Please advise.

## 2025-01-03 ENCOUNTER — Telehealth: Payer: Self-pay

## 2025-01-03 MED ORDER — VENLAFAXINE HCL ER 37.5 MG PO CP24: ORAL_CAPSULE | ORAL | 1 refills | Status: DC

## 2025-01-03 NOTE — Telephone Encounter (Signed)
 Copied from CRM [id number]. Topic: Clinical - Prescription Issue >> Jan 02, 2025  5:10 PM Sophia H wrote: Reason for CRM: Patient states pharmacy refilled old RX for venlafaxine  XR (EFFEXOR -XR). Needing 90 day supply sent in for the 37.5 MG, states dose was lowered by Dr. Alvia at last office visit.   [facility] - [place], [place] - [address] [facility] AT Portal to Registered [employer]

## 2025-01-03 NOTE — Addendum Note (Signed)
 Addended by: Kresta Templeman E on: 01/03/2025 04:24 PM   Modules accepted: Orders

## 2025-01-04 ENCOUNTER — Other Ambulatory Visit: Payer: Self-pay | Admitting: Family Medicine

## 2025-01-05 ENCOUNTER — Other Ambulatory Visit: Payer: Self-pay | Admitting: Family Medicine

## 2025-01-05 DIAGNOSIS — I10 Essential (primary) hypertension: Secondary | ICD-10-CM

## 2025-01-08 ENCOUNTER — Other Ambulatory Visit: Payer: Self-pay

## 2025-01-08 ENCOUNTER — Other Ambulatory Visit: Payer: Self-pay | Admitting: Cardiology

## 2025-01-08 DIAGNOSIS — I2510 Atherosclerotic heart disease of native coronary artery without angina pectoris: Secondary | ICD-10-CM

## 2025-01-08 DIAGNOSIS — E7800 Pure hypercholesterolemia, unspecified: Secondary | ICD-10-CM

## 2025-01-08 MED ORDER — VENLAFAXINE HCL ER 37.5 MG PO CP24: 37.5000 mg | ORAL_CAPSULE | Freq: Every day | ORAL | 1 refills | Status: AC

## 2025-01-11 ENCOUNTER — Ambulatory Visit: Admitting: Family Medicine

## 2025-01-11 ENCOUNTER — Encounter: Payer: Self-pay | Admitting: Family Medicine

## 2025-01-11 VITALS — BP 100/66 | HR 73 | Ht 69.0 in | Wt 224.0 lb

## 2025-01-11 DIAGNOSIS — I10 Essential (primary) hypertension: Secondary | ICD-10-CM | POA: Diagnosis not present

## 2025-01-11 DIAGNOSIS — F419 Anxiety disorder, unspecified: Secondary | ICD-10-CM

## 2025-01-11 DIAGNOSIS — F331 Major depressive disorder, recurrent, moderate: Secondary | ICD-10-CM

## 2025-01-11 DIAGNOSIS — E1122 Type 2 diabetes mellitus with diabetic chronic kidney disease: Secondary | ICD-10-CM | POA: Diagnosis not present

## 2025-01-11 DIAGNOSIS — Z9889 Other specified postprocedural states: Secondary | ICD-10-CM

## 2025-01-11 DIAGNOSIS — F5105 Insomnia due to other mental disorder: Secondary | ICD-10-CM | POA: Diagnosis not present

## 2025-01-11 DIAGNOSIS — Z7984 Long term (current) use of oral hypoglycemic drugs: Secondary | ICD-10-CM | POA: Diagnosis not present

## 2025-01-11 DIAGNOSIS — M47816 Spondylosis without myelopathy or radiculopathy, lumbar region: Secondary | ICD-10-CM | POA: Diagnosis not present

## 2025-01-11 DIAGNOSIS — M79676 Pain in unspecified toe(s): Secondary | ICD-10-CM

## 2025-01-11 DIAGNOSIS — N1831 Chronic kidney disease, stage 3a: Secondary | ICD-10-CM | POA: Diagnosis not present

## 2025-01-11 LAB — POCT GLYCOSYLATED HEMOGLOBIN (HGB A1C): HbA1c POC (<> result, manual entry): 5.1 %

## 2025-01-11 LAB — POCT UA - MICROALBUMIN
Albumin/Creatinine Ratio, Urine, POC: <30
Creatinine, POC: 200 mg/dL
Microalbumin Ur, POC: 30 mg/L

## 2025-01-11 NOTE — Assessment & Plan Note (Signed)
 Blood pressure overall is stable.  Will plan to continue current medications.

## 2025-01-11 NOTE — Progress Notes (Signed)
 " Bonnie [name] - 74 y.o. female MRN [id number]  Date of birth: 12/31/1950  Subjective Chief Complaint  Patient presents with   Diabetes   Mood   Body mass index is 33.08 kg/m.  HPI Bonnie [name] is a 74 y.o. female here today for follow up visit.   She reports that she is doing ok. .   She continues on mounjaro  for management of diabetes.  Also remains on farxiga  for T2DM and CKD.  Doing well with medications.  A1c today is 5.1%.  She has had significant weight loss with mounjaro .   BP is treated with enalapril  and maxzide .  Mood is stable.  Remains on Effexor  and bupropion  with alprazolam  as needed.  Has trazodone  as well for insomnia.  Medications are   She notes some worsening of back pain with weight loss.  No radiation of symptoms.  Has tried PT multiple times but this makes pain worse.  Requesting referral to new spine surgeon due to continued symptoms.   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   Chronic low back pain    Colon polyps    Depression    Diabetes mellitus without complication (HCC)    Fatty liver    Hypertension    Melanoma (HCC)    R upper extremity   Melanoma in situ of right upper extremity (HCC)    Mild diastolic dysfunction 07/09/2019   Osteoarthritis of lumbar spine    Renal cancer (HCC)    Skin cancer    Sleep apnea 2008   cpap nightly    Past Surgical History:  Procedure Laterality Date   ABDOMINAL HYSTERECTOMY     ANKLE SURGERY Left    in her 30s   BILE DUCT STENT PLACEMENT     CARDIAC CATHETERIZATION     CHOLECYSTECTOMY     COLONOSCOPY     FRACTURE SURGERY  2005   Back, Ankle in 1980's    JOINT REPLACEMENT  2021   Left Hip   LEFT HEART CATH AND CORONARY ANGIOGRAPHY N/A 09/05/2019   Procedure: LEFT HEART CATH AND CORONARY ANGIOGRAPHY;  Surgeon: Anner Alm ORN, MD;  Location: [facility] INVASIVE CV LAB;  Service: Cardiovascular;  Laterality: N/A;   LUMBAR FUSION     7 level    MOHS SURGERY     x 11   PARTIAL NEPHRECTOMY     POLYPECTOMY     SPINE SURGERY  2005   7 fused vertebra   TOTAL HIP ARTHROPLASTY Left 03/01/2020   Procedure: LEFT TOTAL HIP ARTHROPLASTY ANTERIOR APPROACH;  Surgeon: Vernetta Lonni GRADE, MD;  Location: [facility];  Service: Orthopedics;  Laterality: Left;   UPPER GASTROINTESTINAL ENDOSCOPY      Social History   Socioeconomic History   Marital status: Married    Spouse name: Darrell   Number of children: 3   Years of education: 12   Highest education level: Some college, no degree  Occupational History   Occupation: Retired  Tobacco Use   Smoking status: Former    Current packs/day: 0.00    Average packs/day: 1 pack/day for  40.0 years (40.0 ttl pk-yrs)    Types: Cigarettes    Start date: 12/21/1970    Quit date: 12/21/2010    Years since quitting: 14.0   Smokeless tobacco: Never  Vaping Use   Vaping status: Former   Devices: 2012-2014 while quitting cigaretter  Substance and Sexual Activity   Alcohol use: Not Currently    Alcohol/week: 1.0 - 2.0 standard drink of alcohol    Types: 1 - 2 Standard drinks or equivalent per week    Comment: patient staets she stopped drinking 3 years ago (2022)   Drug use: Yes    Frequency: 3.0 times per week    Types: Hydrocodone    Sexual activity: Not Currently    Birth control/protection: Surgical, None  Other Topics Concern   Not on file  Social History Narrative   Lives with her husband. She has two daughters and one son. She enjoys gardening and reading.    Social Drivers of Health   Tobacco Use: Medium Risk (01/11/2025)   Patient History    Smoking Tobacco Use: Former    Smokeless Tobacco Use:  Never    Passive Exposure: Not on file  Financial Resource Strain: Low Risk (01/06/2025)   Overall Financial Resource Strain (CARDIA)    Difficulty of Paying Living Expenses: Not hard at all  Food Insecurity: No Food Insecurity (01/06/2025)   Epic    Worried About Programme Researcher, Broadcasting/film/video in the Last Year: Never true    Ran Out of Food in the Last Year: Never true  Transportation Needs: No Transportation Needs (01/06/2025)   Epic    Lack of Transportation (Medical): No    Lack of Transportation (Non-Medical): No  Physical Activity: Insufficiently Active (01/06/2025)   Exercise Vital Sign    Days of Exercise per Week: 1 day    Minutes of Exercise per Session: 20 min  Stress: No Stress Concern Present (01/06/2025)   [employer] of Occupational Health - Occupational Stress Questionnaire    Feeling of Stress: Only a little  Social Connections: Moderately Isolated (01/06/2025)   Social Connection and Isolation Panel    Frequency of Communication with Friends and Family: Twice a week    Frequency of Social Gatherings with Friends and Family: Once a week    Attends Religious Services: Patient declined    Active Member of Clubs or Organizations: No    Attends Engineer, Structural: Not on file    Marital Status: Married  Depression (PHQ2-9): Low Risk (07/11/2024)   Depression (PHQ2-9)    PHQ-2 Score: 0  Alcohol Screen: Low Risk (04/06/2024)   Alcohol Screen    Last Alcohol Screening Score (AUDIT): 0  Housing: Low Risk (01/06/2025)   Epic    Unable to Pay for Housing in the Last Year: No    Number of Times Moved in the Last Year: 0    Homeless in the Last Year: No  Utilities: Not At Risk (06/12/2024)   Received from [facility] Utilities    Threatened with loss of utilities: No  Health Literacy: Adequate Health Literacy (04/06/2024)   B1300 Health Literacy    Frequency of need for help with medical instructions: Never    Family History  Problem Relation Age of Onset    Depression Mother    Hyperlipidemia Mother    Hypertension Mother    Colon polyps Mother    Kidney disease Mother    Obesity Mother    Heart attack Father  Hyperlipidemia Father    Hypertension Father    Colon polyps Father    Alcohol abuse Sister    Depression Sister    Alcohol abuse Sister    Depression Sister    Drug abuse Sister    Alcohol abuse Sister    Drug abuse Sister    Early death Sister    Stomach cancer Brother    Cancer Brother    Alcohol abuse Son    Drug abuse Son    Alcohol abuse Son    Drug abuse Son    Colon cancer Neg Hx    Esophageal cancer Neg Hx    Rectal cancer Neg Hx    Breast cancer Neg Hx    BRCA 1/2 Neg Hx     Health Maintenance  Topic Date Due   Lung Cancer Screening  08/23/2021   OPHTHALMOLOGY EXAM  03/24/2024   Diabetic kidney evaluation - Urine ACR  12/28/2024   COVID-19 Vaccine (9 - Moderna risk 2025-26 season) 03/08/2025   Medicare Annual Wellness (AWV)  04/06/2025   FOOT EXAM  07/11/2025   HEMOGLOBIN A1C  07/11/2025   Diabetic kidney evaluation - eGFR measurement  09/27/2025   Mammogram  07/26/2026   Colonoscopy  01/22/2028   DTaP/Tdap/Td (2 - Td or Tdap) 05/22/2032   Pneumococcal Vaccine: 50+ Years  Completed   Influenza Vaccine  Completed   Bone Density Scan  Completed   Hepatitis C Screening  Completed   Zoster Vaccines- Shingrix   Completed   Meningococcal B Vaccine  Aged Out     ----------------------------------------------------------------------------------------------------------------------------------------------------------------------------------------------------------------- Physical Exam BP 100/66   Pulse 73   Ht 5' 9 (1.753 m)   Wt 224 lb (101.6 kg)   SpO2 97%   BMI 33.08 kg/m   Physical Exam Constitutional:      Appearance: Normal appearance.  Eyes:     General: No scleral icterus. Cardiovascular:     Rate and Rhythm: Normal rate and regular rhythm.  Pulmonary:     Effort: Pulmonary effort is  normal.     Breath sounds: Normal breath sounds.  Musculoskeletal:     Cervical back: Neck supple.  Neurological:     Mental Status: She is alert.  Psychiatric:        Mood and Affect: Mood normal.        Behavior: Behavior normal.     ------------------------------------------------------------------------------------------------------------------------------------------------------------------------------------------------------------------- Assessment and Plan  Essential hypertension Blood pressure overall is stable.  Will plan to continue current medications.  Spondylosis of lumbar region without myelopathy or radiculopathy Continues on Norco as needed.  Would like to have 2nd opinion regarding additional treatment with another neurosurgery clinic.  Referral entered.   Type 2 diabetes mellitus with diabetic chronic kidney disease (HCC) Updating A1c today.  Overall she is doing well with tirzepatide  and will continue at current strength.  Renal function stable.  Updated microalbumin today.   Depression, major Feels like depression is doing better.  Doing well with Effexor  at 37.5mg  daily.  Continue bupropion  at current strength.  Insomnia secondary to anxiety Continue trazodone  at current strength.  Also has alprazolam  to use occasionally if needed.   No orders of the defined types were placed in this encounter.   Return in about 6 months (around 07/11/2025) for Hypertension, Type 2 Diabetes.        [1]  Allergies Allergen Reactions   Latex Itching and Rash   Metformin  And Related Other (See Comments)    Malaise, fatigue   Morphine And Codeine  Rash and Other (See Comments)    Agitation   Tape Swelling and Other (See Comments)    Burning sensation   Tramadol Palpitations    Heart fluttering   "

## 2025-01-11 NOTE — Assessment & Plan Note (Signed)
 Feels like depression is doing better.  Doing well with Effexor  at 37.5mg  daily.  Continue bupropion  at current strength.

## 2025-01-11 NOTE — Assessment & Plan Note (Signed)
 Updating A1c today.  Overall she is doing well with tirzepatide  and will continue at current strength.  Renal function stable.  Updated microalbumin today.

## 2025-01-11 NOTE — Assessment & Plan Note (Signed)
 Continues on Norco as needed.  Would like to have 2nd opinion regarding additional treatment with another neurosurgery clinic.  Referral entered.

## 2025-01-11 NOTE — Assessment & Plan Note (Signed)
Continue trazodone at current strength.  Also has alprazolam to use occasionally if needed.

## 2025-04-12 ENCOUNTER — Ambulatory Visit

## 2025-06-28 ENCOUNTER — Ambulatory Visit: Admitting: Family Medicine
